# Patient Record
Sex: Female | Born: 1965 | Race: White | Hispanic: No | Marital: Married | Smoking: Current every day smoker
Health system: Southern US, Community
[De-identification: ages and names within clinical notes are randomized; demographics above are authoritative.]

## PROBLEM LIST (undated history)

## (undated) DIAGNOSIS — D123 Benign neoplasm of transverse colon: Secondary | ICD-10-CM

## (undated) DIAGNOSIS — F329 Major depressive disorder, single episode, unspecified: Secondary | ICD-10-CM

## (undated) DIAGNOSIS — Z87442 Personal history of urinary calculi: Secondary | ICD-10-CM

## (undated) DIAGNOSIS — C50511 Malignant neoplasm of lower-outer quadrant of right female breast: Secondary | ICD-10-CM

## (undated) DIAGNOSIS — K219 Gastro-esophageal reflux disease without esophagitis: Secondary | ICD-10-CM

## (undated) DIAGNOSIS — F32A Depression, unspecified: Secondary | ICD-10-CM

## (undated) DIAGNOSIS — Z803 Family history of malignant neoplasm of breast: Secondary | ICD-10-CM

## (undated) DIAGNOSIS — K635 Polyp of colon: Secondary | ICD-10-CM

## (undated) DIAGNOSIS — Z8481 Family history of carrier of genetic disease: Secondary | ICD-10-CM

## (undated) DIAGNOSIS — F17200 Nicotine dependence, unspecified, uncomplicated: Secondary | ICD-10-CM

## (undated) DIAGNOSIS — J302 Other seasonal allergic rhinitis: Secondary | ICD-10-CM

## (undated) DIAGNOSIS — C50919 Malignant neoplasm of unspecified site of unspecified female breast: Secondary | ICD-10-CM

## (undated) DIAGNOSIS — Z800 Family history of malignant neoplasm of digestive organs: Secondary | ICD-10-CM

## (undated) DIAGNOSIS — I10 Essential (primary) hypertension: Secondary | ICD-10-CM

## (undated) DIAGNOSIS — Z801 Family history of malignant neoplasm of trachea, bronchus and lung: Secondary | ICD-10-CM

## (undated) DIAGNOSIS — C4490 Unspecified malignant neoplasm of skin, unspecified: Secondary | ICD-10-CM

## (undated) DIAGNOSIS — D125 Benign neoplasm of sigmoid colon: Secondary | ICD-10-CM

## (undated) DIAGNOSIS — D649 Anemia, unspecified: Secondary | ICD-10-CM

## (undated) DIAGNOSIS — R7303 Prediabetes: Secondary | ICD-10-CM

## (undated) HISTORY — DX: Family history of malignant neoplasm of trachea, bronchus and lung: Z80.1

## (undated) HISTORY — DX: Polyp of colon: K63.5

## (undated) HISTORY — DX: Nicotine dependence, unspecified, uncomplicated: F17.200

## (undated) HISTORY — DX: Benign neoplasm of transverse colon: D12.3

## (undated) HISTORY — DX: Family history of carrier of genetic disease: Z84.81

## (undated) HISTORY — PX: WISDOM TOOTH EXTRACTION: SHX21

## (undated) HISTORY — DX: Family history of malignant neoplasm of breast: Z80.3

## (undated) HISTORY — DX: Family history of malignant neoplasm of digestive organs: Z80.0

## (undated) HISTORY — PX: COSMETIC SURGERY: SHX468

## (undated) HISTORY — DX: Unspecified malignant neoplasm of skin, unspecified: C44.90

## (undated) HISTORY — DX: Malignant neoplasm of unspecified site of unspecified female breast: C50.919

## (undated) HISTORY — DX: Benign neoplasm of sigmoid colon: D12.5

---
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
## 2006-11-20 ENCOUNTER — Ambulatory Visit: Payer: Self-pay | Admitting: Internal Medicine

## 2008-05-29 ENCOUNTER — Ambulatory Visit: Payer: Self-pay | Admitting: Internal Medicine

## 2009-03-04 ENCOUNTER — Ambulatory Visit: Payer: Self-pay | Admitting: Internal Medicine

## 2009-03-09 ENCOUNTER — Ambulatory Visit: Payer: Self-pay | Admitting: Internal Medicine

## 2010-07-28 ENCOUNTER — Ambulatory Visit: Payer: Self-pay | Admitting: Internal Medicine

## 2011-02-06 ENCOUNTER — Ambulatory Visit: Payer: Self-pay

## 2011-08-03 ENCOUNTER — Ambulatory Visit: Payer: Self-pay | Admitting: Internal Medicine

## 2011-08-03 LAB — URINALYSIS, COMPLETE
Bacteria: NEGATIVE
Bilirubin,UR: NEGATIVE
Glucose,UR: NEGATIVE mg/dL (ref 0–75)
Ketone: NEGATIVE
Nitrite: NEGATIVE
Ph: 6 (ref 4.5–8.0)
Protein: NEGATIVE
Specific Gravity: 1.005 (ref 1.003–1.030)

## 2011-08-05 LAB — URINE CULTURE

## 2011-09-07 DIAGNOSIS — Z8742 Personal history of other diseases of the female genital tract: Secondary | ICD-10-CM | POA: Insufficient documentation

## 2011-09-07 DIAGNOSIS — R87610 Atypical squamous cells of undetermined significance on cytologic smear of cervix (ASC-US): Secondary | ICD-10-CM | POA: Insufficient documentation

## 2011-09-07 HISTORY — DX: Personal history of other diseases of the female genital tract: Z87.42

## 2011-10-01 ENCOUNTER — Ambulatory Visit: Payer: Self-pay | Admitting: Internal Medicine

## 2012-07-19 ENCOUNTER — Ambulatory Visit: Payer: Self-pay

## 2012-07-19 LAB — URINALYSIS, COMPLETE
Bacteria: NEGATIVE
Bilirubin,UR: NEGATIVE
Glucose,UR: NEGATIVE mg/dL (ref 0–75)
Ketone: NEGATIVE
Leukocyte Esterase: NEGATIVE
Nitrite: NEGATIVE
Ph: 5 (ref 4.5–8.0)
Protein: NEGATIVE
Specific Gravity: 1.01 (ref 1.003–1.030)

## 2012-07-21 LAB — URINE CULTURE

## 2012-10-12 LAB — BASIC METABOLIC PANEL
BUN: 10 mg/dL (ref 4–21)
Creatinine: 0.8 mg/dL (ref <=1.1)

## 2012-10-12 LAB — LIPID PANEL
Cholesterol: 250 mg/dL — AB (ref 0–200)
HDL: 70 mg/dL (ref 35–70)
LDL Cholesterol: 149 mg/dL
Triglycerides: 155 mg/dL (ref 40–160)

## 2012-10-12 LAB — HM PAP SMEAR: HM Pap smear: NORMAL

## 2012-10-12 LAB — CBC AND DIFFERENTIAL: Hemoglobin: 15.8 g/dL (ref 12.0–16.0)

## 2012-10-12 LAB — TSH: TSH: 1.8 u[IU]/mL (ref <=5.90)

## 2012-10-21 ENCOUNTER — Ambulatory Visit: Payer: Self-pay | Admitting: Internal Medicine

## 2012-10-21 LAB — HM MAMMOGRAPHY: HM Mammogram: NORMAL

## 2013-02-16 ENCOUNTER — Ambulatory Visit: Payer: Self-pay | Admitting: Emergency Medicine

## 2014-09-20 ENCOUNTER — Other Ambulatory Visit: Payer: Self-pay | Admitting: Internal Medicine

## 2014-11-27 ENCOUNTER — Encounter: Payer: Self-pay | Admitting: Internal Medicine

## 2014-11-27 DIAGNOSIS — I10 Essential (primary) hypertension: Secondary | ICD-10-CM | POA: Insufficient documentation

## 2014-11-27 DIAGNOSIS — Z803 Family history of malignant neoplasm of breast: Secondary | ICD-10-CM | POA: Insufficient documentation

## 2014-11-27 DIAGNOSIS — F17200 Nicotine dependence, unspecified, uncomplicated: Secondary | ICD-10-CM | POA: Insufficient documentation

## 2014-12-08 ENCOUNTER — Encounter: Payer: Self-pay | Admitting: Internal Medicine

## 2014-12-20 ENCOUNTER — Ambulatory Visit (INDEPENDENT_AMBULATORY_CARE_PROVIDER_SITE_OTHER): Payer: BC Managed Care – PPO | Admitting: Internal Medicine

## 2014-12-20 ENCOUNTER — Encounter: Payer: Self-pay | Admitting: Internal Medicine

## 2014-12-20 VITALS — BP 126/80 | HR 80 | Ht 65.0 in | Wt 146.6 lb

## 2014-12-20 DIAGNOSIS — Z720 Tobacco use: Secondary | ICD-10-CM

## 2014-12-20 DIAGNOSIS — I10 Essential (primary) hypertension: Secondary | ICD-10-CM

## 2014-12-20 DIAGNOSIS — Z1211 Encounter for screening for malignant neoplasm of colon: Secondary | ICD-10-CM

## 2014-12-20 DIAGNOSIS — Z0000 Encounter for general adult medical examination without abnormal findings: Secondary | ICD-10-CM

## 2014-12-20 DIAGNOSIS — R87610 Atypical squamous cells of undetermined significance on cytologic smear of cervix (ASC-US): Secondary | ICD-10-CM

## 2014-12-20 DIAGNOSIS — Z23 Encounter for immunization: Secondary | ICD-10-CM | POA: Diagnosis not present

## 2014-12-20 DIAGNOSIS — Z1239 Encounter for other screening for malignant neoplasm of breast: Secondary | ICD-10-CM

## 2014-12-20 DIAGNOSIS — N951 Menopausal and female climacteric states: Secondary | ICD-10-CM | POA: Diagnosis not present

## 2014-12-20 LAB — POCT URINALYSIS DIPSTICK
Bilirubin, UA: NEGATIVE
Blood, UA: NEGATIVE
Glucose, UA: NEGATIVE
Ketones, UA: NEGATIVE
Leukocytes, UA: NEGATIVE
Nitrite, UA: NEGATIVE
Protein, UA: NEGATIVE
Spec Grav, UA: 1.01
Urobilinogen, UA: 0.2
pH, UA: 5

## 2014-12-20 MED ORDER — VARENICLINE TARTRATE 0.5 MG X 11 & 1 MG X 42 PO MISC: ORAL | Status: DC

## 2014-12-20 MED ORDER — LOSARTAN POTASSIUM-HCTZ 100-25 MG PO TABS: 1.0000 | ORAL_TABLET | Freq: Every day | ORAL | Status: DC

## 2014-12-20 NOTE — Progress Notes (Signed)
Date:  12/20/2014   Name:  Cheryl Mooney   DOB:  [birth year]   MRN:  [id number]   Chief Complaint: Annual Exam and Hypertension Cheryl Mooney is a 49 y.o. female who presents today for her Complete Annual Exam. She feels fairly well. She reports exercising with dance twice a week. She reports she is sleeping fairly well. She is awakened 3-4 times with sweats and has hot flashes during the day. She is due for a mammogram. She is also due for a colonoscopy.  Hypertension This is a chronic problem. The current episode started more than 1 year ago. The problem is unchanged. The problem is controlled. Pertinent negatives include no chest pain, headaches, palpitations or shortness of breath. There are no associated agents to hypertension. Past treatments include diuretics.   Tobacco use- patient is ready to quit smoking at this time. She's tried patches but they just on appeal to her. Several family members have had success with Chantix and she would like to try this. She smokes about 1 pack of cigarettes a day. She smokes less during the school year.  Menopausal symptoms patient is having fairly significant menopausal symptoms although she is not certain if these are lessening or worsening. Her last menses was last November. She does continue to smoke cigarettes. She's not interested in hormone replacement at this time due to family history.   Review of Systems:  Review of Systems  Constitutional: Negative for fever, diaphoresis and fatigue.  HENT: Negative for hearing loss, tinnitus and trouble swallowing.   Eyes: Negative for visual disturbance (on recent ophthalmology exam retinal changes consistent with hypertension were noted).  Respiratory: Negative for cough, choking and shortness of breath.   Cardiovascular: Negative for chest pain, palpitations and leg swelling.  Gastrointestinal: Negative for constipation, blood in stool and anal bleeding.  Genitourinary: Negative for hematuria,  vaginal bleeding, pelvic pain and dyspareunia.  Musculoskeletal: Negative for myalgias, joint swelling and gait problem.  Skin: Negative for color change and rash.  Neurological: Negative for light-headedness and headaches.  Hematological: Negative for adenopathy.  Psychiatric/Behavioral: Negative for hallucinations and dysphoric mood.    Patient Active Problem List   Diagnosis Date Noted  . Compulsive tobacco user syndrome 11/27/2014  . BP (high blood pressure) 11/27/2014  . Family history of cancer of digestive system 11/27/2014  . Family history of breast cancer 11/27/2014  . 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 Authorizing Provider  triamterene-hydrochlorothiazide (DYAZIDE) 37.5-25 MG per capsule TAKE ONE CAPSULE BY MOUTH ONCE DAILY 09/20/14  Yes Glean Hess, MD    No Known Allergies  No past surgical history on file.  Social History  Substance Use Topics  . Smoking status: Current Every Day Smoker  . Smokeless tobacco: None  . Alcohol Use: 1.2 oz/week    2 Standard drinks or equivalent per week     Medication list has been reviewed and updated.  Physical Examination:  Physical Exam  Constitutional: She is oriented to person, place, and time. She appears well-developed and well-nourished. No distress.  HENT:  Head: Normocephalic and atraumatic.  Right Ear: Tympanic membrane and ear canal normal.  Left Ear: Tympanic membrane and ear canal normal.  Nose: Nose normal.  Mouth/Throat: Uvula is midline and oropharynx is clear and moist.  Eyes: Conjunctivae are normal. Right eye exhibits no discharge. Left eye exhibits no discharge. No scleral icterus.  Neck: Normal range of motion. Neck supple. No JVD present. Carotid bruit is not present. No thyromegaly present.  Cardiovascular: Normal rate, regular rhythm, normal heart sounds, intact distal pulses and  normal pulses.   Pulmonary/Chest: Effort normal and breath sounds normal. No respiratory distress. She has no wheezes. Right breast exhibits no mass, no nipple discharge, no skin change and no tenderness. Left breast exhibits no mass, no nipple discharge, no skin change and no tenderness.  Abdominal: Soft. Normal appearance and bowel sounds are normal. There is no tenderness.  Musculoskeletal: Normal range of motion.       Right knee: Normal.       Left knee: Normal.  Lymphadenopathy:    She has no cervical adenopathy.  Neurological: She is alert and oriented to person, place, and time.  Skin: Skin is warm, dry and intact. No rash noted.  Psychiatric: She has a normal mood and affect. Her behavior is normal. Thought content normal. Cognition and memory are normal.  Nursing note and vitals reviewed.   BP 126/80 mmHg  Pulse 80  Ht 5\' 5"  (1.651 m)  Wt 146 lb 9.6 oz (66.497 kg)  BMI 24.40 kg/m2  Assessment and Plan: 1. Annual physical exam Recent urinary tract infection treated Prescription to receive Zostavax after 02/06/15 is given - POCT urinalysis dipstick  2. Essential hypertension Not as well-controlled with evidence of retinal changes We'll discontinue Dyazide and begin Hyzaar Follow-up in 3 months - losartan-hydrochlorothiazide (HYZAAR) 100-25 MG per tablet; Take 1 tablet by mouth daily.  Dispense: 90 tablet; Refill: 3 - CBC with Differential/Platelet - Comprehensive metabolic panel - Lipid panel  3. Breast cancer screening Refer for annual mammogram - MM DIGITAL SCREENING BILATERAL; Future  4. Menopausal syndrome Discussed therapy options. At this time will hold off on treatment; patient will monitor for worsening - TSH  5. Colon cancer screening - Ambulatory referral to Gastroenterology  6. Tobacco abuse Patient is eager to try Chantix prescription is given. She'll call for continuing month pack if beneficial - varenicline (CHANTIX STARTING MONTH PAK) 0.5 MG X 11  & 1 MG X 42 tablet; Take one 0.5 mg tablet by mouth once daily for 3 days, then increase to one 0.5 mg tablet twice daily for 4 days, then increase to one 1 mg tablet twice daily.  Dispense: 53 tablet; Refill: 0  7. Need for influenza vaccination - Flu Vaccine QUAD 36+ mos IM   Halina Maidens, MD [facility]  12/20/2014

## 2014-12-20 NOTE — Patient Instructions (Signed)

## 2014-12-28 ENCOUNTER — Telehealth: Payer: Self-pay | Admitting: Internal Medicine

## 2015-01-02 ENCOUNTER — Other Ambulatory Visit: Payer: Self-pay | Admitting: Internal Medicine

## 2015-01-02 DIAGNOSIS — E785 Hyperlipidemia, unspecified: Secondary | ICD-10-CM

## 2015-01-02 DIAGNOSIS — I10 Essential (primary) hypertension: Secondary | ICD-10-CM

## 2015-01-02 NOTE — Telephone Encounter (Signed)
Patient notified. She will return for labs.

## 2015-01-03 ENCOUNTER — Telehealth: Payer: Self-pay

## 2015-01-03 ENCOUNTER — Other Ambulatory Visit: Payer: Self-pay

## 2015-01-03 NOTE — Telephone Encounter (Signed)
Gastroenterology Pre-Procedure Review  Request Date: 02/17/15 Requesting Physician: Dr. Halina Maidens  PATIENT REVIEW QUESTIONS: The patient responded to the following health history questions as indicated:    1. Are you having any GI issues? no 2. Do you have a personal history of Polyps? no 3. Do you have a family history of Colon Cancer or Polyps? yes (Mother colon cancer) 4. Diabetes Mellitus? no 5. Joint replacements in the past 12 months?no 6. Major health problems in the past 3 months?no 7. Any artificial heart valves, MVP, or defibrillator?no    MEDICATIONS & ALLERGIES:    Patient reports the following regarding taking any anticoagulation/antiplatelet therapy:   Plavix, Coumadin, Eliquis, Xarelto, Lovenox, Pradaxa, Brilinta, or Effient? no Aspirin? no  Patient confirms/reports the following medications:  Current Outpatient Prescriptions  Medication Sig Dispense Refill  . losartan-hydrochlorothiazide (HYZAAR) 100-25 MG per tablet Take 1 tablet by mouth daily. 90 tablet 3  . varenicline (CHANTIX STARTING MONTH PAK) 0.5 MG X 11 & 1 MG X 42 tablet Take one 0.5 mg tablet by mouth once daily for 3 days, then increase to one 0.5 mg tablet twice daily for 4 days, then increase to one 1 mg tablet twice daily. 53 tablet 0   No current facility-administered medications for this visit.    Patient confirms/reports the following allergies:  No Known Allergies  No orders of the defined types were placed in this encounter.    AUTHORIZATION INFORMATION Primary Insurance: 1D#: Group #:  Secondary Insurance: 1D#: Group #:  SCHEDULE INFORMATION: Date: 02/17/15 Time: Location: [place]

## 2015-01-09 ENCOUNTER — Encounter: Payer: Self-pay | Admitting: Internal Medicine

## 2015-01-09 ENCOUNTER — Ambulatory Visit (INDEPENDENT_AMBULATORY_CARE_PROVIDER_SITE_OTHER): Payer: BC Managed Care – PPO | Admitting: Internal Medicine

## 2015-01-09 VITALS — BP 142/86 | HR 104 | Ht 65.0 in | Wt 145.8 lb

## 2015-01-09 DIAGNOSIS — F430 Acute stress reaction: Secondary | ICD-10-CM

## 2015-01-09 DIAGNOSIS — I10 Essential (primary) hypertension: Secondary | ICD-10-CM

## 2015-01-09 MED ORDER — CLONAZEPAM 0.5 MG PO TABS: 0.2500 mg | ORAL_TABLET | Freq: Three times a day (TID) | ORAL | Status: DC | PRN

## 2015-01-09 NOTE — Progress Notes (Signed)
Date:  01/09/2015   Name:  Cheryl Mooney   DOB:  [birth year]   MRN:  [id number]   Chief Complaint: Cough and Anxiety Cough This is a new problem. The current episode started 1 to 4 weeks ago. The problem has been gradually improving. The problem occurs hourly. The cough is non-productive. Pertinent negatives include no chest pain, chills, fever, shortness of breath or wheezing.  Anxiety Presents for initial visit. Onset was 1 to 4 weeks ago. The problem has been gradually worsening. Symptoms include nervous/anxious behavior and palpitations. Patient reports no chest pain, shortness of breath or suicidal ideas.    Patient reports a lot of anxiety dealing with her father's property and tax issues. She recently sold his house. Her main complaint is rapid heartbeat sensation of being very shaky and jittery inside. She is tearful at times as well. Doesn't feel that she is doing very well with the stress. She sleeps fairly well, her appetite is satisfactory, and she feels she is taking in sufficient fluids. Had a slight cough recently but that seems to be improved. She denies headaches dizziness suicidal thoughts or actual depression.   Review of Systems:  Review of Systems  Constitutional: Positive for fatigue. Negative for fever and chills.  Respiratory: Positive for cough. Negative for shortness of breath and wheezing.   Cardiovascular: Positive for palpitations. Negative for chest pain and leg swelling.  Gastrointestinal: Positive for abdominal pain.  Neurological: Negative for tremors.  Psychiatric/Behavioral: Positive for sleep disturbance and dysphoric mood. Negative for suicidal ideas. The patient is nervous/anxious.     Patient Active Problem List   Diagnosis Date Noted  . Compulsive tobacco user syndrome 11/27/2014  . BP (high blood pressure) 11/27/2014  . Family history of cancer of digestive system 11/27/2014  . Family history of breast cancer 11/27/2014  . Pap smear abnormality  of cervix with ASCUS favoring benign 09/07/2011    Prior to Admission medications   Medication Sig Start Date End Date Taking? Authorizing Provider  losartan-hydrochlorothiazide (HYZAAR) 100-25 MG per tablet Take 1 tablet by mouth daily. 12/20/14  Yes Glean Hess, MD  varenicline (CHANTIX STARTING MONTH PAK) 0.5 MG X 11 & 1 MG X 42 tablet Take one 0.5 mg tablet by mouth once daily for 3 days, then increase to one 0.5 mg tablet twice daily for 4 days, then increase to one 1 mg tablet twice daily. Patient not taking: Reported on 01/09/2015 12/20/14   Glean Hess, MD    No Known Allergies  No past surgical history on file.  Social History  Substance Use Topics  . Smoking status: Current Every Day Smoker  . Smokeless tobacco: None  . Alcohol Use: 1.2 oz/week    2 Standard drinks or equivalent per week     Medication list has been reviewed and updated.  Physical Examination:  Physical Exam  Constitutional: She is oriented to person, place, and time. She appears well-developed and well-nourished. No distress.  HENT:  Head: Normocephalic and atraumatic.  Eyes: Conjunctivae are normal. Right eye exhibits no discharge. Left eye exhibits no discharge. No scleral icterus.  Neck: Normal range of motion. Neck supple.  Cardiovascular: Regular rhythm and normal heart sounds.   No extrasystoles are present. Tachycardia present.   Pulmonary/Chest: Effort normal and breath sounds normal. No respiratory distress.  Musculoskeletal: Normal range of motion.  Neurological: She is alert and oriented to person, place, and time.  Skin: Skin is warm and dry. No rash noted.  Psychiatric:  Her speech is normal and behavior is normal. Thought content normal. Her mood appears anxious (and tearful).  Nursing note and vitals reviewed.   BP 142/86 mmHg  Pulse 104  Ht 5\' 5"  (1.651 m)  Wt 145 lb 12.8 oz (66.134 kg)  BMI 24.26 kg/m2  Assessment and Plan: 1. Acute reaction to stress Expect  improvement over the next several weeks Patient may use clonazepam 1/2-1 tablet 3 times a day as needed - clonazePAM (KLONOPIN) 0.5 MG tablet; Take 0.5 tablets (0.25 mg total) by mouth 3 (three) times daily as needed for anxiety.  Dispense: 30 tablet; Refill: 0  2. Essential hypertension Somewhat improved Continue losartan HCT   Halina Maidens, MD [facility]  01/09/2015

## 2015-01-10 LAB — COMPREHENSIVE METABOLIC PANEL
ALT: 13 IU/L (ref 0–32)
AST: 17 IU/L (ref 0–40)
Albumin/Globulin Ratio: 1.9 (ref 1.1–2.5)
Albumin: 5 g/dL (ref 3.5–5.5)
Alkaline Phosphatase: 103 IU/L (ref 39–117)
BUN/Creatinine Ratio: 13 (ref 9–23)
BUN: 10 mg/dL (ref 6–24)
Bilirubin Total: 0.4 mg/dL (ref 0.0–1.2)
CO2: 23 mmol/L (ref 18–29)
Calcium: 10.3 mg/dL — ABNORMAL HIGH (ref 8.7–10.2)
Chloride: 97 mmol/L (ref 97–108)
Creatinine, Ser: 0.79 mg/dL (ref 0.57–1.00)
GFR calc Af Amer: 100 mL/min/{1.73_m2} (ref >59)
GFR calc non Af Amer: 87 mL/min/{1.73_m2} (ref >59)
Globulin, Total: 2.7 g/dL (ref 1.5–4.5)
Glucose: 145 mg/dL — ABNORMAL HIGH (ref 65–99)
Potassium: 3.9 mmol/L (ref 3.5–5.2)
Sodium: 139 mmol/L (ref 134–144)
Total Protein: 7.7 g/dL (ref 6.0–8.5)

## 2015-01-10 LAB — LIPID PANEL
Chol/HDL Ratio: 4 ratio units (ref 0.0–4.4)
Cholesterol, Total: 246 mg/dL — ABNORMAL HIGH (ref 100–199)
HDL: 61 mg/dL (ref >39)
LDL Calculated: 162 mg/dL — ABNORMAL HIGH (ref 0–99)
Triglycerides: 115 mg/dL (ref 0–149)
VLDL Cholesterol Cal: 23 mg/dL (ref 5–40)

## 2015-01-10 LAB — CBC WITH DIFFERENTIAL/PLATELET
Basophils Absolute: 0 10*3/uL (ref 0.0–0.2)
Basos: 0 %
EOS (ABSOLUTE): 0.2 10*3/uL (ref 0.0–0.4)
Eos: 2 %
Hematocrit: 44.8 % (ref 34.0–46.6)
Hemoglobin: 15.2 g/dL (ref 11.1–15.9)
Immature Grans (Abs): 0 10*3/uL (ref 0.0–0.1)
Immature Granulocytes: 0 %
Lymphocytes Absolute: 2.8 10*3/uL (ref 0.7–3.1)
Lymphs: 32 %
MCH: 30.5 pg (ref 26.6–33.0)
MCHC: 33.9 g/dL (ref 31.5–35.7)
MCV: 90 fL (ref 79–97)
Monocytes Absolute: 0.5 10*3/uL (ref 0.1–0.9)
Monocytes: 6 %
Neutrophils Absolute: 5.4 10*3/uL (ref 1.4–7.0)
Neutrophils: 60 %
Platelets: 377 10*3/uL (ref 150–379)
RBC: 4.98 x10E6/uL (ref 3.77–5.28)
RDW: 13.7 % (ref 12.3–15.4)
WBC: 9 10*3/uL (ref 3.4–10.8)

## 2015-01-10 LAB — TSH: TSH: 1.39 u[IU]/mL (ref 0.450–4.500)

## 2015-01-12 ENCOUNTER — Encounter: Payer: Self-pay | Admitting: *Deleted

## 2015-01-12 ENCOUNTER — Ambulatory Visit
Admission: EM | Admit: 2015-01-12 | Discharge: 2015-01-12 | Disposition: A | Discharge status: Home / Self Care | Payer: BC Managed Care – PPO | Attending: Family Medicine | Admitting: Family Medicine

## 2015-01-12 DIAGNOSIS — F430 Acute stress reaction: Secondary | ICD-10-CM | POA: Diagnosis not present

## 2015-01-12 HISTORY — DX: Essential (primary) hypertension: I10

## 2015-01-12 LAB — URINALYSIS COMPLETE WITH MICROSCOPIC (ARMC ONLY)
Bacteria, UA: NONE SEEN — AB
Bilirubin Urine: NEGATIVE
Glucose, UA: NEGATIVE mg/dL
Ketones, ur: NEGATIVE mg/dL
Leukocytes, UA: NEGATIVE
Nitrite: NEGATIVE
Protein, ur: NEGATIVE mg/dL
RBC / HPF: NONE SEEN RBC/hpf (ref <3)
Specific Gravity, Urine: 1.01 (ref 1.005–1.030)
WBC, UA: NONE SEEN WBC/hpf (ref <3)
pH: 7 (ref 5.0–8.0)

## 2015-01-12 NOTE — Discharge Instructions (Signed)
Stress and Stress Management °Stress is a normal reaction to life events. It is what you feel when life demands more than you are used to or more than you can handle. Some stress can be useful. For example, the stress reaction can help you catch the last bus of the day, study for a test, or meet a deadline at work. But stress that occurs too often or for too long can cause problems. It can affect your emotional health and interfere with relationships and normal daily activities. Too much stress can weaken your immune system and increase your risk for physical illness. If you already have a medical problem, stress can make it worse. °CAUSES  °All sorts of life events may cause stress. An event that causes stress for one person may not be stressful for another person. Major life events commonly cause stress. These may be positive or negative. Examples include losing your job, moving into a new home, getting married, having a baby, or losing a loved one. Less obvious life events may also cause stress, especially if they occur day after day or in combination. Examples include working long hours, driving in traffic, caring for children, being in debt, or being in a difficult relationship. °SIGNS AND SYMPTOMS °Stress may cause emotional symptoms including, the following: °· Anxiety. This is feeling worried, afraid, on edge, overwhelmed, or out of control. °· Anger. This is feeling irritated or impatient. °· Depression. This is feeling sad, down, helpless, or guilty. °· Difficulty focusing, remembering, or making decisions. °Stress may cause physical symptoms, including the following:  °· Aches and pains. These may affect your head, neck, back, stomach, or other areas of your body. °· Tight muscles or clenched jaw. °· Low energy or trouble sleeping.  °Stress may cause unhealthy behaviors, including the following:  °· Eating to feel better (overeating) or skipping meals. °· Sleeping too little, too much, or both. °· Working  too much or putting off tasks (procrastination). °· Smoking, drinking alcohol, or using drugs to feel better. °DIAGNOSIS  °Stress is diagnosed through an assessment by your health care provider. Your health care provider will ask questions about your symptoms and any stressful life events. Your health care provider will also ask about your medical history and may order blood tests or other tests. Certain medical conditions and medicine can cause physical symptoms similar to stress.  Mental illness can cause emotional symptoms and unhealthy behaviors similar to stress. Your health care provider may refer you to a mental health professional for further evaluation.  °TREATMENT  °Stress management is the recommended treatment for stress. The goals of stress management are reducing stressful life events and coping with stress in healthy ways.  °Techniques for reducing stressful life events include the following: °· Stress identification. Self-monitor for stress and identify what causes stress for you. These skills may help you to avoid some stressful events. °· Time management. Set your priorities, keep a calendar of events, and learn to say "no." These tools can help you avoid making too many commitments. °Techniques for coping with stress include the following: °· Rethinking the problem. Try to think realistically about stressful events rather than ignoring them or overreacting. Try to find the positives in a stressful situation rather than focusing on the negatives. °· Exercise. Physical exercise can release both physical and emotional tension. The key is to find a form of exercise you enjoy and do it regularly. °· Relaxation techniques. These relax the body and mind. Examples include yoga, meditation, tai chi, biofeedback, deep   breathing, progressive muscle relaxation, listening to music, being out in nature, journaling, and other hobbies. Again, the key is to find one or more that you enjoy and can do  regularly.  Healthy lifestyle. Eat a balanced diet, get plenty of sleep, and do not smoke. Avoid using alcohol or drugs to relax.  Strong support network. Spend time with family, friends, or other people you enjoy being around.Express your feelings and talk things over with someone you trust. Counseling or talktherapy with a mental health professional may be helpful if you are having difficulty managing stress on your own. Medicine is typically not recommended for the treatment of stress.Talk to your health care provider if you think you need medicine for symptoms of stress. HOME CARE INSTRUCTIONS  Keep all follow-up visits as directed by your health care provider.  Take all medicines as directed by your health care provider. SEEK MEDICAL CARE IF:  Your symptoms get worse or you start having new symptoms.  You feel overwhelmed by your problems and can no longer manage them on your own. SEEK IMMEDIATE MEDICAL CARE IF:  You feel like hurting yourself or someone else.   This information is not intended to replace advice given to you by your health care provider. Make sure you discuss any questions you have with your health care provider.   Document Released: 09/18/2000 Document Revised: 04/15/2014 Document Reviewed: 11/17/2012 Elsevier Interactive Patient Education [employer].

## 2015-01-12 NOTE — ED Provider Notes (Signed)
CSN: [id number]     Arrival date & time 01/12/15  1840 History   None    Chief Complaint  Patient presents with  . Urinary Frequency   (Consider location/radiation/quality/duration/timing/severity/associated sxs/prior Treatment) HPI   49 year old female who presents today because of possible UTI. He states that 6 days ago she started to experience urinary frequency in satiety or pain and achiness on the left back. States that she does not have any dysuria and has no vaginal discharge. She is in A monogamous relationship. She relates is under a great deal of stress lately because of an estate settlement. She was seen by Dr. Army Melia on 10 3 because of a stress disorder and placed on Klonopin. She has been drinking cranberry juice to help with her symptoms. Denies any fever or chills.   Past Medical History  Diagnosis Date  . Hypertension    History reviewed. No pertinent past surgical history. Family History  Problem Relation Age of Onset  . Colon cancer Mother 18  . Breast cancer Mother   . Hypertension Father   . Stroke Father    Social History  Substance Use Topics  . Smoking status: Current Every Day Smoker  . Smokeless tobacco: None  . Alcohol Use: 1.2 oz/week    2 Standard drinks or equivalent per week   OB History    No data available     Review of Systems  Constitutional: Negative for fever, chills, diaphoresis and fatigue.  Genitourinary: Positive for urgency and frequency. Negative for dysuria.  All other systems reviewed and are negative.   Allergies  Review of patient's allergies indicates no known allergies.  Home Medications   Prior to Admission medications   Medication Sig Start Date End Date Taking? Authorizing Provider  losartan-hydrochlorothiazide (HYZAAR) 100-25 MG per tablet Take 1 tablet by mouth daily. 12/20/14  Yes Glean Hess, MD  clonazePAM (KLONOPIN) 0.5 MG tablet Take 0.5 tablets (0.25 mg total) by mouth 3 (three) times daily as needed  for anxiety. 01/09/15   Glean Hess, MD  varenicline (CHANTIX STARTING MONTH PAK) 0.5 MG X 11 & 1 MG X 42 tablet Take one 0.5 mg tablet by mouth once daily for 3 days, then increase to one 0.5 mg tablet twice daily for 4 days, then increase to one 1 mg tablet twice daily. Patient not taking: Reported on 01/09/2015 12/20/14   Glean Hess, MD   Meds Ordered and Administered this Visit  Medications - No data to display  BP 154/89 mmHg  Pulse 99  Temp(Src) 98.1 F (36.7 C) (Oral)  Ht 5' 4.5" (1.638 m)  Wt 145 lb (65.772 kg)  BMI 24.51 kg/m2  SpO2 100% No data found.   Physical Exam  Constitutional: She is oriented to person, place, and time. She appears well-developed and well-nourished. No distress.  HENT:  Head: Normocephalic and atraumatic.  Eyes: Pupils are equal, round, and reactive to light.  Musculoskeletal: Normal range of motion. She exhibits no edema or tenderness.  Neurological: She is alert and oriented to person, place, and time.  Skin: Skin is warm and dry. She is not diaphoretic.  Psychiatric: She has a normal mood and affect. Her behavior is normal. Judgment and thought content normal.  Nursing note and vitals reviewed.   ED Course  Procedures (including critical care time)  Labs Review Labs Reviewed  URINALYSIS COMPLETEWITH MICROSCOPIC ([place] ONLY) - Abnormal; Notable for the following:    Color, Urine COLORLESS (*)    Hgb  urine dipstick TRACE (*)    Bacteria, UA NONE SEEN (*)    Squamous Epithelial / LPF 0-5 (*)    All other components within normal limits    Imaging Review No results found.   Visual Acuity Review  Right Eye Distance:   Left Eye Distance:   Bilateral Distance:    Right Eye Near:   Left Eye Near:    Bilateral Near:         MDM   1. Reaction, stress, acute    Plan: 1. Diagnosis reviewed with patient 2. rx as per orders; risks, benefits, potential side effects reviewed with patient 3. Recommend supportive treatment  with Fluids. 4. F/u Call 48 hours for results of urine culture. Follow up with Dr. Army Melia.  Spoke at length with the patient and she does not have a urinary tract infection by our UA today but I will submit a urine culture to be certain. She Is concerned this may be a brought on by stress which possibly could be. I told her that it could also stem from a vaginal source such as an infection. She would rather follow up with Dr. Army Melia if the UA culture was negative. She has seen Dr. Army Melia 3 days ago and was offered Klonopin but she is only taken a few at this point.  Lorin Picket, PA-C 01/12/15 2004  Lorin Picket, PA-C 01/12/15 2101

## 2015-01-12 NOTE — ED Notes (Signed)
Pt states that she started having urinary frequency that started 6 days ago, has been drinking cranberry juice to help symptoms.

## 2015-02-13 ENCOUNTER — Encounter: Payer: Self-pay | Admitting: *Deleted

## 2015-02-14 ENCOUNTER — Encounter: Payer: Self-pay | Admitting: Anesthesiology

## 2015-02-16 NOTE — Discharge Instructions (Signed)

## 2015-02-17 ENCOUNTER — Ambulatory Visit: Payer: BC Managed Care – PPO | Admitting: Anesthesiology

## 2015-02-17 ENCOUNTER — Ambulatory Visit
Admission: RE | Admit: 2015-02-17 | Discharge: 2015-02-17 | Disposition: A | Discharge status: Home / Self Care | Payer: BC Managed Care – PPO | Source: Ambulatory Visit | Attending: Gastroenterology | Admitting: Gastroenterology

## 2015-02-17 ENCOUNTER — Encounter
Admission: RE | Disposition: A | Discharge status: Home / Self Care | Payer: Self-pay | Source: Ambulatory Visit | Attending: Gastroenterology

## 2015-02-17 ENCOUNTER — Encounter: Payer: Self-pay | Admitting: *Deleted

## 2015-02-17 ENCOUNTER — Other Ambulatory Visit: Payer: Self-pay | Admitting: Gastroenterology

## 2015-02-17 DIAGNOSIS — F17200 Nicotine dependence, unspecified, uncomplicated: Secondary | ICD-10-CM | POA: Diagnosis not present

## 2015-02-17 DIAGNOSIS — D123 Benign neoplasm of transverse colon: Secondary | ICD-10-CM | POA: Diagnosis not present

## 2015-02-17 DIAGNOSIS — D122 Benign neoplasm of ascending colon: Secondary | ICD-10-CM | POA: Diagnosis not present

## 2015-02-17 DIAGNOSIS — D125 Benign neoplasm of sigmoid colon: Secondary | ICD-10-CM | POA: Insufficient documentation

## 2015-02-17 DIAGNOSIS — Z803 Family history of malignant neoplasm of breast: Secondary | ICD-10-CM | POA: Insufficient documentation

## 2015-02-17 DIAGNOSIS — Z1211 Encounter for screening for malignant neoplasm of colon: Secondary | ICD-10-CM | POA: Diagnosis not present

## 2015-02-17 DIAGNOSIS — K635 Polyp of colon: Secondary | ICD-10-CM | POA: Diagnosis not present

## 2015-02-17 DIAGNOSIS — Z800 Family history of malignant neoplasm of digestive organs: Secondary | ICD-10-CM | POA: Diagnosis not present

## 2015-02-17 DIAGNOSIS — Z9889 Other specified postprocedural states: Secondary | ICD-10-CM | POA: Diagnosis not present

## 2015-02-17 DIAGNOSIS — Z79899 Other long term (current) drug therapy: Secondary | ICD-10-CM | POA: Insufficient documentation

## 2015-02-17 DIAGNOSIS — Z823 Family history of stroke: Secondary | ICD-10-CM | POA: Diagnosis not present

## 2015-02-17 DIAGNOSIS — I10 Essential (primary) hypertension: Secondary | ICD-10-CM | POA: Insufficient documentation

## 2015-02-17 DIAGNOSIS — Z8249 Family history of ischemic heart disease and other diseases of the circulatory system: Secondary | ICD-10-CM | POA: Diagnosis not present

## 2015-02-17 HISTORY — PX: COLONOSCOPY WITH PROPOFOL: SHX5780

## 2015-02-17 HISTORY — PX: POLYPECTOMY: SHX5525

## 2015-02-17 SURGERY — COLONOSCOPY WITH PROPOFOL
Anesthesia: Monitor Anesthesia Care | Wound class: Contaminated

## 2015-02-17 MED ORDER — LACTATED RINGERS IV SOLN
INTRAVENOUS | Status: DC
  Administered 2015-02-17: 09:00:00 via INTRAVENOUS

## 2015-02-17 MED ORDER — SODIUM CHLORIDE 0.9 % IV SOLN: INTRAVENOUS | Status: DC

## 2015-02-17 MED ORDER — PROPOFOL 10 MG/ML IV BOLUS
INTRAVENOUS | Status: DC | PRN
  Administered 2015-02-17: 50 mg via INTRAVENOUS
  Administered 2015-02-17: 100 mg via INTRAVENOUS
  Administered 2015-02-17: 30 mg via INTRAVENOUS
  Administered 2015-02-17: 20 mg via INTRAVENOUS
  Administered 2015-02-17: 40 mg via INTRAVENOUS
  Administered 2015-02-17: 20 mg via INTRAVENOUS
  Administered 2015-02-17: 50 mg via INTRAVENOUS

## 2015-02-17 MED ORDER — STERILE WATER FOR IRRIGATION IR SOLN
Status: DC | PRN
  Administered 2015-02-17: 09:00:00

## 2015-02-17 MED ORDER — LIDOCAINE HCL (CARDIAC) 20 MG/ML IV SOLN
INTRAVENOUS | Status: DC | PRN
  Administered 2015-02-17: 30 mg via INTRAVENOUS

## 2015-02-17 SURGICAL SUPPLY — 29 items
CANISTER SUCT 1200ML W/VALVE (MISCELLANEOUS) ×3 IMPLANT
FCP ESCP3.2XJMB 240X2.8X (MISCELLANEOUS) ×2
FORCEPS BIOP RAD 4 LRG CAP 4 (CUTTING FORCEPS) IMPLANT
FORCEPS BIOP RJ4 240 W/NDL (MISCELLANEOUS) ×1
FORCEPS ESCP3.2XJMB 240X2.8X (MISCELLANEOUS) ×2 IMPLANT
GOWN CVR UNV OPN BCK APRN NK (MISCELLANEOUS) ×4 IMPLANT
GOWN ISOL THUMB LOOP REG UNIV (MISCELLANEOUS) ×2
HEMOCLIP INSTINCT (CLIP) IMPLANT
INJECTOR VARIJECT VIN23 (MISCELLANEOUS) IMPLANT
KIT CO2 TUBING (TUBING) IMPLANT
KIT DEFENDO VALVE AND CONN (KITS) IMPLANT
KIT ENDO PROCEDURE OLY (KITS) ×3 IMPLANT
KIT ROOM TURNOVER OR (KITS) ×3 IMPLANT
LIGATOR MULTIBAND 6SHOOTER MBL (MISCELLANEOUS) IMPLANT
MARKER SPOT ENDO TATTOO 5ML (MISCELLANEOUS) IMPLANT
PAD GROUND ADULT SPLIT (MISCELLANEOUS) IMPLANT
SNARE SHORT THROW 13M SML OVAL (MISCELLANEOUS) ×3 IMPLANT
SNARE SHORT THROW 30M LRG OVAL (MISCELLANEOUS) IMPLANT
SPOT EX ENDOSCOPIC TATTOO (MISCELLANEOUS)
SUCTION POLY TRAP 4CHAMBER (MISCELLANEOUS) IMPLANT
TRAP SUCTION POLY (MISCELLANEOUS) ×3 IMPLANT
TUBING CONN 6MMX3.1M (TUBING)
TUBING SUCTION CONN 0.25 STRL (TUBING) IMPLANT
UNDERPAD 30X60 958B10 (PK) (MISCELLANEOUS) IMPLANT
VALVE BIOPSY ENDO (VALVE) IMPLANT
VARIJECT INJECTOR VIN23 (MISCELLANEOUS)
WATER AUXILLARY (MISCELLANEOUS) IMPLANT
WATER STERILE IRR 250ML POUR (IV SOLUTION) ×3 IMPLANT
WATER STERILE IRR 500ML POUR (IV SOLUTION) IMPLANT

## 2015-02-17 NOTE — Anesthesia Procedure Notes (Signed)
Procedure Name: MAC Performed by: Tommie Bohlken Pre-anesthesia Checklist: Patient identified, Emergency Drugs available, Suction available, Patient being monitored and Timeout performed Patient Re-evaluated:Patient Re-evaluated prior to inductionOxygen Delivery Method: Nasal cannula       

## 2015-02-17 NOTE — Anesthesia Postprocedure Evaluation (Signed)
  Anesthesia Post-op Note  Patient: [name]  Procedure(s) Performed: Procedure(s): COLONOSCOPY WITH PROPOFOL (N/A) POLYPECTOMY  Anesthesia type:MAC  Patient location: PACU  Post pain: Pain level controlled  Post assessment: Post-op Vital signs reviewed, Patient's Cardiovascular Status Stable, Respiratory Function Stable, Patent Airway and No signs of Nausea or vomiting  Post vital signs: Reviewed and stable  Last Vitals:  Filed Vitals:   02/17/15 0945  BP: 98/69  Pulse: 72  Temp: 36.5 C  Resp: 15    Level of consciousness: awake, alert  and patient cooperative  Complications: No apparent anesthesia complications

## 2015-02-17 NOTE — Anesthesia Preprocedure Evaluation (Signed)
Anesthesia Evaluation  Patient identified by MRN, date of birth, ID band Patient awake    Reviewed: Allergy & Precautions, H&P , NPO status , Patient's Chart, lab work & pertinent test results, reviewed documented beta blocker date and time   Airway Mallampati: II  TM Distance: >3 FB Neck ROM: full    Dental no notable dental hx.    Pulmonary Current Smoker,    Pulmonary exam normal breath sounds clear to auscultation       Cardiovascular Exercise Tolerance: Good hypertension,  Rhythm:regular Rate:Normal     Neuro/Psych negative neurological ROS  negative psych ROS   GI/Hepatic negative GI ROS, Neg liver ROS,   Endo/Other  negative endocrine ROS  Renal/GU negative Renal ROS  negative genitourinary   Musculoskeletal   Abdominal   Peds  Hematology negative hematology ROS (+)   Anesthesia Other Findings   Reproductive/Obstetrics negative OB ROS                             Anesthesia Physical Anesthesia Plan  ASA: II  Anesthesia Plan: MAC   Post-op Pain Management:    Induction:   Airway Management Planned:   Additional Equipment:   Intra-op Plan:   Post-operative Plan:   Informed Consent: I have reviewed the patients History and Physical, chart, labs and discussed the procedure including the risks, benefits and alternatives for the proposed anesthesia with the patient or authorized representative who has indicated his/her understanding and acceptance.   Dental Advisory Given  Plan Discussed with: CRNA  Anesthesia Plan Comments:         Anesthesia Quick Evaluation

## 2015-02-17 NOTE — H&P (Signed)
  [facility]  [address], [place], [place] [address] Phone: [phone] Fax : [phone]  Primary Care Physician:  Halina Maidens, MD Primary Gastroenterologist:  Dr. Allen Norris  Pre-Procedure History & Physical: HPI:  Cheryl Mooney is a 49 y.o. female is here for a screening colonoscopy.   Past Medical History  Diagnosis Date  . Hypertension     Past Surgical History  Procedure Laterality Date  . Wisdom tooth extraction      Prior to Admission medications   Medication Sig Start Date End Date Taking? Authorizing Provider  clonazePAM (KLONOPIN) 0.5 MG tablet Take 0.5 tablets (0.25 mg total) by mouth 3 (three) times daily as needed for anxiety. 01/09/15   Glean Hess, MD  losartan-hydrochlorothiazide (HYZAAR) 100-25 MG per tablet Take 1 tablet by mouth daily. 12/20/14   Glean Hess, MD  varenicline (CHANTIX STARTING MONTH PAK) 0.5 MG X 11 & 1 MG X 42 tablet Take one 0.5 mg tablet by mouth once daily for 3 days, then increase to one 0.5 mg tablet twice daily for 4 days, then increase to one 1 mg tablet twice daily. Patient not taking: Reported on 01/09/2015 12/20/14   Glean Hess, MD    Allergies as of 01/03/2015  . (No Known Allergies)    Family History  Problem Relation Age of Onset  . Colon cancer Mother 49  . Breast cancer Mother   . Hypertension Father   . Stroke Father     Social History   Social History  . Marital Status: Married    Spouse Name: N/A  . Number of Children: N/A  . Years of Education: N/A   Occupational History  . Not on file.   Social History Main Topics  . Smoking status: Current Every Day Smoker -- 1.00 packs/day for 20 years  . Smokeless tobacco: Not on file  . Alcohol Use: 1.2 oz/week    2 Standard drinks or equivalent per week  . Drug Use: No  . Sexual Activity: Not on file   Other Topics Concern  . Not on file   Social History Narrative    Review of Systems: See HPI, otherwise negative  ROS  Physical Exam: BP 131/96 mmHg  Pulse 83  Temp(Src) 98.2 F (36.8 C) (Temporal)  Resp 14  Ht 5' 4.5" (1.638 m)  Wt 145 lb (65.772 kg)  BMI 24.51 kg/m2  SpO2 98%  LMP  General:   Alert,  pleasant and cooperative in NAD Head:  Normocephalic and atraumatic. Neck:  Supple; no masses or thyromegaly. Lungs:  Clear throughout to auscultation.    Heart:  Regular rate and rhythm. Abdomen:  Soft, nontender and nondistended. Normal bowel sounds, without guarding, and without rebound.   Neurologic:  Alert and  oriented x4;  grossly normal neurologically.  Impression/Plan: Cheryl Mooney is now here to undergo a screening colonoscopy.  Risks, benefits, and alternatives regarding colonoscopy have been reviewed with the patient.  Questions have been answered.  All parties agreeable.

## 2015-02-17 NOTE — Transfer of Care (Signed)
Immediate Anesthesia Transfer of Care Note  Patient: Cheryl Mooney  Procedure(s) Performed: Procedure(s): COLONOSCOPY WITH PROPOFOL (N/A)  Patient Location: PACU  Anesthesia Type: MAC  Level of Consciousness: awake, alert  and patient cooperative  Airway and Oxygen Therapy: Patient Spontanous Breathing and Patient connected to supplemental oxygen  Post-op Assessment: Post-op Vital signs reviewed, Patient's Cardiovascular Status Stable, Respiratory Function Stable, Patent Airway and No signs of Nausea or vomiting  Post-op Vital Signs: Reviewed and stable  Complications: No apparent anesthesia complications

## 2015-02-17 NOTE — Op Note (Signed)
[facility]astro[facility] Patient Name: Cheryl Mooney Procedure Date: 02/17/2015 9:14 AM MRN: [id number] Account #: [id number] Date of Birth: 08/21/1965 Admit Type: Outpatient Age: 49 Room: [facility] OR ROOM 01 Gender: Female Note Status: Finalized Procedure:         Colonoscopy Indications:       Screening for colorectal malignant neoplasm Providers:         Lucilla Lame, MD Referring MD:      Halina Maidens, MD (Referring MD) Medicines:         Propofol per Anesthesia Complications:     No immediate complications. Procedure:         Pre-Anesthesia Assessment:                    - Prior to the procedure, a History and Physical was                     performed, and patient medications and allergies were                     reviewed. The patient's tolerance of previous anesthesia                     was also reviewed. The risks and benefits of the procedure                     and the sedation options and risks were discussed with the                     patient. All questions were answered, and informed consent                     was obtained. Prior Anticoagulants: The patient has taken                     no previous anticoagulant or antiplatelet agents. ASA                     Grade Assessment: II - A patient with mild systemic                     disease. After reviewing the risks and benefits, the                     patient was deemed in satisfactory condition to undergo                     the procedure.                    After obtaining informed consent, the colonoscope was                     passed under direct vision. Throughout the procedure, the                     patient's blood pressure, pulse, and oxygen saturations                     were monitored continuously. The Olympus CF-HQ190L                     Colonoscope (S#. [id number]) was introduced through the anus  and advanced to the the cecum, identified by appendiceal               orifice and ileocecal valve. The colonoscopy was performed                     without difficulty. The patient tolerated the procedure                     well. The quality of the bowel preparation was excellent. Findings:      The perianal and digital rectal examinations were normal.      A 6 mm polyp was found in the ascending colon. The polyp was sessile.       The polyp was removed with a cold snare. Resection and retrieval were       complete.      Three sessile polyps were found in the sigmoid colon. The polyps were 5       to 7 mm in size. These polyps were removed with a cold snare. Resection       and retrieval were complete.      A 3 mm polyp was found in the sigmoid colon. The polyp was sessile. The       polyp was removed with a cold biopsy forceps. Resection and retrieval       were complete. Impression:        - One 6 mm polyp in the ascending colon. Resected and                     retrieved.                    - Three 5 to 7 mm polyps in the sigmoid colon. Resected                     and retrieved.                    - One 3 mm polyp in the sigmoid colon. Resected and                     retrieved. Recommendation:    - Await pathology results.                    - Repeat colonoscopy in 5 years if polyp adenoma and 10                     years if hyperplastic Procedure Code(s): --- Professional ---                    [phone], Colonoscopy, flexible; with removal of tumor(s),                     polyp(s), or other lesion(s) by snare technique                    45380, 74, Colonoscopy, flexible; with biopsy, single or                     multiple Diagnosis Code(s): --- Professional ---                    Z12.11, Encounter for screening for malignant neoplasm of  colon                    D12.3, Benign neoplasm of transverse colon                    D12.5, Benign neoplasm of sigmoid colon CPT copyright 2014 American Medical Association. All  rights reserved. The codes documented in this report are preliminary and upon coder review may  be revised to meet current compliance requirements. Lucilla Lame, MD 02/17/2015 9:39:05 AM This report has been signed electronically. Number of Addenda: 0 Note Initiated On: 02/17/2015 9:14 AM Scope Withdrawal Time: 0 hours 11 minutes 20 seconds  Total Procedure Duration: 0 hours 17 minutes 4 seconds       [facility]

## 2015-02-20 ENCOUNTER — Encounter: Payer: Self-pay | Admitting: Gastroenterology

## 2015-02-21 ENCOUNTER — Encounter: Payer: Self-pay | Admitting: Internal Medicine

## 2015-02-21 DIAGNOSIS — D126 Benign neoplasm of colon, unspecified: Secondary | ICD-10-CM | POA: Insufficient documentation

## 2015-02-26 ENCOUNTER — Ambulatory Visit
Admission: EM | Admit: 2015-02-26 | Discharge: 2015-02-26 | Disposition: A | Discharge status: Home / Self Care | Payer: BC Managed Care – PPO | Attending: Family Medicine | Admitting: Family Medicine

## 2015-02-26 DIAGNOSIS — J0100 Acute maxillary sinusitis, unspecified: Secondary | ICD-10-CM

## 2015-02-26 MED ORDER — AMOXICILLIN-POT CLAVULANATE 875-125 MG PO TABS: 1.0000 | ORAL_TABLET | Freq: Two times a day (BID) | ORAL | Status: AC

## 2015-02-26 NOTE — ED Provider Notes (Signed)
CSN: [id number]     Arrival date & time 02/26/15  1441 History   First MD Initiated Contact with Patient 02/26/15 1615     Chief Complaint  Patient presents with  . Sinusitis    Pt with green nasal drainage and coughing up green phlegm tinged with blood. Subjective fever on Friday but none since. Right neck swollen glands. Pain 5/10 right neck. Drainage down back of throat. Feels fatigued.    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 Believes fever intermittent. Post nasal drip and evening cough present during well days prior -.  Review reveals distant hx of flonase/claritin therapy years ago which she has not continued-  Past Medical History  Diagnosis Date  . Hypertension    Past Surgical History  Procedure Laterality Date  . Wisdom tooth extraction    . Colonoscopy with propofol N/A 02/17/2015    Procedure: COLONOSCOPY WITH PROPOFOL;  Surgeon: Lucilla Lame, MD;  Location: [place];  Service: Endoscopy;  Laterality: N/A;  . Polypectomy  02/17/2015    Procedure: POLYPECTOMY;  Surgeon: Lucilla Lame, MD;  Location: [place];  Service: Endoscopy;;   Family History  Problem Relation Age of Onset  . Colon cancer Mother 12  . Breast cancer Mother   . Hypertension Father   . Stroke Father    Social History  Substance Use Topics  . Smoking status: Current Every Day Smoker -- 1.00 packs/day for 20 years  . Smokeless tobacco: None  . Alcohol Use: 1.2 oz/week    2 Standard drinks or equivalent per week   OB History    No data available     Review of Systems.10 Systems reviewed and are negative for acute change except as noted in the HPI.  Allergies  Review of patient's allergies indicates no known allergies.  Home Medications   Prior to Admission  medications   Medication Sig Start Date End Date Taking? Authorizing Provider  amoxicillin-clavulanate (AUGMENTIN) 875-125 MG tablet Take 1 tablet by mouth 2 (two) times daily. 02/26/15 03/08/15  Jan Fireman, PA-C  clonazePAM (KLONOPIN) 0.5 MG tablet Take 0.5 tablets (0.25 mg total) by mouth 3 (three) times daily as needed for anxiety. 01/09/15   Glean Hess, MD  losartan-hydrochlorothiazide (HYZAAR) 100-25 MG per tablet Take 1 tablet by mouth daily. 12/20/14   Glean Hess, MD  varenicline (CHANTIX STARTING MONTH PAK) 0.5 MG X 11 & 1 MG X 42 tablet Take one 0.5 mg tablet by mouth once daily for 3 days, then increase to one 0.5 mg tablet twice daily for 4 days, then increase to one 1 mg tablet twice daily. Patient not taking: Reported on 01/09/2015 12/20/14   Glean Hess, MD   Meds Ordered and Administered this Visit  Medications - No data to display  BP 150/92 mmHg  Pulse 95  Temp(Src) 98.6 F (37 C) (Oral)  Resp 20  Ht 5' 4.5" (1.638 m)  Wt 145 lb (65.772 kg)  BMI 24.51 kg/m2  SpO2 99% No data found.   Physical Exam  General: NAD, does not appear toxic, dental and facial discomfort right, HEENT:normocephalic,atraumatic, mucous membranes moist, no erythema Eyes: EOMI, conjunctiva clear, conjugate gaze, periorbital/sinus pressure Ears-  B canals neg- right TM retracted , valsalva releases/squeaks Neck: supple,mild right ant lymphadenopathy, tender  Resp : CT A, bilat; normal respiratory effort  Card : RRR Abd:  Not distended Skin: no rash, skin intact MSK: no deformities, ambulatory without assistance Neuro : good attention,recall-good memory, no focal neuro deficits Psych: speech and behavior appropriate    ED Course  Procedures (including critical care time)  Labs Review Labs Reviewed - No data to display  Imaging Review No results found.  Discussed consideration of maintaining Claritin   MDM   1. Acute maxillary sinusitis, recurrence not specified        Plan:   Diagnosis reviewed with patient--  Background of  Seasonal Allergies suspected     ATB RX for acute care as noted  Rx Fluticasone, loratadine ; - twice daily for 3 days then reduce to daily   Add OTC Guaifenesin DM to thin mucous;  Recommend supportive treatment with OTC tylenol/ibuprofen/fluids, warm packs  Risks ,benefits,potential side effects reviewed  Seek additional medical care if symptoms worsen or don't improve  Patient expresses understanding  and agrees to plan. Will return to [facility] with questions, concerns or exacerbation.    Marland Kitchen Discharge Medication List as of 02/26/2015  4:30 PM    START taking these medications   Details  amoxicillin-clavulanate (AUGMENTIN) 875-125 MG tablet Take 1 tablet by mouth 2 (two) times daily., Starting 02/26/2015, Until Wed 03/08/15, Normal        Jan Fireman, PA-C 02/27/15 [phone]

## 2015-02-26 NOTE — Discharge Instructions (Signed)
Claritin ( Loratadine) 10 mg 1` a daym ( increase to twice daily x 2-3 days OK ) Flonase ( fluticasone )  1 spray per nostril per day ( ditto) Ibuprofen 200 mg  ( 2-3 tablets with meals 3 x daily for 3 days) Steamy showers-increased fluids- void about every 2 hours Augmentin 875 mg as directed   Sinusitis, Adult Sinusitis is redness, soreness, and inflammation of the paranasal sinuses. Paranasal sinuses are air pockets within the bones of your face. They are located beneath your eyes, in the middle of your forehead, and above your eyes. In healthy paranasal sinuses, mucus is able to drain out, and air is able to circulate through them by way of your nose. However, when your paranasal sinuses are inflamed, mucus and air can become trapped. This can allow bacteria and other germs to grow and cause infection. Sinusitis can develop quickly and last only a short time (acute) or continue over a long period (chronic). Sinusitis that lasts for more than 12 weeks is considered chronic. CAUSES Causes of sinusitis include:  Allergies.  Structural abnormalities, such as displacement of the cartilage that separates your nostrils (deviated septum), which can decrease the air flow through your nose and sinuses and affect sinus drainage.  Functional abnormalities, such as when the small hairs (cilia) that line your sinuses and help remove mucus do not work properly or are not present. SIGNS AND SYMPTOMS Symptoms of acute and chronic sinusitis are the same. The primary symptoms are pain and pressure around the affected sinuses. Other symptoms include:  Upper toothache.  Earache.  Headache.  Bad breath.  Decreased sense of smell and taste.  A cough, which worsens when you are lying flat.  Fatigue.  Fever.  Thick drainage from your nose, which often is green and may contain pus (purulent).  Swelling and warmth over the affected sinuses. DIAGNOSIS Your health care provider will perform a  physical exam. During your exam, your health care provider may perform any of the following to help determine if you have acute sinusitis or chronic sinusitis:  Look in your nose for signs of abnormal growths in your nostrils (nasal polyps).  Tap over the affected sinus to check for signs of infection.  View the inside of your sinuses using an imaging device that has a light attached (endoscope). If your health care provider suspects that you have chronic sinusitis, one or more of the following tests may be recommended:  Allergy tests.  Nasal culture. A sample of mucus is taken from your nose, sent to a lab, and screened for bacteria.  Nasal cytology. A sample of mucus is taken from your nose and examined by your health care provider to determine if your sinusitis is related to an allergy. TREATMENT Most cases of acute sinusitis are related to a viral infection and will resolve on their own within 10 days. Sometimes, medicines are prescribed to help relieve symptoms of both acute and chronic sinusitis. These may include pain medicines, decongestants, nasal steroid sprays, or saline sprays. However, for sinusitis related to a bacterial infection, your health care provider will prescribe antibiotic medicines. These are medicines that will help kill the bacteria causing the infection. Rarely, sinusitis is caused by a fungal infection. In these cases, your health care provider will prescribe antifungal medicine. For some cases of chronic sinusitis, surgery is needed. Generally, these are cases in which sinusitis recurs more than 3 times per year, despite other treatments. HOME CARE INSTRUCTIONS  Drink plenty of water.  Water helps thin the mucus so your sinuses can drain more easily.  Use a humidifier.  Inhale steam 3-4 times a day (for example, sit in the bathroom with the shower running).  Apply a warm, moist washcloth to your face 3-4 times a day, or as directed by your health care  provider.  Use saline nasal sprays to help moisten and clean your sinuses.  Take medicines only as directed by your health care provider.  If you were prescribed either an antibiotic or antifungal medicine, finish it all even if you start to feel better. SEEK IMMEDIATE MEDICAL CARE IF:  You have increasing pain or severe headaches.  You have nausea, vomiting, or drowsiness.  You have swelling around your face.  You have vision problems.  You have a stiff neck.  You have difficulty breathing.   This information is not intended to replace advice given to you by your health care provider. Make sure you discuss any questions you have with your health care provider.   Document Released: 03/25/2005 Document Revised: 04/15/2014 Document Reviewed: 04/09/2011 Elsevier Interactive Patient Education [employer].

## 2015-02-27 ENCOUNTER — Encounter: Payer: Self-pay | Admitting: Physician Assistant

## 2015-03-28 ENCOUNTER — Ambulatory Visit (INDEPENDENT_AMBULATORY_CARE_PROVIDER_SITE_OTHER): Payer: BC Managed Care – PPO | Admitting: Internal Medicine

## 2015-03-28 ENCOUNTER — Other Ambulatory Visit: Payer: Self-pay | Admitting: Internal Medicine

## 2015-03-28 ENCOUNTER — Encounter: Payer: Self-pay | Admitting: Internal Medicine

## 2015-03-28 VITALS — BP 132/84 | HR 64 | Ht 64.5 in | Wt 152.4 lb

## 2015-03-28 DIAGNOSIS — Z124 Encounter for screening for malignant neoplasm of cervix: Secondary | ICD-10-CM | POA: Diagnosis not present

## 2015-03-28 DIAGNOSIS — I10 Essential (primary) hypertension: Secondary | ICD-10-CM | POA: Diagnosis not present

## 2015-03-28 NOTE — Progress Notes (Signed)
Date:  03/28/2015   Name:  Cheryl Mooney   DOB:  1966/03/29   MRN:  [id number]   Chief Complaint: Hypertension Patient also needs to have her annual Pap smear and pelvic exam.  It was not done at her [facility] in September because my wrist was fractured. Hypertension This is a chronic problem. The current episode started more than 1 year ago. The problem has been gradually improving since onset. The problem is controlled. Pertinent negatives include no chest pain, palpitations or shortness of breath. Past treatments include angiotensin blockers and diuretics. The current treatment provides significant improvement. There are no compliance problems.     Review of Systems  Respiratory: Negative for chest tightness, shortness of breath and wheezing.   Cardiovascular: Negative for chest pain and palpitations.  Genitourinary: Negative for dysuria, vaginal bleeding, vaginal discharge and vaginal pain.    Patient Active Problem List   Diagnosis Date Noted  . Tubular adenoma of colon 02/21/2015  . Benign neoplasm of transverse colon   . Benign neoplasm of sigmoid colon   . Compulsive tobacco user syndrome 11/27/2014  . BP (high blood pressure) 11/27/2014  . Family history of cancer of digestive system 11/27/2014  . Family history of breast cancer 11/27/2014  . 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 Authorizing Provider  clonazePAM (KLONOPIN) 0.5 MG tablet Take 0.5 tablets (0.25 mg total) by mouth 3 (three) times daily as needed for anxiety. 01/09/15  Yes Glean Hess, MD  losartan-hydrochlorothiazide (HYZAAR) 100-25 MG per tablet Take 1 tablet by mouth daily. 12/20/14  Yes Glean Hess, MD    No Known Allergies  Past Surgical History  Procedure Laterality Date  . Wisdom tooth extraction    . Colonoscopy with propofol N/A 02/17/2015    Procedure: COLONOSCOPY WITH  PROPOFOL;  Surgeon: Lucilla Lame, MD;  Location: [place];  Service: Endoscopy;  Laterality: N/A;  . Polypectomy  02/17/2015    Procedure: POLYPECTOMY;  Surgeon: Lucilla Lame, MD;  Location: [place];  Service: Endoscopy;;    Social History  Substance Use Topics  . Smoking status: Current Every Day Smoker -- 1.00 packs/day for 20 years  . Smokeless tobacco: None  . Alcohol Use: 1.2 oz/week    2 Standard drinks or equivalent per week    Medication list has been reviewed and updated.   Physical Exam  Constitutional: She appears well-developed and well-nourished.  Cardiovascular: Normal rate, regular rhythm and normal heart sounds.   Pulmonary/Chest: Effort normal and breath sounds normal.  Abdominal: Soft. Bowel sounds are normal. She exhibits no mass. There is no tenderness. There is no guarding.  Genitourinary: Rectum normal, vagina normal and uterus normal. There is no tenderness, lesion or injury on the right labia. There is no tenderness, lesion or injury on the left labia. Cervix exhibits no motion tenderness and no discharge. Right adnexum displays no mass, no tenderness and no fullness. Left adnexum displays no mass, no tenderness and no fullness.  Nursing note and vitals reviewed.   BP 120/80 mmHg  Pulse 64  Ht 5' 4.5" (1.638 m)  Wt 152 lb 6.4 oz (69.128 kg)  BMI 25.76 kg/m2  Assessment and Plan: 1. Essential hypertension Controlled - continue Hyzaar  2. Encounter for screening for cervical cancer  Normal exam - - Pap IG (Image Guided)  Halina Maidens, MD [facility]  03/28/2015

## 2015-03-31 LAB — PAP IG (IMAGE GUIDED): PAP Smear Comment: 0

## 2015-11-06 ENCOUNTER — Ambulatory Visit (INDEPENDENT_AMBULATORY_CARE_PROVIDER_SITE_OTHER): Payer: BC Managed Care – PPO | Admitting: Family Medicine

## 2015-11-06 ENCOUNTER — Encounter: Payer: Self-pay | Admitting: Family Medicine

## 2015-11-06 VITALS — BP 130/100 | HR 88 | Ht 65.0 in | Wt 146.0 lb

## 2015-11-06 DIAGNOSIS — B0230 Zoster ocular disease, unspecified: Secondary | ICD-10-CM | POA: Diagnosis not present

## 2015-11-06 DIAGNOSIS — M5127 Other intervertebral disc displacement, lumbosacral region: Secondary | ICD-10-CM

## 2015-11-06 MED ORDER — CYCLOBENZAPRINE HCL 10 MG PO TABS
10.0000 mg | ORAL_TABLET | Freq: Three times a day (TID) | ORAL | 1 refills | Status: DC | PRN
Start: 2015-11-06 — End: 2016-01-06

## 2015-11-06 MED ORDER — MELOXICAM 7.5 MG PO TABS: 7.5000 mg | ORAL_TABLET | Freq: Every day | ORAL | 0 refills | Status: DC

## 2015-11-06 NOTE — Patient Instructions (Signed)
Herniated Disk  A herniated disk occurs when a disk in your spine bulges out too far. This condition is also called a ruptured disk or slipped disk. Your spine (backbone) is made up of bones called vertebrae. Between each pair of vertebrae is an oval disk with a soft, spongy center that acts as a shock absorber when you move. The spongy center is surrounded by a tough outer ring.  When you have a herniated disk, the spongy center of the disk bulges out or ruptures through the outer ring. A herniated disk can press on a nerve between your vertebrae and cause pain. A herniated disk can occur anywhere in your back or neck area, but the lower back is the most common spot.  CAUSES   In many cases, a herniated disk occurs just from getting older. As you age, the spongy insides of your disks tend to shrink and dry out. A herniated disk can result from gradual wear and tear. Injury or sudden strain can also cause a herniated disk.   RISK FACTORS  Aging is the main risk factor for a herniated disk. Other risk factors include:   Being a man between the ages of 30 and 50 years.   Having a job that requires heavy lifting, bending, or twisting.   Having a job that requires long hours of driving.   Not getting enough exercise.   Being overweight.   Smoking.  SIGNS AND SYMPTOMS   Signs and symptoms depend on which disk is herniated.   For a herniated disk in the lower back, you may have sharp pain in:    One part of your leg, hip, or buttocks.    The back of your calf.    The top or sole of your foot (sciatica).    For a herniated disk in the neck, you may feel pain:    When you move your neck.    Near or over your shoulder blade.    That moves to your upper arm, forearm, or fingers.    You may also have muscle weakness. It may be hard to:    Lift your leg or arm.    Stand on your toes.    Squeeze tightly with one of your hands.   Other symptoms can include:    Numbness or tingling in the affected areas of your body.     Loss of bladder or bowel control. This is a rare but serious sign of a severe herniated disk in the lower back.  DIAGNOSIS   Your health care provider will do a physical exam. During this exam, you may have to move certain body parts or assume various positions. For example, your health care provider may do the straight-leg test. This is a good way to test for a herniated disk in your lower back. In this test, the health care provider lifts your leg while you lie on your back. This is to see if you feel pain down your leg. Your health care provider will also check for numbness or loss of feeling.   Your health care provider will also check your:   Reflexes.   Muscle strength.   Posture.   Other tests may be done to help in making a diagnosis. These may include:   An X-ray of the spine to rule out other causes of back pain.    Other imaging studies, such as an MRI or CT scan. This is to check whether the herniated disk is   pressing on your spinal canal.   Electromyography (EMG). This test checks the nerves that control muscles. It is sometimes used to identify the specific area of nerve involvement.   TREATMENT   In many cases, herniated disk symptoms go away over a period of days or weeks. You will most likely be free of symptoms in 3-4 months. Treatment may include the following:   The initial treatment for a herniated disk is ashort period of rest.    Bed rest is often limited to 1 or 2 days. Resting for too long delays recovery.    If you have a herniated disk in your lower back, you should avoid sitting as much as possible because sitting increases pressure on the disk.   Medicines. These may include:     Nonsteroidal anti-inflammatory drugs (NSAIDs).    Muscle relaxants for back spasms.    Narcotic pain medicine if your pain is very bad.    Steroid injections. You may need these along the involved nerve root to help control pain. The steroid is injected in the area of the herniated disk. It  helps by reducing swelling around the disk.   Physical therapy. This may include exercises to strengthen the muscles that help support your spine.    You may need surgery if other treatments do not work.   HOME CARE INSTRUCTIONS  Follow all your health care provider's instructions. These may include:   Take all medicines as directed by your health care provider.   Rest for 2 days and then start moving.   Do not sit or stand for long periods of time.   Maintain good posture when sitting and standing.   Avoid movements that cause pain, such as bending or lifting.   When you are able to start lifting things again:   Bend with your knees.   Keep your back straight.   Hold heavy objects close to your body.   If you are overweight, ask your health care provider to help you start a weight-loss program.   When you are able to start exercising, ask your health care provider how much and what type of exercise is best for you.   Work with a physical therapist on stretching and strengthening exercises for your back.   Do not wear high-heeled shoes.   Do not sleep on your belly.   Do not smoke.   Keep all follow-up visits as directed by your health care provider.  SEEK MEDICAL CARE IF:   You have back or neck pain that is not getting better after 4 weeks.   You have very bad pain in your back or neck.   You develop numbness, tingling, or weakness along with pain.  SEEK IMMEDIATE MEDICAL CARE IF:    You have numbness, tingling, or weakness that makes you unable to use your arms or legs.   You lose control of your bladder or bowels.   You have dizziness or fainting.   You have shortness of breath.   MAKE SURE YOU:    Understand these instructions.   Will watch your condition.   Will get help right away if you are not doing well or get worse.     This information is not intended to replace advice given to you by your health care provider. Make sure you discuss any questions you have with your health  care provider.     Document Released: 03/22/2000 Document Revised: 04/15/2014 Document Reviewed: 02/26/2013  Elsevier Interactive Patient Education   2016 [employer]

## 2015-11-06 NOTE — Progress Notes (Signed)
Name: Cheryl Mooney   MRN: [id number]    DOB: 09-13-1965   Date:11/06/2015       Progress Note  Subjective  Chief Complaint  Chief Complaint  Patient presents with  . Back Pain    hurting in lower back after doing some "heavy cleaning"- hurting down L) leg into bend of knee- has noticed a vein where the "pinching sensation is"    Back Pain  This is a new problem. The current episode started 1 to 4 weeks ago. The problem occurs daily. The problem has been waxing and waning since onset. The pain is present in the lumbar spine. The quality of the pain is described as aching. The pain radiates to the left thigh and left knee. The pain is moderate. The symptoms are aggravated by bending, twisting, sitting and standing. Pertinent negatives include no abdominal pain, chest pain, dysuria, fever, headaches, numbness, tingling or weight loss. She has tried NSAIDs for the symptoms. The treatment provided moderate relief.  Knee Pain   The incident occurred 2 days ago. There was no injury mechanism. The pain is present in the left knee. The quality of the pain is described as aching. The pain is moderate. The pain has been intermittent since onset. Pertinent negatives include no numbness or tingling. The symptoms are aggravated by movement. She has tried NSAIDs for the symptoms. The treatment provided mild relief.    No problem-specific Assessment & Plan notes found for this encounter.   Past Medical History:  Diagnosis Date  . Hypertension     Past Surgical History:  Procedure Laterality Date  . COLONOSCOPY WITH PROPOFOL N/A 02/17/2015   Procedure: COLONOSCOPY WITH PROPOFOL;  Surgeon: Lucilla Lame, MD;  Location: [place];  Service: Endoscopy;  Laterality: N/A;  . POLYPECTOMY  02/17/2015   Procedure: POLYPECTOMY;  Surgeon: Lucilla Lame, MD;  Location: [place];  Service: Endoscopy;;  . WISDOM TOOTH EXTRACTION      Family History  Problem Relation Age of Onset  . Colon  cancer Mother 52  . Breast cancer Mother   . Hypertension Father   . Stroke Father     Social History   Social History  . Marital status: Married    Spouse name: N/A  . Number of children: N/A  . Years of education: N/A   Occupational History  . Not on file.   Social History Main Topics  . Smoking status: Current Every Day Smoker    Packs/day: 1.00    Years: 20.00  . Smokeless tobacco: Never Used  . Alcohol use 1.2 oz/week    2 Standard drinks or equivalent per week  . Drug use: No  . Sexual activity: Yes   Other Topics Concern  . Not on file   Social History Narrative  . No narrative on file    No Known Allergies   Review of Systems  Constitutional: Negative for chills, fever, malaise/fatigue and weight loss.  HENT: Negative for ear discharge, ear pain and sore throat.   Eyes: Negative for blurred vision.  Respiratory: Negative for cough, sputum production, shortness of breath and wheezing.   Cardiovascular: Negative for chest pain, palpitations and leg swelling.  Gastrointestinal: Negative for abdominal pain, blood in stool, constipation, diarrhea, heartburn, melena and nausea.  Genitourinary: Negative for dysuria, frequency, hematuria and urgency.  Musculoskeletal: Positive for back pain. Negative for joint pain, myalgias and neck pain.  Skin: Negative for rash.  Neurological: Negative for dizziness, tingling, sensory change, focal weakness,  numbness and headaches.  Endo/Heme/Allergies: Negative for environmental allergies and polydipsia. Does not bruise/bleed easily.  Psychiatric/Behavioral: Negative for depression and suicidal ideas. The patient is not nervous/anxious and does not have insomnia.      Objective  Vitals:   11/06/15 1529  BP: (!) 130/100  Pulse: 88  Weight: 146 lb (66.2 kg)  Height: 5\' 5"  (1.651 m)    Physical Exam  Constitutional: She is well-developed, well-nourished, and in no distress. No distress.  HENT:  Head: Normocephalic  and atraumatic.  Right Ear: External ear normal.  Left Ear: External ear normal.  Nose: Nose normal.  Mouth/Throat: Oropharynx is clear and moist.  Eyes: Conjunctivae and EOM are normal. Pupils are equal, round, and reactive to light. Right eye exhibits no discharge. Left eye exhibits no discharge.  Neck: Normal range of motion. Neck supple. No JVD present. No thyromegaly present.  Cardiovascular: Normal rate, regular rhythm, normal heart sounds and intact distal pulses.  Exam reveals no gallop and no friction rub.   No murmur heard. Pulmonary/Chest: Effort normal and breath sounds normal.  Abdominal: Soft. Bowel sounds are normal. She exhibits no mass. There is no hepatosplenomegaly. There is no tenderness. There is no guarding and no CVA tenderness.  Musculoskeletal: Normal range of motion. She exhibits no edema.       Left knee: She exhibits normal meniscus. No tenderness found. No medial joint line, no lateral joint line, no MCL, no LCL and no patellar tendon tenderness noted.       Lumbar back: She exhibits spasm.  Lymphadenopathy:    She has no cervical adenopathy.  Neurological: She is alert. She has normal sensation, normal strength and normal reflexes. She has an abnormal Straight Leg Raise Test.  Skin: Skin is warm and dry. She is not diaphoretic.  Psychiatric: Mood and affect normal.  Nursing note and vitals reviewed.     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 Deanna Jones [facility]  11/06/15

## 2016-01-06 ENCOUNTER — Ambulatory Visit
Admission: EM | Admit: 2016-01-06 | Discharge: 2016-01-06 | Disposition: A | Discharge status: Home / Self Care | Payer: BC Managed Care – PPO | Attending: Family Medicine | Admitting: Family Medicine

## 2016-01-06 DIAGNOSIS — J0100 Acute maxillary sinusitis, unspecified: Secondary | ICD-10-CM

## 2016-01-06 MED ORDER — FLUTICASONE PROPIONATE 50 MCG/ACT NA SUSP: 2.0000 | Freq: Every day | NASAL | 0 refills | Status: DC

## 2016-01-06 MED ORDER — CEFUROXIME AXETIL 250 MG PO TABS: ORAL_TABLET | ORAL | 0 refills | Status: DC

## 2016-01-06 MED ORDER — HYDROCOD POLST-CPM POLST ER 10-8 MG/5ML PO SUER
5.0000 mL | Freq: Two times a day (BID) | ORAL | 0 refills | Status: DC
Start: 2016-01-06 — End: 2016-02-09

## 2016-01-06 NOTE — ED Triage Notes (Signed)
Patient complains of sinus pain and pressure, cough with productivity. Patient states that she has tried Advil cold and sinus without relief. Patient states that she has also been using her Neti pot. Patient reports that this started 2 weeks ago.

## 2016-01-06 NOTE — ED Provider Notes (Signed)
CSN: [id number]     Arrival date & time 01/06/16  1517 History   First MD Initiated Contact with Patient 01/06/16 1637     Chief Complaint  Patient presents with  . Sinus Problem   (Consider location/radiation/quality/duration/timing/severity/associated sxs/prior Treatment) HPI  This a 50 year old female who presents with a two-week history of left-sided sinus pain and pressure cough that is productive of yellowish sputum chills but no current fever. He states that she has tried numerous over-the-counter remedies which have not been beneficial. She is even gone to using a Netty pot. She states that she is not feeling well. She is currently trying to stop smoking and has been prescribed Chantix by her primary care physician      Past Medical History:  Diagnosis Date  . Hypertension    Past Surgical History:  Procedure Laterality Date  . COLONOSCOPY WITH PROPOFOL N/A 02/17/2015   Procedure: COLONOSCOPY WITH PROPOFOL;  Surgeon: Lucilla Lame, MD;  Location: [place];  Service: Endoscopy;  Laterality: N/A;  . POLYPECTOMY  02/17/2015   Procedure: POLYPECTOMY;  Surgeon: Lucilla Lame, MD;  Location: [place];  Service: Endoscopy;;  . WISDOM TOOTH EXTRACTION     Family History  Problem Relation Age of Onset  . Colon cancer Mother 43  . Breast cancer Mother   . Hypertension Father   . Stroke Father    Social History  Substance Use Topics  . Smoking status: Current Every Day Smoker    Packs/day: 0.25    Years: 20.00  . Smokeless tobacco: Never Used  . Alcohol use 1.2 oz/week    2 Standard drinks or equivalent per week     Comment: occasional   OB History    No data available     Review of Systems  Constitutional: Positive for activity change, chills, fatigue and fever.  HENT: Positive for congestion, postnasal drip, rhinorrhea and sinus pressure.   Respiratory: Positive for cough. Negative for shortness of breath, wheezing and stridor.   All other systems  reviewed and are negative.   Allergies  Review of patient's allergies indicates no known allergies.  Home Medications   Prior to Admission medications   Medication Sig Start Date End Date Taking? Authorizing Provider  CHANTIX STARTING MONTH PAK 0.5 MG X 11 & 1 MG X 42 tablet  12/20/14  Yes Historical Provider, MD  cefUROXime (CEFTIN) 250 MG tablet Take one tab BID with food 01/06/16   Lorin Picket, PA-C  chlorpheniramine-HYDROcodone (TUSSIONEX PENNKINETIC ER) 10-8 MG/5ML SUER Take 5 mLs by mouth 2 (two) times daily. 01/06/16   Lorin Picket, PA-C  fluticasone (FLONASE) 50 MCG/ACT nasal spray Place 2 sprays into both nostrils daily. 01/06/16   Lorin Picket, PA-C  losartan-hydrochlorothiazide (HYZAAR) 100-25 MG per tablet Take 1 tablet by mouth daily. 12/20/14   Glean Hess, MD   Meds Ordered and Administered this Visit  Medications - No data to display  BP (!) 133/91 (BP Location: Left Arm)   Pulse 86   Temp 98.1 F (36.7 C) (Oral)   Resp 16   Ht 5\' 5"  (1.651 m)   Wt 148 lb (67.1 kg)   SpO2 100%   BMI 24.63 kg/m  No data found.   Physical Exam  Constitutional: She is oriented to person, place, and time. She appears well-developed and well-nourished. No distress.  HENT:  Head: Normocephalic and atraumatic.  Eyes: EOM are normal. Pupils are equal, round, and reactive to light.  Neck: Normal  range of motion. Neck supple.  Pulmonary/Chest: Effort normal and breath sounds normal. No respiratory distress. She has no wheezes. She has no rales.  Musculoskeletal: Normal range of motion.  Neurological: She is alert and oriented to person, place, and time.  Skin: Skin is warm and dry. She is not diaphoretic.  Psychiatric: She has a normal mood and affect. Her behavior is normal. Judgment and thought content normal.  Nursing note and vitals reviewed.   Urgent Care Course   Clinical Course    Procedures (including critical care time)  Labs Review Labs Reviewed - No  data to display  Imaging Review No results found.   Visual Acuity Review  Right Eye Distance:   Left Eye Distance:   Bilateral Distance:    Right Eye Near:   Left Eye Near:    Bilateral Near:         MDM   1. Acute maxillary sinusitis, recurrence not specified    Discharge Medication List as of 01/06/2016  4:49 PM    START taking these medications   Details  cefUROXime (CEFTIN) 250 MG tablet Take one tab BID with food, Print    chlorpheniramine-HYDROcodone (TUSSIONEX PENNKINETIC ER) 10-8 MG/5ML SUER Take 5 mLs by mouth 2 (two) times daily., Starting Sat 01/06/2016, Print    fluticasone (FLONASE) 50 MCG/ACT nasal spray Place 2 sprays into both nostrils daily., Starting Sat 01/06/2016, Print      Plan: 1. Test/x-ray results and diagnosis reviewed with patient 2. rx as per orders; risks, benefits, potential side effects reviewed with patient 3. Recommend supportive treatment with Rest and fluids. Start on Flonase for drainage and she should continue using it for about a month. Because she's had the symptoms for over 2 weeks I will begin her on some antibiotics. If she is not improving or is worsening she should follow-up with her primary care physician. So recommended the use of a cold mist vaporizer at nighttime. 4. F/u prn if symptoms worsen or don't improve     Lorin Picket, PA-C 01/06/16 [place], PA-C 01/06/16 1725

## 2016-01-08 ENCOUNTER — Telehealth: Payer: Self-pay | Admitting: Emergency Medicine

## 2016-01-08 NOTE — Telephone Encounter (Signed)
Tried calling patient- no answer.  Left message to call us back if her symptoms are not improving or worsening.

## 2016-02-09 ENCOUNTER — Ambulatory Visit (INDEPENDENT_AMBULATORY_CARE_PROVIDER_SITE_OTHER): Payer: BC Managed Care – PPO | Admitting: Internal Medicine

## 2016-02-09 ENCOUNTER — Encounter: Payer: Self-pay | Admitting: Internal Medicine

## 2016-02-09 VITALS — BP 140/92 | HR 82 | Temp 98.2°F | Resp 16 | Ht 65.0 in | Wt 145.0 lb

## 2016-02-09 DIAGNOSIS — J40 Bronchitis, not specified as acute or chronic: Secondary | ICD-10-CM | POA: Diagnosis not present

## 2016-02-09 DIAGNOSIS — I10 Essential (primary) hypertension: Secondary | ICD-10-CM

## 2016-02-09 MED ORDER — LOSARTAN POTASSIUM-HCTZ 100-25 MG PO TABS: 1.0000 | ORAL_TABLET | Freq: Every day | ORAL | 3 refills | Status: DC

## 2016-02-09 MED ORDER — AMOXICILLIN-POT CLAVULANATE 875-125 MG PO TABS: 1.0000 | ORAL_TABLET | Freq: Two times a day (BID) | ORAL | 0 refills | Status: DC

## 2016-02-09 NOTE — Progress Notes (Signed)
Date:  02/09/2016   Name:  Cheryl Mooney   DOB:  Jul 12, 1965   MRN:  [id number]   Chief Complaint: URI (Went to [facility] 9/30 treated for sinus and cough with Abx and still having coughing, wheezing at night, Sore throat from productive thick phlem coming up. ) URI   This is a chronic problem. The current episode started more than 1 month ago. The problem has been gradually worsening. There has been no fever. Associated symptoms include congestion, coughing and wheezing. Pertinent negatives include no chest pain, nausea or vomiting. Treatments tried: ceftin and tussionex. Improvement on treatment: relieved sinus infection but cough continues.     Review of Systems  Constitutional: Positive for fatigue. Negative for chills, fever and unexpected weight change.  HENT: Positive for congestion. Negative for sinus pressure.   Eyes: Negative for visual disturbance.  Respiratory: Positive for cough and wheezing. Negative for chest tightness and shortness of breath.   Cardiovascular: Negative for chest pain.  Gastrointestinal: Negative for constipation, nausea and vomiting.    Patient Active Problem List   Diagnosis Date Noted  . Tubular adenoma of colon 02/21/2015  . Benign neoplasm of transverse colon   . Benign neoplasm of sigmoid colon   . Compulsive tobacco user syndrome 11/27/2014  . Essential hypertension 11/27/2014  . Family history of cancer of digestive system 11/27/2014  . Family history of breast cancer 11/27/2014  . 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 Authorizing Provider  losartan-hydrochlorothiazide (HYZAAR) 100-25 MG per tablet Take 1 tablet by mouth daily. 12/20/14  Yes Glean Hess, MD    No Known Allergies  Past Surgical History:  Procedure Laterality Date  . COLONOSCOPY WITH PROPOFOL N/A 02/17/2015   Procedure: COLONOSCOPY WITH PROPOFOL;  Surgeon:  Lucilla Lame, MD;  Location: [place];  Service: Endoscopy;  Laterality: N/A;  . POLYPECTOMY  02/17/2015   Procedure: POLYPECTOMY;  Surgeon: Lucilla Lame, MD;  Location: [place];  Service: Endoscopy;;  . WISDOM TOOTH EXTRACTION      Social History  Substance Use Topics  . Smoking status: Light Tobacco Smoker    Packs/day: 0.25    Years: 20.00  . Smokeless tobacco: Never Used  . Alcohol use 1.2 oz/week    2 Standard drinks or equivalent per week     Comment: occasional     Medication list has been reviewed and updated.   Physical Exam  Constitutional: She is oriented to person, place, and time. She appears well-developed. She appears ill. No distress.  HENT:  Head: Normocephalic and atraumatic.  Cardiovascular: Normal rate, regular rhythm and normal heart sounds.   Pulmonary/Chest: Effort normal. No respiratory distress. She has wheezes.  Musculoskeletal: Normal range of motion.  Neurological: She is alert and oriented to person, place, and time.  Skin: Skin is warm and dry. No rash noted.  Psychiatric: She has a normal mood and affect. Her behavior is normal. Thought content normal.  Nursing note and vitals reviewed.   BP (!) 140/92   Pulse 82   Temp 98.2 F (36.8 C) (Oral)   Resp 16   Ht 5\' 5"  (1.651 m)   Wt 145 lb (65.8 kg)   SpO2 98%   BMI 24.13 kg/m   Assessment and Plan: 1. Bronchitis Begin mucinex bid with sufficient fluids Delsym for cough - amoxicillin-clavulanate (AUGMENTIN) 875-125 MG tablet; Take 1 tablet  by mouth 2 (two) times daily.  Dispense: 20 tablet; Refill: 0  2. Essential hypertension - losartan-hydrochlorothiazide (HYZAAR) 100-25 MG tablet; Take 1 tablet by mouth daily.  Dispense: 90 tablet; Refill: [place], MD [facility]  02/09/2016

## 2016-02-09 NOTE — Patient Instructions (Signed)
Mucinex (plain, generic) take as directed with full glass of water   Delsym cough syrup as needed

## 2016-06-18 ENCOUNTER — Other Ambulatory Visit: Payer: Self-pay | Admitting: Internal Medicine

## 2016-06-18 ENCOUNTER — Telehealth: Payer: Self-pay

## 2016-06-18 DIAGNOSIS — Z1239 Encounter for other screening for malignant neoplasm of breast: Secondary | ICD-10-CM

## 2016-06-18 NOTE — Telephone Encounter (Signed)
Pt called stating she couldn't get physical until August and would like Mammo done before then. Wants referral for Mammo.

## 2016-06-18 NOTE — Telephone Encounter (Signed)
Orders placed.  She can call [facility] to schedule.

## 2016-11-14 ENCOUNTER — Telehealth: Payer: Self-pay

## 2016-11-14 NOTE — Telephone Encounter (Signed)
Recall on BP meds. Wants new BP med sent in.

## 2016-11-15 ENCOUNTER — Other Ambulatory Visit: Payer: Self-pay | Admitting: Internal Medicine

## 2016-11-15 NOTE — Telephone Encounter (Signed)
She is on losartan - it has not been recalled.

## 2016-11-18 ENCOUNTER — Ambulatory Visit (INDEPENDENT_AMBULATORY_CARE_PROVIDER_SITE_OTHER): Payer: BC Managed Care – PPO | Admitting: Internal Medicine

## 2016-11-18 ENCOUNTER — Encounter: Payer: Self-pay | Admitting: Internal Medicine

## 2016-11-18 VITALS — BP 126/84 | HR 81 | Ht 65.0 in | Wt 154.8 lb

## 2016-11-18 DIAGNOSIS — Z1159 Encounter for screening for other viral diseases: Secondary | ICD-10-CM | POA: Diagnosis not present

## 2016-11-18 DIAGNOSIS — F17200 Nicotine dependence, unspecified, uncomplicated: Secondary | ICD-10-CM | POA: Diagnosis not present

## 2016-11-18 DIAGNOSIS — Z0000 Encounter for general adult medical examination without abnormal findings: Secondary | ICD-10-CM | POA: Diagnosis not present

## 2016-11-18 DIAGNOSIS — I10 Essential (primary) hypertension: Secondary | ICD-10-CM | POA: Diagnosis not present

## 2016-11-18 DIAGNOSIS — Z1239 Encounter for other screening for malignant neoplasm of breast: Secondary | ICD-10-CM

## 2016-11-18 DIAGNOSIS — Z111 Encounter for screening for respiratory tuberculosis: Secondary | ICD-10-CM | POA: Diagnosis not present

## 2016-11-18 DIAGNOSIS — D485 Neoplasm of uncertain behavior of skin: Secondary | ICD-10-CM

## 2016-11-18 LAB — POCT URINALYSIS DIPSTICK
Bilirubin, UA: NEGATIVE
Blood, UA: NEGATIVE
Glucose, UA: NEGATIVE
Ketones, UA: NEGATIVE
Leukocytes, UA: NEGATIVE
Nitrite, UA: NEGATIVE
Protein, UA: NEGATIVE
Spec Grav, UA: 1.01 (ref 1.010–1.025)
Urobilinogen, UA: 0.2 E.U./dL
pH, UA: 6 (ref 5.0–8.0)

## 2016-11-18 MED ORDER — VARENICLINE TARTRATE 1 MG PO TABS: 1.0000 mg | ORAL_TABLET | Freq: Two times a day (BID) | ORAL | 3 refills | Status: DC

## 2016-11-18 NOTE — Progress Notes (Signed)
Date:  11/18/2016   Name:  Cheryl Mooney   DOB:  21-Apr-1965   MRN:  [id number]   Chief Complaint: Annual Exam (Breast exam. Needs Mammo.) and Hypertension Cheryl Mooney is a 51 y.o. female who presents today for her Complete Annual Exam. She feels fairly well. She reports exercising regularly. She reports she is sleeping well.   She is due for a mammogram. She has several lesions on her face that are new and she would like to see Dermatology. She dropped a suitcase on her 4th right toe 5 days ago - it is bruised and she can move it but is concerned about possible fracture.  Hypertension  This is a chronic problem. The problem is controlled. Pertinent negatives include no chest pain, headaches, palpitations or shortness of breath. Past treatments include ACE inhibitors and diuretics. The current treatment provides significant improvement.   Tobacco use - she used Chantix in the past without side effects but then stopped it and started back.  She wants to try again.  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  She has never reacted to PPD in the past.  No TB exposures known.   Review of Systems  Constitutional: Negative for chills, fatigue and fever.  HENT: Negative for congestion, hearing loss, tinnitus, trouble swallowing and voice change.   Eyes: Negative for visual disturbance.  Respiratory: Negative for cough, chest tightness, shortness of breath and wheezing.   Cardiovascular: Negative for chest pain, palpitations and leg swelling.  Gastrointestinal: Negative for abdominal pain, constipation, diarrhea and vomiting.  Endocrine: Negative for polydipsia and polyuria.  Genitourinary: Negative for dysuria, frequency, genital sores, vaginal bleeding and vaginal discharge.  Musculoskeletal: Positive for arthralgias (toe pain). Negative for gait problem and joint swelling.  Skin: Positive for color change (several facial lesion).  Negative for rash.  Neurological: Negative for dizziness, tremors, light-headedness and headaches.  Hematological: Negative for adenopathy. Does not bruise/bleed easily.  Psychiatric/Behavioral: Negative for dysphoric mood and sleep disturbance. The patient is not nervous/anxious.     Patient Active Problem List   Diagnosis Date Noted  . Tubular adenoma of colon 02/21/2015  . Benign neoplasm of transverse colon   . Benign neoplasm of sigmoid colon   . Compulsive tobacco user syndrome 11/27/2014  . Essential hypertension 11/27/2014  . Family history of cancer of digestive system 11/27/2014  . Family history of breast cancer 11/27/2014  . 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 Authorizing Provider  losartan-hydrochlorothiazide (HYZAAR) 100-25 MG tablet Take 1 tablet by mouth daily. 02/09/16   Glean Hess, MD    No Known Allergies  Past Surgical History:  Procedure Laterality Date  . COLONOSCOPY WITH PROPOFOL N/A 02/17/2015   Procedure: COLONOSCOPY WITH PROPOFOL;  Surgeon: Lucilla Lame, MD;  Location: [place];  Service: Endoscopy;  Laterality: N/A;  . POLYPECTOMY  02/17/2015   Procedure: POLYPECTOMY;  Surgeon: Lucilla Lame, MD;  Location: [place];  Service: Endoscopy;;  . WISDOM TOOTH EXTRACTION      Social History  Substance Use Topics  . Smoking status: Light Tobacco Smoker    Packs/day: 0.25    Years: 20.00  . Smokeless tobacco: Never Used  . Alcohol use 1.2 oz/week    2 Standard drinks or equivalent per week     Comment: occasional   Depression  screen PHQ 2/9 11/18/2016  Decreased Interest 0  Down, Depressed, Hopeless 0  PHQ - 2 Score 0    Medication list has been reviewed and updated.   Physical Exam  Constitutional: She is oriented to person, place, and time. She appears well-developed and well-nourished. No distress.  HENT:    Head: Normocephalic and atraumatic.  Right Ear: Tympanic membrane and ear canal normal.  Left Ear: Tympanic membrane and ear canal normal.  Nose: Right sinus exhibits no maxillary sinus tenderness. Left sinus exhibits no maxillary sinus tenderness.  Mouth/Throat: Uvula is midline and oropharynx is clear and moist.  Eyes: Conjunctivae and EOM are normal. Right eye exhibits no discharge. Left eye exhibits no discharge. No scleral icterus.  Neck: Normal range of motion. Carotid bruit is not present. No erythema present. No thyromegaly present.  Cardiovascular: Normal rate, regular rhythm, normal heart sounds and normal pulses.   Pulmonary/Chest: Effort normal. No respiratory distress. She has no wheezes. Right breast exhibits no mass, no nipple discharge, no skin change and no tenderness. Left breast exhibits no mass, no nipple discharge, no skin change and no tenderness.  Abdominal: Soft. Bowel sounds are normal. There is no hepatosplenomegaly. There is no tenderness. There is no CVA tenderness.  Musculoskeletal: Normal range of motion. She exhibits tenderness (right 4th toe). She exhibits no edema.  Lymphadenopathy:    She has no cervical adenopathy.    She has no axillary adenopathy.  Neurological: She is alert and oriented to person, place, and time. She has normal reflexes. No cranial nerve deficit or sensory deficit.  Skin: Skin is warm, dry and intact. No rash noted.  Small papules on cheek and chin  Psychiatric: She has a normal mood and affect. Her speech is normal and behavior is normal. Thought content normal.  Nursing note and vitals reviewed.   BP 126/84   Pulse 81   Ht 5\' 5"  (1.651 m)   Wt 154 lb 12.8 oz (70.2 kg)   SpO2 98%   BMI 25.76 kg/m   Assessment and Plan: 1. Annual physical exam Normal exam - continue exercise and healthy diet - Lipid panel - POCT urinalysis dipstick  2. Breast cancer screening Schedule mammogram  3. Essential hypertension controlled -  CBC with Differential/Platelet - Comprehensive metabolic panel - TSH  4. Compulsive tobacco user syndrome Resume chantix and use for at least 3 months - varenicline (CHANTIX CONTINUING MONTH PAK) 1 MG tablet; Take 1 tablet (1 mg total) by mouth 2 (two) times daily.  Dispense: 60 tablet; Refill: 3  5. Need for hepatitis C screening test - Hepatitis C antibody  6. Neoplasm of uncertain behavior of skin Refer to Dermatology - Ambulatory referral to Dermatology  7. Screening-pulmonary TB Placed today - return in 48-72 hours Form will be completed - PPD   Meds ordered this encounter  Medications  . varenicline (CHANTIX CONTINUING MONTH PAK) 1 MG tablet    Sig: Take 1 tablet (1 mg total) by mouth 2 (two) times daily.    Dispense:  60 tablet    Refill:  [place], MD [facility]  11/18/2016

## 2016-11-18 NOTE — Patient Instructions (Signed)
Consider Shingles vaccine - Shingrix Breast Self-Awareness Breast self-awareness means being familiar with how your breasts look and feel. It involves checking your breasts regularly and reporting any changes to your health care provider. Practicing breast self-awareness is important. A change in your breasts can be a sign of a serious medical problem. Being familiar with how your breasts look and feel allows you to find any problems early, when treatment is more likely to be successful. All women should practice breast self-awareness, including women who have had breast implants. How to do a breast self-exam One way to learn what is normal for your breasts and whether your breasts are changing is to do a breast self-exam. To do a breast self-exam: Look for Changes  1. Remove all the clothing above your waist. 2. Stand in front of a mirror in a room with good lighting. 3. Put your hands on your hips. 4. Push your hands firmly downward. 5. Compare your breasts in the mirror. Look for differences between them (asymmetry), such as: ? Differences in shape. ? Differences in size. ? Puckers, dips, and bumps in one breast and not the other. 6. Look at each breast for changes in your skin, such as: ? Redness. ? Scaly areas. 7. Look for changes in your nipples, such as: ? Discharge. ? Bleeding. ? Dimpling. ? Redness. ? A change in position. Feel for Changes  Carefully feel your breasts for lumps and changes. It is best to do this while lying on your back on the floor and again while sitting or standing in the shower or tub with soapy water on your skin. Feel each breast in the following way:  Place the arm on the side of the breast you are examining above your head.  Feel your breast with the other hand.  Start in the nipple area and make  inch (2 cm) overlapping circles to feel your breast. Use the pads of your three middle fingers to do this. Apply light pressure, then medium pressure,  then firm pressure. The light pressure will allow you to feel the tissue closest to the skin. The medium pressure will allow you to feel the tissue that is a little deeper. The firm pressure will allow you to feel the tissue close to the ribs.  Continue the overlapping circles, moving downward over the breast until you feel your ribs below your breast.  Move one finger-width toward the center of the body. Continue to use the  inch (2 cm) overlapping circles to feel your breast as you move slowly up toward your collarbone.  Continue the up and down exam using all three pressures until you reach your armpit.  Write Down What You Find  Write down what is normal for each breast and any changes that you find. Keep a written record with breast changes or normal findings for each breast. By writing this information down, you do not need to depend only on memory for size, tenderness, or location. Write down where you are in your menstrual cycle, if you are still menstruating. If you are having trouble noticing differences in your breasts, do not get discouraged. With time you will become more familiar with the variations in your breasts and more comfortable with the exam. How often should I examine my breasts? Examine your breasts every month. If you are breastfeeding, the best time to examine your breasts is after a feeding or after using a breast pump. If you menstruate, the best time to examine your breasts  is 5-7 days after your period is over. During your period, your breasts are lumpier, and it may be more difficult to notice changes. When should I see my health care provider? See your health care provider if you notice:  A change in shape or size of your breasts or nipples.  A change in the skin of your breast or nipples, such as a reddened or scaly area.  Unusual discharge from your nipples.  A lump or thick area that was not there before.  Pain in your breasts.  Anything that concerns  you.  This information is not intended to replace advice given to you by your health care provider. Make sure you discuss any questions you have with your health care provider. Document Released: 03/25/2005 Document Revised: 08/31/2015 Document Reviewed: 02/12/2015 Elsevier Interactive Patient Education  [employer].

## 2016-11-19 ENCOUNTER — Encounter: Payer: Self-pay | Admitting: Internal Medicine

## 2016-11-19 DIAGNOSIS — E782 Mixed hyperlipidemia: Secondary | ICD-10-CM | POA: Insufficient documentation

## 2016-11-19 LAB — LIPID PANEL
Chol/HDL Ratio: 4.2 ratio (ref 0.0–4.4)
Cholesterol, Total: 241 mg/dL — ABNORMAL HIGH (ref 100–199)
HDL: 58 mg/dL (ref >39)
LDL Calculated: 158 mg/dL — ABNORMAL HIGH (ref 0–99)
Triglycerides: 127 mg/dL (ref 0–149)
VLDL Cholesterol Cal: 25 mg/dL (ref 5–40)

## 2016-11-19 LAB — COMPREHENSIVE METABOLIC PANEL
ALT: 12 IU/L (ref 0–32)
AST: 18 IU/L (ref 0–40)
Albumin/Globulin Ratio: 2 (ref 1.2–2.2)
Albumin: 5.1 g/dL (ref 3.5–5.5)
Alkaline Phosphatase: 112 IU/L (ref 39–117)
BUN/Creatinine Ratio: 13 (ref 9–23)
BUN: 9 mg/dL (ref 6–24)
Bilirubin Total: 0.3 mg/dL (ref 0.0–1.2)
CO2: 23 mmol/L (ref 20–29)
Calcium: 9.8 mg/dL (ref 8.7–10.2)
Chloride: 97 mmol/L (ref 96–106)
Creatinine, Ser: 0.72 mg/dL (ref 0.57–1.00)
GFR calc Af Amer: 111 mL/min/{1.73_m2} (ref >59)
GFR calc non Af Amer: 96 mL/min/{1.73_m2} (ref >59)
Globulin, Total: 2.6 g/dL (ref 1.5–4.5)
Glucose: 90 mg/dL (ref 65–99)
Potassium: 3.9 mmol/L (ref 3.5–5.2)
Sodium: 138 mmol/L (ref 134–144)
Total Protein: 7.7 g/dL (ref 6.0–8.5)

## 2016-11-19 LAB — HEPATITIS C ANTIBODY: Hep C Virus Ab: 0.1 s/co ratio (ref 0.0–0.9)

## 2016-11-19 LAB — CBC WITH DIFFERENTIAL/PLATELET
Basophils Absolute: 0 10*3/uL (ref 0.0–0.2)
Basos: 0 %
EOS (ABSOLUTE): 0.2 10*3/uL (ref 0.0–0.4)
Eos: 2 %
Hematocrit: 43.9 % (ref 34.0–46.6)
Hemoglobin: 14.9 g/dL (ref 11.1–15.9)
Immature Grans (Abs): 0 10*3/uL (ref 0.0–0.1)
Immature Granulocytes: 0 %
Lymphocytes Absolute: 3.9 10*3/uL — ABNORMAL HIGH (ref 0.7–3.1)
Lymphs: 41 %
MCH: 30.6 pg (ref 26.6–33.0)
MCHC: 33.9 g/dL (ref 31.5–35.7)
MCV: 90 fL (ref 79–97)
Monocytes Absolute: 0.4 10*3/uL (ref 0.1–0.9)
Monocytes: 5 %
Neutrophils Absolute: 4.9 10*3/uL (ref 1.4–7.0)
Neutrophils: 52 %
Platelets: 323 10*3/uL (ref 150–379)
RBC: 4.87 x10E6/uL (ref 3.77–5.28)
RDW: 13.4 % (ref 12.3–15.4)
WBC: 9.5 10*3/uL (ref 3.4–10.8)

## 2016-11-19 LAB — TSH: TSH: 1.2 u[IU]/mL (ref 0.450–4.500)

## 2016-11-20 ENCOUNTER — Ambulatory Visit
Admission: RE | Admit: 2016-11-20 | Discharge: 2016-11-20 | Disposition: A | Discharge status: Home / Self Care | Payer: BC Managed Care – PPO | Source: Ambulatory Visit | Attending: Internal Medicine | Admitting: Internal Medicine

## 2016-11-20 DIAGNOSIS — Z1231 Encounter for screening mammogram for malignant neoplasm of breast: Secondary | ICD-10-CM | POA: Insufficient documentation

## 2016-11-20 DIAGNOSIS — Z1239 Encounter for other screening for malignant neoplasm of breast: Secondary | ICD-10-CM

## 2016-11-20 LAB — TB SKIN TEST
Induration: 0 mm
TB Skin Test: NEGATIVE

## 2016-11-26 ENCOUNTER — Encounter: Payer: BC Managed Care – PPO | Admitting: Internal Medicine

## 2017-01-13 ENCOUNTER — Ambulatory Visit (INDEPENDENT_AMBULATORY_CARE_PROVIDER_SITE_OTHER): Payer: BC Managed Care – PPO | Admitting: Internal Medicine

## 2017-01-13 ENCOUNTER — Ambulatory Visit
Admission: RE | Admit: 2017-01-13 | Discharge: 2017-01-13 | Disposition: A | Discharge status: Home / Self Care | Payer: BC Managed Care – PPO | Source: Ambulatory Visit | Attending: Internal Medicine | Admitting: Internal Medicine

## 2017-01-13 ENCOUNTER — Encounter: Payer: Self-pay | Admitting: Internal Medicine

## 2017-01-13 VITALS — BP 138/80 | HR 74 | Ht 65.0 in | Wt 156.6 lb

## 2017-01-13 DIAGNOSIS — M25552 Pain in left hip: Secondary | ICD-10-CM | POA: Insufficient documentation

## 2017-01-13 DIAGNOSIS — I10 Essential (primary) hypertension: Secondary | ICD-10-CM | POA: Diagnosis not present

## 2017-01-13 MED ORDER — PREDNISONE 10 MG PO TABS: ORAL_TABLET | ORAL | 0 refills | Status: DC

## 2017-01-13 MED ORDER — LOSARTAN POTASSIUM-HCTZ 100-25 MG PO TABS: 1.0000 | ORAL_TABLET | Freq: Every day | ORAL | 3 refills | Status: DC

## 2017-01-13 NOTE — Progress Notes (Signed)
Date:  01/13/2017   Name:  Cheryl Mooney   DOB:  09/13/1965   MRN:  [id number]   Chief Complaint: Hip Pain (Left leg- Radiates from hip to knee. [place] in late march. Tripped over suitcase and landed on left side. Wedged between matress and footboard. Has nto been right since then. Can be sleeping and roll over on left side and pain wakes her up. Also, having muscle spasms in left thigh. Feels like gait is off balance by the end of the day. Patient was limping during walking. )  Hip Pain   Incident onset: at home in March 2018.  Her hip was bothering her off and on since March but nothing significant. However recently started swimming and taking place an organized exercise class. This is called significant worsening of her left hip discomfort. She describes pain in the lateral hip, she cannot lie on that side. Certain twisting movements also cause more discomfort. No numbness or weakness. At the time of her original fall she did not seek care or have x-rays performed.   Review of Systems  Constitutional: Negative for chills, fatigue and fever.  Respiratory: Negative for chest tightness and shortness of breath.   Musculoskeletal: Positive for arthralgias, gait problem and myalgias.  All other systems reviewed and are negative.   Patient Active Problem List   Diagnosis Date Noted  . Hyperlipidemia, mixed 11/19/2016  . Tubular adenoma of colon 02/21/2015  . Benign neoplasm of transverse colon   . Benign neoplasm of sigmoid colon   . Compulsive tobacco user syndrome 11/27/2014  . Essential hypertension 11/27/2014  . Family history of cancer of digestive system 11/27/2014  . Family history of breast cancer 11/27/2014  . 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 Authorizing Provider  losartan-hydrochlorothiazide (HYZAAR) 100-25 MG tablet Take 1 tablet by mouth daily. 02/09/16  Yes  Glean Hess, MD  varenicline (CHANTIX CONTINUING MONTH PAK) 1 MG tablet Take 1 tablet (1 mg total) by mouth 2 (two) times daily. 11/18/16  Yes Glean Hess, MD    No Known Allergies  Past Surgical History:  Procedure Laterality Date  . COLONOSCOPY WITH PROPOFOL N/A 02/17/2015   Procedure: COLONOSCOPY WITH PROPOFOL;  Surgeon: Lucilla Lame, MD;  Location: [place];  Service: Endoscopy;  Laterality: N/A;  . POLYPECTOMY  02/17/2015   Procedure: POLYPECTOMY;  Surgeon: Lucilla Lame, MD;  Location: [place];  Service: Endoscopy;;  . WISDOM TOOTH EXTRACTION      Social History  Substance Use Topics  . Smoking status: Light Tobacco Smoker    Packs/day: 0.25    Years: 20.00  . Smokeless tobacco: Never Used     Comment: CHANTIX  . Alcohol use 1.2 oz/week    2 Standard drinks or equivalent per week     Comment: occasional     Medication list has been reviewed and updated.  PHQ 2/9 Scores 11/18/2016  PHQ - 2 Score 0    Physical Exam  Constitutional: She is oriented to person, place, and time. She appears well-developed. No distress.  HENT:  Head: Normocephalic and atraumatic.  Cardiovascular: Normal rate, regular rhythm and normal heart sounds.   Pulmonary/Chest: Effort normal and breath sounds normal. No respiratory distress. She has no wheezes.  Musculoskeletal:       Left hip: She exhibits decreased range of motion (pain with internal rotation) and  tenderness (along lateral thigh).       Right knee: Normal.       Left knee: Normal.       Lumbar back: She exhibits normal range of motion and no tenderness.  Neurological: She is alert and oriented to person, place, and time. She has normal strength. No sensory deficit.  Reflex Scores:      Patellar reflexes are 2+ on the right side and 2+ on the left side.      Achilles reflexes are 1+ on the right side and 1+ on the left side. Skin: Skin is warm and dry. No rash noted.  Psychiatric: She has a normal  mood and affect. Her behavior is normal. Thought content normal.  Nursing note and vitals reviewed.   BP (!) 148/88 (BP Location: Right Arm, Patient Position: Sitting, Cuff Size: Normal)   Pulse 74   Ht 5\' 5"  (1.651 m)   Wt 156 lb 9.6 oz (71 kg)   SpO2 99%   BMI 26.06 kg/m   Assessment and Plan: 1. Pain of left hip joint Suspect OA with recent bursitis - predniSONE (DELTASONE) 10 MG tablet; Take 6 on day 1, 5 on day 2, 4 on day 3, 3 on day 4, 2 on day 5 and 1 on day 1 then stop.  Dispense: 21 tablet; Refill: 0 - DG HIP UNILAT WITH PELVIS 2-3 VIEWS LEFT; Future  2. Essential hypertension Fair control - continue current therapy - losartan-hydrochlorothiazide (HYZAAR) 100-25 MG tablet; Take 1 tablet by mouth daily.  Dispense: 90 tablet; Refill: 3   Meds ordered this encounter  Medications  . predniSONE (DELTASONE) 10 MG tablet    Sig: Take 6 on day 1, 5 on day 2, 4 on day 3, 3 on day 4, 2 on day 5 and 1 on day 1 then stop.    Dispense:  21 tablet    Refill:  0  . losartan-hydrochlorothiazide (HYZAAR) 100-25 MG tablet    Sig: Take 1 tablet by mouth daily.    Dispense:  90 tablet    Refill:  3    Partially dictated using Editor, commissioning. Any errors are unintentional.  Halina Maidens, MD [facility]  01/13/2017

## 2017-05-02 ENCOUNTER — Telehealth: Payer: Self-pay

## 2017-05-02 ENCOUNTER — Other Ambulatory Visit: Payer: Self-pay | Admitting: Internal Medicine

## 2017-05-02 DIAGNOSIS — I10 Essential (primary) hypertension: Secondary | ICD-10-CM

## 2017-05-02 MED ORDER — LISINOPRIL-HYDROCHLOROTHIAZIDE 20-25 MG PO TABS: 1.0000 | ORAL_TABLET | Freq: Every day | ORAL | 1 refills | Status: DC

## 2017-05-02 NOTE — Telephone Encounter (Signed)
Patient called stating she has heard of all the recalls going on with "sartan" meds. I informed her we have only been notified of losartan by itself. She would need to contact her pharmacy to find out if we need to change her medication but I informed her we have only been notified of losartan by itself and no combinations to this. She still insisted she is not comfortable with taking this medication and she wants a change that's not a "sartan"'' medicine. Wants sent to [facility] in [place].  Please Advise.

## 2017-05-02 NOTE — Telephone Encounter (Signed)
Needs to see me within 6 weeks to make sure the new medication is strong enough.

## 2017-05-02 NOTE — Telephone Encounter (Signed)
Patient informed needs 6 weeks appt. Sent to front desk for follow up.

## 2017-06-13 ENCOUNTER — Ambulatory Visit: Payer: BC Managed Care – PPO | Admitting: Internal Medicine

## 2017-06-13 ENCOUNTER — Encounter: Payer: Self-pay | Admitting: Internal Medicine

## 2017-06-13 VITALS — BP 128/80 | HR 69 | Ht 65.0 in | Wt 156.0 lb

## 2017-06-13 DIAGNOSIS — I10 Essential (primary) hypertension: Secondary | ICD-10-CM | POA: Diagnosis not present

## 2017-06-13 DIAGNOSIS — F17200 Nicotine dependence, unspecified, uncomplicated: Secondary | ICD-10-CM

## 2017-06-13 MED ORDER — LISINOPRIL-HYDROCHLOROTHIAZIDE 20-25 MG PO TABS: 1.0000 | ORAL_TABLET | Freq: Every day | ORAL | 3 refills | Status: DC

## 2017-06-13 NOTE — Progress Notes (Signed)
Date:  06/13/2017   Name:  Cheryl Mooney   DOB:  Aug 09, 1965   MRN:  [id number]   Chief Complaint: Hypertension (Was put on new meds with recall to losartan. BP still good.)  Hypertension  This is a chronic problem. The problem is unchanged. The problem is controlled. Pertinent negatives include no chest pain, headaches, palpitations or shortness of breath. Past treatments include ACE inhibitors and diuretics (changed from losartan to lisinopril).   Tobacco use - doing well with chantix.  Cutting way back and very pleased.   Review of Systems  Constitutional: Negative for chills, fatigue and fever.  Respiratory: Negative for chest tightness, shortness of breath and stridor.   Cardiovascular: Negative for chest pain, palpitations and leg swelling.  Gastrointestinal: Negative for abdominal pain.  Neurological: Negative for dizziness, light-headedness and headaches.  Psychiatric/Behavioral: Negative for sleep disturbance.    Patient Active Problem List   Diagnosis Date Noted  . Hyperlipidemia, mixed 11/19/2016  . Tubular adenoma of colon 02/21/2015  . Benign neoplasm of transverse colon   . Benign neoplasm of sigmoid colon   . Compulsive tobacco user syndrome 11/27/2014  . Essential hypertension 11/27/2014  . Family history of cancer of digestive system 11/27/2014  . Family history of breast cancer 11/27/2014  . 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 Authorizing Provider  lisinopril-hydrochlorothiazide (PRINZIDE,ZESTORETIC) 20-25 MG tablet Take 1 tablet by mouth daily. 05/02/17  Yes Glean Hess, MD  varenicline (CHANTIX CONTINUING MONTH PAK) 1 MG tablet Take 1 tablet (1 mg total) by mouth 2 (two) times daily. 11/18/16  Yes Glean Hess, MD    No Known Allergies  Past Surgical History:  Procedure Laterality Date  . COLONOSCOPY WITH PROPOFOL N/A 02/17/2015    Procedure: COLONOSCOPY WITH PROPOFOL;  Surgeon: Lucilla Lame, MD;  Location: [place];  Service: Endoscopy;  Laterality: N/A;  . POLYPECTOMY  02/17/2015   Procedure: POLYPECTOMY;  Surgeon: Lucilla Lame, MD;  Location: [place];  Service: Endoscopy;;  . WISDOM TOOTH EXTRACTION      Social History   Tobacco Use  . Smoking status: Light Tobacco Smoker    Packs/day: 0.25    Years: 20.00    Pack years: 5.00  . Smokeless tobacco: Never Used  . Tobacco comment: CHANTIX  Substance Use Topics  . Alcohol use: Yes    Alcohol/week: 1.2 oz    Types: 2 Standard drinks or equivalent per week    Comment: occasional  . Drug use: No     Medication list has been reviewed and updated.  PHQ 2/9 Scores 11/18/2016  PHQ - 2 Score 0    Physical Exam  Constitutional: She is oriented to person, place, and time. She appears well-developed. No distress.  HENT:  Head: Normocephalic and atraumatic.  Neck: Normal range of motion. Neck supple.  Cardiovascular: Normal rate, regular rhythm and normal heart sounds.  Pulmonary/Chest: Effort normal and breath sounds normal. No respiratory distress. She has no wheezes.  Musculoskeletal: Normal range of motion. She exhibits no edema.  Neurological: She is alert and oriented to person, place, and time.  Skin: Skin is warm and dry. No rash noted.  Psychiatric: She has a normal mood and affect. Her behavior is normal. Thought content normal.  Nursing note and vitals reviewed.   BP 128/80   Pulse 69   Ht 5\' 5"  (1.651 m)  Wt 156 lb (70.8 kg)   SpO2 99%   BMI 25.96 kg/m   Assessment and Plan: 1. Essential hypertension controlled - lisinopril-hydrochlorothiazide (PRINZIDE,ZESTORETIC) 20-25 MG tablet; Take 1 tablet by mouth daily.  Dispense: 90 tablet; Refill: 3  2. Tobacco use disorder Reducing smoking with chantix   Meds ordered this encounter  Medications  . lisinopril-hydrochlorothiazide (PRINZIDE,ZESTORETIC) 20-25 MG tablet     Sig: Take 1 tablet by mouth daily.    Dispense:  90 tablet    Refill:  3    Partially dictated using Editor, commissioning. Any errors are unintentional.  Halina Maidens, MD [facility]  06/13/2017

## 2017-10-20 ENCOUNTER — Ambulatory Visit: Payer: BC Managed Care – PPO | Admitting: Internal Medicine

## 2017-10-20 ENCOUNTER — Encounter: Payer: Self-pay | Admitting: Internal Medicine

## 2017-10-20 VITALS — BP 132/84 | HR 96 | Ht 65.0 in | Wt 159.0 lb

## 2017-10-20 DIAGNOSIS — S66911A Strain of unspecified muscle, fascia and tendon at wrist and hand level, right hand, initial encounter: Secondary | ICD-10-CM

## 2017-10-20 NOTE — Progress Notes (Signed)
Date:  10/20/2017   Name:  Cheryl Mooney   DOB:  03-08-1966   MRN:  [id number]   Chief Complaint: Hand Pain (Started a week ago. Patio door handle broke off so she is having to do more work opening and closing the door. Started both hands hurting but now its just ring finger on left hand and right hand. Right hand fingers and wrist is still very sore. Wears brace at night. Broke right wrist in the past. ) She is taking Advil 200 mg 4 times a day.  Her left hand is mostly normal but her right is still painful, mostly in the wrist, the fingers are tight but she has normal strength, sensation.   Review of Systems  Constitutional: Negative for chills and fatigue.  Respiratory: Negative for shortness of breath.   Cardiovascular: Negative for chest pain.  Musculoskeletal: Positive for arthralgias and joint swelling.    Patient Active Problem List   Diagnosis Date Noted  . Hyperlipidemia, mixed 11/19/2016  . Tubular adenoma of colon 02/21/2015  . Tobacco use disorder 11/27/2014  . Essential hypertension 11/27/2014  . Family history of breast cancer 11/27/2014  . 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 Authorizing Provider  lisinopril-hydrochlorothiazide (PRINZIDE,ZESTORETIC) 20-25 MG tablet Take 1 tablet by mouth daily. 06/13/17  Yes Glean Hess, MD    No Known Allergies  Past Surgical History:  Procedure Laterality Date  . COLONOSCOPY WITH PROPOFOL N/A 02/17/2015   Procedure: COLONOSCOPY WITH PROPOFOL;  Surgeon: Lucilla Lame, MD;  Location: [place];  Service: Endoscopy;  Laterality: N/A;  . POLYPECTOMY  02/17/2015   Procedure: POLYPECTOMY;  Surgeon: Lucilla Lame, MD;  Location: [place];  Service: Endoscopy;;  . WISDOM TOOTH EXTRACTION      Social History   Tobacco Use  . Smoking status: Heavy Tobacco Smoker    Packs/day: 0.50    Years:  20.00    Pack years: 10.00  . Smokeless tobacco: Never Used  Substance Use Topics  . Alcohol use: Yes    Alcohol/week: 1.2 oz    Types: 2 Standard drinks or equivalent per week    Comment: occasional  . Drug use: No     Medication list has been reviewed and updated.  Current Meds  Medication Sig  . lisinopril-hydrochlorothiazide (PRINZIDE,ZESTORETIC) 20-25 MG tablet Take 1 tablet by mouth daily.    PHQ 2/9 Scores 11/18/2016  PHQ - 2 Score 0    Physical Exam  Constitutional: She is oriented to person, place, and time. She appears well-developed. No distress.  HENT:  Head: Normocephalic and atraumatic.  Cardiovascular: Normal rate, regular rhythm and normal heart sounds.  Pulmonary/Chest: Effort normal and breath sounds normal. No respiratory distress.  Musculoskeletal: Normal range of motion.       Right wrist: She exhibits tenderness and swelling (slight dorsal swelling, no synovitis).  Neurological: She is alert and oriented to person, place, and time. She has normal strength. No cranial nerve deficit or sensory deficit.  Skin: Skin is warm and dry. No rash noted.  Psychiatric: She has a normal mood and affect. Her behavior is normal. Thought content normal.  Nursing note and vitals reviewed.   BP 132/84   Pulse 96   Ht 5\' 5"  (1.651 m)   Wt 159 lb (72.1 kg)   SpO2 97%   BMI 26.46 kg/m   Assessment  and Plan: 1. Wrist strain, right, initial encounter Continue conservative care, increase Advil to 400 mg tid if needed   No orders of the defined types were placed in this encounter.   Partially dictated using Editor, commissioning. Any errors are unintentional.  Halina Maidens, MD [facility]  10/20/2017   There are no diagnoses linked to this encounter.

## 2017-10-20 NOTE — Patient Instructions (Addendum)
Ibuprofen - 400 mg three times a day for the next week

## 2017-11-20 ENCOUNTER — Ambulatory Visit (INDEPENDENT_AMBULATORY_CARE_PROVIDER_SITE_OTHER): Payer: BC Managed Care – PPO | Admitting: Internal Medicine

## 2017-11-20 ENCOUNTER — Encounter: Payer: Self-pay | Admitting: Internal Medicine

## 2017-11-20 ENCOUNTER — Other Ambulatory Visit (HOSPITAL_COMMUNITY)
Admission: RE | Admit: 2017-11-20 | Discharge: 2017-11-20 | Disposition: A | Discharge status: Home / Self Care | Payer: BC Managed Care – PPO | Source: Ambulatory Visit | Attending: Internal Medicine | Admitting: Internal Medicine

## 2017-11-20 VITALS — BP 110/76 | HR 69 | Ht 65.0 in | Wt 159.0 lb

## 2017-11-20 DIAGNOSIS — E782 Mixed hyperlipidemia: Secondary | ICD-10-CM | POA: Diagnosis not present

## 2017-11-20 DIAGNOSIS — Z124 Encounter for screening for malignant neoplasm of cervix: Secondary | ICD-10-CM

## 2017-11-20 DIAGNOSIS — I10 Essential (primary) hypertension: Secondary | ICD-10-CM | POA: Diagnosis not present

## 2017-11-20 DIAGNOSIS — Z0000 Encounter for general adult medical examination without abnormal findings: Secondary | ICD-10-CM

## 2017-11-20 DIAGNOSIS — F17200 Nicotine dependence, unspecified, uncomplicated: Secondary | ICD-10-CM | POA: Diagnosis not present

## 2017-11-20 DIAGNOSIS — Z1231 Encounter for screening mammogram for malignant neoplasm of breast: Secondary | ICD-10-CM

## 2017-11-20 DIAGNOSIS — Z1239 Encounter for other screening for malignant neoplasm of breast: Secondary | ICD-10-CM

## 2017-11-20 LAB — POCT URINALYSIS DIPSTICK
Bilirubin, UA: NEGATIVE
Blood, UA: NEGATIVE
Glucose, UA: NEGATIVE
Ketones, UA: NEGATIVE
Leukocytes, UA: NEGATIVE
Nitrite, UA: NEGATIVE
Protein, UA: NEGATIVE
Spec Grav, UA: 1.01 (ref 1.010–1.025)
Urobilinogen, UA: 0.2 E.U./dL
pH, UA: 7 (ref 5.0–8.0)

## 2017-11-20 MED ORDER — BUPROPION HCL ER (SR) 150 MG PO TB12: 150.0000 mg | ORAL_TABLET | Freq: Two times a day (BID) | ORAL | 4 refills | Status: DC

## 2017-11-20 NOTE — Progress Notes (Signed)
Date:  11/20/2017   Name:  Cheryl Mooney   DOB:  [birth year]   MRN:  [id number]   Chief Complaint: Annual Exam (Breast and papsmear.) Cheryl Mooney is a 52 y.o. female who presents today for her Complete Annual Exam. She feels fairly well. She reports exercising some but plans to improve. She reports she is sleeping fairly well.   Hypertension  Pertinent negatives include no chest pain, headaches, palpitations or shortness of breath. Past treatments include ACE inhibitors and diuretics.  Nicotine Dependence  Presents for initial visit. Symptoms are negative for fatigue. Preferred tobacco types include cigarettes. Her urge triggers include company of smokers. She smokes < 1/2 a pack of cigarettes per day. Past treatments include varenicline. The treatment provided minimal relief. Cheryl Mooney is ready to quit.     Review of Systems  Constitutional: Negative for chills, fatigue and fever.  HENT: Negative for congestion, hearing loss, tinnitus, trouble swallowing and voice change.   Eyes: Negative for visual disturbance.  Respiratory: Negative for cough, chest tightness, shortness of breath and wheezing.   Cardiovascular: Negative for chest pain, palpitations and leg swelling.  Gastrointestinal: Negative for abdominal pain, constipation, diarrhea and vomiting.  Endocrine: Negative for polydipsia and polyuria.  Genitourinary: Negative for dysuria, frequency, genital sores, vaginal bleeding and vaginal discharge.  Musculoskeletal: Negative for arthralgias, gait problem and joint swelling.  Skin: Negative for color change and rash.  Neurological: Negative for dizziness, tremors, light-headedness and headaches.  Hematological: Negative for adenopathy. Does not bruise/bleed easily.  Psychiatric/Behavioral: Negative for dysphoric mood and sleep disturbance. The patient is not nervous/anxious.     Patient Active Problem List   Diagnosis Date Noted  . Hyperlipidemia, mixed 11/19/2016  .  Tubular adenoma of colon 02/21/2015  . Tobacco use disorder 11/27/2014  . Essential hypertension 11/27/2014  . Family history of breast cancer 11/27/2014  . Pap smear abnormality of cervix with ASCUS favoring benign 09/07/2011    No Known Allergies  Past Surgical History:  Procedure Laterality Date  . COLONOSCOPY WITH PROPOFOL N/A 02/17/2015   Procedure: COLONOSCOPY WITH PROPOFOL;  Surgeon: Lucilla Lame, MD;  Location: [place];  Service: Endoscopy;  Laterality: N/A;  . POLYPECTOMY  02/17/2015   Procedure: POLYPECTOMY;  Surgeon: Lucilla Lame, MD;  Location: [place];  Service: Endoscopy;;  . WISDOM TOOTH EXTRACTION      Social History   Tobacco Use  . Smoking status: Heavy Tobacco Smoker    Packs/day: 0.50    Years: 20.00    Pack years: 10.00  . Smokeless tobacco: Never Used  Substance Use Topics  . Alcohol use: Yes    Alcohol/week: 2.0 standard drinks    Types: 2 Standard drinks or equivalent per week    Comment: occasional  . Drug use: No     Medication list has been reviewed and updated.  Current Meds  Medication Sig  . lisinopril-hydrochlorothiazide (PRINZIDE,ZESTORETIC) 20-25 MG tablet Take 1 tablet by mouth daily.    PHQ 2/9 Scores 11/20/2017 11/18/2016  PHQ - 2 Score 0 0    Physical Exam  Constitutional: She is oriented to person, place, and time. She appears well-developed and well-nourished. No distress.  HENT:  Head: Normocephalic and atraumatic.  Right Ear: Tympanic membrane and ear canal normal.  Left Ear: Tympanic membrane and ear canal normal.  Nose: Right sinus exhibits no maxillary sinus tenderness. Left sinus exhibits no maxillary sinus tenderness.  Mouth/Throat: Uvula is midline and oropharynx is clear and moist.  Eyes: Conjunctivae and EOM are normal. Right eye exhibits no discharge. Left eye exhibits no discharge. No scleral icterus.  Neck: Normal range of motion. Carotid bruit is not present. No erythema present. No  thyromegaly present.  Cardiovascular: Normal rate, regular rhythm, normal heart sounds and normal pulses.  Pulmonary/Chest: Effort normal. No respiratory distress. She has no wheezes. Right breast exhibits no mass, no nipple discharge, no skin change and no tenderness. Left breast exhibits no mass, no nipple discharge, no skin change and no tenderness.  Abdominal: Soft. Bowel sounds are normal. There is no hepatosplenomegaly. There is no tenderness. There is no CVA tenderness.  Genitourinary: Vagina normal and uterus normal. There is no rash, tenderness or lesion on the right labia. There is no rash, tenderness or lesion on the left labia. Cervix exhibits no motion tenderness, no discharge and no friability. Right adnexum displays no mass, no tenderness and no fullness. Left adnexum displays no mass, no tenderness and no fullness.  Musculoskeletal: Normal range of motion.  Lymphadenopathy:    She has no cervical adenopathy.    She has no axillary adenopathy.  Neurological: She is alert and oriented to person, place, and time. She has normal reflexes. No cranial nerve deficit or sensory deficit.  Skin: Skin is warm, dry and intact. No rash noted.  Psychiatric: She has a normal mood and affect. Her speech is normal and behavior is normal. Thought content normal.  Nursing note and vitals reviewed.   BP 110/76   Pulse 69   Ht 5\' 5"  (1.651 m)   Wt 159 lb (72.1 kg)   SpO2 98%   BMI 26.46 kg/m   Assessment and Plan: 1. Annual physical exam Encourage regular exercise - POCT urinalysis dipstick  2. Breast cancer screening - MM 3D SCREEN BREAST BILATERAL; Future  3. Papanicolaou smear for cervical cancer screening - Cytology - PAP  4. Essential hypertension controlled - CBC with Differential/Platelet - Comprehensive metabolic panel - TSH  5. Hyperlipidemia, mixed Check labs - Lipid panel  6. Tobacco use disorder - buPROPion (WELLBUTRIN SR) 150 MG 12 hr tablet; Take 1 tablet (150 mg  total) by mouth 2 (two) times daily.  Dispense: 60 tablet; Refill: 4   Meds ordered this encounter  Medications  . buPROPion (WELLBUTRIN SR) 150 MG 12 hr tablet    Sig: Take 1 tablet (150 mg total) by mouth 2 (two) times daily.    Dispense:  60 tablet    Refill:  4    Partially dictated using Editor, commissioning. Any errors are unintentional.  Halina Maidens, MD [facility]  11/20/2017

## 2017-11-21 LAB — CBC WITH DIFFERENTIAL/PLATELET
Basophils Absolute: 0 10*3/uL (ref 0.0–0.2)
Basos: 0 %
EOS (ABSOLUTE): 0.2 10*3/uL (ref 0.0–0.4)
Eos: 2 %
Hematocrit: 43.2 % (ref 34.0–46.6)
Hemoglobin: 14.7 g/dL (ref 11.1–15.9)
Immature Grans (Abs): 0 10*3/uL (ref 0.0–0.1)
Immature Granulocytes: 0 %
Lymphocytes Absolute: 4.3 10*3/uL — ABNORMAL HIGH (ref 0.7–3.1)
Lymphs: 43 %
MCH: 30.9 pg (ref 26.6–33.0)
MCHC: 34 g/dL (ref 31.5–35.7)
MCV: 91 fL (ref 79–97)
Monocytes Absolute: 0.6 10*3/uL (ref 0.1–0.9)
Monocytes: 6 %
Neutrophils Absolute: 4.9 10*3/uL (ref 1.4–7.0)
Neutrophils: 49 %
Platelets: 333 10*3/uL (ref 150–450)
RBC: 4.76 x10E6/uL (ref 3.77–5.28)
RDW: 13.7 % (ref 12.3–15.4)
WBC: 10 10*3/uL (ref 3.4–10.8)

## 2017-11-21 LAB — COMPREHENSIVE METABOLIC PANEL
ALT: 14 IU/L (ref 0–32)
AST: 17 IU/L (ref 0–40)
Albumin/Globulin Ratio: 1.6 (ref 1.2–2.2)
Albumin: 4.5 g/dL (ref 3.5–5.5)
Alkaline Phosphatase: 100 IU/L (ref 39–117)
BUN/Creatinine Ratio: 13 (ref 9–23)
BUN: 9 mg/dL (ref 6–24)
Bilirubin Total: 0.3 mg/dL (ref 0.0–1.2)
CO2: 23 mmol/L (ref 20–29)
Calcium: 10.1 mg/dL (ref 8.7–10.2)
Chloride: 100 mmol/L (ref 96–106)
Creatinine, Ser: 0.67 mg/dL (ref 0.57–1.00)
GFR calc Af Amer: 115 mL/min/{1.73_m2} (ref >59)
GFR calc non Af Amer: 100 mL/min/{1.73_m2} (ref >59)
Globulin, Total: 2.9 g/dL (ref 1.5–4.5)
Glucose: 93 mg/dL (ref 65–99)
Potassium: 4.9 mmol/L (ref 3.5–5.2)
Sodium: 139 mmol/L (ref 134–144)
Total Protein: 7.4 g/dL (ref 6.0–8.5)

## 2017-11-21 LAB — LIPID PANEL
Chol/HDL Ratio: 4.9 ratio — ABNORMAL HIGH (ref 0.0–4.4)
Cholesterol, Total: 254 mg/dL — ABNORMAL HIGH (ref 100–199)
HDL: 52 mg/dL (ref >39)
LDL Calculated: 178 mg/dL — ABNORMAL HIGH (ref 0–99)
Triglycerides: 122 mg/dL (ref 0–149)
VLDL Cholesterol Cal: 24 mg/dL (ref 5–40)

## 2017-11-21 LAB — CYTOLOGY - PAP
Adequacy: ABSENT
Diagnosis: NEGATIVE

## 2017-11-21 LAB — TSH: TSH: 1.31 u[IU]/mL (ref 0.450–4.500)

## 2017-11-25 ENCOUNTER — Ambulatory Visit
Admission: RE | Admit: 2017-11-25 | Discharge: 2017-11-25 | Disposition: A | Discharge status: Home / Self Care | Payer: BC Managed Care – PPO | Source: Ambulatory Visit | Attending: Internal Medicine | Admitting: Internal Medicine

## 2017-11-25 DIAGNOSIS — Z1231 Encounter for screening mammogram for malignant neoplasm of breast: Secondary | ICD-10-CM | POA: Diagnosis not present

## 2017-11-25 DIAGNOSIS — Z1239 Encounter for other screening for malignant neoplasm of breast: Secondary | ICD-10-CM

## 2017-12-31 ENCOUNTER — Ambulatory Visit
Admission: EM | Admit: 2017-12-31 | Discharge: 2017-12-31 | Disposition: A | Discharge status: Home / Self Care | Payer: BC Managed Care – PPO | Attending: Family Medicine | Admitting: Family Medicine

## 2017-12-31 ENCOUNTER — Other Ambulatory Visit: Payer: Self-pay

## 2017-12-31 ENCOUNTER — Encounter: Payer: Self-pay | Admitting: Emergency Medicine

## 2017-12-31 DIAGNOSIS — F17210 Nicotine dependence, cigarettes, uncomplicated: Secondary | ICD-10-CM

## 2017-12-31 DIAGNOSIS — J0100 Acute maxillary sinusitis, unspecified: Secondary | ICD-10-CM

## 2017-12-31 MED ORDER — AMOXICILLIN-POT CLAVULANATE 875-125 MG PO TABS: 1.0000 | ORAL_TABLET | Freq: Two times a day (BID) | ORAL | 0 refills | Status: DC

## 2017-12-31 NOTE — Discharge Instructions (Addendum)
Take medication as prescribed. Rest. Drink plenty of fluids.  ° °Follow up with your primary care physician this week as needed. Return to Urgent care for new or worsening concerns.  ° °

## 2017-12-31 NOTE — ED Triage Notes (Signed)
Patient in today stating that she had a cold the first of the month, but now having left sided sinus pain/pressure and left ear pain/pressure. Patient denies fever. Patient has tried OTC nasal wash, Advil cold/sinus and robitussin.

## 2017-12-31 NOTE — ED Provider Notes (Signed)
[facility] ____________________________________________  Time seen: Approximately 4:35 PM  I have reviewed the triage vital signs and the nursing notes.   HISTORY  Chief Complaint Otalgia and Nasal Congestion   HPI Cheryl Mooney is a 52 y.o. female presenting for evaluation of sinus pressure and nasal congestion.  Patient reports 3 to 4 weeks ago she began with a runny nose, nasal congestion and cough consistent with a regular cold.  Patient reports cold symptoms lasted for approximately 2 weeks and then improved, but reports she then continued with left-sided sinus pressure.  States she has a history of this happening after a bad cold.  States that she has been using the [employer] pot, over-the-counter cough and congestion medications like Advil cold without resolutions.  States a lot of thick drainage comes out.  States pressure is only in the left sinus area.  Denies dental pain, fevers, paresthesias, facial weakness, rash or swelling.  States same as in the past with unilateral sinus infection.  Reports otherwise feels well, no vision changes.  Denies other aggravating alleviating factors.  States some still occasional cough with associated postnasal drainage.  Denies other aggravating alleviating factors.Denies chest pain, shortness of breath, abdominal pain. Denies recent sickness. Denies recent antibiotic use.   Glean Hess, MD: PCP   Past Medical History:  Diagnosis Date  . Benign neoplasm of sigmoid colon   . Benign neoplasm of transverse colon   . Hypertension   . Smoker     Patient Active Problem List   Diagnosis Date Noted  . Hyperlipidemia, mixed 11/19/2016  . Tubular adenoma of colon 02/21/2015  . Tobacco use disorder 11/27/2014  . Essential hypertension 11/27/2014  . Family history of breast cancer 11/27/2014  . Pap smear abnormality of cervix with ASCUS favoring benign 09/07/2011    Past Surgical History:  Procedure Laterality Date  .  COLONOSCOPY WITH PROPOFOL N/A 02/17/2015   Procedure: COLONOSCOPY WITH PROPOFOL;  Surgeon: Lucilla Lame, MD;  Location: [place];  Service: Endoscopy;  Laterality: N/A;  . POLYPECTOMY  02/17/2015   Procedure: POLYPECTOMY;  Surgeon: Lucilla Lame, MD;  Location: [place];  Service: Endoscopy;;  . WISDOM TOOTH EXTRACTION       No current facility-administered medications for this encounter.   Current Outpatient Medications:  .  buPROPion (WELLBUTRIN SR) 150 MG 12 hr tablet, Take 1 tablet (150 mg total) by mouth 2 (two) times daily., Disp: 60 tablet, Rfl: 4 .  lisinopril-hydrochlorothiazide (PRINZIDE,ZESTORETIC) 20-25 MG tablet, Take 1 tablet by mouth daily., Disp: 90 tablet, Rfl: 3 .  amoxicillin-clavulanate (AUGMENTIN) 875-125 MG tablet, Take 1 tablet by mouth every 12 (twelve) hours., Disp: 20 tablet, Rfl: 0  Allergies Patient has no known allergies.  Family History  Problem Relation Age of Onset  . Colon cancer Mother 33  . Breast cancer Mother 7  . Hypertension Father   . Stroke Father   . Breast cancer Maternal Aunt        mat great aunt  . Breast cancer Maternal Grandmother     Social History Social History   Tobacco Use  . Smoking status: Current Every Day Smoker    Packs/day: 0.50    Years: 20.00    Pack years: 10.00    Types: Cigarettes  . Smokeless tobacco: Never Used  Substance Use Topics  . Alcohol use: Yes    Alcohol/week: 2.0 standard drinks    Types: 2 Standard drinks or equivalent per week    Comment: occasional  .  Drug use: No    Review of Systems Constitutional: No fever ENT: No sore throat. As above.  Cardiovascular: Denies chest pain. Respiratory: Denies shortness of breath. Gastrointestinal: No abdominal pain. Musculoskeletal: Negative for back pain.    ____________________________________________   PHYSICAL EXAM:  VITAL SIGNS: ED Triage Vitals  Enc Vitals Group     BP 12/31/17 1603 (!) 146/91     Pulse Rate  12/31/17 1603 78     Resp 12/31/17 1603 16     Temp 12/31/17 1603 98.4 F (36.9 C)     Temp Source 12/31/17 1603 Oral     SpO2 12/31/17 1603 100 %     Weight 12/31/17 1604 150 lb (68 kg)     Height 12/31/17 1604 5\' 5"  (1.651 m)     Head Circumference --      Peak Flow --      Pain Score 12/31/17 1603 0     Pain Loc --      Pain Edu? --      Excl. in [place]? --     Constitutional: Alert and oriented. Well appearing and in no acute distress. Eyes: Conjunctivae are normal. PERRL. EOMI. Head: Atraumatic.Mild to moderate tenderness to palpation left maxillary sinuses, no right maxillary sinus tenderness palpation.  No frontal sinus tenderness.  No swelling. No erythema.   Ears: no erythema, normal TMs bilaterally.   Nose: nasal congestion with bilateral nasal turbinate erythema and edema.   Mouth/Throat: Mucous membranes are moist.  Oropharynx non-erythematous.No tonsillar swelling or exudate.  Neck: No stridor.  No cervical spine tenderness to palpation. Hematological/Lymphatic/Immunilogical: No cervical lymphadenopathy. Cardiovascular: Normal rate, regular rhythm. Grossly normal heart sounds.  Good peripheral circulation. Respiratory: Normal respiratory effort.  No retractions.No wheezes, rales or rhonchi. Good air movement.  Musculoskeletal: Steady gait. Neurologic:  Normal speech and language.No gait instability.  No focal neurological deficits.  No facial asymmetry.  No paresthesias. Skin:  Skin is warm, dry. No rash noted. Psychiatric: Mood and affect are normal. Speech and behavior are normal.   ___________________________________________   LABS (all labs ordered are listed, but only abnormal results are displayed)  Labs Reviewed - No data to display ____________________________________________   PROCEDURES Procedures    INITIAL IMPRESSION / ASSESSMENT AND PLAN / ED COURSE  Pertinent labs & imaging results that were available during my care of the patient were reviewed by  me and considered in my medical decision making (see chart for details).  Well-appearing patient.  No acute distress.  Recent upper respiratory infection, suspect secondary sinusitis.  Patient reports history of same in past, stating it always affects unilateral left sinus.  Counseled regarding close monitoring for any other coming symptoms.  No focal neurological deficits.  Will treat with oral Augmentin for sinusitis.  Encourage patient for any recurrence follow-up with ENT for further evaluation.Discussed indication, risks and benefits of medications with patient.  Discussed follow up with Primary care physician this week as needed. Discussed follow up and return parameters including no resolution or any worsening concerns. Patient verbalized understanding and agreed to plan.   ____________________________________________   FINAL CLINICAL IMPRESSION(S) / ED DIAGNOSES  Final diagnoses:  Acute maxillary sinusitis, recurrence not specified     ED Discharge Orders         Ordered    amoxicillin-clavulanate (AUGMENTIN) 875-125 MG tablet  Every 12 hours     12/31/17 1631           Note: This dictation was prepared with Dragon dictation  along with smaller phrase technology. Any transcriptional errors that result from this process are unintentional.         Marylene Land, NP 12/31/17 1639

## 2018-02-20 ENCOUNTER — Ambulatory Visit
Admission: EM | Admit: 2018-02-20 | Discharge: 2018-02-20 | Disposition: A | Discharge status: Home / Self Care | Payer: BC Managed Care – PPO | Attending: Family Medicine | Admitting: Family Medicine

## 2018-02-20 ENCOUNTER — Other Ambulatory Visit: Payer: Self-pay

## 2018-02-20 ENCOUNTER — Encounter: Payer: Self-pay | Admitting: Emergency Medicine

## 2018-02-20 DIAGNOSIS — J40 Bronchitis, not specified as acute or chronic: Secondary | ICD-10-CM

## 2018-02-20 DIAGNOSIS — F17210 Nicotine dependence, cigarettes, uncomplicated: Secondary | ICD-10-CM

## 2018-02-20 MED ORDER — DOXYCYCLINE HYCLATE 100 MG PO CAPS: 100.0000 mg | ORAL_CAPSULE | Freq: Two times a day (BID) | ORAL | 0 refills | Status: DC

## 2018-02-20 NOTE — Discharge Instructions (Signed)
Medication as prescribed.  Take care  Dr. Millard Bautch  

## 2018-02-20 NOTE — ED Triage Notes (Signed)
Patient c/o cough and chest congestion since October 20.  Patient denies fevers.

## 2018-02-20 NOTE — ED Provider Notes (Signed)
[facility]   CSN: [id number] Arrival date & time: 02/20/18  1207  History   Chief Complaint Chief Complaint  Patient presents with  . Cough   HPI  52 year old female presents with cough and chest congestion.  Patient states that she has been sick since October 20.  States that she got a cold, improved, and then got reinfected by her husband.  Patient states that her upper respiratory symptoms have essentially resolved.  She does have some mild rhinorrhea.  However, she continues to have cough and congestion which is quite severe.  Has failed to improve.  Has been using over-the-counter cough medicines without improvement.  Mildly productive.  No fever.  No chills.  No other associated symptoms.  PMH, Surgical Hx, Family Hx, Social History reviewed and updated as below.  Past Medical History:  Diagnosis Date  . Benign neoplasm of sigmoid colon   . Benign neoplasm of transverse colon   . Hypertension   . Smoker     Patient Active Problem List   Diagnosis Date Noted  . Hyperlipidemia, mixed 11/19/2016  . Tubular adenoma of colon 02/21/2015  . Tobacco use disorder 11/27/2014  . Essential hypertension 11/27/2014  . Family history of breast cancer 11/27/2014  . Pap smear abnormality of cervix with ASCUS favoring benign 09/07/2011    Past Surgical History:  Procedure Laterality Date  . COLONOSCOPY WITH PROPOFOL N/A 02/17/2015   Procedure: COLONOSCOPY WITH PROPOFOL;  Surgeon: Lucilla Lame, MD;  Location: [place];  Service: Endoscopy;  Laterality: N/A;  . POLYPECTOMY  02/17/2015   Procedure: POLYPECTOMY;  Surgeon: Lucilla Lame, MD;  Location: [place];  Service: Endoscopy;;  . WISDOM TOOTH EXTRACTION      OB History   None     Home Medications    Prior to Admission medications   Medication Sig Start Date End Date Taking? Authorizing Provider  lisinopril-hydrochlorothiazide (PRINZIDE,ZESTORETIC) 20-25 MG tablet Take 1 tablet by mouth  daily. 06/13/17  Yes Glean Hess, MD  buPROPion [facility] SR) 150 MG 12 hr tablet Take 1 tablet (150 mg total) by mouth 2 (two) times daily. 11/20/17   Glean Hess, MD  doxycycline (VIBRAMYCIN) 100 MG capsule Take 1 capsule (100 mg total) by mouth 2 (two) times daily. 02/20/18   Coral Spikes, DO    Family History Family History  Problem Relation Age of Onset  . Colon cancer Mother 35  . Breast cancer Mother 30  . Hypertension Father   . Stroke Father   . Breast cancer Maternal Aunt        mat great aunt  . Breast cancer Maternal Grandmother     Social History Social History   Tobacco Use  . Smoking status: Current Every Day Smoker    Packs/day: 0.50    Years: 20.00    Pack years: 10.00    Types: Cigarettes  . Smokeless tobacco: Never Used  Substance Use Topics  . Alcohol use: Yes    Alcohol/week: 2.0 standard drinks    Types: 2 Standard drinks or equivalent per week    Comment: occasional  . Drug use: No     Allergies   Patient has no known allergies.   Review of Systems Review of Systems  Constitutional: Negative.   HENT: Positive for rhinorrhea.   Respiratory: Positive for cough.    Physical Exam Triage Vital Signs ED Triage Vitals  Enc Vitals Group     BP 02/20/18 1218 (!) 150/91  Pulse Rate 02/20/18 1218 81     Resp 02/20/18 1218 16     Temp 02/20/18 1218 98 F (36.7 C)     Temp Source 02/20/18 1218 Oral     SpO2 02/20/18 1218 99 %     Weight 02/20/18 1216 155 lb (70.3 kg)     Height 02/20/18 1216 5\' 5"  (1.651 m)     Head Circumference --      Peak Flow --      Pain Score 02/20/18 1215 3     Pain Loc --      Pain Edu? --      Excl. in [place]? --    Updated Vital Signs BP (!) 150/91 (BP Location: Left Arm) Comment: Patient states that she has not taken her BP medicine today  Pulse 81   Temp 98 F (36.7 C) (Oral)   Resp 16   Ht 5\' 5"  (1.651 m)   Wt 70.3 kg   SpO2 99%   BMI 25.79 kg/m   Visual Acuity Right Eye Distance:     Left Eye Distance:   Bilateral Distance:    Right Eye Near:   Left Eye Near:    Bilateral Near:     Physical Exam  Constitutional: She is oriented to person, place, and time. She appears well-developed. No distress.  HENT:  Head: Normocephalic and atraumatic.  Cardiovascular: Normal rate and regular rhythm.  Pulmonary/Chest: Effort normal and breath sounds normal. She has no wheezes. She has no rales.  Neurological: She is alert and oriented to person, place, and time.  Psychiatric: She has a normal mood and affect. Her behavior is normal.  Nursing note and vitals reviewed.  UC Treatments / Results  Labs (all labs ordered are listed, but only abnormal results are displayed) Labs Reviewed - No data to display  EKG None  Radiology No results found.  Procedures Procedures (including critical care time)  Medications Ordered in UC Medications - No data to display  Initial Impression / Assessment and Plan / UC Course  I have reviewed the triage vital signs and the nursing notes.  Pertinent labs & imaging results that were available during my care of the patient were reviewed by me and considered in my medical decision making (see chart for details).    52 year old female presents with bronchitis. Treating with Doxycycline.  Final Clinical Impressions(s) / UC Diagnoses   Final diagnoses:  Bronchitis     Discharge Instructions     Medication as prescribed.  Take care  Dr. Lacinda Axon    ED Prescriptions    Medication Sig Dispense Auth. Provider   doxycycline (VIBRAMYCIN) 100 MG capsule Take 1 capsule (100 mg total) by mouth 2 (two) times daily. 14 capsule Coral Spikes, DO     Controlled Substance Prescriptions [place] Controlled Substance Registry consulted? Not Applicable   Coral Spikes, DO 02/20/18 1335

## 2018-02-24 ENCOUNTER — Ambulatory Visit: Payer: BC Managed Care – PPO | Admitting: Internal Medicine

## 2018-07-08 ENCOUNTER — Other Ambulatory Visit: Payer: Self-pay | Admitting: Internal Medicine

## 2018-07-08 DIAGNOSIS — I10 Essential (primary) hypertension: Secondary | ICD-10-CM

## 2018-10-07 ENCOUNTER — Other Ambulatory Visit: Payer: Self-pay | Admitting: Internal Medicine

## 2018-10-07 DIAGNOSIS — I10 Essential (primary) hypertension: Secondary | ICD-10-CM

## 2018-11-23 ENCOUNTER — Encounter: Payer: Self-pay | Admitting: Internal Medicine

## 2018-11-23 ENCOUNTER — Other Ambulatory Visit: Payer: Self-pay

## 2018-11-23 ENCOUNTER — Ambulatory Visit (INDEPENDENT_AMBULATORY_CARE_PROVIDER_SITE_OTHER): Payer: BC Managed Care – PPO | Admitting: Internal Medicine

## 2018-11-23 VITALS — BP 123/76 | HR 73 | Ht 65.0 in | Wt 158.0 lb

## 2018-11-23 DIAGNOSIS — K219 Gastro-esophageal reflux disease without esophagitis: Secondary | ICD-10-CM

## 2018-11-23 DIAGNOSIS — E782 Mixed hyperlipidemia: Secondary | ICD-10-CM

## 2018-11-23 DIAGNOSIS — I10 Essential (primary) hypertension: Secondary | ICD-10-CM

## 2018-11-23 DIAGNOSIS — F17200 Nicotine dependence, unspecified, uncomplicated: Secondary | ICD-10-CM | POA: Diagnosis not present

## 2018-11-23 DIAGNOSIS — N6310 Unspecified lump in the right breast, unspecified quadrant: Secondary | ICD-10-CM | POA: Diagnosis not present

## 2018-11-23 DIAGNOSIS — Z0000 Encounter for general adult medical examination without abnormal findings: Secondary | ICD-10-CM | POA: Diagnosis not present

## 2018-11-23 MED ORDER — OMEPRAZOLE 40 MG PO CPDR: 40.0000 mg | DELAYED_RELEASE_CAPSULE | Freq: Every day | ORAL | 1 refills | Status: DC

## 2018-11-23 MED ORDER — LISINOPRIL-HYDROCHLOROTHIAZIDE 20-25 MG PO TABS: 1.0000 | ORAL_TABLET | Freq: Every day | ORAL | 3 refills | Status: DC

## 2018-11-23 NOTE — Progress Notes (Signed)
Date:  11/23/2018   Name:  Cheryl Mooney   DOB:  1965-11-27   MRN:  [id number]   Chief Complaint: Annual Exam (Breast Exam. Pap in 2 more years.) and Gastroesophageal Reflux (A few days ago had some bad acid reflux. Said she tried tums but did not help. ) Cheryl Mooney is a 53 y.o. female who presents today for her Complete Annual Exam. She feels fairly well. She reports exercising walking the dog. She reports she is sleeping fairly well.  She has noticed a lump in the right breast recently - it is not changing and is not tender.  She continues to smoke - did not benefit from Wellbutrin.  She is not ready to quit at this time.  Mammogram  11/2017 Colonoscopy  02/2015 Pap 11/2017 no cotesting  Hypertension This is a chronic problem. The problem is controlled. Pertinent negatives include no chest pain, headaches, palpitations or shortness of breath. Past treatments include ACE inhibitors and diuretics. The current treatment provides significant improvement.  Gastroesophageal Reflux She complains of heartburn and water brash. She reports no abdominal pain, no chest pain, no coughing, no nausea or no wheezing. This is a new problem. The current episode started 1 to 4 weeks ago. The problem occurs frequently. The problem has been unchanged. The heartburn is located in the substernum. The heartburn is of moderate intensity. The heartburn wakes her from sleep. Nothing aggravates the symptoms. Pertinent negatives include no fatigue. There are no known risk factors. She has tried an antacid for the symptoms. The treatment provided no relief.   Lab Results  Component Value Date   CREATININE 0.67 11/20/2017   BUN 9 11/20/2017   NA 139 11/20/2017   K 4.9 11/20/2017   CL 100 11/20/2017   CO2 23 11/20/2017   Lab Results  Component Value Date   WBC 10.0 11/20/2017   HGB 14.7 11/20/2017   HCT 43.2 11/20/2017   MCV 91 11/20/2017   PLT 333 11/20/2017   Lab Results  Component Value Date   CHOL 254 (H) 11/20/2017   HDL 52 11/20/2017   LDLCALC 178 (H) 11/20/2017   TRIG 122 11/20/2017   CHOLHDL 4.9 (H) 11/20/2017     Review of Systems  Constitutional: Negative for appetite change, chills, fatigue, fever and unexpected weight change.  HENT: Negative for congestion, hearing loss, tinnitus, trouble swallowing and voice change.   Eyes: Negative for visual disturbance.  Respiratory: Negative for cough, chest tightness, shortness of breath and wheezing.   Cardiovascular: Negative for chest pain, palpitations and leg swelling.  Gastrointestinal: Positive for heartburn. Negative for abdominal pain, blood in stool, constipation, diarrhea, nausea and vomiting.       Reflux  Endocrine: Negative for polydipsia and polyuria.  Genitourinary: Negative for dysuria, frequency, genital sores, vaginal bleeding and vaginal discharge.  Musculoskeletal: Negative for arthralgias, gait problem and joint swelling.  Skin: Negative for color change and rash.  Allergic/Immunologic: Negative for environmental allergies.  Neurological: Negative for dizziness, tremors, light-headedness and headaches.  Hematological: Negative for adenopathy. Does not bruise/bleed easily.  Psychiatric/Behavioral: Negative for dysphoric mood and sleep disturbance. The patient is not nervous/anxious.     Patient Active Problem List   Diagnosis Date Noted  . Hyperlipidemia, mixed 11/19/2016  . Tubular adenoma of colon 02/21/2015  . Tobacco use disorder 11/27/2014  . Essential hypertension 11/27/2014  . Family history of breast cancer 11/27/2014  . 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 COLONOSCOPY WITH PROPOFOL N/A 02/17/2015   Procedure: COLONOSCOPY WITH PROPOFOL;  Surgeon: Lucilla Lame, MD;  Location: [place];  Service: Endoscopy;  Laterality: N/A;  . POLYPECTOMY  02/17/2015   Procedure:  POLYPECTOMY;  Surgeon: Lucilla Lame, MD;  Location: [place];  Service: Endoscopy;;  . WISDOM TOOTH EXTRACTION      Social History   Tobacco Use  . Smoking status: Current Every Day Smoker    Packs/day: 0.50    Years: 20.00    Pack years: 10.00    Types: Cigarettes  . Smokeless tobacco: Never Used  Substance Use Topics  . Alcohol use: Yes    Alcohol/week: 2.0 standard drinks    Types: 2 Standard drinks or equivalent per week    Comment: occasional  . Drug use: No     Medication list has been reviewed and updated.  Current Meds  Medication Sig  . lisinopril-hydrochlorothiazide (ZESTORETIC) 20-25 MG tablet Take 1 tablet by mouth once daily  . loratadine (CLARITIN) 10 MG tablet Take 10 mg by mouth daily.    PHQ 2/9 Scores 11/23/2018 11/20/2017 11/18/2016  PHQ - 2 Score 0 0 0  PHQ- 9 Score 0 - -    BP Readings from Last 3 Encounters:  11/23/18 123/76  02/20/18 (!) 150/91  12/31/17 (!) 146/91    Physical Exam Vitals signs and nursing note reviewed.  Constitutional:      General: She is not in acute distress.    Appearance: She is well-developed.  HENT:     Head: Normocephalic and atraumatic.     Right Ear: Tympanic membrane and ear canal normal.     Left Ear: Tympanic membrane and ear canal normal.     Nose:     Right Sinus: No maxillary sinus tenderness.     Left Sinus: No maxillary sinus tenderness.  Eyes:     General: No scleral icterus.       Right eye: No discharge.        Left eye: No discharge.     Conjunctiva/sclera: Conjunctivae normal.  Neck:     Musculoskeletal: Normal range of motion. No erythema.     Thyroid: No thyromegaly.     Vascular: No carotid bruit.  Cardiovascular:     Rate and Rhythm: Normal rate and regular rhythm.     Pulses: Normal pulses.     Heart sounds: Normal heart sounds.  Pulmonary:     Effort: Pulmonary effort is normal. No respiratory distress.     Breath sounds: No wheezing.  Chest:     Breasts:        Right:  Mass present. No nipple discharge, skin change or tenderness.        Left: No mass, nipple discharge, skin change or tenderness.    Abdominal:     General: Abdomen is flat. Bowel sounds are normal.     Palpations: Abdomen is soft.     Tenderness: There is no abdominal tenderness. There is no guarding or rebound.  Musculoskeletal: Normal range of motion.     Right lower leg: No edema.     Left lower leg: No edema.  Lymphadenopathy:     Cervical: No cervical adenopathy.  Skin:    General: Skin is warm and dry.     Capillary Refill: Capillary refill takes less than 2 seconds.     Findings: No rash.  Neurological:  Mental Status: She is alert and oriented to person, place, and time.     Cranial Nerves: No cranial nerve deficit.     Sensory: No sensory deficit.     Deep Tendon Reflexes: Reflexes are normal and symmetric.  Psychiatric:        Attention and Perception: Attention normal.        Mood and Affect: Mood normal.        Speech: Speech normal.        Behavior: Behavior normal.        Thought Content: Thought content normal.     Wt Readings from Last 3 Encounters:  11/23/18 158 lb (71.7 kg)  02/20/18 155 lb (70.3 kg)  12/31/17 150 lb (68 kg)    BP 123/76   Pulse 73   Ht 5\' 5"  (1.651 m)   Wt 158 lb (71.7 kg)   SpO2 97%   BMI 26.29 kg/m   Assessment and Plan: 1. Annual physical exam Normal exam Continue healthy diet, regular exercise  2. Essential hypertension Much improved control which pt attributes to being out of work and reduced stress She has no clinical sx - no chest pain, SOB, orthopnea or edema Will continue same dose of ACE/hctz and maintain low sodium diet - CBC with Differential/Platelet - Comprehensive metabolic panel - TSH - lisinopril-hydrochlorothiazide (ZESTORETIC) 20-25 MG tablet; Take 1 tablet by mouth daily.  Dispense: 90 tablet; Refill: 3  3. Breast mass, right New finding is worrisome - will need DDX mammo and [place] as soon as possible  - MM DIAG BREAST TOMO BILATERAL; Future - US BREAST LTD UNI LEFT INC AXILLA; Future - US BREAST LTD UNI RIGHT INC AXILLA; Future  4. Gastroesophageal reflux disease, esophagitis presence not specified New problem without red flag signs of weight loss, vomiting, melena No dietary changes, new medications contributing Begin PPI daily, titrate down to effective dose If persistent, will consider referral for EGD - omeprazole (PRILOSEC) 40 MG capsule; Take 1 capsule (40 mg total) by mouth daily.  Dispense: 90 capsule; Refill: 1  5. Hyperlipidemia, mixed Check labs and advise on medication Previous 10 yr risk was moderate at 6^ Continue exercise, work on low fat diet and smoking cessation - Lipid panel  6. Tobacco use disorder Pt was unable to quit smoking using bupropion She is not interested in additional assistance at this time   Partially dictated using Editor, commissioning. Any errors are unintentional.  Halina Maidens, MD [facility]  11/23/2018

## 2018-11-24 LAB — CBC WITH DIFFERENTIAL/PLATELET
Basophils Absolute: 0.1 10*3/uL (ref 0.0–0.2)
Basos: 1 %
EOS (ABSOLUTE): 0.3 10*3/uL (ref 0.0–0.4)
Eos: 3 %
Hematocrit: 44.6 % (ref 34.0–46.6)
Hemoglobin: 15.3 g/dL (ref 11.1–15.9)
Immature Grans (Abs): 0 10*3/uL (ref 0.0–0.1)
Immature Granulocytes: 0 %
Lymphocytes Absolute: 4.2 10*3/uL — ABNORMAL HIGH (ref 0.7–3.1)
Lymphs: 45 %
MCH: 30.7 pg (ref 26.6–33.0)
MCHC: 34.3 g/dL (ref 31.5–35.7)
MCV: 89 fL (ref 79–97)
Monocytes Absolute: 0.6 10*3/uL (ref 0.1–0.9)
Monocytes: 6 %
Neutrophils Absolute: 4.3 10*3/uL (ref 1.4–7.0)
Neutrophils: 45 %
Platelets: 329 10*3/uL (ref 150–450)
RBC: 4.99 x10E6/uL (ref 3.77–5.28)
RDW: 12.4 % (ref 11.7–15.4)
WBC: 9.4 10*3/uL (ref 3.4–10.8)

## 2018-11-24 LAB — COMPREHENSIVE METABOLIC PANEL
ALT: 15 IU/L (ref 0–32)
AST: 20 IU/L (ref 0–40)
Albumin/Globulin Ratio: 1.8 (ref 1.2–2.2)
Albumin: 4.7 g/dL (ref 3.8–4.9)
Alkaline Phosphatase: 94 IU/L (ref 39–117)
BUN/Creatinine Ratio: 11 (ref 9–23)
BUN: 9 mg/dL (ref 6–24)
Bilirubin Total: 0.2 mg/dL (ref 0.0–1.2)
CO2: 25 mmol/L (ref 20–29)
Calcium: 9.9 mg/dL (ref 8.7–10.2)
Chloride: 97 mmol/L (ref 96–106)
Creatinine, Ser: 0.79 mg/dL (ref 0.57–1.00)
GFR calc Af Amer: 97 mL/min/{1.73_m2} (ref >59)
GFR calc non Af Amer: 85 mL/min/{1.73_m2} (ref >59)
Globulin, Total: 2.6 g/dL (ref 1.5–4.5)
Glucose: 112 mg/dL — ABNORMAL HIGH (ref 65–99)
Potassium: 5.2 mmol/L (ref 3.5–5.2)
Sodium: 139 mmol/L (ref 134–144)
Total Protein: 7.3 g/dL (ref 6.0–8.5)

## 2018-11-24 LAB — LIPID PANEL
Chol/HDL Ratio: 4.8 ratio — ABNORMAL HIGH (ref 0.0–4.4)
Cholesterol, Total: 281 mg/dL — ABNORMAL HIGH (ref 100–199)
HDL: 59 mg/dL (ref >39)
LDL Calculated: 196 mg/dL — ABNORMAL HIGH (ref 0–99)
Triglycerides: 131 mg/dL (ref 0–149)
VLDL Cholesterol Cal: 26 mg/dL (ref 5–40)

## 2018-11-24 LAB — TSH: TSH: 1.83 u[IU]/mL (ref 0.450–4.500)

## 2018-12-03 ENCOUNTER — Ambulatory Visit
Admission: RE | Admit: 2018-12-03 | Discharge: 2018-12-03 | Disposition: A | Discharge status: Home / Self Care | Payer: BC Managed Care – PPO | Source: Ambulatory Visit | Attending: Internal Medicine | Admitting: Internal Medicine

## 2018-12-03 DIAGNOSIS — N6310 Unspecified lump in the right breast, unspecified quadrant: Secondary | ICD-10-CM

## 2018-12-04 ENCOUNTER — Other Ambulatory Visit: Payer: Self-pay | Admitting: Internal Medicine

## 2018-12-04 ENCOUNTER — Telehealth: Payer: Self-pay

## 2018-12-04 DIAGNOSIS — R928 Other abnormal and inconclusive findings on diagnostic imaging of breast: Secondary | ICD-10-CM

## 2018-12-04 DIAGNOSIS — N6310 Unspecified lump in the right breast, unspecified quadrant: Secondary | ICD-10-CM

## 2018-12-04 DIAGNOSIS — R921 Mammographic calcification found on diagnostic imaging of breast: Secondary | ICD-10-CM

## 2018-12-04 NOTE — Telephone Encounter (Signed)
Patient called asking [place] to expedite orders for Biopsy after learning U/S results. Advised we have signed orders and they should call from [facility] to set up this since she is positive for mass. Will call us if no word by Monday. Also sent and went over the results again.

## 2018-12-08 ENCOUNTER — Ambulatory Visit
Admission: RE | Admit: 2018-12-08 | Discharge: 2018-12-08 | Disposition: A | Discharge status: Home / Self Care | Payer: BC Managed Care – PPO | Source: Ambulatory Visit | Attending: Internal Medicine | Admitting: Internal Medicine

## 2018-12-08 ENCOUNTER — Other Ambulatory Visit: Payer: Self-pay | Admitting: Internal Medicine

## 2018-12-08 DIAGNOSIS — R921 Mammographic calcification found on diagnostic imaging of breast: Secondary | ICD-10-CM | POA: Diagnosis present

## 2018-12-08 DIAGNOSIS — N6310 Unspecified lump in the right breast, unspecified quadrant: Secondary | ICD-10-CM

## 2018-12-08 DIAGNOSIS — R928 Other abnormal and inconclusive findings on diagnostic imaging of breast: Secondary | ICD-10-CM

## 2018-12-08 DIAGNOSIS — N6320 Unspecified lump in the left breast, unspecified quadrant: Secondary | ICD-10-CM | POA: Diagnosis present

## 2018-12-08 HISTORY — PX: BREAST BIOPSY: SHX20

## 2018-12-09 ENCOUNTER — Other Ambulatory Visit: Payer: Self-pay | Admitting: Internal Medicine

## 2018-12-09 DIAGNOSIS — N6310 Unspecified lump in the right breast, unspecified quadrant: Secondary | ICD-10-CM

## 2018-12-10 ENCOUNTER — Encounter: Payer: Self-pay | Admitting: *Deleted

## 2018-12-10 ENCOUNTER — Other Ambulatory Visit: Payer: Self-pay | Admitting: *Deleted

## 2018-12-10 NOTE — Progress Notes (Signed)
I called patient yesterday to introduce her to our navigation services for new diagnosis of breast cancer.  Patient request to be referred to [facility] for surgical and medical oncology.  I sent Dr. Army Melia an inbasket message to make that referral for her patient.  Called patient today to follow up to see if she had gotten an appointment.  She did not answer.  Left message for her to return my call.

## 2018-12-10 NOTE — Progress Notes (Signed)
Patient returned my call.  State she has not heard from [facility] with an appointment yet.  States she has talked with a friend and is thinking about [facility] since they are affiliated with [place].  We talked about Dr. Peyton Najjar and Dr. Lysle Pearl and informed her they do surgery here, and they do work with a Psychiatric nurse out of [place].  She wants an appointment with one of them and will decide to stay here or go to [facility].  Appointment scheduled to see Dr. Peyton Najjar on 12/15/18 @ 9:00.  Patient to keep me informed of her decision.

## 2018-12-15 ENCOUNTER — Encounter: Payer: Self-pay | Admitting: *Deleted

## 2018-12-15 DIAGNOSIS — C50919 Malignant neoplasm of unspecified site of unspecified female breast: Secondary | ICD-10-CM

## 2018-12-16 ENCOUNTER — Telehealth: Payer: Self-pay | Admitting: Licensed Clinical Social Worker

## 2018-12-16 ENCOUNTER — Inpatient Hospital Stay: Payer: BC Managed Care – PPO | Attending: Oncology | Admitting: Oncology

## 2018-12-16 ENCOUNTER — Other Ambulatory Visit: Payer: Self-pay

## 2018-12-16 ENCOUNTER — Encounter: Payer: Self-pay | Admitting: Oncology

## 2018-12-16 VITALS — BP 112/59 | HR 70 | Temp 97.6°F | Resp 16 | Ht 65.0 in | Wt 158.5 lb

## 2018-12-16 DIAGNOSIS — I10 Essential (primary) hypertension: Secondary | ICD-10-CM

## 2018-12-16 DIAGNOSIS — K219 Gastro-esophageal reflux disease without esophagitis: Secondary | ICD-10-CM

## 2018-12-16 DIAGNOSIS — Z170 Estrogen receptor positive status [ER+]: Secondary | ICD-10-CM | POA: Insufficient documentation

## 2018-12-16 DIAGNOSIS — Z79899 Other long term (current) drug therapy: Secondary | ICD-10-CM | POA: Insufficient documentation

## 2018-12-16 DIAGNOSIS — Z800 Family history of malignant neoplasm of digestive organs: Secondary | ICD-10-CM | POA: Diagnosis not present

## 2018-12-16 DIAGNOSIS — Z8481 Family history of carrier of genetic disease: Secondary | ICD-10-CM | POA: Diagnosis not present

## 2018-12-16 DIAGNOSIS — F17210 Nicotine dependence, cigarettes, uncomplicated: Secondary | ICD-10-CM | POA: Insufficient documentation

## 2018-12-16 DIAGNOSIS — Z803 Family history of malignant neoplasm of breast: Secondary | ICD-10-CM | POA: Insufficient documentation

## 2018-12-16 DIAGNOSIS — C50511 Malignant neoplasm of lower-outer quadrant of right female breast: Secondary | ICD-10-CM | POA: Diagnosis not present

## 2018-12-16 DIAGNOSIS — Z8249 Family history of ischemic heart disease and other diseases of the circulatory system: Secondary | ICD-10-CM | POA: Diagnosis not present

## 2018-12-16 NOTE — Progress Notes (Signed)
Opened in error

## 2018-12-16 NOTE — Progress Notes (Signed)
Called and informed patient of her medical oncology appointment with Dr. Tasia Catchings today at 10:45.  She has decided to stay local for her care.

## 2018-12-16 NOTE — Progress Notes (Signed)
Pt new breast cancer, she is concerned about her insurance and if it approves all the things that she may need. I told her that we have staff that checks for scans and chemo and if it is not approved we generally r/s it until we get it approved or change plan. She states knowing that her level of distress is 5 and she just wants to get a plan of action to treat the cancer. She did not sleep last night very well. The surgeon took off steri strips and she was sore.

## 2018-12-16 NOTE — Telephone Encounter (Signed)
Scheduled appointment for Ms. Fauver for 9/14 at 11 am. This will be virtual visit with [facility].

## 2018-12-17 ENCOUNTER — Encounter: Payer: Self-pay | Admitting: Oncology

## 2018-12-17 ENCOUNTER — Encounter: Payer: Self-pay | Admitting: *Deleted

## 2018-12-17 DIAGNOSIS — Z170 Estrogen receptor positive status [ER+]: Secondary | ICD-10-CM

## 2018-12-17 DIAGNOSIS — C50511 Malignant neoplasm of lower-outer quadrant of right female breast: Secondary | ICD-10-CM | POA: Insufficient documentation

## 2018-12-17 HISTORY — DX: Estrogen receptor positive status (ER+): Z17.0

## 2018-12-17 HISTORY — DX: Malignant neoplasm of lower-outer quadrant of right female breast: C50.511

## 2018-12-17 NOTE — Progress Notes (Addendum)
Hematology/Oncology Consult note [facility] Telephone:([phone] [phone]   Patient Care Team: Glean Hess, MD as PCP - General (Internal Medicine) Jannet Mantis, MD (Dermatology)  REFERRING PROVIDER:  CHIEF COMPLAINTS/REASON FOR VISIT:  Evaluation of breast cancer  HISTORY OF PRESENTING ILLNESS:  Cheryl Mooney is a  53 y.o.  female with PMH listed below who was referred to me for evaluation of breast cancer Patient reports feeling mass of her right breast week before her annual mammogram. 12/03/2018 patient had bilateral diagnostic mammogram and ultrasound  which showed suspicious 2.1 x 1.3 x 1.6 irregular mass at 7:00 in the right breast, 2 cm from the nipple. 2 borderline lymph nodes are seen in the right axilla.  1 of the nodes demonstrate a cortex of 4.4 mm.  Both lymph nodes demonstrated retention of fatty hila. Patient underwent ultrasound-guided biopsy of right breast mass as well as ultrasound-guided biopsy of 1 of the 2 mildly abnormal right axillary lymph nodes. 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 Right axilla lymph node negative for malignancy. ER> 90% positive, PR11-50% positive, HER-2 equivocal, 2+ by IHC.  FISH pending.  Nipple discharge: Denies Family history: Mother diagnosed with breast cancer at age of 38, and colon cancer at age of 12.  Mother has BRCA2 mutation.   Maternal grandmother breast cancer, maternal great aunt breast cancer.  Patient recalls that she was tested long time ago and was not aware of any results.  OCP use: In her 20s-30s Estrogen and progesterone therapy: denies History of radiation to chest: denies.  Previous breast surgery: Denies    Review of Systems  Constitutional: Negative for appetite change, chills, fatigue and fever.  HENT:   Negative for hearing loss and voice change.   Eyes: Negative for eye  problems.  Respiratory: Negative for chest tightness and cough.   Cardiovascular: Negative for chest pain.  Gastrointestinal: Negative for abdominal distention, abdominal pain and blood in stool.  Endocrine: Negative for hot flashes.  Genitourinary: Negative for difficulty urinating and frequency.   Musculoskeletal: Negative for arthralgias.  Skin: Negative for itching and rash.  Neurological: Negative for extremity weakness.  Hematological: Negative for adenopathy.  Psychiatric/Behavioral: Negative for confusion. The patient is nervous/anxious.     MEDICAL HISTORY:  Past Medical History:  Diagnosis Date   Benign neoplasm of sigmoid colon    Benign neoplasm of transverse colon    Breast cancer ([place])    Hypertension    Smoker     SURGICAL HISTORY: Past Surgical History:  Procedure Laterality Date   BREAST BIOPSY Right 12/08/2018   [place] bx of mass with calcs path pending   BREAST BIOPSY Right 12/08/2018   [place] bx of LN, path pending   COLONOSCOPY WITH PROPOFOL N/A 02/17/2015   Procedure: COLONOSCOPY WITH PROPOFOL;  Surgeon: Lucilla Lame, MD;  Location: [place];  Service: Endoscopy;  Laterality: N/A;   POLYPECTOMY  02/17/2015   Procedure: POLYPECTOMY;  Surgeon: Lucilla Lame, MD;  Location: [place];  Service: Endoscopy;;   WISDOM TOOTH EXTRACTION      SOCIAL HISTORY: Social History   Socioeconomic History   Marital status: Married    Spouse name: Not on file   Number of children: Not on file   Years of education: Not on file   Highest education level: Not on file  Occupational History   Not on file  Social Needs   Financial resource strain: Not  on file   Food insecurity    Worry: Not on file    Inability: Not on file   Transportation needs    Medical: Not on file    Non-medical: Not on file  Tobacco Use   Smoking status: Current Every Day Smoker    Packs/day: 0.25    Years: 20.00    Pack years: 5.00    Types: Cigarettes    Smokeless tobacco: Never Used   Tobacco comment: 3 cig. per day  Substance and Sexual Activity   Alcohol use: Yes    Alcohol/week: 2.0 standard drinks    Types: 2 Standard drinks or equivalent per week    Comment: occasional   Drug use: No   Sexual activity: Yes  Lifestyle   Physical activity    Days per week: Not on file    Minutes per session: Not on file   Stress: Not on file  Relationships   Social connections    Talks on phone: Not on file    Gets together: Not on file    Attends religious service: Not on file    Active member of club or organization: Not on file    Attends meetings of clubs or organizations: Not on file    Relationship status: Not on file   Intimate partner violence    Fear of current or ex partner: Not on file    Emotionally abused: Not on file    Physically abused: Not on file    Forced sexual activity: Not on file  Other Topics Concern   Not on file  Social History Narrative   Not on file  She is a retired Pharmacist, hospital  FAMILY HISTORY: Family History  Problem Relation Age of Onset   Colon cancer Mother 66   Breast cancer Mother 49   Hypertension Father    Stroke Father    Alzheimer's disease Father    Breast cancer Maternal Aunt        mat great aunt   Breast cancer Maternal Grandmother    Pancreatic cancer Maternal Grandfather     ALLERGIES:  has No Known Allergies.  MEDICATIONS:  Current Outpatient Medications  Medication Sig Dispense Refill   lisinopril-hydrochlorothiazide (ZESTORETIC) 20-25 MG tablet Take 1 tablet by mouth daily. 90 tablet 3   loratadine (CLARITIN) 10 MG tablet Take 10 mg by mouth daily.     omeprazole (PRILOSEC) 40 MG capsule Take 1 capsule (40 mg total) by mouth daily. 90 capsule 1   No current facility-administered medications for this visit.      PHYSICAL EXAMINATION: ECOG PERFORMANCE STATUS: 0 - Asymptomatic Vitals:   12/16/18 1038  BP: (!) 112/59  Pulse: 70  Resp: 16  Temp: 97.6 F  (36.4 C)   Filed Weights   12/16/18 1038  Weight: 158 lb 8 oz (71.9 kg)    Physical Exam Constitutional:      General: She is not in acute distress. HENT:     Head: Normocephalic and atraumatic.  Eyes:     General: No scleral icterus.    Pupils: Pupils are equal, round, and reactive to light.  Neck:     Musculoskeletal: Normal range of motion and neck supple.  Cardiovascular:     Rate and Rhythm: Normal rate and regular rhythm.     Heart sounds: Normal heart sounds.  Pulmonary:     Effort: Pulmonary effort is normal. No respiratory distress.     Breath sounds: No wheezing.  Abdominal:  General: Bowel sounds are normal. There is no distension.     Palpations: Abdomen is soft. There is no mass.     Tenderness: There is no abdominal tenderness.  Musculoskeletal: Normal range of motion.        General: No deformity.  Skin:    General: Skin is warm and dry.     Findings: No erythema or rash.  Neurological:     Mental Status: She is alert and oriented to person, place, and time.     Cranial Nerves: No cranial nerve deficit.     Coordination: Coordination normal.  Psychiatric:        Behavior: Behavior normal.        Thought Content: Thought content normal.    Breast exam was performed in seated and lying down position. Palpable right lower outer quadrant mass.  No palpable right axillary lymph nodes. No palpable left breast mass or left axillary lymph node.  LABORATORY DATA:  I have reviewed the data as listed Lab Results  Component Value Date   WBC 9.4 11/23/2018   HGB 15.3 11/23/2018   HCT 44.6 11/23/2018   MCV 89 11/23/2018   PLT 329 11/23/2018   Recent Labs    11/23/18 1020  NA 139  K 5.2  CL 97  CO2 25  GLUCOSE 112*  BUN 9  CREATININE 0.79  CALCIUM 9.9  GFRNONAA 85  GFRAA 97  PROT 7.3  ALBUMIN 4.7  AST 20  ALT 15  ALKPHOS 94  BILITOT 0.2   Iron/TIBC/Ferritin/ %Sat No results found for: IRON, TIBC, FERRITIN, IRONPCTSAT   RADIOGRAPHIC  STUDIES: I have personally reviewed the radiological images as listed and agreed with the findings in the report. US Breast Ltd Uni Left Inc Axilla  Result Date: 12/08/2018 CLINICAL DATA:  53 year old patient presents today for a biopsy of the right breast and of the right axilla. Since she was last seen here for her diagnostic mammogram and ultrasound on December 03, 2018, she has noticed a palpable lump in the superior left breast, 12-1 o'clock region. This area in the left breast was evaluated today after her biopsies were performed today. EXAM: DIGITAL DIAGNOSTIC LEFT MAMMOGRAM WITH CAD AND TOMO ULTRASOUND LEFT BREAST COMPARISON:  December 03, 2018 ACR Breast Density Category c: The breast tissue is heterogeneously dense, which may obscure small masses. FINDINGS: Metallic skin marker was placed in the region of patient concern in the superior left breast and a spot tangential view of the region of palpable concern was performed. In this region of the left breast, normal fatty breast parenchyma is imaged. There is a normal intramammary lymph node deep to the marker. No suspicious mass, microcalcification, or distortion is seen. Mammographic images were processed with CAD. On physical exam, I palpate soft fat lobules in the far superior left breast at the 1 o'clock region far superiorly, in the region of patient concern. This is approximately 8-9 cm from the nipple. I do not palpate a suspicious mass. Targeted ultrasound is performed, showing normal fatty breast parenchyma in the 1 o'clock region of the left breast 8-9 cm from the nipple in the region of patient concern. No suspicious findings on ultrasound. IMPRESSION: No evidence of malignancy in the region of patient concern in the superior left breast. RECOMMENDATION: Recommendation for the left breast is a mammogram in 1 year. Please note that the patient had right-sided breast biopsies performed today, dictated separately, for which recommendations will  follow after the pathology is available. I  have discussed the findings and recommendations with the patient. Results were also provided in writing at the conclusion of the visit. If applicable, a reminder letter will be sent to the patient regarding the next appointment. BI-RADS CATEGORY  1: Negative. Electronically Signed   By: Curlene Dolphin M.D.   On: 12/08/2018 14:24   US Breast Ltd Uni Right Inc Axilla  Result Date: 12/03/2018 CLINICAL DATA:  Newly palpable lump in the right breast. EXAM: DIGITAL DIAGNOSTIC BILATERAL MAMMOGRAM WITH CAD AND TOMO ULTRASOUND RIGHT BREAST COMPARISON:  Previous exam(s). ACR Breast Density Category c: The breast tissue is heterogeneously dense, which may obscure small masses. FINDINGS: There is a new irregular mass in the right breast measuring approximately 2 cm mammographically. There are calcifications in and anterior to the mass. The calcifications anterior to the mass span 11 mm. The mass and the calcifications taken together span 2.8 cm. Mammographic images were processed with CAD. On physical exam, there is a firm lump in the lateral right breast. Targeted ultrasound is performed, showing an irregular mass containing calcifications in the right breast at 7 o'clock, 2 cm from the nipple measuring 2.1 x 1.3 by 1.6 cm. There are calcifications within the mass. Two borderline lymph nodes are seen in the right axilla. One of the nodes demonstrates a cortex of 4.4 mm. Both lymph nodes demonstrate retention of fatty hila. IMPRESSION: Suspicious mass with calcifications in the right breast as above. Two borderline to mildly abnormal lymph nodes in the right axilla. RECOMMENDATION: Recommend ultrasound-guided biopsy of the right breast mass. Recommend ultrasound-guided biopsy of 1 of the 2 mildly abnormal right axillary lymph nodes. I have discussed the findings and recommendations with the patient. Results were also provided in writing at the conclusion of the visit. If applicable,  a reminder letter will be sent to the patient regarding the next appointment. BI-RADS CATEGORY  5: Highly suggestive of malignancy. Electronically Signed   By: Dorise Bullion III M.D   On: 12/03/2018 15:05   Mm Diag Breast Tomo Uni Left  Result Date: 12/08/2018 CLINICAL DATA:  53 year old patient presents today for a biopsy of the right breast and of the right axilla. Since she was last seen here for her diagnostic mammogram and ultrasound on December 03, 2018, she has noticed a palpable lump in the superior left breast, 12-1 o'clock region. This area in the left breast was evaluated today after her biopsies were performed today. EXAM: DIGITAL DIAGNOSTIC LEFT MAMMOGRAM WITH CAD AND TOMO ULTRASOUND LEFT BREAST COMPARISON:  December 03, 2018 ACR Breast Density Category c: The breast tissue is heterogeneously dense, which may obscure small masses. FINDINGS: Metallic skin marker was placed in the region of patient concern in the superior left breast and a spot tangential view of the region of palpable concern was performed. In this region of the left breast, normal fatty breast parenchyma is imaged. There is a normal intramammary lymph node deep to the marker. No suspicious mass, microcalcification, or distortion is seen. Mammographic images were processed with CAD. On physical exam, I palpate soft fat lobules in the far superior left breast at the 1 o'clock region far superiorly, in the region of patient concern. This is approximately 8-9 cm from the nipple. I do not palpate a suspicious mass. Targeted ultrasound is performed, showing normal fatty breast parenchyma in the 1 o'clock region of the left breast 8-9 cm from the nipple in the region of patient concern. No suspicious findings on ultrasound. IMPRESSION: No evidence of malignancy  in the region of patient concern in the superior left breast. RECOMMENDATION: Recommendation for the left breast is a mammogram in 1 year. Please note that the patient had right-sided  breast biopsies performed today, dictated separately, for which recommendations will follow after the pathology is available. I have discussed the findings and recommendations with the patient. Results were also provided in writing at the conclusion of the visit. If applicable, a reminder letter will be sent to the patient regarding the next appointment. BI-RADS CATEGORY  1: Negative. Electronically Signed   By: Curlene Dolphin M.D.   On: 12/08/2018 14:24   Mm Diag Breast Tomo Bilateral  Result Date: 12/03/2018 CLINICAL DATA:  Newly palpable lump in the right breast. EXAM: DIGITAL DIAGNOSTIC BILATERAL MAMMOGRAM WITH CAD AND TOMO ULTRASOUND RIGHT BREAST COMPARISON:  Previous exam(s). ACR Breast Density Category c: The breast tissue is heterogeneously dense, which may obscure small masses. FINDINGS: There is a new irregular mass in the right breast measuring approximately 2 cm mammographically. There are calcifications in and anterior to the mass. The calcifications anterior to the mass span 11 mm. The mass and the calcifications taken together span 2.8 cm. Mammographic images were processed with CAD. On physical exam, there is a firm lump in the lateral right breast. Targeted ultrasound is performed, showing an irregular mass containing calcifications in the right breast at 7 o'clock, 2 cm from the nipple measuring 2.1 x 1.3 by 1.6 cm. There are calcifications within the mass. Two borderline lymph nodes are seen in the right axilla. One of the nodes demonstrates a cortex of 4.4 mm. Both lymph nodes demonstrate retention of fatty hila. IMPRESSION: Suspicious mass with calcifications in the right breast as above. Two borderline to mildly abnormal lymph nodes in the right axilla. RECOMMENDATION: Recommend ultrasound-guided biopsy of the right breast mass. Recommend ultrasound-guided biopsy of 1 of the 2 mildly abnormal right axillary lymph nodes. I have discussed the findings and recommendations with the patient.  Results were also provided in writing at the conclusion of the visit. If applicable, a reminder letter will be sent to the patient regarding the next appointment. BI-RADS CATEGORY  5: Highly suggestive of malignancy. Electronically Signed   By: Dorise Bullion III M.D   On: 12/03/2018 15:05   Mm Clip Placement Right  Result Date: 12/08/2018 CLINICAL DATA:  Ultrasound-guided core needle biopsy was performed of a suspicious mass in the 7 o'clock position of the right breast and of a right axillary lymph node with mild cortical thickening. EXAM: DIAGNOSTIC RIGHT MAMMOGRAM POST ULTRASOUND BIOPSIES COMPARISON:  Previous exam(s). FINDINGS: Mammographic images were obtained following ultrasound guided biopsy of a suspicious right breast mass at 7 o'clock position. A coil shaped biopsy clip is satisfactorily positioned within the suspicious mass. Again noted are calcifications within the mass. Similar-appearing calcifications extend anterior to the mass. HydroMARK biopsy clip is seen within the right axilla in the expected location of the biopsied lymph node. IMPRESSION: Satisfactory position of coil shaped biopsy clip within the suspicious right breast mass. HydroMARK biopsy clip confirmed in the right axilla, in the expected region of the axillary node biopsy. Final Assessment: Post Procedure Mammograms for Marker Placement Electronically Signed   By: Curlene Dolphin M.D.   On: 12/08/2018 14:27   [place] Rt Breast Bx W Loc Dev 1st Lesion Img Bx Spec US Guide  Addendum Date: 12/11/2018   ADDENDUM REPORT: 12/09/2018 16:08 ADDENDUM: PATHOLOGY revealed: A. BREAST, RIGHT 7:00 2 CM FN; - INVASIVE MAMMARY CARCINOMA, NO SPECIAL  TYPE. 9 mm in this sample. Grade 3. 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 Lymphovascular invasion: Present. B. LYMPH NODE, RIGHT AXILLA; ULTRASOUND-GUIDED BIOPSY: - LYMPH NODE, NEGATIVE FOR MALIGNANCY. Pathology results are CONCORDANT with imaging findings, per Dr. Curlene Dolphin. Pathology  results were discussed with patient via telephone. The patient reported doing well after the biopsy with tenderness at the site. Post biopsy care instructions were reviewed and questions were answered. The patient was encouraged to call [facility] for any additional concerns. Recommendation: Surgical referral. Request for surgical referral was relayed to nurse navigators at [facility] by Electa Sniff RN on 12/09/2018. Addendum by Electa Sniff RN on 12/09/2018. Electronically Signed   By: Curlene Dolphin M.D.   On: 12/09/2018 16:08   Result Date: 12/11/2018 CLINICAL DATA:  Ultrasound-guided core needle biopsy was recommended of a suspicious mass in the 7 o'clock position of the right breast. EXAM: ULTRASOUND GUIDED RIGHT BREAST CORE NEEDLE BIOPSY COMPARISON:  Previous exam(s). FINDINGS: I met with the patient and we discussed the procedure of ultrasound-guided biopsy, including benefits and alternatives. We discussed the high likelihood of a successful procedure. We discussed the risks of the procedure, including infection, bleeding, tissue injury, clip migration, and inadequate sampling. Informed written consent was given. The usual time-out protocol was performed immediately prior to the procedure. Lesion quadrant: Lower outer quadrant Using sterile technique and 1% Lidocaine as local anesthetic, under direct ultrasound visualization, a 12 gauge spring-loaded device was used to perform biopsy of an approximately 2.1 cm mass at 7 o'clock position right breast using a lateral approach. At the conclusion of the procedure a coil tissue marker clip was deployed into the biopsy cavity. Follow up 2 view mammogram was performed and dictated separately. IMPRESSION: Ultrasound guided biopsy of the right breast. No apparent complications. Electronically Signed: By: Curlene Dolphin M.D. On: 12/08/2018 17:03   [place] Rt Breast Bx W Loc Dev Ea Add Lesion Img Bx Spec US Guide  Addendum Date: 12/11/2018     ADDENDUM REPORT: 12/09/2018 16:06 ADDENDUM: PATHOLOGY revealed: A. BREAST, RIGHT 7:00 2 CM FN; - INVASIVE MAMMARY CARCINOMA, NO SPECIAL TYPE. 9 mm in this sample. Grade 3. 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 Lymphovascular invasion: Present. B. LYMPH NODE, RIGHT AXILLA; ULTRASOUND-GUIDED BIOPSY: - LYMPH NODE, NEGATIVE FOR MALIGNANCY. Pathology results are CONCORDANT with imaging findings, per Dr. Curlene Dolphin. Pathology results were discussed with patient via telephone. The patient reported doing well after the biopsy with tenderness at the site. Post biopsy care instructions were reviewed and questions were answered. The patient was encouraged to call [facility] for any additional concerns. Recommendation: Surgical referral. Request for surgical referral was relayed to nurse navigators at [facility] by Electa Sniff RN on 12/09/2018. Addendum by Electa Sniff RN on 12/09/2018. Electronically Signed   By: Curlene Dolphin M.D.   On: 12/09/2018 16:06   Result Date: 12/11/2018 CLINICAL DATA:  Ultrasound-guided core needle biopsy was recommended of a right axillary lymph node. EXAM: [place] AXILLARY NODE CORE BIOPSY RIGHT COMPARISON:  Previous exam(s). FINDINGS: I met with the patient and we discussed the procedure of ultrasound-guided biopsy, including benefits and alternatives. We discussed the high likelihood of a successful procedure. We discussed the risks of the procedure, including infection, bleeding, tissue injury, clip migration, and inadequate sampling. Informed written consent was given. The usual time-out protocol was performed immediately prior to the procedure. Using sterile technique and 1% Lidocaine as local anesthetic, under  direct ultrasound visualization, a 14 gauge spring-loaded device was used to perform biopsy of A RIGHT AXILLARY LYMPH NODE WITH A CORTICAL THICKNESS OF APPROXIMATELY 4 MM using a lateral approach. At the conclusion of the procedure a HydroMARK  tissue marker clip was deployed into the biopsy cavity. Follow up 2 view mammogram was performed and dictated separately. IMPRESSION: Ultrasound guided biopsy of the right axilla. No apparent complications. Electronically Signed: By: Curlene Dolphin M.D. On: 12/08/2018 17:02      ASSESSMENT & PLAN:  1. Malignant neoplasm of lower-outer quadrant of right breast of female, estrogen receptor positive ([place])   2. Family history of breast cancer   3. Family history of BRCA gene positive   Pathology was reviewed.  Mammogram images were independently viewed by me and discussed with patient. Clinical diagnosis of stage I cT2 N0 M0 ER + PR+ right breast cancer was discussed with patient. HER-2 equivocal by IHC and FISH is pending. Discussed with patient that her plan will be dependent on the results.  Family history of breast cancer and family history of BRCA2 gene positive. Genetic testing is recommended.  Will refer her to genetic counseling.  Check baseline CBC, CMP, CA-27-29, CEA 15.3.  After patient's genetic counseling, she will have genetic testing blood work to be drawn along with baseline labs. Patient's case will be discussed on tumor board next week. Follow-up plan to be determined.  #Addendum, discussed with pathology.  HER-2 FISH negative.  Patient has cT2 cN0 M0 ER/PR +, HER2-right breast cancer.  Awaiting genetic counseling. Recommend upfront surgery, lumpectomy with sentinel lymph node biopsy versus mastectomy if patient desires after knowing her genetic testing. Adjuvant chemotherapy will be depending on her final pathology as well as molecular studies.  Orders Placed This Encounter  Procedures   CBC with Differential/Platelet    Standing Status:   Future    Standing Expiration Date:   12/16/2019   Comprehensive metabolic panel    Standing Status:   Future    Standing Expiration Date:   12/16/2019   Cancer antigen 27.29    Standing Status:   Future    Standing Expiration Date:    12/16/2019   Cancer antigen 15-3    Standing Status:   Future    Standing Expiration Date:   12/16/2019   Ambulatory referral to Genetics    Referral Priority:   Urgent    Referral Type:   Consultation    Referral Reason:   Specialty Services Required    Referred to Provider:   Faith Rogue T    Number of Visits Requested:   1    All questions were answered. The patient knows to call the clinic with any problems questions or concerns.  Return of visit: 1 to 2 weeks after surgery. Thank you for this kind referral and the opportunity to participate in the care of this patient. A copy of today's note is routed to referring provider  Total face to face encounter time for this patient visit was 60 min. >50% of the time was  spent in counseling and coordination of care.    Earlie Server, MD, PhD Hematology Oncology [facility] Pager- [phone] 12/17/2018

## 2018-12-17 NOTE — Progress Notes (Signed)
Called patient to follow up afer her initial medical oncology consult yesterday with Dr. Tasia Catchings.  States she is happy with her care here.  She has a genetic consult on Monday.  She is to call with any questions or needs.

## 2018-12-18 ENCOUNTER — Telehealth: Payer: Self-pay | Admitting: Genetic Counselor

## 2018-12-18 NOTE — Telephone Encounter (Signed)
Contacted patient verify webex visit for pre reg

## 2018-12-21 ENCOUNTER — Other Ambulatory Visit: Payer: Self-pay

## 2018-12-21 ENCOUNTER — Inpatient Hospital Stay: Payer: BC Managed Care – PPO

## 2018-12-21 ENCOUNTER — Inpatient Hospital Stay: Payer: BC Managed Care – PPO | Attending: Genetic Counselor | Admitting: Licensed Clinical Social Worker

## 2018-12-21 ENCOUNTER — Encounter: Payer: Self-pay | Admitting: Licensed Clinical Social Worker

## 2018-12-21 DIAGNOSIS — C50511 Malignant neoplasm of lower-outer quadrant of right female breast: Secondary | ICD-10-CM

## 2018-12-21 DIAGNOSIS — Z8481 Family history of carrier of genetic disease: Secondary | ICD-10-CM

## 2018-12-21 DIAGNOSIS — Z170 Estrogen receptor positive status [ER+]: Secondary | ICD-10-CM

## 2018-12-21 DIAGNOSIS — Z803 Family history of malignant neoplasm of breast: Secondary | ICD-10-CM

## 2018-12-21 DIAGNOSIS — Z800 Family history of malignant neoplasm of digestive organs: Secondary | ICD-10-CM

## 2018-12-21 DIAGNOSIS — Z801 Family history of malignant neoplasm of trachea, bronchus and lung: Secondary | ICD-10-CM | POA: Insufficient documentation

## 2018-12-21 NOTE — Progress Notes (Signed)
REFERRING PROVIDER: Earlie Server, MD [place],  [place] [address]  PRIMARY PROVIDER:  Glean Hess, MD  PRIMARY REASON FOR VISIT:  1. Malignant neoplasm of lower-outer quadrant of right breast of female, estrogen receptor positive ([place])   2. Family history of pancreatic cancer   3. Family history of lung cancer   4. Family history of breast cancer   5. Family history of BRCA2 gene positive   6. Family history of colon cancer     I connected with Cheryl Mooney on 12/21/2018 at 11:00 AM EDT by Webex and verified that I am speaking with the correct person using two identifiers.    Patient location: home Provider location: clinic  HISTORY OF PRESENT ILLNESS:   Cheryl Mooney, a 53 y.o. female, was seen for a [place] cancer genetics consultation at the request of Dr. Tasia Catchings due to her recent diagnosis of breast cancer, family history of cancer and family history of BRCA2 mutation.  Cheryl Mooney presents to clinic today to discuss the possibility of a hereditary predisposition to cancer, genetic testing, and to further clarify her future cancer risks, as well as potential cancer risks for family members.   In 2020, at the age of 20, Cheryl Mooney was diagnosed with [address] of the right breast, ER/PR+, Her2 pending. The treatment plan is still being determined based on Her2 and genetic test results.   CANCER HISTORY:  Oncology History   No history exists.     RISK FACTORS:  Menarche was at age 40.  First live birth at age no children.  OCP use for approximately 10 years.  Ovaries intact: yes.  Hysterectomy: no.  HRT use: 0 years. Colonoscopy: yes; she reports approximately 3 polyps. Mammogram within the last year: yes. Up to date with pelvic exams: yes. Any excessive radiation exposure in the past: no  Past Medical History:  Diagnosis Date  . Benign neoplasm of sigmoid colon   . Benign neoplasm of transverse colon   . Breast cancer ([place])   . Family history of BRCA2 gene  positive   . Family history of breast cancer   . Family history of colon cancer   . Family history of lung cancer   . Family history of pancreatic cancer   . Hypertension   . Malignant neoplasm of lower-outer quadrant of right breast of female, estrogen receptor positive ([place]) 12/17/2018  . Smoker     Past Surgical History:  Procedure Laterality Date  . BREAST BIOPSY Right 12/08/2018   [place] bx of mass with calcs path pending  . BREAST BIOPSY Right 12/08/2018   [place] bx of LN, path pending  . COLONOSCOPY WITH PROPOFOL N/A 02/17/2015   Procedure: COLONOSCOPY WITH PROPOFOL;  Surgeon: Lucilla Lame, MD;  Location: [place];  Service: Endoscopy;  Laterality: N/A;  . POLYPECTOMY  02/17/2015   Procedure: POLYPECTOMY;  Surgeon: Lucilla Lame, MD;  Location: [place];  Service: Endoscopy;;  . WISDOM TOOTH EXTRACTION      Social History   Socioeconomic History  . Marital status: Married    Spouse name: Not on file  . Number of children: Not on file  . Years of education: Not on file  . Highest education level: Not on file  Occupational History  . Not on file  Social Needs  . Financial resource strain: Not on file  . Food insecurity    Worry: Not on file    Inability: Not on file  . Transportation needs  Medical: Not on file    Non-medical: Not on file  Tobacco Use  . Smoking status: Current Every Day Smoker    Packs/day: 0.25    Years: 20.00    Pack years: 5.00    Types: Cigarettes  . Smokeless tobacco: Never Used  . Tobacco comment: 3 cig. per day  Substance and Sexual Activity  . Alcohol use: Yes    Alcohol/week: 2.0 standard drinks    Types: 2 Standard drinks or equivalent per week    Comment: occasional  . Drug use: No  . Sexual activity: Yes  Lifestyle  . Physical activity    Days per week: Not on file    Minutes per session: Not on file  . Stress: Not on file  Relationships  . Social Herbalist on phone: Not on file    Gets together:  Not on file    Attends religious service: Not on file    Active member of club or organization: Not on file    Attends meetings of clubs or organizations: Not on file    Relationship status: Not on file  Other Topics Concern  . Not on file  Social History Narrative  . Not on file     FAMILY HISTORY:  We obtained a detailed, 4-generation family history.  Significant diagnoses are listed below: Family History  Problem Relation Age of Onset  . Colon cancer Mother 51  . Breast cancer Mother 1       "BRCA2+"  . Hypertension Father   . Stroke Father   . Alzheimer's disease Father   . Prostate cancer Father   . Breast cancer Maternal Aunt        mat great aunt  . Breast cancer Maternal Grandmother 35  . Pancreatic cancer Maternal Grandfather 98    Cheryl Mooney does not have children. She has 1 brother, Cheryl Mooney, living 42 with no cancer history.   Cheryl Mooney mother had breast cancer at 54 and reportedly had positive BRCA2 genetic test results through a research study in 1996. She is unsure if she has a copy of this information to share. Her mother was also diagnosed with colon cancer at 75. Patient had 1 maternal uncle, no cancers. This uncle has a daughter who was recently diagnosed with NET of the lung. Patient's maternal grandmother was diagnosed with breast cancer at 30. Patient's maternal grandfather was diagnosed with pancreatic at 70.  Ms. Hoffmeister father was diagnosed with prostate cancer at 40 and died at 48. Patient has 5 maternal aunts, 3 maternal uncles, most are living with ages ranging from 41-85, no cancers. No known cancers in paternal cousins. No cancers in paternal grandparents.  Ms. Tassinari is aware of previous family history of genetic testing for hereditary cancer risks. Patient's maternal ancestors are of [place] descent, and paternal ancestors are of [place] descent. She reports her brother had Ancestry testing that showed 4% Ashkenazi Jewish ancestry. There is no  known consanguinity.  GENETIC COUNSELING ASSESSMENT: Cheryl Mooney is a 53 y.o. female with a family history of a BRCA2 mutation. We, therefore, discussed and recommended the following at today's visit.   DISCUSSION: We discussed that 5 - 10% of breast cancer is hereditary, with most cases associated with BRCA1/BRCA2 mutations. We already know there is hereditary cancer in her family because of her mother's BRCA2 mutation. There is a 50% chance she inherited this mutation. There are other genes that can be associated with hereditary cancer syndromes as  well. We discussed that testing is beneficial for several reasons including surgical decision-making for breast cancer, knowing how to follow individuals after completing their treatment, and understand if other family members could be at risk for cancer and allow them to undergo genetic testing.   We reviewed the characteristics, features and inheritance patterns of hereditary cancer syndromes. We also discussed genetic testing, including the appropriate family members to test, the process of testing, insurance coverage and turn-around-time for results. We discussed the implications of a negative, positive and/or variant of uncertain significant result. In order to get genetic test results in a timely manner so that Cheryl Mooney can use these genetic test results for surgical decisions, we recommended Cheryl Mooney pursue genetic testing for the [facility] Breast Cancer STAT panel. Once complete, we recommend Cheryl Mooney pursue reflex genetic testing to the Common hereditary Cancers gene panel.   The STAT Breast cancer panel offered by [facility] includes sequencing and rearrangement analysis for the following 9 genes:  ATM, BRCA1, BRCA2, CDH1, CHEK2, PALB2, PTEN, STK11 and TP53.    The Common Hereditary Cancers Panel offered by [facility] includes sequencing and/or deletion duplication testing of the following 48 genes: APC, ATM, AXIN2, BARD1, BMPR1A, BRCA1, BRCA2,  BRIP1, CDH1, CDKN2A (p14ARF), CDKN2A (p16INK4a), CKD4, CHEK2, CTNNA1, DICER1, EPCAM (Deletion/duplication testing only), GREM1 (promoter region deletion/duplication testing only), KIT, MEN1, MLH1, MSH2, MSH3, MSH6, MUTYH, NBN, NF1, NHTL1, PALB2, PDGFRA, PMS2, POLD1, POLE, PTEN, RAD50, RAD51C, RAD51D, RNF43, SDHB, SDHC, SDHD, SMAD4, SMARCA4. STK11, TP53, TSC1, TSC2, and VHL.  The following genes were evaluated for sequence changes only: SDHA and HOXB13 c.251G>A variant only.  Based on Cheryl Mooney's personal and family history of cancer, she meets medical criteria for genetic testing. Despite that she meets criteria, she may still have an out of pocket cost.    PLAN: After considering the risks, benefits, and limitations, Cheryl Mooney provided informed consent to pursue genetic testing and the blood sample was sent to [facility] for analysis of the STAT Breast Cancer Panel. Results should be available within approximately 5-12 days' time, at which point they will be disclosed by telephone to Cheryl Mooney, as will any additional recommendations warranted by these results. Cheryl Mooney will receive a summary of her genetic counseling visit and a copy of her results once available. This information will also be available in Epic.   Based on Ms. Delatorre's family history, we recommended her maternal relatives have genetic counseling and testing. Ms. Zamorano will let us know if we can be of any assistance in coordinating genetic counseling and/or testing for these family members.  Lastly, we encouraged Ms. Munro to remain in contact with cancer genetics annually so that we can continuously update the family history and inform her of any changes in cancer genetics and testing that may be of benefit for this family.   Ms. Ricciardelli questions were answered to her satisfaction today. Our contact information was provided should additional questions or concerns arise. Thank you for the referral and allowing [place]  to share in the care of your patient.   Faith Rogue, MS, [facility] Genetic Counselor [email or web address] Phone: [phone]  The patient was seen for a total of 45 minutes in virtual genetic counseling.  Drs. Magrinat, Lindi Adie and/or Burr Medico were available for discussion regarding this case.   _______________________________________________________________________ For Office Staff:  Number of people involved in session: 1 Was an Intern/ student involved with case: no

## 2018-12-23 ENCOUNTER — Encounter: Payer: Self-pay | Admitting: Oncology

## 2018-12-23 LAB — SURGICAL PATHOLOGY

## 2018-12-24 ENCOUNTER — Telehealth: Payer: Self-pay | Admitting: *Deleted

## 2018-12-24 NOTE — Telephone Encounter (Signed)
Cheryl Mooney, please let patient know that HER 2 FISH is negative and I recommend upfront surgery.  She will follow up with me 2 weeks after surgery to discuss final pathology and adjuvant plan thanks.

## 2018-12-24 NOTE — Telephone Encounter (Signed)
Patient called asking return call to discuss [employer] results [phone]

## 2018-12-25 ENCOUNTER — Encounter: Payer: Self-pay | Admitting: *Deleted

## 2018-12-25 NOTE — Telephone Encounter (Signed)
Called and talked to patient today.  I reviewed that her Her2 was negative, and that Dr. Tasia Catchings recommends to move forward with surgery, and she will follow up with her 2 weeks after, to review final treatment plan.  Patient is waiting on her genetic test results.  States she has been tracking those and the specimen is in the lab. She is planning surgery after she gets her results.  She is to call us with her results so we can assist with getting her surgery scheduled.  I will cc Dr. Peyton Najjar this message also.

## 2018-12-25 NOTE — Progress Notes (Signed)
Patient called me back today and states she is thinking she wants a mastectomy with reconstruction even if BRCA results are negative.  She would like a referral to plastic surgery.  I have left Judeen Hammans, at Dr. Deniece Ree office a message to make the referral and notify patient.  Patient is to call with any questions or needs.

## 2018-12-30 ENCOUNTER — Telehealth: Payer: Self-pay | Admitting: Licensed Clinical Social Worker

## 2018-12-30 ENCOUNTER — Encounter: Payer: Self-pay | Admitting: Licensed Clinical Social Worker

## 2018-12-30 DIAGNOSIS — Z5111 Encounter for antineoplastic chemotherapy: Secondary | ICD-10-CM | POA: Insufficient documentation

## 2018-12-30 DIAGNOSIS — Z1501 Genetic susceptibility to malignant neoplasm of breast: Secondary | ICD-10-CM | POA: Insufficient documentation

## 2018-12-30 DIAGNOSIS — Z1379 Encounter for other screening for genetic and chromosomal anomalies: Secondary | ICD-10-CM | POA: Insufficient documentation

## 2018-12-30 HISTORY — DX: Encounter for antineoplastic chemotherapy: Z51.11

## 2018-12-30 NOTE — Telephone Encounter (Signed)
Called patient to let her know that the STAT Breast Cancer Panel results came back and she did test positive for the BRCA2 mutation. We discussed BRCA2 in detail, including cancers associated, management options and family members to test. The remaining test results should report out soon and I will call her again once I have those. The report has been released to her through [facility]'s portal and a copy will be scanned into Epic. A portion is provided below.

## 2018-12-31 ENCOUNTER — Telehealth: Payer: Self-pay

## 2018-12-31 NOTE — Telephone Encounter (Signed)

## 2019-01-01 ENCOUNTER — Encounter: Payer: Self-pay | Admitting: Oncology

## 2019-01-01 ENCOUNTER — Ambulatory Visit: Payer: Self-pay | Admitting: Licensed Clinical Social Worker

## 2019-01-01 ENCOUNTER — Telehealth: Payer: Self-pay

## 2019-01-01 ENCOUNTER — Encounter: Payer: Self-pay | Admitting: Plastic Surgery

## 2019-01-01 ENCOUNTER — Ambulatory Visit: Payer: BC Managed Care – PPO | Admitting: Plastic Surgery

## 2019-01-01 ENCOUNTER — Other Ambulatory Visit: Payer: Self-pay

## 2019-01-01 ENCOUNTER — Telehealth: Payer: Self-pay | Admitting: Licensed Clinical Social Worker

## 2019-01-01 VITALS — BP 133/86 | HR 68 | Temp 97.7°F | Ht 65.0 in | Wt 158.2 lb

## 2019-01-01 DIAGNOSIS — I10 Essential (primary) hypertension: Secondary | ICD-10-CM | POA: Diagnosis not present

## 2019-01-01 DIAGNOSIS — Z170 Estrogen receptor positive status [ER+]: Secondary | ICD-10-CM

## 2019-01-01 DIAGNOSIS — F17200 Nicotine dependence, unspecified, uncomplicated: Secondary | ICD-10-CM | POA: Diagnosis not present

## 2019-01-01 DIAGNOSIS — Z1501 Genetic susceptibility to malignant neoplasm of breast: Secondary | ICD-10-CM | POA: Diagnosis not present

## 2019-01-01 DIAGNOSIS — Z1379 Encounter for other screening for genetic and chromosomal anomalies: Secondary | ICD-10-CM

## 2019-01-01 DIAGNOSIS — Z1509 Genetic susceptibility to other malignant neoplasm: Secondary | ICD-10-CM

## 2019-01-01 DIAGNOSIS — Z1502 Genetic susceptibility to malignant neoplasm of ovary: Secondary | ICD-10-CM

## 2019-01-01 DIAGNOSIS — C50511 Malignant neoplasm of lower-outer quadrant of right female breast: Secondary | ICD-10-CM

## 2019-01-01 DIAGNOSIS — Z800 Family history of malignant neoplasm of digestive organs: Secondary | ICD-10-CM

## 2019-01-01 DIAGNOSIS — Z801 Family history of malignant neoplasm of trachea, bronchus and lung: Secondary | ICD-10-CM

## 2019-01-01 DIAGNOSIS — Z8481 Family history of carrier of genetic disease: Secondary | ICD-10-CM

## 2019-01-01 NOTE — Telephone Encounter (Signed)
Let Cheryl Mooney know the remainder of her genetic test results came back, and nothing else was identified. She received the results through the [facility] portal and was concerned about Fanconi anemia. We talked through this and I explained that she does not have this, and talked through dominant and recessive inheritance. She knows to call with any questions or concerns.

## 2019-01-01 NOTE — Progress Notes (Addendum)
Patient ID: Cheryl Mooney, female    DOB: 1965/07/13, 53 y.o.   MRN: [id number]   Chief Complaint  Patient presents with  . Breast Cancer    Patient is a 53 year old white female here for a consultation for breast reconstruction.  She was scheduled for her yearly mammogram when prior to the exam she felt a mass on the right breast.  She has a family history of breast cancer in her mother and her grandmother.  She also is aware that her mother was BRCA 2 positive.  She is also BRCA 2 positive. 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  The lymph node was negative for malignancy.  She is 5 feet 5 inches tall and weights is 158 pounds.  Her preoperative bra is a 38 B.  She would like to be around the same size.  She is currently smoking but is trying to stop.     Review of Systems  Constitutional: Negative for activity change and appetite change.  HENT: Negative.   Respiratory: Negative.  Negative for chest tightness and shortness of breath.   Cardiovascular: Negative for leg swelling.  Gastrointestinal: Negative for abdominal distention.  Endocrine: Negative.   Genitourinary: Negative.   Musculoskeletal: Negative.   Skin: Negative for color change and wound.  Hematological: Negative.   Psychiatric/Behavioral: Negative.     Past Medical History:  Diagnosis Date  . Benign neoplasm of sigmoid colon   . Benign neoplasm of transverse colon   . Breast cancer ([place])   . Family history of BRCA2 gene positive   . Family history of breast cancer   . Family history of colon cancer   . Family history of lung cancer   . Family history of pancreatic cancer   . Hypertension   . 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 Smoker     Past Surgical History:  Procedure Laterality Date  . BREAST BIOPSY Right  12/08/2018   [place] bx of mass with calcs path pending  . BREAST BIOPSY Right 12/08/2018   [place] bx of LN, path pending  . COLONOSCOPY WITH PROPOFOL N/A 02/17/2015   Procedure: COLONOSCOPY WITH PROPOFOL;  Surgeon: Lucilla Lame, MD;  Location: [place];  Service: Endoscopy;  Laterality: N/A;  . POLYPECTOMY  02/17/2015   Procedure: POLYPECTOMY;  Surgeon: Lucilla Lame, MD;  Location: [place];  Service: Endoscopy;;  . WISDOM TOOTH EXTRACTION        Current Outpatient Medications:  .  lisinopril-hydrochlorothiazide (ZESTORETIC) 20-25 MG tablet, Take 1 tablet by mouth daily., Disp: 90 tablet, Rfl: 3 .  loratadine (CLARITIN) 10 MG tablet, Take 10 mg by mouth daily., Disp: , Rfl:  .  omeprazole (PRILOSEC) 40 MG capsule, Take 1 capsule (40 mg total) by mouth daily., Disp: 90 capsule, Rfl: 1 .  varenicline (CHANTIX PAK) 0.5 MG X 11 & 1 MG X 42 tablet, Take by mouth 2 (two) times daily. Take one 0.5 mg tablet by mouth once daily for 3 days, then increase to one 0.5 mg tablet twice daily for 4 days, then increase to one 1 mg tablet twice daily., Disp: , Rfl:    Objective:   Vitals:   01/01/19 0804  BP: 133/86  Pulse: 68  Temp: 97.7 F (36.5 C)  SpO2: 100%    Physical Exam Vitals signs and  nursing note reviewed.  Constitutional:      Appearance: Normal appearance.  HENT:     Head: Normocephalic and atraumatic.  Cardiovascular:     Rate and Rhythm: Normal rate.  Pulmonary:     Effort: Pulmonary effort is normal.  Abdominal:     General: Abdomen is flat. There is no distension.     Tenderness: There is no abdominal tenderness.  Musculoskeletal: Normal range of motion.  Skin:    General: Skin is warm.  Neurological:     General: No focal deficit present.     Mental Status: She is alert and oriented to person, place, and time.  Psychiatric:        Mood and Affect: Mood normal.        Behavior: Behavior normal.        Thought Content: Thought content normal.      Assessment & Plan:  Essential hypertension  BRCA2 gene mutation positive in female  Malignant neoplasm of lower-outer quadrant of right breast of female, estrogen receptor positive ([facility])  Tobacco use disorder Pictures were obtained of the patient and placed in the chart with the patient's or guardian's permission. Assessment and Plan:  A long, detailed conversation was had regarding the patient's options for breast reconstruction. Five main points, which are explained to all breast reconstruction patients, were discussed.  1. Breast reconstruction is an optional process.  2. Breast reconstruction is a multi-stage process which involves multiple surgeries spaced several months apart. The entire process can take over one year.  3. The major goal of breast reconstruction is to have the patient look normal in clothing. When naked, there will always be scars.  4. Asymmetries are often present during the reconstruction process. Several operations may be needed, including surgery to the non-cancerous breast, to achieve satisfactory results.  5. No matter the reconstructive method, there are ways that the reconstruction can fail and a secondary reconstructive plan would need to be created.   A general discussion regarding all available methods of breast reconstruction were discussed. The types of reconstructions described included.  1. Tissue expander and implant based reconstruction, both single and multi-stage approaches.  2. Autologous only reconstructions, including free abdominal-tissue based reconstructions.  3. Combination procedures, particularly latissismus dorsi flaps combined with either expanders or implants.  For each of the reconstruction methods mentioned above, the risks, benefits, alternatives, scarring, and recovery time were discussed in great detail. Specific risks detailed included bleeding, infection, hematoma, seroma, scarring, pain, wound healing complications, flap loss, fat  necrosis, capsular contracture, need for implant removal, donor site complications, bulge, hernia, umbilical necrosis, need for urgent reoperation, and need for dressing changes were discussed.   Assessment  Once all reconstruction options were presented, a focused discussion was had regarding the patient's suitability for each of these procedures.  A total of 50 minutes of face-to-face time was spent in this encounter, of which >50% was spent in counseling.  After lengthy discussion the patient decided on bilateral immediate reconstruction with expander and Flex HD placement.  She understands that this is a staged process.  The expanders would be placed at the time of her mastectomy.  She will have a drain on each side. The expanders would be filled with saline about every other week in the clinic until she is her preoperative size.  Approximately 3 months later she would undergo exchange of the expanders for an implant.  She understands that in order to keep the nipple areola complex she needs to   be tobacco -free.  Dr. Windell Moment and I discussed all the above information and will arrange for coordinated surgery.  Likely not keeping the nipples. Could from a plastics standpoint. But again it is risky with smoking.  [place], DO

## 2019-01-01 NOTE — H&P (View-Only) (Signed)
Patient ID: Cheryl Mooney, female    DOB: 1965/07/13, 53 y.o.   MRN: [id number]   Chief Complaint  Patient presents with  . Breast Cancer    Patient is a 53 year old white female here for a consultation for breast reconstruction.  She was scheduled for her yearly mammogram when prior to the exam she felt a mass on the right breast.  She has a family history of breast cancer in her mother and her grandmother.  She also is aware that her mother was BRCA 2 positive.  She is also BRCA 2 positive. 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  The lymph node was negative for malignancy.  She is 5 feet 5 inches tall and weights is 158 pounds.  Her preoperative bra is a 38 B.  She would like to be around the same size.  She is currently smoking but is trying to stop.     Review of Systems  Constitutional: Negative for activity change and appetite change.  HENT: Negative.   Respiratory: Negative.  Negative for chest tightness and shortness of breath.   Cardiovascular: Negative for leg swelling.  Gastrointestinal: Negative for abdominal distention.  Endocrine: Negative.   Genitourinary: Negative.   Musculoskeletal: Negative.   Skin: Negative for color change and wound.  Hematological: Negative.   Psychiatric/Behavioral: Negative.     Past Medical History:  Diagnosis Date  . Benign neoplasm of sigmoid colon   . Benign neoplasm of transverse colon   . Breast cancer ([place])   . Family history of BRCA2 gene positive   . Family history of breast cancer   . Family history of colon cancer   . Family history of lung cancer   . Family history of pancreatic cancer   . Hypertension   . 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 Smoker     Past Surgical History:  Procedure Laterality Date  . BREAST BIOPSY Right  12/08/2018   [place] bx of mass with calcs path pending  . BREAST BIOPSY Right 12/08/2018   [place] bx of LN, path pending  . COLONOSCOPY WITH PROPOFOL N/A 02/17/2015   Procedure: COLONOSCOPY WITH PROPOFOL;  Surgeon: Lucilla Lame, MD;  Location: [facility];  Service: Endoscopy;  Laterality: N/A;  . POLYPECTOMY  02/17/2015   Procedure: POLYPECTOMY;  Surgeon: Lucilla Lame, MD;  Location: [facility];  Service: Endoscopy;;  . WISDOM TOOTH EXTRACTION        Current Outpatient Medications:  .  lisinopril-hydrochlorothiazide (ZESTORETIC) 20-25 MG tablet, Take 1 tablet by mouth daily., Disp: 90 tablet, Rfl: 3 .  loratadine (CLARITIN) 10 MG tablet, Take 10 mg by mouth daily., Disp: , Rfl:  .  omeprazole (PRILOSEC) 40 MG capsule, Take 1 capsule (40 mg total) by mouth daily., Disp: 90 capsule, Rfl: 1 .  varenicline (CHANTIX PAK) 0.5 MG X 11 & 1 MG X 42 tablet, Take by mouth 2 (two) times daily. Take one 0.5 mg tablet by mouth once daily for 3 days, then increase to one 0.5 mg tablet twice daily for 4 days, then increase to one 1 mg tablet twice daily., Disp: , Rfl:    Objective:   Vitals:   01/01/19 0804  BP: 133/86  Pulse: 68  Temp: 97.7 F (36.5 C)  SpO2: 100%    Physical Exam Vitals signs and  nursing note reviewed.  Constitutional:      Appearance: Normal appearance.  HENT:     Head: Normocephalic and atraumatic.  Cardiovascular:     Rate and Rhythm: Normal rate.  Pulmonary:     Effort: Pulmonary effort is normal.  Abdominal:     General: Abdomen is flat. There is no distension.     Tenderness: There is no abdominal tenderness.  Musculoskeletal: Normal range of motion.  Skin:    General: Skin is warm.  Neurological:     General: No focal deficit present.     Mental Status: She is alert and oriented to person, place, and time.  Psychiatric:        Mood and Affect: Mood normal.        Behavior: Behavior normal.        Thought Content: Thought content normal.      Assessment & Plan:  Essential hypertension  BRCA2 gene mutation positive in female  Malignant neoplasm of lower-outer quadrant of right breast of female, estrogen receptor positive ([place])  Tobacco use disorder Pictures were obtained of the patient and placed in the chart with the patient's or guardian's permission. Assessment and Plan:  A long, detailed conversation was had regarding the patient's options for breast reconstruction. Five main points, which are explained to all breast reconstruction patients, were discussed.  1. Breast reconstruction is an optional process.  2. Breast reconstruction is a multi-stage process which involves multiple surgeries spaced several months apart. The entire process can take over one year.  3. The major goal of breast reconstruction is to have the patient look normal in clothing. When naked, there will always be scars.  4. Asymmetries are often present during the reconstruction process. Several operations may be needed, including surgery to the non-cancerous breast, to achieve satisfactory results.  5. No matter the reconstructive method, there are ways that the reconstruction can fail and a secondary reconstructive plan would need to be created.   A general discussion regarding all available methods of breast reconstruction were discussed. The types of reconstructions described included.  1. Tissue expander and implant based reconstruction, both single and multi-stage approaches.  2. Autologous only reconstructions, including free abdominal-tissue based reconstructions.  3. Combination procedures, particularly latissismus dorsi flaps combined with either expanders or implants.  For each of the reconstruction methods mentioned above, the risks, benefits, alternatives, scarring, and recovery time were discussed in great detail. Specific risks detailed included bleeding, infection, hematoma, seroma, scarring, pain, wound healing complications, flap loss, fat  necrosis, capsular contracture, need for implant removal, donor site complications, bulge, hernia, umbilical necrosis, need for urgent reoperation, and need for dressing changes were discussed.   Assessment  Once all reconstruction options were presented, a focused discussion was had regarding the patient's suitability for each of these procedures.  A total of 50 minutes of face-to-face time was spent in this encounter, of which >50% was spent in counseling.  After lengthy discussion the patient decided on bilateral immediate reconstruction with expander and Flex HD placement.  She understands that this is a staged process.  The expanders would be placed at the time of her mastectomy.  She will have a drain on each side. The expanders would be filled with saline about every other week in the clinic until she is her preoperative size.  Approximately 3 months later she would undergo exchange of the expanders for an implant.  She understands that in order to keep the nipple areola complex she needs to  be tobacco -free.  Dr. Windell Moment and I discussed all the above information and will arrange for coordinated surgery.  Likely not keeping the nipples. Could from a plastics standpoint. But again it is risky with smoking.  [place], DO

## 2019-01-01 NOTE — Telephone Encounter (Signed)
Cheryl Mooney called after her visit with Dr. Marla Roe this morning. She is requesting a call back to discuss her options for treatment.

## 2019-01-01 NOTE — Progress Notes (Signed)
HPI:  Cheryl Mooney was previously seen in the [facility] due to a personal and family history of cancer, BRCA2 mutation in her mother, and concerns regarding a hereditary predisposition to cancer. Please refer to our prior cancer genetics clinic note for more information regarding our discussion, assessment and recommendations, at the time. Cheryl Mooney recent genetic test results were disclosed to her, as were recommendations warranted by these results. These results and recommendations are discussed in more detail below.  CANCER HISTORY:  Oncology History   No history exists.    FAMILY HISTORY:  We obtained a detailed, 4-generation family history.  Significant diagnoses are listed below: Family History  Problem Relation Age of Onset  . Colon cancer Mother 35  . Breast cancer Mother 9       "BRCA2+"  . Hypertension Father   . Stroke Father   . Alzheimer's disease Father   . Prostate cancer Father   . Breast cancer Maternal Aunt        mat great aunt  . Breast cancer Maternal Grandmother 51  . Pancreatic cancer Maternal Grandfather 51   Cheryl Mooney does not have children. She has 1 brother, [name], living 59 with no cancer history.   Cheryl Mooney mother had breast cancer at 23 and reportedly had positive BRCA2 genetic test results through a research study in 1996. She is unsure if she has a copy of this information to share. Her mother was also diagnosed with colon cancer at 18. Patient had 1 maternal uncle, no cancers. This uncle has a daughter who was recently diagnosed with NET of the lung. Patient's maternal grandmother was diagnosed with breast cancer at 12. Patient's maternal grandfather was diagnosed with pancreatic at 64.  Cheryl Mooney father was diagnosed with prostate cancer at 63 and died at 55. Patient has 5 maternal aunts, 3 maternal uncles, most are living with ages ranging from 66-85, no cancers. No known cancers in paternal cousins. No cancers in  paternal grandparents.  Cheryl Mooney is aware of previous family history of genetic testing for hereditary cancer risks. Patient's maternal ancestors are of [place] descent, and paternal ancestors are of [place] descent. She reports her brother had Ancestry testing that showed 4% Ashkenazi Jewish ancestry. There is no known consanguinity.  GENETIC TEST RESULTS: Genetic testing reported out on  through the [facility] Breast Cancer STAT Panel + Common Hereditary cancer panel found a single pathogenic variant in BRCA2 called [id number].  The STAT Breast cancer panel offered by [facility] includes sequencing and rearrangement analysis for the following 9 genes:  ATM, BRCA1, BRCA2, CDH1, CHEK2, PALB2, PTEN, STK11 and TP53.    The Common Hereditary Cancers Panel offered by [facility] includes sequencing and/or deletion duplication testing of the following 47 genes: APC, ATM, AXIN2, BARD1, BMPR1A, BRCA1, BRCA2, BRIP1, CDH1, CDKN2A (p14ARF), CDKN2A (p16INK4a), CKD4, CHEK2, CTNNA1, DICER1, EPCAM (Deletion/duplication testing only), GREM1 (promoter region deletion/duplication testing only), KIT, MEN1, MLH1, MSH2, MSH3, MSH6, MUTYH, NBN, NF1, NHTL1, PALB2, PDGFRA, PMS2, POLD1, POLE, PTEN, RAD50, RAD51C, RAD51D,  SDHB, SDHC, SDHD, SMAD4, SMARCA4. STK11, TP53, TSC1, TSC2, and VHL.  The following genes were evaluated for sequence changes only: SDHA and HOXB13 c.251G>A variant only.  The test report has been scanned into EPIC and is located under the Molecular Pathology section of the Results Review tab.  A portion of the result report is included below for reference.       DISCUSSION: BRCA2   We discussed the cancers, inheritance, management asscociated  with BRCA2, and the importance of telling family members about this result.   HBOC syndrome is characterized by an increased lifetime risk for generally adult-onset cancers including breast, contralateral breast, female breast, ovarian, prostate and pancreatic.    Cancers associated with BRCA2:  -Breast cancer, up to a 84% risk (PMID: 9476546 ) -Ovarian cancer, up to a 27% risk (PMID: 5035465) -Pancreatic cancer, 2-7% (PMID: 68127517, 00174944, 96759163, 84665993) -Prostate cancer, elevated (57017793, 90300923) -Melanoma, elevated (PMID: 30076226, 33354562)  Inheritance Hereditary predisposition to cancer due to pathogenic variants in the BRCA2 gene has autosomal dominant inheritance. This means that an individual with a pathogenic variant has a 50% chance of passing the condition on to his/her offspring. Most cases are inherited from a parent, but some cases may occur spontaneously (i.e., an individual with a pathogenic variant has parents who do not have it). Identification of a pathogenic variant  Individuals with a single pathogenic BRCA2 variant are also carriers of autosomal recessive Fanconi anemia. Fanconi anemia is characterized by bone marrow failure with variable additional anomalies, which often include short stature, abnormal skin pigmentation, abnormal thumbs, malformations of the skeletal and central nervous systems, and developmental delay (PMID: 5638937, 34287681). Risk of leukemia and early-onset solid tumors is significantly elevated with this disorder (PMID: 15726203, 55974163, 84536468). For there to be a risk of Fanconi anemia in offspring, both the patient and their partner would each have to carry a pathogenic variant in BRCA2; in this case, the risk to have an affected child is 25%.   Management:    Breast cancer -clinical breast exams every 6-12 months beginning at 85 -age 44-29: annual breast MRI screening with contrast -age 66-75: annual mammogram with consideration of tomosynthesis and breast MRI screening with contrast -For women treated for breast cancer, screening of remaining breast tissue with annual mammography and breast MRI should continue (if they have not had bilateral mastectomy) -Consider option of risk-reducing  mastectomy   Cheryl Mooney is planning double mastectomies.   Ovarian cancer -Recommend risk-reducing salpingo-oopherectomy (RRSO) typically between the ages of 60-40 and upon completion of childbearing (BRCA2: Can delay until 65-45) -For patients who do not elect RRSO, transvaginal ultrasound combined with serum CA-125 for ovarian cancer screening has not been shown to be sufficiently sensitive or specific as to support a positive recommendation, but, although of uncertain benefit, may be considered at clinician's discretion starting at age 85-35 years   Pancreatic cancer -For individuals with a pathogenic/likely pathogenic variant in one of the pancreatic cancer susceptibility genes: -Consider pancreatic cancer screening beginning at age 29 (or 69 years younger than earliest exocrine pancreatic cancer diagnosis in the family, whichever is earlier), for individuals with exocrine pancreatic cancer in 1 or more first or second degree relatives from the same side of (or presumed to be from same side of) family as the identified pathogenic/likely pathogenic variant -The panel does not currently recommend pancreatic cancer screening for carrier of mutations in genes other than STK11 and CDKN2A in the absence of a close family history of exocrine pancreatic cancer   Cheryl Mooney does report that her maternal grandfather had pancreatic cancer, therefore she could consider this screening.   Melanoma -There are no specific NCCN guidelines regarding melanoma screening for individuals with BRCA mutations.  -However, sun protection is recommended, and routine skin exams by a dermatologist can be considered.     These guidelines are based on current NCCN guidelines (NCCN v.1.2021).  These guidelines are subject to change and continually updated and  should be directly referenced for future medical management.      Risk Reduction: There are several things that can be offered to individuals who are carriers for  BRCA mutations that will reduce the risk for getting cancer.     The use of oral contraceptives can lower the risk for ovarian cancer, and, per case control studies, does not significantly increase the risk for breast cancer in BRCA patients.  Case control studies have shown that oral contraceptives can lower the risk for ovarian cancer in women with BRCA mutations. Additionally, a more recent meta-analysis, including one corhort (n=3,181) and one case control study (1,096 cases and 2,878 controls) also showed an inverse correlation between ovarian cancer and ever having used oral contraceptives (OR, 0.58; 95% CI = 0.46-0.73).  Studies on oral contraceptives and breast cancer have been conflicting, with some studies suggesting that there is not an increased risk for breast cancer in BRCA mutation carriers, while others suggest that there could be a risk.  That said, two meta-analysis studies have shown that there is not an increased risk for breast cancer with oral contraceptive use in BRCA1 and BRCA2 carriers.     In individuals who have a prophylactic bilateral salpino-oophorectomy (BSO), the risk for breast cancer is reduced by up to 50%.    FAMILY MEMBERS: It is important that all of Cheryl Mooney's relatives (both men and women) know of the presence of this gene mutation. Site-specific genetic testing can sort out who in the family is at risk and who is not.   Cheryl Mooney brother has a  50% chance to have inherited this mutation. We recommend they have genetic testing for this same mutation, as identifying the presence of this mutation would allow them to also take advantage of risk-reducing measures.   PLAN:   1. These results will be made available to Dr. Tasia Catchings. She would like Dr. Tasia Catchings to follow her long-term for this indication and coordinate screening/prophylactic surgeries.    2. Cheryl Mooney plans to discuss these results with her family and will reach out to [place] if we can be of any assistance in  coordinating genetic testing for any of her relatives. Her brother lives in [place] and she has sent him a copy of her results. Her cousin is also interested in testing.    SUPPORT AND RESOURCES: If Cheryl Mooney is interested in BRCA-specific information and support, there are two groups, Facing Our Risk (www.facingourrisk.com) and Bright Pink (www.brightpink.org) which some people have found useful. They provide opportunities to speak with other individuals from high-risk families. To locate genetic counselors in other cities, visit the website of the [employer] (Artist[employer].se) and Secretary/administrator for a Social worker by zip code.  We encouraged Cheryl Mooney to remain in contact with [place] on an annual basis so we can update her personal and family histories, and let her know of advances in cancer genetics that may benefit the family. Our contact number was provided. Cheryl Mooney questions were answered to her satisfaction today, and she knows she is welcome to call anytime with additional questions.   Cheryl Rogue, Cheryl Mooney Genetic Counselor [email or web address] Phone: [phone]

## 2019-01-05 ENCOUNTER — Other Ambulatory Visit: Payer: Self-pay

## 2019-01-05 ENCOUNTER — Encounter: Payer: Self-pay | Admitting: Oncology

## 2019-01-05 ENCOUNTER — Inpatient Hospital Stay: Payer: BC Managed Care – PPO | Admitting: Oncology

## 2019-01-05 ENCOUNTER — Inpatient Hospital Stay: Payer: BC Managed Care – PPO

## 2019-01-05 VITALS — BP 133/85 | HR 73 | Temp 98.2°F | Resp 16 | Wt 159.4 lb

## 2019-01-05 DIAGNOSIS — C50511 Malignant neoplasm of lower-outer quadrant of right female breast: Secondary | ICD-10-CM | POA: Diagnosis not present

## 2019-01-05 DIAGNOSIS — Z801 Family history of malignant neoplasm of trachea, bronchus and lung: Secondary | ICD-10-CM | POA: Diagnosis not present

## 2019-01-05 DIAGNOSIS — Z803 Family history of malignant neoplasm of breast: Secondary | ICD-10-CM | POA: Diagnosis not present

## 2019-01-05 DIAGNOSIS — Z1501 Genetic susceptibility to malignant neoplasm of breast: Secondary | ICD-10-CM | POA: Diagnosis not present

## 2019-01-05 DIAGNOSIS — Z1509 Genetic susceptibility to other malignant neoplasm: Secondary | ICD-10-CM

## 2019-01-05 DIAGNOSIS — C50919 Malignant neoplasm of unspecified site of unspecified female breast: Secondary | ICD-10-CM

## 2019-01-05 DIAGNOSIS — Z1502 Genetic susceptibility to malignant neoplasm of ovary: Secondary | ICD-10-CM

## 2019-01-05 DIAGNOSIS — Z800 Family history of malignant neoplasm of digestive organs: Secondary | ICD-10-CM

## 2019-01-05 LAB — CBC WITH DIFFERENTIAL/PLATELET
Abs Immature Granulocytes: 0.02 10*3/uL (ref 0.00–0.07)
Basophils Absolute: 0 10*3/uL (ref 0.0–0.1)
Basophils Relative: 0 %
Eosinophils Absolute: 0.2 10*3/uL (ref 0.0–0.5)
Eosinophils Relative: 2 %
HCT: 40.4 % (ref 36.0–46.0)
Hemoglobin: 13.7 g/dL (ref 12.0–15.0)
Immature Granulocytes: 0 %
Lymphocytes Relative: 43 %
Lymphs Abs: 3.9 10*3/uL (ref 0.7–4.0)
MCH: 30.6 pg (ref 26.0–34.0)
MCHC: 33.9 g/dL (ref 30.0–36.0)
MCV: 90.4 fL (ref 80.0–100.0)
Monocytes Absolute: 0.5 10*3/uL (ref 0.1–1.0)
Monocytes Relative: 5 %
Neutro Abs: 4.5 10*3/uL (ref 1.7–7.7)
Neutrophils Relative %: 50 %
Platelets: 315 10*3/uL (ref 150–400)
RBC: 4.47 MIL/uL (ref 3.87–5.11)
RDW: 12.5 % (ref 11.5–15.5)
WBC: 9.1 10*3/uL (ref 4.0–10.5)
nRBC: 0 % (ref 0.0–0.2)

## 2019-01-05 LAB — COMPREHENSIVE METABOLIC PANEL
ALT: 15 U/L (ref 0–44)
AST: 18 U/L (ref 15–41)
Albumin: 4.5 g/dL (ref 3.5–5.0)
Alkaline Phosphatase: 95 U/L (ref 38–126)
Anion gap: 10 (ref 5–15)
BUN: 9 mg/dL (ref 6–20)
CO2: 25 mmol/L (ref 22–32)
Calcium: 9.8 mg/dL (ref 8.9–10.3)
Chloride: 103 mmol/L (ref 98–111)
Creatinine, Ser: 0.63 mg/dL (ref 0.44–1.00)
GFR calc Af Amer: >60 mL/min (ref >60)
GFR calc non Af Amer: >60 mL/min (ref >60)
Glucose, Bld: 101 mg/dL — ABNORMAL HIGH (ref 70–99)
Potassium: 3.6 mmol/L (ref 3.5–5.1)
Sodium: 138 mmol/L (ref 135–145)
Total Bilirubin: 0.4 mg/dL (ref 0.3–1.2)
Total Protein: 7.7 g/dL (ref 6.5–8.1)

## 2019-01-05 NOTE — Progress Notes (Signed)
Patient here today for follow up.  Patient denies any nausea, vomiting, diarrhea, constipation or pain.  

## 2019-01-06 LAB — CANCER ANTIGEN 27.29: CA 27.29: 18.1 U/mL (ref 0.0–38.6)

## 2019-01-06 LAB — CA 125: Cancer Antigen (CA) 125: 13.4 U/mL (ref 0.0–38.1)

## 2019-01-06 LAB — CANCER ANTIGEN 15-3: CA 15-3: 13.5 U/mL (ref 0.0–25.0)

## 2019-01-06 NOTE — Progress Notes (Signed)
Hematology/Oncology Consult note [facility] [phone] [phone]   Patient Care Team: Glean Hess, MD as PCP - General (Internal Medicine) Jannet Mantis, MD (Dermatology)  REFERRING PROVIDER:  CHIEF COMPLAINTS/REASON FOR VISIT:  Evaluation of breast cancer  HISTORY OF PRESENTING ILLNESS:  Cheryl Mooney is a  53 y.o.  female with PMH listed below who was referred to me for evaluation of breast cancer Patient reports feeling mass of her right breast week before her annual mammogram. 12/03/2018 patient had bilateral diagnostic mammogram and ultrasound  which showed suspicious 2.1 x 1.3 x 1.6 irregular mass at 7:00 in the right breast, 2 cm from the nipple. 2 borderline lymph nodes are seen in the right axilla.  1 of the nodes demonstrate a cortex of 4.4 mm.  Both lymph nodes demonstrated retention of fatty hila. Patient underwent ultrasound-guided biopsy of right breast mass as well as ultrasound-guided biopsy of 1 of the 2 mildly abnormal right axillary lymph nodes. 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 Right axilla lymph node negative for malignancy. ER> 90% positive, PR11-50% positive, HER-2 equivocal, 2+ by IHC.  FISH negative.   Nipple discharge: Denies Family history: Mother diagnosed with breast cancer at age of 69, and colon cancer at age of 55.  Mother has BRCA2 mutation.   Maternal grandmother breast cancer, maternal great aunt breast cancer.  Patient recalls that she was tested long time ago and was not aware of any results.  OCP use: In her 20s-30s Estrogen and progesterone therapy: denies History of radiation to chest: denies.  Previous breast surgery: Denies   INTERVAL HISTORY Cheryl Mooney is a 53 y.o. female who has above history reviewed by me today presents for follow up visit for management of stage I breast cancer, ER PR  positive, HER-2 negative. Problems and complaints are listed below: During the interval, patient was referred to discuss with genetic counselor and she proceed with genetic testing. Patient was tested positive for BRCA2.  Patient has questions request a follow-up visit to discuss management of BRCA positivity No new complaints.  She has discussed with surgery that she prefers prophylactic mastectomy for both breasts. She denies any shortness of breath, fever, chills, chest pain, abdominal pain.  Review of Systems  Constitutional: Negative for appetite change, chills, fatigue and fever.  HENT:   Negative for hearing loss and voice change.   Eyes: Negative for eye problems.  Respiratory: Negative for chest tightness and cough.   Cardiovascular: Negative for chest pain.  Gastrointestinal: Negative for abdominal distention, abdominal pain and blood in stool.  Endocrine: Negative for hot flashes.  Genitourinary: Negative for difficulty urinating and frequency.   Musculoskeletal: Negative for arthralgias.  Skin: Negative for itching and rash.  Neurological: Negative for extremity weakness.  Hematological: Negative for adenopathy.  Psychiatric/Behavioral: Negative for confusion. The patient is nervous/anxious.     MEDICAL HISTORY:  Past Medical History:  Diagnosis Date   Benign neoplasm of sigmoid colon    Benign neoplasm of transverse colon    Breast cancer ([place])    Family history of BRCA2 gene positive    Family history of breast cancer    Family history of colon cancer    Family history of lung cancer    Family history of pancreatic cancer    Hypertension    Malignant neoplasm of lower-outer quadrant of right breast of female, estrogen receptor positive ([place]) 12/17/2018   Smoker  SURGICAL HISTORY: Past Surgical History:  Procedure Laterality Date   BREAST BIOPSY Right 12/08/2018   [place] bx of mass with calcs path pending   BREAST BIOPSY Right 12/08/2018   [place] bx of  LN, path pending   COLONOSCOPY WITH PROPOFOL N/A 02/17/2015   Procedure: COLONOSCOPY WITH PROPOFOL;  Surgeon: Lucilla Lame, MD;  Location: [place];  Service: Endoscopy;  Laterality: N/A;   POLYPECTOMY  02/17/2015   Procedure: POLYPECTOMY;  Surgeon: Lucilla Lame, MD;  Location: [facility];  Service: Endoscopy;;   WISDOM TOOTH EXTRACTION      SOCIAL HISTORY: Social History   Socioeconomic History   Marital status: Married    Spouse name: Not on file   Number of children: Not on file   Years of education: Not on file   Highest education level: Not on file  Occupational History   Not on file  Social Needs   Financial resource strain: Not on file   Food insecurity    Worry: Not on file    Inability: Not on file   Transportation needs    Medical: Not on file    Non-medical: Not on file  Tobacco Use   Smoking status: Current Every Day Smoker    Packs/day: 0.25    Years: 20.00    Pack years: 5.00    Types: Cigarettes   Smokeless tobacco: Never Used   Tobacco comment: 3 cig. per day  Substance and Sexual Activity   Alcohol use: Yes    Alcohol/week: 2.0 standard drinks    Types: 2 Standard drinks or equivalent per week    Comment: occasional   Drug use: No   Sexual activity: Yes  Lifestyle   Physical activity    Days per week: Not on file    Minutes per session: Not on file   Stress: Not on file  Relationships   Social connections    Talks on phone: Not on file    Gets together: Not on file    Attends religious service: Not on file    Active member of club or organization: Not on file    Attends meetings of clubs or organizations: Not on file    Relationship status: Not on file   Intimate partner violence    Fear of current or ex partner: Not on file    Emotionally abused: Not on file    Physically abused: Not on file    Forced sexual activity: Not on file  Other Topics Concern   Not on file  Social History Narrative   Not  on file  She is a retired Pharmacist, hospital  FAMILY HISTORY: Family History  Problem Relation Age of Onset   Colon cancer Mother 44   Breast cancer Mother 56       "BRCA2+"   Hypertension Father    Stroke Father    Alzheimer's disease Father    Prostate cancer Father    Breast cancer Maternal Aunt        mat great aunt   Breast cancer Maternal Grandmother 58   Pancreatic cancer Maternal Grandfather 54    ALLERGIES:  has No Known Allergies.  MEDICATIONS:  Current Outpatient Medications  Medication Sig Dispense Refill   lisinopril-hydrochlorothiazide (ZESTORETIC) 20-25 MG tablet Take 1 tablet by mouth daily. 90 tablet 3   omeprazole (PRILOSEC) 40 MG capsule Take 1 capsule (40 mg total) by mouth daily. 90 capsule 1   cetirizine (ZYRTEC) 10 MG tablet Take 10 mg by mouth  daily.     No current facility-administered medications for this visit.      PHYSICAL EXAMINATION: ECOG PERFORMANCE STATUS: 0 - Asymptomatic Vitals:   01/05/19 1421  BP: 133/85  Pulse: 73  Resp: 16  Temp: 98.2 F (36.8 C)   Filed Weights   01/05/19 1421  Weight: 159 lb 6 oz (72.3 kg)    Physical Exam Constitutional:      General: She is not in acute distress. HENT:     Head: Normocephalic and atraumatic.  Eyes:     General: No scleral icterus.    Pupils: Pupils are equal, round, and reactive to light.  Neck:     Musculoskeletal: Normal range of motion and neck supple.  Cardiovascular:     Rate and Rhythm: Normal rate and regular rhythm.     Heart sounds: Normal heart sounds.  Pulmonary:     Effort: Pulmonary effort is normal. No respiratory distress.     Breath sounds: No wheezing.  Abdominal:     General: Bowel sounds are normal. There is no distension.     Palpations: Abdomen is soft. There is no mass.     Tenderness: There is no abdominal tenderness.  Musculoskeletal: Normal range of motion.        General: No deformity.  Skin:    General: Skin is warm and dry.     Findings: No  erythema or rash.  Neurological:     Mental Status: She is alert and oriented to person, place, and time.     Cranial Nerves: No cranial nerve deficit.     Coordination: Coordination normal.  Psychiatric:        Behavior: Behavior normal.        Thought Content: Thought content normal.    LABORATORY DATA:  I have reviewed the data as listed Lab Results  Component Value Date   WBC 9.1 01/05/2019   HGB 13.7 01/05/2019   HCT 40.4 01/05/2019   MCV 90.4 01/05/2019   PLT 315 01/05/2019   Recent Labs    11/23/18 1020 01/05/19 1514  NA 139 138  K 5.2 3.6  CL 97 103  CO2 25 25  GLUCOSE 112* 101*  BUN 9 9  CREATININE 0.79 0.63  CALCIUM 9.9 9.8  GFRNONAA 85 >60  GFRAA 97 >60  PROT 7.3 7.7  ALBUMIN 4.7 4.5  AST 20 18  ALT 15 15  ALKPHOS 94 95  BILITOT 0.2 0.4   Iron/TIBC/Ferritin/ %Sat No results found for: IRON, TIBC, FERRITIN, IRONPCTSAT   RADIOGRAPHIC STUDIES: I have personally reviewed the radiological images as listed and agreed with the findings in the report. US Breast Ltd Uni Left Inc Axilla  Result Date: 12/08/2018 CLINICAL DATA:  53 year old patient presents today for a biopsy of the right breast and of the right axilla. Since she was last seen here for her diagnostic mammogram and ultrasound on December 03, 2018, she has noticed a palpable lump in the superior left breast, 12-1 o'clock region. This area in the left breast was evaluated today after her biopsies were performed today. EXAM: DIGITAL DIAGNOSTIC LEFT MAMMOGRAM WITH CAD AND TOMO ULTRASOUND LEFT BREAST COMPARISON:  December 03, 2018 ACR Breast Density Category c: The breast tissue is heterogeneously dense, which may obscure small masses. FINDINGS: Metallic skin marker was placed in the region of patient concern in the superior left breast and a spot tangential view of the region of palpable concern was performed. In this region of the left breast, normal  fatty breast parenchyma is imaged. There is a normal  intramammary lymph node deep to the marker. No suspicious mass, microcalcification, or distortion is seen. Mammographic images were processed with CAD. On physical exam, I palpate soft fat lobules in the far superior left breast at the 1 o'clock region far superiorly, in the region of patient concern. This is approximately 8-9 cm from the nipple. I do not palpate a suspicious mass. Targeted ultrasound is performed, showing normal fatty breast parenchyma in the 1 o'clock region of the left breast 8-9 cm from the nipple in the region of patient concern. No suspicious findings on ultrasound. IMPRESSION: No evidence of malignancy in the region of patient concern in the superior left breast. RECOMMENDATION: Recommendation for the left breast is a mammogram in 1 year. Please note that the patient had right-sided breast biopsies performed today, dictated separately, for which recommendations will follow after the pathology is available. I have discussed the findings and recommendations with the patient. Results were also provided in writing at the conclusion of the visit. If applicable, a reminder letter will be sent to the patient regarding the next appointment. BI-RADS CATEGORY  1: Negative. Electronically Signed   By: Curlene Dolphin M.D.   On: 12/08/2018 14:24   US Breast Ltd Uni Right Inc Axilla  Result Date: 12/03/2018 CLINICAL DATA:  Newly palpable lump in the right breast. EXAM: DIGITAL DIAGNOSTIC BILATERAL MAMMOGRAM WITH CAD AND TOMO ULTRASOUND RIGHT BREAST COMPARISON:  Previous exam(s). ACR Breast Density Category c: The breast tissue is heterogeneously dense, which may obscure small masses. FINDINGS: There is a new irregular mass in the right breast measuring approximately 2 cm mammographically. There are calcifications in and anterior to the mass. The calcifications anterior to the mass span 11 mm. The mass and the calcifications taken together span 2.8 cm. Mammographic images were processed with CAD. On  physical exam, there is a firm lump in the lateral right breast. Targeted ultrasound is performed, showing an irregular mass containing calcifications in the right breast at 7 o'clock, 2 cm from the nipple measuring 2.1 x 1.3 by 1.6 cm. There are calcifications within the mass. Two borderline lymph nodes are seen in the right axilla. One of the nodes demonstrates a cortex of 4.4 mm. Both lymph nodes demonstrate retention of fatty hila. IMPRESSION: Suspicious mass with calcifications in the right breast as above. Two borderline to mildly abnormal lymph nodes in the right axilla. RECOMMENDATION: Recommend ultrasound-guided biopsy of the right breast mass. Recommend ultrasound-guided biopsy of 1 of the 2 mildly abnormal right axillary lymph nodes. I have discussed the findings and recommendations with the patient. Results were also provided in writing at the conclusion of the visit. If applicable, a reminder letter will be sent to the patient regarding the next appointment. BI-RADS CATEGORY  5: Highly suggestive of malignancy. Electronically Signed   By: Dorise Bullion III M.D   On: 12/03/2018 15:05   Mm Diag Breast Tomo Uni Left  Result Date: 12/08/2018 CLINICAL DATA:  53 year old patient presents today for a biopsy of the right breast and of the right axilla. Since she was last seen here for her diagnostic mammogram and ultrasound on December 03, 2018, she has noticed a palpable lump in the superior left breast, 12-1 o'clock region. This area in the left breast was evaluated today after her biopsies were performed today. EXAM: DIGITAL DIAGNOSTIC LEFT MAMMOGRAM WITH CAD AND TOMO ULTRASOUND LEFT BREAST COMPARISON:  December 03, 2018 ACR Breast Density Category c: The  breast tissue is heterogeneously dense, which may obscure small masses. FINDINGS: Metallic skin marker was placed in the region of patient concern in the superior left breast and a spot tangential view of the region of palpable concern was performed. In  this region of the left breast, normal fatty breast parenchyma is imaged. There is a normal intramammary lymph node deep to the marker. No suspicious mass, microcalcification, or distortion is seen. Mammographic images were processed with CAD. On physical exam, I palpate soft fat lobules in the far superior left breast at the 1 o'clock region far superiorly, in the region of patient concern. This is approximately 8-9 cm from the nipple. I do not palpate a suspicious mass. Targeted ultrasound is performed, showing normal fatty breast parenchyma in the 1 o'clock region of the left breast 8-9 cm from the nipple in the region of patient concern. No suspicious findings on ultrasound. IMPRESSION: No evidence of malignancy in the region of patient concern in the superior left breast. RECOMMENDATION: Recommendation for the left breast is a mammogram in 1 year. Please note that the patient had right-sided breast biopsies performed today, dictated separately, for which recommendations will follow after the pathology is available. I have discussed the findings and recommendations with the patient. Results were also provided in writing at the conclusion of the visit. If applicable, a reminder letter will be sent to the patient regarding the next appointment. BI-RADS CATEGORY  1: Negative. Electronically Signed   By: Curlene Dolphin M.D.   On: 12/08/2018 14:24   Mm Diag Breast Tomo Bilateral  Result Date: 12/03/2018 CLINICAL DATA:  Newly palpable lump in the right breast. EXAM: DIGITAL DIAGNOSTIC BILATERAL MAMMOGRAM WITH CAD AND TOMO ULTRASOUND RIGHT BREAST COMPARISON:  Previous exam(s). ACR Breast Density Category c: The breast tissue is heterogeneously dense, which may obscure small masses. FINDINGS: There is a new irregular mass in the right breast measuring approximately 2 cm mammographically. There are calcifications in and anterior to the mass. The calcifications anterior to the mass span 11 mm. The mass and the  calcifications taken together span 2.8 cm. Mammographic images were processed with CAD. On physical exam, there is a firm lump in the lateral right breast. Targeted ultrasound is performed, showing an irregular mass containing calcifications in the right breast at 7 o'clock, 2 cm from the nipple measuring 2.1 x 1.3 by 1.6 cm. There are calcifications within the mass. Two borderline lymph nodes are seen in the right axilla. One of the nodes demonstrates a cortex of 4.4 mm. Both lymph nodes demonstrate retention of fatty hila. IMPRESSION: Suspicious mass with calcifications in the right breast as above. Two borderline to mildly abnormal lymph nodes in the right axilla. RECOMMENDATION: Recommend ultrasound-guided biopsy of the right breast mass. Recommend ultrasound-guided biopsy of 1 of the 2 mildly abnormal right axillary lymph nodes. I have discussed the findings and recommendations with the patient. Results were also provided in writing at the conclusion of the visit. If applicable, a reminder letter will be sent to the patient regarding the next appointment. BI-RADS CATEGORY  5: Highly suggestive of malignancy. Electronically Signed   By: Dorise Bullion III M.D   On: 12/03/2018 15:05   Mm Clip Placement Right  Result Date: 12/08/2018 CLINICAL DATA:  Ultrasound-guided core needle biopsy was performed of a suspicious mass in the 7 o'clock position of the right breast and of a right axillary lymph node with mild cortical thickening. EXAM: DIAGNOSTIC RIGHT MAMMOGRAM POST ULTRASOUND BIOPSIES COMPARISON:  Previous  exam(s). FINDINGS: Mammographic images were obtained following ultrasound guided biopsy of a suspicious right breast mass at 7 o'clock position. A coil shaped biopsy clip is satisfactorily positioned within the suspicious mass. Again noted are calcifications within the mass. Similar-appearing calcifications extend anterior to the mass. HydroMARK biopsy clip is seen within the right axilla in the expected  location of the biopsied lymph node. IMPRESSION: Satisfactory position of coil shaped biopsy clip within the suspicious right breast mass. HydroMARK biopsy clip confirmed in the right axilla, in the expected region of the axillary node biopsy. Final Assessment: Post Procedure Mammograms for Marker Placement Electronically Signed   By: Curlene Dolphin M.D.   On: 12/08/2018 14:27   [place] Rt Breast Bx W Loc Dev 1st Lesion Img Bx Spec US Guide  Addendum Date: 12/11/2018   ADDENDUM REPORT: 12/09/2018 16:08 ADDENDUM: PATHOLOGY revealed: A. BREAST, RIGHT 7:00 2 CM FN; - INVASIVE MAMMARY CARCINOMA, NO SPECIAL TYPE. 9 mm in this sample. Grade 3. 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 Lymphovascular invasion: Present. B. LYMPH NODE, RIGHT AXILLA; ULTRASOUND-GUIDED BIOPSY: - LYMPH NODE, NEGATIVE FOR MALIGNANCY. Pathology results are CONCORDANT with imaging findings, per Dr. Curlene Dolphin. Pathology results were discussed with patient via telephone. The patient reported doing well after the biopsy with tenderness at the site. Post biopsy care instructions were reviewed and questions were answered. The patient was encouraged to call [facility] for any additional concerns. Recommendation: Surgical referral. Request for surgical referral was relayed to nurse navigators at [facility] by Electa Sniff RN on 12/09/2018. Addendum by Electa Sniff RN on 12/09/2018. Electronically Signed   By: Curlene Dolphin M.D.   On: 12/09/2018 16:08   Result Date: 12/11/2018 CLINICAL DATA:  Ultrasound-guided core needle biopsy was recommended of a suspicious mass in the 7 o'clock position of the right breast. EXAM: ULTRASOUND GUIDED RIGHT BREAST CORE NEEDLE BIOPSY COMPARISON:  Previous exam(s). FINDINGS: I met with the patient and we discussed the procedure of ultrasound-guided biopsy, including benefits and alternatives. We discussed the high likelihood of a successful procedure. We discussed the risks of the  procedure, including infection, bleeding, tissue injury, clip migration, and inadequate sampling. Informed written consent was given. The usual time-out protocol was performed immediately prior to the procedure. Lesion quadrant: Lower outer quadrant Using sterile technique and 1% Lidocaine as local anesthetic, under direct ultrasound visualization, a 12 gauge spring-loaded device was used to perform biopsy of an approximately 2.1 cm mass at 7 o'clock position right breast using a lateral approach. At the conclusion of the procedure a coil tissue marker clip was deployed into the biopsy cavity. Follow up 2 view mammogram was performed and dictated separately. IMPRESSION: Ultrasound guided biopsy of the right breast. No apparent complications. Electronically Signed: By: Curlene Dolphin M.D. On: 12/08/2018 17:03   [place] Rt Breast Bx W Loc Dev Ea Add Lesion Img Bx Spec US Guide  Addendum Date: 12/11/2018   ADDENDUM REPORT: 12/09/2018 16:06 ADDENDUM: PATHOLOGY revealed: A. BREAST, RIGHT 7:00 2 CM FN; - INVASIVE MAMMARY CARCINOMA, NO SPECIAL TYPE. 9 mm in this sample. Grade 3. 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 Lymphovascular invasion: Present. B. LYMPH NODE, RIGHT AXILLA; ULTRASOUND-GUIDED BIOPSY: - LYMPH NODE, NEGATIVE FOR MALIGNANCY. Pathology results are CONCORDANT with imaging findings, per Dr. Curlene Dolphin. Pathology results were discussed with patient via telephone. The patient reported doing well after the biopsy with tenderness at the site. Post biopsy care instructions were reviewed and questions were answered.  The patient was encouraged to call [facility] for any additional concerns. Recommendation: Surgical referral. Request for surgical referral was relayed to nurse navigators at [facility] by Electa Sniff RN on 12/09/2018. Addendum by Electa Sniff RN on 12/09/2018. Electronically Signed   By: Curlene Dolphin M.D.   On: 12/09/2018 16:06   Result Date:  12/11/2018 CLINICAL DATA:  Ultrasound-guided core needle biopsy was recommended of a right axillary lymph node. EXAM: [place] AXILLARY NODE CORE BIOPSY RIGHT COMPARISON:  Previous exam(s). FINDINGS: I met with the patient and we discussed the procedure of ultrasound-guided biopsy, including benefits and alternatives. We discussed the high likelihood of a successful procedure. We discussed the risks of the procedure, including infection, bleeding, tissue injury, clip migration, and inadequate sampling. Informed written consent was given. The usual time-out protocol was performed immediately prior to the procedure. Using sterile technique and 1% Lidocaine as local anesthetic, under direct ultrasound visualization, a 14 gauge spring-loaded device was used to perform biopsy of A RIGHT AXILLARY LYMPH NODE WITH A CORTICAL THICKNESS OF APPROXIMATELY 4 MM using a lateral approach. At the conclusion of the procedure a HydroMARK tissue marker clip was deployed into the biopsy cavity. Follow up 2 view mammogram was performed and dictated separately. IMPRESSION: Ultrasound guided biopsy of the right axilla. No apparent complications. Electronically Signed: By: Curlene Dolphin M.D. On: 12/08/2018 17:02      ASSESSMENT & PLAN:  1. BRCA2 gene mutation positive in female   2. Family history of colon cancer   3. Family history of lung cancer   4. Family history of pancreatic cancer   5. Family history of breast cancer   6. Malignant neoplasm of female breast, unspecified estrogen receptor status, unspecified laterality, unspecified site of breast ([place])    Clinical diagnosis of stage I cT2 N0 M0 ER + PR+ HER2 negative right breast cancer   #BRCA gene mutation positive. Patient has decided to proceed with prophylactic mastectomy for both left and right breast. Recommend patient to proceed with bilateral mastectomy with right sentinel lymph node biopsy. Left sentinel lymph node biopsy can be considered in case patient has  mammogram occult invasive breast cancer in left breast. Patient has meet plastic surgeon as well for immediate reconstruction. Need for adjuvant chemotherapy depends on final pathology.  Plan sent Oncotype DX. Patient did not get blood work done last time. We will check baseline CBC, CMP, CA-27-29, CA 15.3.  #Discussed with patient that she has increased risk of developing breast cancer, gynecology malignancies, pancreatic cancer, melanoma etc. patient also had a discussion with genetic counselor. Recommend risk reducing salpingo-oophorectomy to further her risk. Check CA 125 for ovarian cancer screening. Recommend to GYN Dr. Gilman Schmidt for further evaluation and discussion.  Patient would also consider screening for pancreatic cancer after her breast cancer surgeries.   Orders Placed This Encounter  Procedures   CBC with Differential/Platelet   Comprehensive metabolic panel   Cancer antigen 27.29   CA 125   Cancer Antigen 15-3   Ambulatory referral to Gynecology    Referral Priority:   Routine    Referral Type:   Consultation    Referral Reason:   Specialty Services Required    Requested Specialty:   Gynecology    Number of Visits Requested:   1    All questions were answered. The patient knows to call the clinic with any problems questions or concerns.  Return of visit: 2-3 weeks after surgery. We spent sufficient time to  discuss many aspect of care, questions were answered to patient's satisfaction. Total face to face encounter time for this patient visit was 25 min. >50% of the time was  spent in counseling and coordination of care.     Earlie Server, MD, PhD Hematology Oncology [facility] Pager- [phone] 01/06/2019

## 2019-01-06 NOTE — Progress Notes (Signed)
Heather please arrange follow up in 2-3 weeks. MD only

## 2019-01-07 ENCOUNTER — Other Ambulatory Visit: Payer: Self-pay | Admitting: General Surgery

## 2019-01-07 DIAGNOSIS — C50911 Malignant neoplasm of unspecified site of right female breast: Secondary | ICD-10-CM

## 2019-01-08 ENCOUNTER — Other Ambulatory Visit: Payer: Self-pay

## 2019-01-08 ENCOUNTER — Ambulatory Visit: Payer: Self-pay | Admitting: General Surgery

## 2019-01-08 ENCOUNTER — Other Ambulatory Visit: Payer: Self-pay | Admitting: General Surgery

## 2019-01-08 ENCOUNTER — Encounter
Admission: RE | Admit: 2019-01-08 | Discharge: 2019-01-08 | Disposition: A | Discharge status: Home / Self Care | Payer: BC Managed Care – PPO | Source: Ambulatory Visit | Attending: General Surgery | Admitting: General Surgery

## 2019-01-08 DIAGNOSIS — I10 Essential (primary) hypertension: Secondary | ICD-10-CM | POA: Diagnosis not present

## 2019-01-08 DIAGNOSIS — Z01818 Encounter for other preprocedural examination: Secondary | ICD-10-CM | POA: Diagnosis not present

## 2019-01-08 HISTORY — DX: Personal history of urinary calculi: Z87.442

## 2019-01-08 HISTORY — DX: Gastro-esophageal reflux disease without esophagitis: K21.9

## 2019-01-08 NOTE — Patient Instructions (Addendum)
Your procedure is scheduled on: Thursday, January 14, 2019 Report to Day Surgery on the 2nd floor of the [facility]. To find out your arrival time, please call [phone] between 1PM - 3PM on: Wednesday, January 13, 2019 or Nuclear Medicine Department will call you to tell you a time to arrive.  REMEMBER: Instructions that are not followed completely may result in serious medical risk, up to and including death; or upon the discretion of your surgeon and anesthesiologist your surgery may need to be rescheduled.  Do not eat food after midnight the night before surgery.  No gum chewing, lozengers or hard candies.  You may however, drink CLEAR liquids up to 2 hours before you are scheduled to arrive for your surgery. Do not drink anything within 2 hours of the start of your surgery.  Clear liquids include: - water  - apple juice without pulp - gatorade - black coffee or tea (Do NOT add milk or creamers to the coffee or tea) Do NOT drink anything that is not on this list.  No Alcohol for 24 hours before or after surgery.  No Smoking including e-cigarettes for 24 hours prior to surgery.  No chewable tobacco products for at least 6 hours prior to surgery.  No nicotine patches on the day of surgery.  On the morning of surgery brush your teeth with toothpaste and water, you may rinse your mouth with mouthwash if you wish. Do not swallow any toothpaste or mouthwash.  Notify your doctor if there is any change in your medical condition (cold, fever, infection).  Do not wear jewelry, make-up, hairpins, clips or nail polish.  Do not wear lotions, powders, or perfumes.   Do not shave 48 hours prior to surgery.   Contacts and dentures may not be worn into surgery.  Do not bring valuables to the hospital, including drivers license, insurance or credit cards.   is not responsible for any belongings or valuables.   TAKE THESE MEDICATIONS THE MORNING OF SURGERY:  1.  Omeprazole  - (take one the night before and one on the morning of surgery - helps to prevent nausea after surgery.)  Use CHG Soap as directed on instruction sheet.  NOW!  Stop Anti-inflammatories (NSAIDS) such as Advil, Aleve, Ibuprofen, Motrin, Naproxen, Naprosyn and Aspirin based products such as Excedrin, Goodys Powder, BC Powder. (May take Tylenol or Acetaminophen if needed.)  NOW!  Stop ANY OVER THE COUNTER supplements until after surgery.  Wear comfortable clothing (specific to your surgery type) to the hospital.  Plan for stool softeners for home use.  If you are being admitted to the hospital overnight, leave your suitcase in the car. After surgery it may be brought to your room.  Please call [phone] if you have any questions about these instructions.

## 2019-01-11 ENCOUNTER — Encounter
Admission: RE | Admit: 2019-01-11 | Discharge: 2019-01-11 | Disposition: A | Discharge status: Home / Self Care | Payer: BC Managed Care – PPO | Source: Ambulatory Visit | Attending: General Surgery | Admitting: General Surgery

## 2019-01-11 ENCOUNTER — Inpatient Hospital Stay: Admission: RE | Admit: 2019-01-11 | Payer: BC Managed Care – PPO | Source: Ambulatory Visit

## 2019-01-11 ENCOUNTER — Other Ambulatory Visit: Payer: Self-pay

## 2019-01-11 DIAGNOSIS — Z20828 Contact with and (suspected) exposure to other viral communicable diseases: Secondary | ICD-10-CM | POA: Diagnosis not present

## 2019-01-11 DIAGNOSIS — I10 Essential (primary) hypertension: Secondary | ICD-10-CM | POA: Insufficient documentation

## 2019-01-11 DIAGNOSIS — Z01818 Encounter for other preprocedural examination: Secondary | ICD-10-CM | POA: Diagnosis not present

## 2019-01-11 DIAGNOSIS — Z01810 Encounter for preprocedural cardiovascular examination: Secondary | ICD-10-CM | POA: Diagnosis not present

## 2019-01-11 LAB — SARS CORONAVIRUS 2 (TAT 6-24 HRS): SARS Coronavirus 2: NEGATIVE

## 2019-01-12 ENCOUNTER — Ambulatory Visit: Payer: Self-pay | Admitting: General Surgery

## 2019-01-12 NOTE — H&P (Signed)
PATIENT PROFILE: Cheryl Mooney is a 53 y.o. 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  PCP: Cheryl Mooney  HISTORY OF PRESENT ILLNESS: Cheryl Mooney was seen before after having her yearly mammogram and she felt a mass on the right breast. She denies any breast pain. There is no pain radiation. There is no alleviating or aggravating factor. She denies any skin changes. She denies nipple discharge. Denies any changes or symptoms since last visit. She had genetic testing and was found to be BRCA positive. She now comes for evaluation for bilateral mastectomy.   Patient usually has her yearly mammograms without any problems.  Family history of breast cancer: Cheryl Mooney and Cheryl Mooney. Cheryl Mooney was a BRCA2 positive. Family history of other cancers: Cheryl Mooney with colon cancer with brain metastases Menarche: 52 to 53 years old Menopause: In her 19s Used OCP: In her 45s Used estrogen and progesterone therapy: Denies History of Radiation to the chest: None Number pregnancies: 0 No previous breast biopsy.   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, right (CMS-HCC) 01/07/2019  Malignant neoplasm of lower-outer quadrant of right female breast (CMS-HCC) 01/07/2019    GENERAL REVIEW OF SYSTEMS:   General ROS: negative for - chills, fatigue, fever, weight gain or weight loss Allergy and Immunology ROS: negative for - hives  Hematological and Lymphatic ROS: negative for - bleeding problems or bruising, negative for palpable nodes Endocrine ROS: negative for - heat or cold intolerance, hair changes Respiratory ROS: negative for - cough, shortness of breath or wheezing Cardiovascular ROS: no chest pain or palpitations GI ROS: negative for nausea, vomiting, abdominal pain, diarrhea, constipation Musculoskeletal ROS: negative for - joint swelling or muscle pain Neurological  ROS: negative for - confusion, syncope Dermatological ROS: negative for pruritus and rash Psychiatric: negative for anxiety, depression, difficulty sleeping and memory loss  MEDICATIONS: Current Outpatient Medications  Medication Sig Dispense Refill  . lisinopriL-hydrochlorothiazide (ZESTORETIC) 20-25 mg tablet Take by mouth Take 1 tablet by mouth daily  . loratadine (CLARITIN) 10 mg tablet Take by mouth Take 10 mg by mouth daily.  Marland Kitchen omeprazole (PRILOSEC) 40 MG DR capsule Take by mouth Take 1 capsule (40 mg total) by mouth daily   No current facility-administered medications for this visit.   ALLERGIES: Patient has no known allergies.  PAST MEDICAL HISTORY: Past Medical History:  Diagnosis Date  . GERD (gastroesophageal reflux disease)  . History of cancer Aug 2020  Breast cancer, also had a skin cancer removed a year ago  . Hyperlipidemia Aug. 2020  Noted at my annual physical but no medication needed  . Hypertension   PAST SURGICAL HISTORY: Past Surgical History:  Procedure Laterality Date  . COLON SURGERY 2016  Colonoscopy-polyps removed  . COLONOSCOPY 2016    FAMILY HISTORY: Family History  Problem Relation Age of Onset  . Breast cancer Cheryl Mooney  . Colon cancer Cheryl Mooney  . High blood pressure (Hypertension) Cheryl Mooney  Cheryl Mooney did not have high blood pressure  . High blood pressure (Hypertension) Cheryl Mooney  . Prostate cancer Cheryl Mooney  . Alzheimer's disease Cheryl Mooney  . Breast cancer Cheryl Mooney  . Pancreatic cancer Cheryl Mooney    SOCIAL HISTORY: Social History   Socioeconomic History  . Marital status: Married  Spouse name: Not on file  . Number of children: Not on file  . Years of education: Not on file  . Highest education level: Not  on file  Occupational History  . Not on file  Social Needs  . Financial resource strain: Not on file  . Food insecurity  Worry: Not on file  Inability: Not on file  . Transportation needs  Medical: Not on file   Non-medical: Not on file  Tobacco Use  . Smoking status: Light Tobacco Smoker  Packs/day: 0.00  Years: 25.00  Pack years: 0.00  Types: Cigarettes  Start date: 05/07/1993  Last attempt to quit: 12/08/2018  Years since quitting: 0.0  . Smokeless tobacco: Never Used  . Tobacco comment: Intermittent smoker, inevening at home and always < 1/2 pack per day . Since Aug. only approx . 3 cig  Substance and Sexual Activity  . Alcohol use: Yes  Comment: Average 4-5 glasses of wine per week  . Drug use: Never  . Sexual activity: Yes  Partners: Male  Birth control/protection: Post-menopausal  Other Topics Concern  . Not on file  Social History Narrative  . Not on file   PHYSICAL EXAM: Vitals:  01/07/19 0859  BP: 135/82  Pulse: 77   Body mass index is 26.29 kg/m. Weight: 71.7 kg (158 lb)   GENERAL: Alert, active, oriented x3  HEENT: Pupils equal reactive to light. Extraocular movements are intact. Sclera clear. Palpebral conjunctiva normal red color.Pharynx clear.  NECK: Supple with no palpable mass and no adenopathy.  LUNGS: Sound clear with no rales rhonchi or wheezes.  HEART: Regular rhythm S1 and S2 without murmur.  BREAST: left breast normal without mass, skin or nipple changes or axillary nodes. There is a abnormal mass palpable right breast, lower outer quadrant. There is a clearing bruise in the right lower outer area of the breast. There is no axillary adenopathy palpated.  ABDOMEN: Soft and depressible, nontender with no palpable mass, no hepatomegaly.  EXTREMITIES: Well-developed well-nourished symmetrical with no dependent edema.  NEUROLOGICAL: Awake alert oriented, facial expression symmetrical, moving all extremities.  REVIEW OF DATA: I have reviewed the following data today: No visits with results within 3 Month(s) from this visit.  Latest known visit with results is:  No results found for any previous visit.  CBC and CMP done at [facility] on 01/05/2019 with  normal hemoglobin, platelets, electrolytes and liver and renal function.   ASSESSMENT: [name] is a 53 y.o. female presenting for consultation for bilateral mastectomy due to right breast cancer with BRCA 2 gene positive  Patient was oriented again about the pathology and genetic testing results. Surgical alternatives were discussed with patient including partial vs total mastectomy. Since she was found with BRCA 2 gene positive, I agree with recommendations of proceeding with bilateral mastectomy with sentinel lymph node biopsy. Surgical technique and post operative care was discussed with patient. Risk of surgery was discussed with patient including but not limited to: wound infection, seroma, hematoma, brachial plexopathy, mondor's disease (thrombosis of small veins of breast), chronic wound pain, breast lymphedema, altered sensation to the nipple and cosmesis among others. Patient already evaluated by Plastic and Reconstructive Surgery service for immediate reconstruction.   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 Bilateral mastectomy with bilateral sentinal lymph node biopsy with immediate reconstruction (56433 x2, 38525 x2) 2. CBC, CMP done on 01/05/2019 3. Avoid taking aspirin 5 days before surgery 4. Contact us if has any question or concern before surgery.   Patient verbalized understanding, all questions were answered, and were agreeable with the plan outlined above.   This was  a 40 minute encounter most of the time counseling the patient and coordinating plan of care.  Herbert Pun, MD  Electronically signed by Herbert Pun, MD

## 2019-01-12 NOTE — H&P (View-Only) (Signed)
PATIENT PROFILE: Cheryl Mooney is a 53 y.o. 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  PCP: Aida Puffer  HISTORY OF PRESENT ILLNESS: Ms. Dyckman was seen before after having her yearly mammogram and she felt a mass on the right breast. She denies any breast pain. There is no pain radiation. There is no alleviating or aggravating factor. She denies any skin changes. She denies nipple discharge. Denies any changes or symptoms since last visit. She had genetic testing and was found to be BRCA positive. She now comes for evaluation for bilateral mastectomy.   Patient usually has her yearly mammograms without any problems.  Family history of breast cancer: Mother and grandmother. Mother was a BRCA2 positive. Family history of other cancers: Mother with colon cancer with brain metastases Menarche: 75 to 53 years old Menopause: In her 1s Used OCP: In her 85s Used estrogen and progesterone therapy: Denies History of Radiation to the chest: None Number pregnancies: 0 No previous breast biopsy.   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, right (CMS-HCC) 01/07/2019  Malignant neoplasm of lower-outer quadrant of right female breast (CMS-HCC) 01/07/2019    GENERAL REVIEW OF SYSTEMS:   General ROS: negative for - chills, fatigue, fever, weight gain or weight loss Allergy and Immunology ROS: negative for - hives  Hematological and Lymphatic ROS: negative for - bleeding problems or bruising, negative for palpable nodes Endocrine ROS: negative for - heat or cold intolerance, hair changes Respiratory ROS: negative for - cough, shortness of breath or wheezing Cardiovascular ROS: no chest pain or palpitations GI ROS: negative for nausea, vomiting, abdominal pain, diarrhea, constipation Musculoskeletal ROS: negative for - joint swelling or muscle pain Neurological  ROS: negative for - confusion, syncope Dermatological ROS: negative for pruritus and rash Psychiatric: negative for anxiety, depression, difficulty sleeping and memory loss  MEDICATIONS: Current Outpatient Medications  Medication Sig Dispense Refill  . lisinopriL-hydrochlorothiazide (ZESTORETIC) 20-25 mg tablet Take by mouth Take 1 tablet by mouth daily  . loratadine (CLARITIN) 10 mg tablet Take by mouth Take 10 mg by mouth daily.  Marland Kitchen omeprazole (PRILOSEC) 40 MG DR capsule Take by mouth Take 1 capsule (40 mg total) by mouth daily   No current facility-administered medications for this visit.   ALLERGIES: Patient has no known allergies.  PAST MEDICAL HISTORY: Past Medical History:  Diagnosis Date  . GERD (gastroesophageal reflux disease)  . History of cancer Aug 2020  Breast cancer, also had a skin cancer removed a year ago  . Hyperlipidemia Aug. 2020  Noted at my annual physical but no medication needed  . Hypertension   PAST SURGICAL HISTORY: Past Surgical History:  Procedure Laterality Date  . COLON SURGERY 2016  Colonoscopy-polyps removed  . COLONOSCOPY 2016    FAMILY HISTORY: Family History  Problem Relation Age of Onset  . Breast cancer Mother  . Colon cancer Mother  . High blood pressure (Hypertension) Mother  Mother did not have high blood pressure  . High blood pressure (Hypertension) Father  . Prostate cancer Father  . Alzheimer's disease Father  . Breast cancer Maternal Grandmother  . Pancreatic cancer Maternal Grandfather    SOCIAL HISTORY: Social History   Socioeconomic History  . Marital status: Married  Spouse name: Not on file  . Number of children: Not on file  . Years of education: Not on file  . Highest education level: Not  on file  Occupational History  . Not on file  Social Needs  . Financial resource strain: Not on file  . Food insecurity  Worry: Not on file  Inability: Not on file  . Transportation needs  Medical: Not on file   Non-medical: Not on file  Tobacco Use  . Smoking status: Light Tobacco Smoker  Packs/day: 0.00  Years: 25.00  Pack years: 0.00  Types: Cigarettes  Start date: 05/07/1993  Last attempt to quit: 12/08/2018  Years since quitting: 0.0  . Smokeless tobacco: Never Used  . Tobacco comment: Intermittent smoker, inevening at home and always < 1/2 pack per day . Since Aug. only approx . 3 cig  Substance and Sexual Activity  . Alcohol use: Yes  Comment: Average 4-5 glasses of wine per week  . Drug use: Never  . Sexual activity: Yes  Partners: Male  Birth control/protection: Post-menopausal  Other Topics Concern  . Not on file  Social History Narrative  . Not on file   PHYSICAL EXAM: Vitals:  01/07/19 0859  BP: 135/82  Pulse: 77   Body mass index is 26.29 kg/m. Weight: 71.7 kg (158 lb)   GENERAL: Alert, active, oriented x3  HEENT: Pupils equal reactive to light. Extraocular movements are intact. Sclera clear. Palpebral conjunctiva normal red color.Pharynx clear.  NECK: Supple with no palpable mass and no adenopathy.  LUNGS: Sound clear with no rales rhonchi or wheezes.  HEART: Regular rhythm S1 and S2 without murmur.  BREAST: left breast normal without mass, skin or nipple changes or axillary nodes. There is a abnormal mass palpable right breast, lower outer quadrant. There is a clearing bruise in the right lower outer area of the breast. There is no axillary adenopathy palpated.  ABDOMEN: Soft and depressible, nontender with no palpable mass, no hepatomegaly.  EXTREMITIES: Well-developed well-nourished symmetrical with no dependent edema.  NEUROLOGICAL: Awake alert oriented, facial expression symmetrical, moving all extremities.  REVIEW OF DATA: I have reviewed the following data today: No visits with results within 3 Month(s) from this visit.  Latest known visit with results is:  No results found for any previous visit.  CBC and CMP done at [facility] on 01/05/2019 with  normal hemoglobin, platelets, electrolytes and liver and renal function.   ASSESSMENT: Ms. Ohlrich is a 53 y.o. female presenting for consultation for bilateral mastectomy due to right breast cancer with BRCA 2 gene positive  Patient was oriented again about the pathology and genetic testing results. Surgical alternatives were discussed with patient including partial vs total mastectomy. Since she was found with BRCA 2 gene positive, I agree with recommendations of proceeding with bilateral mastectomy with sentinel lymph node biopsy. Surgical technique and post operative care was discussed with patient. Risk of surgery was discussed with patient including but not limited to: wound infection, seroma, hematoma, brachial plexopathy, mondor's disease (thrombosis of small veins of breast), chronic wound pain, breast lymphedema, altered sensation to the nipple and cosmesis among others. Patient already evaluated by Plastic and Reconstructive Surgery service for immediate reconstruction.   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 Bilateral mastectomy with bilateral sentinal lymph node biopsy with immediate reconstruction (40973 x2, 38525 x2) 2. CBC, CMP done on 01/05/2019 3. Avoid taking aspirin 5 days before surgery 4. Contact us if has any question or concern before surgery.   Patient verbalized understanding, all questions were answered, and were agreeable with the plan outlined above.   This was  a 40 minute encounter most of the time counseling the patient and coordinating plan of care.  Herbert Pun, MD  Electronically signed by Herbert Pun, MD

## 2019-01-13 ENCOUNTER — Other Ambulatory Visit: Payer: Self-pay | Admitting: General Surgery

## 2019-01-13 DIAGNOSIS — C50911 Malignant neoplasm of unspecified site of right female breast: Secondary | ICD-10-CM

## 2019-01-13 MED ORDER — CEFAZOLIN SODIUM-DEXTROSE 2-4 GM/100ML-% IV SOLN
2.0000 g | INTRAVENOUS | Status: AC
  Administered 2019-01-14: 2 g via INTRAVENOUS

## 2019-01-14 ENCOUNTER — Ambulatory Visit: Payer: BC Managed Care – PPO | Admitting: Anesthesiology

## 2019-01-14 ENCOUNTER — Observation Stay
Admission: RE | Admit: 2019-01-14 | Discharge: 2019-01-15 | Disposition: A | Discharge status: Home / Self Care | Payer: BC Managed Care – PPO | Attending: General Surgery | Admitting: General Surgery

## 2019-01-14 ENCOUNTER — Ambulatory Visit
Admission: RE | Admit: 2019-01-14 | Discharge: 2019-01-14 | Disposition: A | Discharge status: Home / Self Care | Payer: BC Managed Care – PPO | Source: Ambulatory Visit | Attending: General Surgery | Admitting: General Surgery

## 2019-01-14 ENCOUNTER — Other Ambulatory Visit: Payer: Self-pay

## 2019-01-14 ENCOUNTER — Encounter: Payer: Self-pay | Admitting: *Deleted

## 2019-01-14 ENCOUNTER — Ambulatory Visit: Payer: BC Managed Care – PPO

## 2019-01-14 ENCOUNTER — Encounter
Admission: RE | Disposition: A | Discharge status: Home / Self Care | Payer: Self-pay | Source: Home / Self Care | Attending: General Surgery

## 2019-01-14 DIAGNOSIS — Z86010 Personal history of colonic polyps: Secondary | ICD-10-CM | POA: Insufficient documentation

## 2019-01-14 DIAGNOSIS — N6042 Mammary duct ectasia of left breast: Secondary | ICD-10-CM | POA: Insufficient documentation

## 2019-01-14 DIAGNOSIS — Z803 Family history of malignant neoplasm of breast: Secondary | ICD-10-CM | POA: Insufficient documentation

## 2019-01-14 DIAGNOSIS — Z1501 Genetic susceptibility to malignant neoplasm of breast: Secondary | ICD-10-CM | POA: Diagnosis not present

## 2019-01-14 DIAGNOSIS — C50911 Malignant neoplasm of unspecified site of right female breast: Secondary | ICD-10-CM | POA: Diagnosis not present

## 2019-01-14 DIAGNOSIS — D0511 Intraductal carcinoma in situ of right breast: Principal | ICD-10-CM | POA: Insufficient documentation

## 2019-01-14 DIAGNOSIS — Z87442 Personal history of urinary calculi: Secondary | ICD-10-CM | POA: Diagnosis not present

## 2019-01-14 DIAGNOSIS — Z79899 Other long term (current) drug therapy: Secondary | ICD-10-CM | POA: Diagnosis not present

## 2019-01-14 DIAGNOSIS — E785 Hyperlipidemia, unspecified: Secondary | ICD-10-CM | POA: Diagnosis not present

## 2019-01-14 DIAGNOSIS — R928 Other abnormal and inconclusive findings on diagnostic imaging of breast: Secondary | ICD-10-CM

## 2019-01-14 DIAGNOSIS — Z170 Estrogen receptor positive status [ER+]: Secondary | ICD-10-CM | POA: Insufficient documentation

## 2019-01-14 DIAGNOSIS — K219 Gastro-esophageal reflux disease without esophagitis: Secondary | ICD-10-CM | POA: Diagnosis not present

## 2019-01-14 DIAGNOSIS — Z800 Family history of malignant neoplasm of digestive organs: Secondary | ICD-10-CM | POA: Diagnosis not present

## 2019-01-14 DIAGNOSIS — Z801 Family history of malignant neoplasm of trachea, bronchus and lung: Secondary | ICD-10-CM | POA: Insufficient documentation

## 2019-01-14 DIAGNOSIS — I10 Essential (primary) hypertension: Secondary | ICD-10-CM | POA: Insufficient documentation

## 2019-01-14 DIAGNOSIS — Z85828 Personal history of other malignant neoplasm of skin: Secondary | ICD-10-CM | POA: Diagnosis not present

## 2019-01-14 DIAGNOSIS — F17210 Nicotine dependence, cigarettes, uncomplicated: Secondary | ICD-10-CM | POA: Insufficient documentation

## 2019-01-14 DIAGNOSIS — C50511 Malignant neoplasm of lower-outer quadrant of right female breast: Secondary | ICD-10-CM | POA: Diagnosis present

## 2019-01-14 DIAGNOSIS — C50919 Malignant neoplasm of unspecified site of unspecified female breast: Secondary | ICD-10-CM | POA: Diagnosis present

## 2019-01-14 HISTORY — PX: TOTAL MASTECTOMY: SHX6129

## 2019-01-14 HISTORY — PX: AXILLARY SENTINEL NODE BIOPSY: SHX5738

## 2019-01-14 HISTORY — PX: BREAST RECONSTRUCTION WITH PLACEMENT OF TISSUE EXPANDER AND ALLODERM: SHX6805

## 2019-01-14 HISTORY — PX: SIMPLE MASTECTOMY WITH AXILLARY SENTINEL NODE BIOPSY: SHX6098

## 2019-01-14 SURGERY — SIMPLE MASTECTOMY
Anesthesia: General | Site: Breast | Laterality: Right

## 2019-01-14 MED ORDER — BUPIVACAINE-EPINEPHRINE (PF) 0.5% -1:200000 IJ SOLN
INTRAMUSCULAR | Status: DC | PRN
  Administered 2019-01-14: 30 mL via PERINEURAL

## 2019-01-14 MED ORDER — FENTANYL CITRATE (PF) 100 MCG/2ML IJ SOLN
INTRAMUSCULAR | Status: DC | PRN
  Administered 2019-01-14 (×3): 50 ug via INTRAVENOUS
  Administered 2019-01-14: 25 ug via INTRAVENOUS
  Administered 2019-01-14: 50 ug via INTRAVENOUS

## 2019-01-14 MED ORDER — LISINOPRIL-HYDROCHLOROTHIAZIDE 20-25 MG PO TABS: 1.0000 | ORAL_TABLET | Freq: Every day | ORAL | Status: DC

## 2019-01-14 MED ORDER — ONDANSETRON HCL 4 MG/2ML IJ SOLN
INTRAMUSCULAR | Status: DC | PRN
  Administered 2019-01-14: 4 mg via INTRAVENOUS

## 2019-01-14 MED ORDER — FENTANYL CITRATE (PF) 100 MCG/2ML IJ SOLN
INTRAMUSCULAR | Status: AC
  Filled 2019-01-14: qty 2

## 2019-01-14 MED ORDER — CEFAZOLIN SODIUM-DEXTROSE 2-4 GM/100ML-% IV SOLN
INTRAVENOUS | Status: AC
  Filled 2019-01-14: qty 100

## 2019-01-14 MED ORDER — ACETAMINOPHEN 10 MG/ML IV SOLN
INTRAVENOUS | Status: AC
  Filled 2019-01-14: qty 100

## 2019-01-14 MED ORDER — SODIUM CHLORIDE 0.9 % IV SOLN
INTRAVENOUS | Status: DC
  Administered 2019-01-14: 20:00:00 via INTRAVENOUS

## 2019-01-14 MED ORDER — CELECOXIB 200 MG PO CAPS
200.0000 mg | ORAL_CAPSULE | Freq: Two times a day (BID) | ORAL | Status: DC
  Administered 2019-01-14 – 2019-01-15 (×2): 200 mg via ORAL
  Filled 2019-01-14 (×3): qty 1

## 2019-01-14 MED ORDER — DIAZEPAM 2 MG PO TABS
2.0000 mg | ORAL_TABLET | Freq: Two times a day (BID) | ORAL | Status: DC
  Administered 2019-01-14 – 2019-01-15 (×2): 2 mg via ORAL
  Filled 2019-01-14 (×2): qty 1

## 2019-01-14 MED ORDER — HYDROCODONE-ACETAMINOPHEN 5-325 MG PO TABS
1.0000 | ORAL_TABLET | ORAL | Status: DC | PRN
  Administered 2019-01-14 – 2019-01-15 (×2): 2 via ORAL
  Filled 2019-01-14 (×2): qty 2

## 2019-01-14 MED ORDER — GENTAMICIN SULFATE 40 MG/ML IJ SOLN
INTRAMUSCULAR | Status: AC
  Filled 2019-01-14: qty 2

## 2019-01-14 MED ORDER — ONDANSETRON HCL 4 MG/2ML IJ SOLN
4.0000 mg | Freq: Once | INTRAMUSCULAR | Status: AC | PRN
  Administered 2019-01-14: 16:00:00 4 mg via INTRAVENOUS

## 2019-01-14 MED ORDER — ROCURONIUM BROMIDE 100 MG/10ML IV SOLN
INTRAVENOUS | Status: DC | PRN
  Administered 2019-01-14: 20 mg via INTRAVENOUS
  Administered 2019-01-14: 10 mg via INTRAVENOUS
  Administered 2019-01-14: 50 mg via INTRAVENOUS
  Administered 2019-01-14: 20 mg via INTRAVENOUS

## 2019-01-14 MED ORDER — 0.9 % SODIUM CHLORIDE (POUR BTL) OPTIME
TOPICAL | Status: DC | PRN
  Administered 2019-01-14: 1000 mL

## 2019-01-14 MED ORDER — LIDOCAINE HCL (CARDIAC) PF 100 MG/5ML IV SOSY
PREFILLED_SYRINGE | INTRAVENOUS | Status: DC | PRN
  Administered 2019-01-14: 100 mg via INTRAVENOUS

## 2019-01-14 MED ORDER — ROCURONIUM BROMIDE 50 MG/5ML IV SOLN
INTRAVENOUS | Status: AC
  Filled 2019-01-14: qty 3

## 2019-01-14 MED ORDER — LACTATED RINGERS IV SOLN
INTRAVENOUS | Status: DC
  Administered 2019-01-14 (×2): via INTRAVENOUS

## 2019-01-14 MED ORDER — FENTANYL CITRATE (PF) 100 MCG/2ML IJ SOLN
25.0000 ug | INTRAMUSCULAR | Status: DC | PRN
  Administered 2019-01-14 (×2): 25 ug via INTRAVENOUS
  Administered 2019-01-14 (×2): 50 ug via INTRAVENOUS

## 2019-01-14 MED ORDER — PROMETHAZINE HCL 25 MG/ML IJ SOLN
INTRAMUSCULAR | Status: AC
  Administered 2019-01-14: 17:00:00 6.25 mg via INTRAVENOUS
  Filled 2019-01-14: qty 1

## 2019-01-14 MED ORDER — DEXAMETHASONE SODIUM PHOSPHATE 10 MG/ML IJ SOLN
INTRAMUSCULAR | Status: DC | PRN
  Administered 2019-01-14: 4 mg via INTRAVENOUS

## 2019-01-14 MED ORDER — LISINOPRIL 20 MG PO TABS
20.0000 mg | ORAL_TABLET | Freq: Every day | ORAL | Status: DC
  Administered 2019-01-14: 20:00:00 20 mg via ORAL
  Filled 2019-01-14 (×2): qty 1

## 2019-01-14 MED ORDER — FENTANYL CITRATE (PF) 100 MCG/2ML IJ SOLN
INTRAMUSCULAR | Status: AC
  Administered 2019-01-14: 16:00:00 50 ug via INTRAVENOUS
  Filled 2019-01-14: qty 2

## 2019-01-14 MED ORDER — PROMETHAZINE HCL 25 MG/ML IJ SOLN
6.2500 mg | Freq: Once | INTRAMUSCULAR | Status: AC
  Administered 2019-01-14: 17:00:00 6.25 mg via INTRAVENOUS

## 2019-01-14 MED ORDER — SODIUM CHLORIDE 0.9 % IV SOLN
INTRAVENOUS | Status: DC | PRN
  Administered 2019-01-14: 500 mL

## 2019-01-14 MED ORDER — GABAPENTIN 300 MG PO CAPS
300.0000 mg | ORAL_CAPSULE | Freq: Two times a day (BID) | ORAL | Status: DC
  Administered 2019-01-14 – 2019-01-15 (×2): 300 mg via ORAL
  Filled 2019-01-14 (×2): qty 1

## 2019-01-14 MED ORDER — MIDAZOLAM HCL 2 MG/2ML IJ SOLN
INTRAMUSCULAR | Status: DC | PRN
  Administered 2019-01-14: 2 mg via INTRAVENOUS

## 2019-01-14 MED ORDER — LORATADINE 10 MG PO TABS
10.0000 mg | ORAL_TABLET | Freq: Every day | ORAL | Status: DC
  Administered 2019-01-14 – 2019-01-15 (×2): 10 mg via ORAL
  Filled 2019-01-14 (×2): qty 1

## 2019-01-14 MED ORDER — SODIUM CHLORIDE FLUSH 0.9 % IV SOLN
INTRAVENOUS | Status: AC
  Administered 2019-01-14: 10 mL
  Filled 2019-01-14: qty 10

## 2019-01-14 MED ORDER — ONDANSETRON 4 MG PO TBDP: 4.0000 mg | ORAL_TABLET | Freq: Four times a day (QID) | ORAL | Status: DC | PRN

## 2019-01-14 MED ORDER — ACETAMINOPHEN 10 MG/ML IV SOLN
INTRAVENOUS | Status: DC | PRN
  Administered 2019-01-14: 1000 mg via INTRAVENOUS

## 2019-01-14 MED ORDER — LIDOCAINE HCL (PF) 2 % IJ SOLN
INTRAMUSCULAR | Status: AC
  Filled 2019-01-14: qty 10

## 2019-01-14 MED ORDER — TECHNETIUM TC 99M SULFUR COLLOID FILTERED
0.9720 | Freq: Once | INTRAVENOUS | Status: AC | PRN
  Administered 2019-01-14: 0.972 via INTRADERMAL

## 2019-01-14 MED ORDER — ONDANSETRON HCL 4 MG/2ML IJ SOLN
INTRAMUSCULAR | Status: AC
  Filled 2019-01-14: qty 2

## 2019-01-14 MED ORDER — SODIUM CHLORIDE 0.9 % IV SOLN
INTRAVENOUS | Status: DC | PRN
  Administered 2019-01-14: 13:00:00 1 mL

## 2019-01-14 MED ORDER — HYDROCHLOROTHIAZIDE 25 MG PO TABS
25.0000 mg | ORAL_TABLET | Freq: Every day | ORAL | Status: DC
  Administered 2019-01-14: 25 mg via ORAL
  Filled 2019-01-14 (×2): qty 1

## 2019-01-14 MED ORDER — ENOXAPARIN SODIUM 40 MG/0.4ML ~~LOC~~ SOLN
40.0000 mg | SUBCUTANEOUS | Status: DC
  Administered 2019-01-15: 40 mg via SUBCUTANEOUS
  Filled 2019-01-14: qty 0.4

## 2019-01-14 MED ORDER — ONDANSETRON HCL 4 MG/2ML IJ SOLN
4.0000 mg | Freq: Four times a day (QID) | INTRAMUSCULAR | Status: DC | PRN
  Administered 2019-01-15: 4 mg via INTRAVENOUS
  Filled 2019-01-14 (×3): qty 2

## 2019-01-14 MED ORDER — FENTANYL CITRATE (PF) 100 MCG/2ML IJ SOLN
INTRAMUSCULAR | Status: AC
  Administered 2019-01-14: 16:00:00 25 ug via INTRAVENOUS
  Filled 2019-01-14: qty 2

## 2019-01-14 MED ORDER — DEXAMETHASONE SODIUM PHOSPHATE 10 MG/ML IJ SOLN
INTRAMUSCULAR | Status: AC
  Filled 2019-01-14: qty 1

## 2019-01-14 MED ORDER — ONDANSETRON HCL 4 MG/2ML IJ SOLN
INTRAMUSCULAR | Status: AC
  Administered 2019-01-14: 16:00:00 4 mg via INTRAVENOUS
  Filled 2019-01-14: qty 2

## 2019-01-14 MED ORDER — MORPHINE SULFATE (PF) 4 MG/ML IV SOLN
4.0000 mg | INTRAVENOUS | Status: DC | PRN
  Administered 2019-01-14 – 2019-01-15 (×3): 4 mg via INTRAVENOUS
  Filled 2019-01-14 (×3): qty 1

## 2019-01-14 MED ORDER — TECHNETIUM TC 99M SULFUR COLLOID FILTERED
1.1600 | Freq: Once | INTRAVENOUS | Status: AC | PRN
  Administered 2019-01-14: 1.16 via INTRADERMAL

## 2019-01-14 MED ORDER — PANTOPRAZOLE SODIUM 40 MG PO TBEC
40.0000 mg | DELAYED_RELEASE_TABLET | Freq: Every day | ORAL | Status: DC
  Administered 2019-01-15: 40 mg via ORAL
  Filled 2019-01-14: qty 1

## 2019-01-14 MED ORDER — EPHEDRINE SULFATE 50 MG/ML IJ SOLN
INTRAMUSCULAR | Status: DC | PRN
  Administered 2019-01-14: 5 mg via INTRAVENOUS

## 2019-01-14 MED ORDER — PROPOFOL 10 MG/ML IV BOLUS
INTRAVENOUS | Status: DC | PRN
  Administered 2019-01-14: 200 mg via INTRAVENOUS

## 2019-01-14 SURGICAL SUPPLY — 80 items
APPLIER CLIP 11 MED OPEN (CLIP)
APPLIER CLIP 9.375 SM OPEN (CLIP)
BAG DECANTER FOR FLEXI CONT (MISCELLANEOUS) ×5 IMPLANT
BINDER ABDOMINAL 12 ML 46-62 (SOFTGOODS) ×1 IMPLANT
BINDER BREAST XLRG (GAUZE/BANDAGES/DRESSINGS) ×2 IMPLANT
BIOPATCH WHT 1IN DISK W/4.0 H (GAUZE/BANDAGES/DRESSINGS) ×10 IMPLANT
BLADE SURG 15 STRL LF DISP TIS (BLADE) ×8 IMPLANT
BLADE SURG 15 STRL SS (BLADE) ×2
BNDG GAUZE 4.5X4.1 6PLY STRL (MISCELLANEOUS) ×8 IMPLANT
BULB RESERV EVAC DRAIN JP 100C (MISCELLANEOUS) ×18 IMPLANT
CHLORAPREP W/TINT 26 (MISCELLANEOUS) ×14 IMPLANT
CLIP APPLIE 11 MED OPEN (CLIP) IMPLANT
CLIP APPLIE 9.375 SM OPEN (CLIP) IMPLANT
CNTNR SPEC 2.5X3XGRAD LEK (MISCELLANEOUS) ×8
CONT SPEC 4OZ STER OR WHT (MISCELLANEOUS) ×2
CONTAINER SPEC 2.5X3XGRAD LEK (MISCELLANEOUS) ×4 IMPLANT
COVER WAND RF STERILE (DRAPES) ×5 IMPLANT
DECANTER SPIKE VIAL GLASS SM (MISCELLANEOUS) ×2 IMPLANT
DERMABOND ADVANCED (GAUZE/BANDAGES/DRESSINGS) ×1
DERMABOND ADVANCED .7 DNX12 (GAUZE/BANDAGES/DRESSINGS) ×12 IMPLANT
DRAIN CHANNEL 19F RND (DRAIN) ×10 IMPLANT
DRAPE LAPAROTOMY 100X77 ABD (DRAPES) ×5 IMPLANT
DRAPE LAPAROTOMY 77X122 PED (DRAPES) ×4 IMPLANT
DRAPE LAPAROTOMY TRNSV 106X77 (MISCELLANEOUS) ×4 IMPLANT
ELECT CAUTERY BLADE 6.4 (BLADE) ×6 IMPLANT
ELECT REM PT RETURN 9FT ADLT (ELECTROSURGICAL) ×10
ELECTRODE REM PT RTRN 9FT ADLT (ELECTROSURGICAL) ×4 IMPLANT
GAUZE SPONGE 4X4 12PLY STRL (GAUZE/BANDAGES/DRESSINGS) ×4 IMPLANT
GLOVE BIO SURGEON STRL SZ 6.5 (GLOVE) ×26 IMPLANT
GLOVE BIOGEL PI IND STRL 6.5 (GLOVE) ×4 IMPLANT
GLOVE BIOGEL PI INDICATOR 6.5 (GLOVE)
GOWN STRL REUS W/ TWL LRG LVL3 (GOWN DISPOSABLE) ×24 IMPLANT
GOWN STRL REUS W/TWL LRG LVL3 (GOWN DISPOSABLE) ×5
GRAFT FLEX HD 6X16 PLIABLE (Tissue) ×2 IMPLANT
IMPL EXPANDER BREAST 535CC (Breast) IMPLANT
IMPLANT BREAST 535CC (Breast) ×2 IMPLANT
IMPLANT EXPANDER BREAST 535CC (Breast) ×8 IMPLANT
IV NS 1000ML (IV SOLUTION)
IV NS 1000ML BAXH (IV SOLUTION) IMPLANT
IV NS 500ML (IV SOLUTION) ×1
IV NS 500ML BAXH (IV SOLUTION) IMPLANT
KIT TURNOVER KIT A (KITS) ×5 IMPLANT
MARGIN MAP 10MM (MISCELLANEOUS) ×6 IMPLANT
NDL FILTER BLUNT 18X1 1/2 (NEEDLE) ×8 IMPLANT
NEEDLE FILTER BLUNT 18X 1/2SAF (NEEDLE) ×1
NEEDLE FILTER BLUNT 18X1 1/2 (NEEDLE) ×4 IMPLANT
NEEDLE HYPO 22GX1.5 SAFETY (NEEDLE) ×5 IMPLANT
PACK BASIN MAJOR ARMC (MISCELLANEOUS) ×10 IMPLANT
PACK UNIVERSAL (MISCELLANEOUS) IMPLANT
PDS 2.0 ×9 IMPLANT
PIN SAFETY STRL (MISCELLANEOUS) ×5 IMPLANT
RETRACTOR WOUND ALXS 18CM SML (MISCELLANEOUS) IMPLANT
RTRCTR WOUND ALEXIS O 18CM SML (MISCELLANEOUS)
SET ASEPTIC TRANSFER (MISCELLANEOUS) ×13 IMPLANT
SLEVE PROBE SENORX GAMMA FIND (MISCELLANEOUS) ×5 IMPLANT
SPONGE LAP 18X18 RF (DISPOSABLE) ×12 IMPLANT
SUT ETHILON 3-0 FS-10 30 BLK (SUTURE) ×5
SUT ETHILON 4-0 (SUTURE)
SUT ETHILON 4-0 FS2 18XMFL BLK (SUTURE)
SUT MNCRL 4-0 (SUTURE) ×4
SUT MNCRL 4-0 27XMFL (SUTURE) ×16
SUT MNCRL AB 3-0 PS2 27 (SUTURE) ×2 IMPLANT
SUT MNCRL+ 5-0 UNDYED PC-3 (SUTURE) ×12 IMPLANT
SUT MONOCRYL 5-0 (SUTURE) ×4
SUT PDS PLUS 2 (SUTURE) ×9
SUT PDS PLUS AB 2-0 CT-1 (SUTURE) ×24 IMPLANT
SUT SILK 2 0 SH (SUTURE) ×5 IMPLANT
SUT SILK 3-0 (SUTURE) ×5 IMPLANT
SUT SILK 4 0 SH (SUTURE) ×10 IMPLANT
SUT VIC AB 3-0 SH 27 (SUTURE) ×2
SUT VIC AB 3-0 SH 27X BRD (SUTURE) ×8 IMPLANT
SUT VIC AB 3-0 SH 8-18 (SUTURE) ×5 IMPLANT
SUT VICRYL+ 3-0 144IN (SUTURE) ×5 IMPLANT
SUTURE EHLN 3-0 FS-10 30 BLK (SUTURE) ×4 IMPLANT
SUTURE ETHLN 4-0 FS2 18XMF BLK (SUTURE) IMPLANT
SUTURE MNCRL 4-0 27XMF (SUTURE) ×16 IMPLANT
SYR 10ML LL (SYRINGE) ×8 IMPLANT
SYR BULB IRRIG 60ML STRL (SYRINGE) ×4 IMPLANT
TOWEL OR 17X26 4PK STRL BLUE (TOWEL DISPOSABLE) ×6 IMPLANT
WATER STERILE IRR 1000ML POUR (IV SOLUTION) ×5 IMPLANT

## 2019-01-14 NOTE — Transfer of Care (Signed)
Immediate Anesthesia Transfer of Care Note  Patient: Cheryl Mooney  Procedure(s) Performed: LEFT SIMPLE MASTECTOMY (Left Breast) RIGHT TOTAL MASTECTOMY (Right Breast) AXILLARY SENTINEL NODE BIOPSY (Bilateral Axilla) BILATERAL BREAST RECONSTRUCTION WITH PLACEMENT OF TISSUE EXPANDER AND FLEX HD (Bilateral Breast)  Patient Location: PACU  Anesthesia Type:General  Level of Consciousness: sedated  Airway & Oxygen Therapy: Patient Spontanous Breathing and Patient connected to face mask oxygen  Post-op Assessment: Report given to RN and Post -op Vital signs reviewed and stable  Post vital signs: Reviewed and stable  Last Vitals:  Vitals Value Taken Time  BP 159/99 01/14/19 1457  Temp 36.9 C 01/14/19 1457  Pulse 87 01/14/19 1500  Resp 15 01/14/19 1500  SpO2 99 % 01/14/19 1500  Vitals shown include unvalidated device data.  Last Pain:  Vitals:   01/14/19 1457  TempSrc:   PainSc: Asleep         Complications: No apparent anesthesia complications

## 2019-01-14 NOTE — Discharge Instructions (Signed)
INSTRUCTIONS FOR AFTER BREAST SURGERY   You are getting ready to undergo breast surgery.  You will likely have some questions about what to expect following your operation.  The following information will help you and your family understand what to expect when you are discharged from the hospital.  Following these guidelines will help ensure a smooth recovery and reduce risks of complications.   Postoperative instructions include information on: diet, wound care, medications and physical activity.  AFTER SURGERY Expect to go home after the procedure.  In some cases, you may need to spend one night in the hospital for observation.  DIET Breast surgery does not require a specific diet.  However, I have to mention that the healthier you eat the better your body can start healing. It is important to increasing your protein intake.  This means limiting the foods with sugar and carbohydrates.  Focus on vegetables and some meat.  If you have any liposuction during your procedure be sure to drink water.  If your urine is bright yellow, then it is concentrated, and you need to drink more water.  As a general rule after surgery, you should have 8 ounces of water every hour while awake.  If you find you are persistently nauseated or unable to take in liquids let us know.  NO TOBACCO USE or EXPOSURE.  This will slow your healing process and increase the risk of a wound.  WOUND CARE If you have a drain: You can shower five days after surgery.  Clean with baby wipes until the drain is removed.    If you have steri-strips / tape directly attached to your skin leave them in place. It is OK to get these wet.  No baths, pools or hot tubs for two weeks. We close your incision to leave the smallest and best-looking scar. No ointment or creams on your incisions until given the go ahead.  Especially not Neosporin (Too many skin reactions with this one).  A few weeks after surgery you can use Mederma and start massaging  the scar. We ask you to wear your binder or sports bra for the first 6 weeks around the clock, including while sleeping. This provides added comfort and helps reduce the fluid accumulation at the surgery site.  ACTIVITY No heavy lifting until cleared by the doctor.  This usually means no more than a half-gallon of milk.  It is OK to walk and climb stairs. In fact, moving your legs is very important to decrease your risk of a blood clot.  It will also help keep you from getting deconditioned.  Every 1 to 2 hours get up and walk for 5 minutes. This will help with a quicker recovery back to normal.  Let pain be your guide so you don't do too much.  NO, you cannot do the spring cleaning and don't plan on taking care of anyone else.  This is your time for TLC.  You will be more comfortable if you sleep and rest with your head elevated either with a few pillows under you or in a recliner.  No stomach sleeping for a few months.  WORK Everyone returns to work at different times. As a rough guide, most people take at least 1 - 2 weeks off prior to returning to work. If you need documentation for your job, bring the forms to your postoperative follow up visit.  DRIVING Arrange for someone to bring you home from the hospital.  You may be able  to drive a few days after surgery but not while taking any narcotics or valium.  BOWEL MOVEMENTS Constipation can occur after anesthesia and while taking pain medication.  It is important to stay ahead for your comfort.  We recommend taking Milk of Magnesia (2 tablespoons; twice a day) while taking the pain pills.  SEROMA This is fluid your body tried to put in the surgical site.  This is normal but if it creates tight skinny skin let us know.  It usually decreases in a few weeks.  WHEN TO CALL Call your surgeon's office if any of the following occur:  Fever 101 degrees F or greater  Excessive bleeding or fluid from the incision site.  Pain that increases over  time without aid from the medications  Redness, warmth, or pus draining from incision sites  Persistent nausea or inability to take in liquids  Severe misshapen area that underwent the operation.  Here are some resources:  1. Plastic surgery website: https://www.plasticsurgery.org/for-medical-professionals/education-and-resources/publications/breast-reconstruction-magazine 2. Breast Reconstruction Awareness Campaign:  HotelLives.co.nz 3. Plastic surgery Implant information:  https://www.plasticsurgery.org/patient-safety/breast-implant-safety   [facility]                      How to care for your drainage and suction unit at home Your drainage catheter will be connected to a Jackson-Pratt (JP) bulb that is a round and soft collection device.   The vacuum created when the device is compressed allows drainage to collect in the container.    Attach the JP bulb to your clothes with a safety pin to prevent tension on your skin and help prevent the tube from dislodging prematurely.    To compress the Jackson-Pratt Bulb:   Release the plug at the top of the bulb (emptying port)  Squeeze the bulb tightly in your fist, squeezing air out of the bulb  Replace the plug while the bulb is compressed  Be careful not to spill the contents when squeezing the bulb  *The bulb must be compressed to have suction and initiate drainage *  To empty the collection device:     Release the plug on the top of the collection unit (emptying port)  Pour the fluid into a measuring container such as a plastic medicine cup  Record the date and the amount of drainage emptied.  This should be done at routine times every day      Please bring this record with you to each office visit Date Amount of Drainage                                                            Contact your physician if any of the following occur:  Drain or incisional area  becomes red, swollen or hot to the touch.  You develop a fever greater than 100 degrees F.  Oozing at the skin insertion site - you may apply a gauze dressing.  Drain falls out - apply gauze dressing over the drain insertion site.  Drainage is increasing daily unrelated to activity pattern.  Note: You will usually have more drainage as your activity increases.

## 2019-01-14 NOTE — Op Note (Addendum)
Preoperative diagnosis: Breast cancer.  Postoperative diagnosis: Breast cancer.   Procedure: Bilateral total mastectomy.                      Bilateral sentinel lymph node biopsy  Anesthesia: GETA  Surgeon: Dr. Windell Moment  Assistant surgeon: Dr. Lysle Pearl  Wound Classification: Clean  Indications: Patient is a 53 y.o. female who had an abnormal mammogram that on workup with core needle biopsy was found to be invasive mammary carcinoma. 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 After discussion of alternatives, the patient elected total mastectomy with bilateral sentinel lymph node biopsy  Findings: 1. Adequate skin flaps with viable tissue.  2. Adequate hemostasis  Description of procedure: The patient was brought to the operating room and general anesthesia was induced. A time-out was completed verifying correct patient, procedure, site, positioning, and implant(s) and/or special equipment prior to beginning this procedure. Bilateral breasts, chest wall, axillas, and upper arm and neck were prepped and draped in the usual sterile fashion.  Initially the right breast done since it was positive for carcinoma. A skin incision was made that encompassed the nipple-areola complex and the previous biopsy scar and passed in an oblique direction across the breast. Flaps were raised in the avascular plane between subcutaneous tissue and breast tissue from the clavicle superiorly, the sternum medially, the anterior rectus sheath inferiorly, and past the lateral border of the pectoralis major muscle laterally. Hemostasis was achieved in the flaps. Next, the breast tissue and underlying pectoralis fascia were excised from the pectoralis major muscle, progressing from medially to laterally. At the lateral border of the pectoralis major muscle, the breast tissue was swung laterally and a lateral pedicle identified where breast tissue gave way to fat of axilla. The lateral pedicle was incised and the specimen  removed.  A hand-held gamma probe was used to identify the location of the hottest spot in the axilla. From the mastectomy incision the probe was placed and again, the point of maximal count was found. Dissection continue until nodule was identified. The probe was placed in contact with the node. The node was excised in its entirety. Ex vivo, the node measured 2145 counts when placed on the probe. The bed of the node measured 61 counts.  The left breast mastectomy was done using the similar technique as the right breast. The sentinel lymph done biopsy also done using the same technique. Ex vivo, the node measured 4005 counts when placed on the probe. The bed of the node measured 36 counts. The wound was irrigated and hemostasis was achieved.  The wounds were left open for immediate breast reconstruction by Plastic and Reconstructive surgery.   Specimen: 1. Right Breast                     2. Right Sentinel lymph node biopsy                     3. Left breast                     4. Left breast sentinel lymph node biopsy  Complications: None  Estimated Blood Loss: 25 mL

## 2019-01-14 NOTE — Plan of Care (Signed)

## 2019-01-14 NOTE — Anesthesia Procedure Notes (Signed)
Procedure Name: Intubation Date/Time: 01/14/2019 10:38 AM Performed by: Lowry Bowl, CRNA Pre-anesthesia Checklist: Patient identified, Emergency Drugs available, Suction available, Patient being monitored and Timeout performed Patient Re-evaluated:Patient Re-evaluated prior to induction Oxygen Delivery Method: Circle system utilized Preoxygenation: Pre-oxygenation with 100% oxygen Induction Type: IV induction, Cricoid Pressure applied and Rapid sequence Ventilation: Mask ventilation without difficulty Laryngoscope Size: Mac and 3 Grade View: Grade III Tube type: Oral Tube size: 7.0 mm Number of attempts: 1 Airway Equipment and Method: Stylet Placement Confirmation: ETT inserted through vocal cords under direct vision,  positive ETCO2 and breath sounds checked- equal and bilateral Secured at: 21 cm Tube secured with: Tape Dental Injury: Teeth and Oropharynx as per pre-operative assessment  Comments: Small mouth opening and anterior airway; passed ETT without complication.

## 2019-01-14 NOTE — Interval H&P Note (Signed)
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  The various methods of treatment have been discussed with the patient and family. After consideration of risks, benefits and other options for treatment, the patient has consented to  Procedure(s): SIMPLE MASTECTOMY (Left) TOTAL MASTECTOMY (Right) AXILLARY SENTINEL NODE BIOPSY (Bilateral) BREAST RECONSTRUCTION WITH PLACEMENT OF TISSUE EXPANDER AND Flex HD (Bilateral) as a surgical intervention.  The patient's history has been reviewed, patient examined, no change in status, stable for surgery.  I have reviewed the patient's chart and labs.  Questions were answered to the patient's satisfaction.     Herbert Pun

## 2019-01-14 NOTE — Progress Notes (Signed)
Pt arrived from PACU, sleepy. C/O pain 7/10. Pt received fentanyl in PACU per bedside report.  VSS.

## 2019-01-14 NOTE — Anesthesia Preprocedure Evaluation (Signed)
Anesthesia Evaluation  Patient identified by MRN, date of birth, ID band Patient awake    Reviewed: Allergy & Precautions, H&P , NPO status , Patient's Chart, lab work & pertinent test results, reviewed documented beta blocker date and time   Airway Mallampati: II  TM Distance: >3 FB Neck ROM: full    Dental  (+) Teeth Intact   Pulmonary neg pulmonary ROS, Current Smoker and Patient abstained from smoking.,    Pulmonary exam normal        Cardiovascular Exercise Tolerance: Good hypertension, On Medications negative cardio ROS Normal cardiovascular exam Rhythm:regular Rate:Normal     Neuro/Psych negative neurological ROS  negative psych ROS   GI/Hepatic Neg liver ROS, GERD  Medicated,  Endo/Other  negative endocrine ROS  Renal/GU negative Renal ROS  negative genitourinary   Musculoskeletal   Abdominal   Peds  Hematology negative hematology ROS (+)   Anesthesia Other Findings Past Medical History: No date: Benign neoplasm of sigmoid colon No date: Benign neoplasm of transverse colon No date: Breast cancer (HCC) No date: Family history of BRCA2 gene positive No date: Family history of breast cancer No date: Family history of colon cancer No date: Family history of lung cancer No date: Family history of pancreatic cancer No date: GERD (gastroesophageal reflux disease) No date: History of kidney stones No date: Hypertension 12/17/2018: Malignant neoplasm of lower-outer quadrant of right breast  of female, estrogen receptor positive ([place]) No date: Smoker Past Surgical History: 12/08/2018: BREAST BIOPSY; Right     Comment:  [place] bx of mass with calcs path pending 12/08/2018: BREAST BIOPSY; Right     Comment:  [place] bx of LN, path pending 02/17/2015: COLONOSCOPY WITH PROPOFOL; N/A     Comment:  Procedure: COLONOSCOPY WITH PROPOFOL;  Surgeon: Lucilla Lame, MD;  Location: [place];  Service:                Endoscopy;  Laterality: N/A; 02/17/2015: POLYPECTOMY     Comment:  Procedure: POLYPECTOMY;  Surgeon: Lucilla Lame, MD;                Location: [place];  Service: Endoscopy;; No date: WISDOM TOOTH EXTRACTION BMI    Body Mass Index: 26.46 kg/m     Reproductive/Obstetrics negative OB ROS                             Anesthesia Physical Anesthesia Plan  ASA: II  Anesthesia Plan: General ETT   Post-op Pain Management:    Induction:   PONV Risk Score and Plan: 3  Airway Management Planned:   Additional Equipment:   Intra-op Plan:   Post-operative Plan:   Informed Consent: I have reviewed the patients History and Physical, chart, labs and discussed the procedure including the risks, benefits and alternatives for the proposed anesthesia with the patient or authorized representative who has indicated his/her understanding and acceptance.     Dental Advisory Given  Plan Discussed with: CRNA  Anesthesia Plan Comments:         Anesthesia Quick Evaluation

## 2019-01-14 NOTE — Op Note (Signed)
Op report    DATE OF OPERATION:  01/14/2019  LOCATION: [facility]  SURGICAL DIVISION: Plastic Surgery  PREOPERATIVE DIAGNOSES:  1. Right Breast cancer.   2. BRCA positive.  POSTOPERATIVE DIAGNOSES:  1. Right Breast cancer.  2. BRCA positive.  PROCEDURE:  1. Bilateral immediate breast reconstruction with placement of Acellular Dermal Matrix and tissue expanders.  SURGEON: Rakeya Glab Sanger Zhana Jeangilles, DO  ANSSISTANT: Meghan  ANESTHESIA:  General.   COMPLICATIONS: None.   IMPLANTS: Left - Mentor 535 cc. Ref #[id number].  Serial Number [id number], 100 cc of injectable saline placed in the expander. Right - Mentor 535 cc. Ref #[id number].  Serial Number [id number], 100 cc of injectable saline placed in the expander. Acellular Dermal Matrix - Flex HD 6 x 16 cm  INDICATIONS FOR PROCEDURE:  The patient, Cheryl Mooney, is a 53 y.o. female born on 07-16-1965, is here for  immediate first stage breast reconstruction with placement of bilateral tissue expander and Acellular dermal matrix. MRN: [id number]  CONSENT:  Informed consent was obtained directly from the patient. Risks, benefits and alternatives were fully discussed. Specific risks including but not limited to bleeding, infection, hematoma, seroma, scarring, pain, implant infection, implant extrusion, capsular contracture, asymmetry, wound healing problems, and need for further surgery were all discussed. The patient did have an ample opportunity to have her questions answered to her satisfaction.   DESCRIPTION OF PROCEDURE:  The patient was taken to the operating room by the general surgery team. SCDs were placed and IV antibiotics were given. The patient's chest was prepped and draped in a sterile fashion. A time out was performed and the implants to be used were identified.  bilateral mastectomies were performed.  Once the general surgery team had completed their portion of the case the patient was rendered to the  plastic and reconstructive surgery team.  Left: The pectoralis major muscle was lifted from the chest wall with release of the lateral edge and lateral inframammary fold.  The pocket was irrigated with antibiotic solution and hemostasis was achieved with electrocautery.  The ADM was then prepared according to the manufacture guidelines and slits placed to help with postoperative fluid management.  The ADM was then sutured to the inferior and lateral edge of the inframammary fold with 2-0 PDS starting with an interrupted stitch and then a running stitch.  The lateral portion was sutured to with interrupted sutures after the expander was placed.  The expander was prepared according to the manufacture guidelines, the air evacuated and then it was placed under the ADM and pectoralis major muscle.  The inferior and lateral tabs were used to secure the expander to the chest wall with 2-0 PDS.  The drain was placed at the inframammary fold over the ADM and secured to the skin with 3-0 Silk.  The deep layers were closed with 3-0 Monocryl followed by 4-0 Monocryl.  The skin was closed with 5-0 Monocryl and then dermabond was applied.   Right:  The pectoralis major muscle was lifted from the chest wall with release of the lateral edge and lateral inframammary fold.  The pocket was irrigated with antibiotic solution and hemostasis was achieved with electrocautery.  The ADM was then prepared according to the manufacture guidelines and slits placed to help with postoperative fluid management.  The ADM was then sutured to the inferior and lateral edge of the inframammary fold with 2-0 PDS starting with an interrupted stitch and then a running stitch.  The lateral portion was sutured to with  interrupted sutures after the expander was placed.  The expander was prepared according to the manufacture guidelines, the air evacuated and then it was placed under the ADM and pectoralis major muscle.  The inferior and lateral tabs were  used to secure the expander to the chest wall with 2-0 PDS.  The drain was placed at the inframammary fold over the ADM and secured to the skin with 3-0 Silk.  The deep layers were closed with 3-0 Monocryl followed by 4-0 Monocryl.  The skin was closed with 5-0 Monocryl and then dermabond was applied.   The ABDs and breast binder were placed.  The patient tolerated the procedure well and there were no complications.  The patient was allowed to wake from anesthesia and taken to the recovery room in satisfactory condition.   The Registered Nurse First Assistant (RNFA) assisted throughout the case.  The RNFA was essential in retraction and counter traction when needed to make the case progress smoothly.  This retraction and assistance made it possible to see the tissue plans for the procedure.  The assistance was needed for blood control, tissue re-approximation and assisted with closure of the incision site.

## 2019-01-14 NOTE — Interval H&P Note (Signed)
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  The various methods of treatment have been discussed with the patient and family. After consideration of risks, benefits and other options for treatment, the patient has consented to  Procedure(s): SIMPLE MASTECTOMY (Left) TOTAL MASTECTOMY (Right) AXILLARY SENTINEL NODE BIOPSY (Bilateral) BREAST RECONSTRUCTION WITH PLACEMENT OF TISSUE EXPANDER AND Flex HD (Bilateral) as a surgical intervention.  The patient's history has been reviewed, patient examined, no change in status, stable for surgery.  I have reviewed the patient's chart and labs.  Questions were answered to the patient's satisfaction.     Cheryl Mooney

## 2019-01-14 NOTE — Anesthesia Post-op Follow-up Note (Signed)
Anesthesia QCDR form completed.        

## 2019-01-15 ENCOUNTER — Ambulatory Visit: Payer: BC Managed Care – PPO

## 2019-01-15 ENCOUNTER — Encounter: Payer: Self-pay | Admitting: General Surgery

## 2019-01-15 DIAGNOSIS — D0511 Intraductal carcinoma in situ of right breast: Secondary | ICD-10-CM | POA: Diagnosis not present

## 2019-01-15 MED ORDER — DIAZEPAM 2 MG PO TABS: 2.0000 mg | ORAL_TABLET | Freq: Three times a day (TID) | ORAL | 0 refills | Status: DC | PRN

## 2019-01-15 MED ORDER — ONDANSETRON HCL 4 MG PO TABS: 4.0000 mg | ORAL_TABLET | Freq: Every day | ORAL | 0 refills | Status: AC | PRN

## 2019-01-15 MED ORDER — HYDROCODONE-ACETAMINOPHEN 5-325 MG PO TABS: 1.0000 | ORAL_TABLET | ORAL | 0 refills | Status: AC | PRN

## 2019-01-15 MED ORDER — CEPHALEXIN 250 MG PO CAPS: 250.0000 mg | ORAL_CAPSULE | Freq: Four times a day (QID) | ORAL | 0 refills | Status: AC

## 2019-01-15 NOTE — Anesthesia Postprocedure Evaluation (Signed)
Anesthesia Post Note  Patient: Cheryl Mooney  Procedure(s) Performed: LEFT SIMPLE MASTECTOMY (Left Breast) RIGHT TOTAL MASTECTOMY (Right Breast) AXILLARY SENTINEL NODE BIOPSY (Bilateral Axilla) BILATERAL BREAST RECONSTRUCTION WITH PLACEMENT OF TISSUE EXPANDER AND FLEX HD (Bilateral Breast)  Patient location during evaluation: PACU Anesthesia Type: General Level of consciousness: awake and alert Pain management: pain level controlled Vital Signs Assessment: post-procedure vital signs reviewed and stable Respiratory status: spontaneous breathing, nonlabored ventilation, respiratory function stable and patient connected to nasal cannula oxygen Cardiovascular status: blood pressure returned to baseline and stable Postop Assessment: no apparent nausea or vomiting Anesthetic complications: no     Last Vitals:  Vitals:   01/15/19 0320 01/15/19 0846  BP: 127/65 99/78  Pulse: 63 (!) 57  Resp: 18 15  Temp: 36.4 C 36.4 C  SpO2: 98% 97%    Last Pain:  Vitals:   01/15/19 0846  TempSrc: Oral  PainSc:                  Martha Clan

## 2019-01-15 NOTE — Discharge Summary (Signed)
  Patient ID: Cheryl Mooney MRN: [id number] DOB/AGE: [birth year] 53 y.o.  Admit date: 01/14/2019 Discharge date: 01/15/2019   Discharge Diagnoses:  Active Problems:   Breast cancer ([place])   Procedures: Bilateral total mastectomy with bilateral sentinel node biopsy.                        Bilateral breast immediate reconstruction.  Hospital Course: Patient underwent bilateral mastectomy with bilateral sentinel node biopsy and immediate reconstruction.  She tolerated procedure well.  She was kept overnight for pain control.  Patient today doing well.  Pain controlled with oral pain medication.  Drainage adequate for postop day 1.  Patient is ambulating.  Patient tolerating diet.  Physical Exam  Constitutional: She is oriented to person, place, and time and well-developed, well-nourished, and in no distress.  Cardiovascular: Normal rate.  Pulmonary/Chest: Effort normal.  Abdominal: Soft.  Neurological: She is alert and oriented to person, place, and time.  Skin: Skin is warm.  Chest: The chest skin flaps look healthy with adequate capillary refill, no ecchymosis, no sign of skin ischemia.   Consults: None  Disposition: Discharge disposition: 01-Home or Self Care       Discharge Instructions    Diet - low sodium heart healthy   Complete by: As directed    Increase activity slowly   Complete by: As directed      Allergies as of 01/15/2019   No Known Allergies     Medication List    TAKE these medications   cetirizine 10 MG tablet Commonly known as: ZYRTEC Take 10 mg by mouth daily.   lisinopril-hydrochlorothiazide 20-25 MG tablet Commonly known as: ZESTORETIC Take 1 tablet by mouth daily.   omeprazole 40 MG capsule Commonly known as: PRILOSEC Take 1 capsule (40 mg total) by mouth daily.      Follow-up Information    Dillingham, Loel Lofty, DO In 1 week.   Specialty: Plastic Surgery Contact information: [address] [place]  [address] [phone]        Herbert Pun, MD Follow up in 2 week(s).   Specialty: General Surgery Contact information: [address] [place] [address] [phone]

## 2019-01-15 NOTE — Progress Notes (Signed)
Cheryl Mooney to be D/C'd home per MD order.  Discussed prescriptions and follow up appointments with the patient. Prescriptions given to patient, medication list explained in detail. Pt verbalized understanding.  Allergies as of 01/15/2019   No Known Allergies     Medication List    TAKE these medications   cetirizine 10 MG tablet Commonly known as: ZYRTEC Take 10 mg by mouth daily.   lisinopril-hydrochlorothiazide 20-25 MG tablet Commonly known as: ZESTORETIC Take 1 tablet by mouth daily.   omeprazole 40 MG capsule Commonly known as: PRILOSEC Take 1 capsule (40 mg total) by mouth daily.       Vitals:   01/15/19 0320 01/15/19 0846  BP: 127/65 99/78  Pulse: 63 (!) 57  Resp: 18 15  Temp: 97.6 F (36.4 C) 97.6 F (36.4 C)  SpO2: 98% 97%    Skin clean, dry and intact without evidence of skin break down, no evidence of skin tears noted. IV catheter discontinued intact. Site without signs and symptoms of complications. Dressing and pressure applied. Pt denies pain at this time. No complaints noted.  An After Visit Summary was printed and given to the patient. Patient escorted via [place], and D/C home via private auto.  Chuck Hint RN [facility] [address] [employer]

## 2019-01-18 LAB — SURGICAL PATHOLOGY

## 2019-01-20 ENCOUNTER — Other Ambulatory Visit: Payer: Self-pay

## 2019-01-20 NOTE — Progress Notes (Signed)
During pre screening patient stated that she had added extra strength tylenol to her medications after surgery. She stated that she was taking 3 at a time. Writer called patient back to advise her to check with her surgeon or pharmacist regarding correct dose for tylenol.

## 2019-01-21 ENCOUNTER — Telehealth: Payer: Self-pay

## 2019-01-21 ENCOUNTER — Inpatient Hospital Stay: Payer: BC Managed Care – PPO | Attending: Oncology | Admitting: Oncology

## 2019-01-21 ENCOUNTER — Encounter: Payer: Self-pay | Admitting: Oncology

## 2019-01-21 ENCOUNTER — Telehealth: Payer: Self-pay | Admitting: Surgical

## 2019-01-21 ENCOUNTER — Other Ambulatory Visit: Payer: Self-pay

## 2019-01-21 VITALS — BP 121/80 | HR 65 | Temp 96.0°F | Resp 16 | Wt 161.2 lb

## 2019-01-21 DIAGNOSIS — Z170 Estrogen receptor positive status [ER+]: Secondary | ICD-10-CM

## 2019-01-21 DIAGNOSIS — F17210 Nicotine dependence, cigarettes, uncomplicated: Secondary | ICD-10-CM | POA: Diagnosis not present

## 2019-01-21 DIAGNOSIS — Z79899 Other long term (current) drug therapy: Secondary | ICD-10-CM | POA: Insufficient documentation

## 2019-01-21 DIAGNOSIS — Z1502 Genetic susceptibility to malignant neoplasm of ovary: Secondary | ICD-10-CM | POA: Diagnosis not present

## 2019-01-21 DIAGNOSIS — K219 Gastro-esophageal reflux disease without esophagitis: Secondary | ICD-10-CM | POA: Insufficient documentation

## 2019-01-21 DIAGNOSIS — Z8249 Family history of ischemic heart disease and other diseases of the circulatory system: Secondary | ICD-10-CM | POA: Diagnosis not present

## 2019-01-21 DIAGNOSIS — Z1501 Genetic susceptibility to malignant neoplasm of breast: Secondary | ICD-10-CM

## 2019-01-21 DIAGNOSIS — C50511 Malignant neoplasm of lower-outer quadrant of right female breast: Secondary | ICD-10-CM | POA: Insufficient documentation

## 2019-01-21 DIAGNOSIS — Z1509 Genetic susceptibility to other malignant neoplasm: Secondary | ICD-10-CM

## 2019-01-21 DIAGNOSIS — Z803 Family history of malignant neoplasm of breast: Secondary | ICD-10-CM | POA: Diagnosis not present

## 2019-01-21 DIAGNOSIS — Z801 Family history of malignant neoplasm of trachea, bronchus and lung: Secondary | ICD-10-CM | POA: Diagnosis not present

## 2019-01-21 NOTE — Telephone Encounter (Signed)

## 2019-01-21 NOTE — Telephone Encounter (Signed)
Submitted oncotype testing via [facility] website.  Order AN:[id number]

## 2019-01-22 ENCOUNTER — Ambulatory Visit (INDEPENDENT_AMBULATORY_CARE_PROVIDER_SITE_OTHER): Payer: BC Managed Care – PPO | Admitting: Surgical

## 2019-01-22 ENCOUNTER — Encounter: Payer: Self-pay | Admitting: Surgical

## 2019-01-22 ENCOUNTER — Other Ambulatory Visit: Payer: Self-pay | Admitting: Oncology

## 2019-01-22 VITALS — BP 117/79 | HR 78 | Temp 97.7°F | Ht 65.0 in | Wt 161.6 lb

## 2019-01-22 DIAGNOSIS — Z1502 Genetic susceptibility to malignant neoplasm of ovary: Secondary | ICD-10-CM

## 2019-01-22 DIAGNOSIS — Z1509 Genetic susceptibility to other malignant neoplasm: Secondary | ICD-10-CM

## 2019-01-22 DIAGNOSIS — F17200 Nicotine dependence, unspecified, uncomplicated: Secondary | ICD-10-CM

## 2019-01-22 DIAGNOSIS — Z1501 Genetic susceptibility to malignant neoplasm of breast: Secondary | ICD-10-CM

## 2019-01-22 DIAGNOSIS — Z170 Estrogen receptor positive status [ER+]: Secondary | ICD-10-CM

## 2019-01-22 DIAGNOSIS — C50511 Malignant neoplasm of lower-outer quadrant of right female breast: Secondary | ICD-10-CM

## 2019-01-22 MED ORDER — DIAZEPAM 2 MG PO TABS: 2.0000 mg | ORAL_TABLET | Freq: Two times a day (BID) | ORAL | 0 refills | Status: DC | PRN

## 2019-01-22 NOTE — Progress Notes (Signed)
Hematology/Oncology Consult note [facility] Telephone:([phone] [phone]   Patient Care Team: Glean Hess, MD as PCP - General (Internal Medicine) Jannet Mantis, MD (Dermatology)  CHIEF COMPLAINTS/REASON FOR VISIT:  Follow-up for breast cancer.  HISTORY OF PRESENTING ILLNESS:  Cheryl Mooney is a  53 y.o.  female with PMH listed below who was referred to me for evaluation of breast cancer Patient reports feeling mass of her right breast week before her annual mammogram. 12/03/2018 patient had bilateral diagnostic mammogram and ultrasound  which showed suspicious 2.1 x 1.3 x 1.6 irregular mass at 7:00 in the right breast, 2 cm from the nipple. 2 borderline lymph nodes are seen in the right axilla.  1 of the nodes demonstrate a cortex of 4.4 mm.  Both lymph nodes demonstrated retention of fatty hila. Patient underwent ultrasound-guided biopsy of right breast mass as well as ultrasound-guided biopsy of 1 of the 2 mildly abnormal right axillary lymph nodes. 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 Right axilla lymph node negative for malignancy. ER> 90% positive, PR11-50% positive, HER-2 equivocal, 2+ by IHC.  FISH negative.   Nipple discharge: Denies Family history: Mother diagnosed with breast cancer at age of 43, and colon cancer at age of 79.  Mother has BRCA2 mutation.   Maternal grandmother breast cancer, maternal great aunt breast cancer.  Patient recalls that she was tested long time ago and was not aware of any results.  OCP use: In her 20s-30s Estrogen and progesterone therapy: denies History of radiation to chest: denies.  Previous breast surgery: Denies  BRCA 2 positive.   INTERVAL HISTORY Cheryl Mooney is a 53 y.o. female who has above history reviewed by me today presents for follow up visit for management of stage I breast cancer, ER PR  positive, HER-2 negative, hereditary BRCA 2 mutation.  Problems and complaints are listed below: #01/14/2019 patient underwent bilateral mastectomy with sentinel lymph node biopsy of left and the right axillary. Right breast showed invasive mammary carcinoma, DCIS positive, apocrine metaplastic, stromal fibrosis, duct ectasia, simple cyst formation, usual epithelial hyperplasia.  Sentinel lymph node on the right side was negative. pT2 pN0 Left prophylactic mastectomy and a sentinel lymph node negative for malignancy.  Patient reports to having pain around surgical site.  He has draining tubes.  Is has a follow-up appointment with surgeon. She had a lot of pain yesterday and ran out of pain medication.  Pain medication was refilled.  Today reports pain is manageable. Patient present to discuss pathology report. She has already had an GYN appointment to discuss about prophylactic oophorectomy in the future.  CA1 25 was checked previously and was within normal limits.  Review of Systems  Constitutional: Negative for appetite change, chills, fatigue and fever.  HENT:   Negative for hearing loss and voice change.   Eyes: Negative for eye problems.  Respiratory: Negative for chest tightness and cough.   Cardiovascular: Negative for chest pain.  Gastrointestinal: Negative for abdominal distention, abdominal pain and blood in stool.  Endocrine: Negative for hot flashes.  Genitourinary: Negative for difficulty urinating and frequency.   Musculoskeletal: Negative for arthralgias.  Skin: Negative for itching and rash.  Neurological: Negative for extremity weakness.  Hematological: Negative for adenopathy.  Psychiatric/Behavioral: Negative for confusion. The patient is nervous/anxious.   Pain at the mastectomy surgical site.  Taking pain medications.  MEDICAL HISTORY:  Past Medical History:  Diagnosis Date  Benign neoplasm of sigmoid colon    Benign neoplasm of transverse colon    Breast  cancer ([place])    Family history of BRCA2 gene positive    Family history of breast cancer    Family history of colon cancer    Family history of lung cancer    Family history of pancreatic cancer    GERD (gastroesophageal reflux disease)    History of kidney stones    Hypertension    Malignant neoplasm of lower-outer quadrant of right breast of female, estrogen receptor positive ([place]) 12/17/2018   Smoker     SURGICAL HISTORY: Past Surgical History:  Procedure Laterality Date   AXILLARY SENTINEL NODE BIOPSY Bilateral 01/14/2019   Procedure: AXILLARY SENTINEL NODE BIOPSY;  Surgeon: Herbert Pun, MD;  Location: [facility];  Service: General;  Laterality: Bilateral;   BREAST BIOPSY Right 12/08/2018   [place] bx of mass with calcs path pending   BREAST BIOPSY Right 12/08/2018   [place] bx of LN, path pending   BREAST RECONSTRUCTION WITH PLACEMENT OF TISSUE EXPANDER AND ALLODERM Bilateral 01/14/2019   Procedure: BILATERAL BREAST RECONSTRUCTION WITH PLACEMENT OF TISSUE EXPANDER AND FLEX HD;  Surgeon: Wallace Going, DO;  Location: [facility];  Service: Plastics;  Laterality: Bilateral;   COLONOSCOPY WITH PROPOFOL N/A 02/17/2015   Procedure: COLONOSCOPY WITH PROPOFOL;  Surgeon: Lucilla Lame, MD;  Location: [place];  Service: Endoscopy;  Laterality: N/A;   POLYPECTOMY  02/17/2015   Procedure: POLYPECTOMY;  Surgeon: Lucilla Lame, MD;  Location: [place];  Service: Endoscopy;;   SIMPLE MASTECTOMY WITH AXILLARY SENTINEL NODE BIOPSY Left 01/14/2019   Procedure: LEFT SIMPLE MASTECTOMY;  Surgeon: Herbert Pun, MD;  Location: [facility];  Service: General;  Laterality: Left;   TOTAL MASTECTOMY Right 01/14/2019   Procedure: RIGHT TOTAL MASTECTOMY;  Surgeon: Herbert Pun, MD;  Location: [facility];  Service: General;  Laterality: Right;   WISDOM TOOTH EXTRACTION      SOCIAL HISTORY: Social History   Socioeconomic History   Marital status: Married     Spouse name: Not on file   Number of children: Not on file   Years of education: Not on file   Highest education level: Not on file  Occupational History   Not on file  Social Needs   Financial resource strain: Not on file   Food insecurity    Worry: Not on file    Inability: Not on file   Transportation needs    Medical: Not on file    Non-medical: Not on file  Tobacco Use   Smoking status: Current Every Day Smoker    Packs/day: 0.25    Years: 20.00    Pack years: 5.00    Types: Cigarettes   Smokeless tobacco: Never Used   Tobacco comment: 3 cig. per day  Substance and Sexual Activity   Alcohol use: Yes    Alcohol/week: 2.0 standard drinks    Types: 2 Standard drinks or equivalent per week    Comment: occasional   Drug use: No   Sexual activity: Yes  Lifestyle   Physical activity    Days per week: Not on file    Minutes per session: Not on file   Stress: Not on file  Relationships   Social connections    Talks on phone: Not on file    Gets together: Not on file    Attends religious service: Not on file    Active member of club or organization: Not on file  Attends meetings of clubs or organizations: Not on file    Relationship status: Not on file   Intimate partner violence    Fear of current or ex partner: Not on file    Emotionally abused: Not on file    Physically abused: Not on file    Forced sexual activity: Not on file  Other Topics Concern   Not on file  Social History Narrative   Not on file  She is a retired Pharmacist, hospital  FAMILY HISTORY: Family History  Problem Relation Age of Onset   Colon cancer Mother 108   Breast cancer Mother 13       "BRCA2+"   Hypertension Father    Stroke Father    Alzheimer's disease Father    Prostate cancer Father    Breast cancer Maternal Aunt        mat great aunt   Breast cancer Maternal Grandmother 58   Pancreatic cancer Maternal Grandfather 26    ALLERGIES:  has No Known  Allergies.  MEDICATIONS:  Current Outpatient Medications  Medication Sig Dispense Refill   acetaminophen (TYLENOL) 500 MG tablet Take 500 mg by mouth every 6 (six) hours as needed.     cephALEXin (KEFLEX) 250 MG capsule Take 1 capsule (250 mg total) by mouth 4 (four) times daily for 10 days. 40 capsule 0   cetirizine (ZYRTEC) 10 MG tablet Take 10 mg by mouth daily.     HYDROcodone-acetaminophen (NORCO/VICODIN) 5-325 MG tablet Take 1-2 tablets by mouth every 4 (four) hours as needed for moderate pain.     lisinopril-hydrochlorothiazide (ZESTORETIC) 20-25 MG tablet Take 1 tablet by mouth daily. 90 tablet 3   omeprazole (PRILOSEC) 40 MG capsule Take 1 capsule (40 mg total) by mouth daily. 90 capsule 1   diazepam (VALIUM) 2 MG tablet Take 1 tablet (2 mg total) by mouth every 12 (twelve) hours as needed for muscle spasms. 20 tablet 0   ondansetron (ZOFRAN) 4 MG tablet Take 1 tablet (4 mg total) by mouth daily as needed for up to 10 days for nausea or vomiting. 10 tablet 0   No current facility-administered medications for this visit.      PHYSICAL EXAMINATION: ECOG PERFORMANCE STATUS: 0 - Asymptomatic Vitals:   01/21/19 1001  BP: 121/80  Pulse: 65  Resp: 16  Temp: (!) 96 F (35.6 C)   Filed Weights   01/21/19 1001  Weight: 161 lb 3.2 oz (73.1 kg)    Physical Exam Constitutional:      General: She is not in acute distress. HENT:     Head: Normocephalic and atraumatic.  Eyes:     General: No scleral icterus.    Pupils: Pupils are equal, round, and reactive to light.  Neck:     Musculoskeletal: Normal range of motion and neck supple.  Cardiovascular:     Rate and Rhythm: Normal rate and regular rhythm.     Heart sounds: Normal heart sounds.  Pulmonary:     Effort: Pulmonary effort is normal. No respiratory distress.     Breath sounds: No wheezing.  Abdominal:     General: Bowel sounds are normal. There is no distension.     Palpations: Abdomen is soft. There is no  mass.     Tenderness: There is no abdominal tenderness.  Musculoskeletal: Normal range of motion.        General: No deformity.  Skin:    General: Skin is warm and dry.     Findings: No erythema  or rash.  Neurological:     Mental Status: She is alert and oriented to person, place, and time.     Cranial Nerves: No cranial nerve deficit.     Coordination: Coordination normal.  Psychiatric:        Behavior: Behavior normal.        Thought Content: Thought content normal.    LABORATORY DATA:  I have reviewed the data as listed Lab Results  Component Value Date   WBC 9.1 01/05/2019   HGB 13.7 01/05/2019   HCT 40.4 01/05/2019   MCV 90.4 01/05/2019   PLT 315 01/05/2019   Recent Labs    11/23/18 1020 01/05/19 1514  NA 139 138  K 5.2 3.6  CL 97 103  CO2 25 25  GLUCOSE 112* 101*  BUN 9 9  CREATININE 0.79 0.63  CALCIUM 9.9 9.8  GFRNONAA 85 >60  GFRAA 97 >60  PROT 7.3 7.7  ALBUMIN 4.7 4.5  AST 20 18  ALT 15 15  ALKPHOS 94 95  BILITOT 0.2 0.4   Iron/TIBC/Ferritin/ %Sat No results found for: IRON, TIBC, FERRITIN, IRONPCTSAT   RADIOGRAPHIC STUDIES: I have personally reviewed the radiological images as listed and agreed with the findings in the report. Nm Sentinel Node Injection  Result Date: 01/14/2019 CLINICAL DATA:  Right breast cancer. EXAM: NUCLEAR MEDICINE BREAST LYMPHOSCINTIGRAPHY TECHNIQUE: Intradermal injection of radiopharmaceutical was performed at the 12 o'clock, 3 o'clock, 6 o'clock, and 9 o'clock positions around the right nipple. The patient was then sent to the operating room where the sentinel node(s) were identified and removed by the surgeon. RADIOPHARMACEUTICALS:  Total of 1 mCi Millipore-filtered Technetium-103msulfur colloid, injected in four aliquots of 0.25 mCi each. IMPRESSION: Uncomplicated intradermal injection of a total of 1 mCi Technetium-949mulfur colloid for purposes of sentinel node identification. Electronically Signed   By: MaInez Catalina.D.    On: 01/14/2019 09:11   Nm Sentinel Node Injection  Result Date: 01/14/2019 CLINICAL DATA:  Left breast cancer. EXAM: NUCLEAR MEDICINE BREAST LYMPHOSCINTIGRAPHY TECHNIQUE: Intradermal injection of radiopharmaceutical was performed at the 12 o'clock, 3 o'clock, 6 o'clock, and 9 o'clock positions around the left nipple. The patient was then sent to the operating room where the sentinel node(s) were identified and removed by the surgeon. RADIOPHARMACEUTICALS:  Total of 1 mCi Millipore-filtered Technetium-9942mlfur colloid, injected in four aliquots of 0.25 mCi each. IMPRESSION: Uncomplicated intradermal injection of a total of 1 mCi Technetium-[id number] colloid for purposes of sentinel node identification. Electronically Signed   By: MarkInez Catalina.   On: 01/14/2019 09:11   Us BK[place]ast Ltd Uni Left Inc Axilla  Result Date: 12/08/2018 CLINICAL DATA:  53 y5r old patient presents today for a biopsy of the right breast and of the right axilla. Since she was last seen here for her diagnostic mammogram and ultrasound on December 03, 2018, she has noticed a palpable lump in the superior left breast, 12-1 o'clock region. This area in the left breast was evaluated today after her biopsies were performed today. EXAM: DIGITAL DIAGNOSTIC LEFT MAMMOGRAM WITH CAD AND TOMO ULTRASOUND LEFT BREAST COMPARISON:  December 03, 2018 ACR Breast Density Category c: The breast tissue is heterogeneously dense, which may obscure small masses. FINDINGS: Metallic skin marker was placed in the region of patient concern in the superior left breast and a spot tangential view of the region of palpable concern was performed. In this region of the left breast, normal fatty breast parenchyma is imaged. There is a normal intramammary  lymph node deep to the marker. No suspicious mass, microcalcification, or distortion is seen. Mammographic images were processed with CAD. On physical exam, I palpate soft fat lobules in the far superior left breast at  the 1 o'clock region far superiorly, in the region of patient concern. This is approximately 8-9 cm from the nipple. I do not palpate a suspicious mass. Targeted ultrasound is performed, showing normal fatty breast parenchyma in the 1 o'clock region of the left breast 8-9 cm from the nipple in the region of patient concern. No suspicious findings on ultrasound. IMPRESSION: No evidence of malignancy in the region of patient concern in the superior left breast. RECOMMENDATION: Recommendation for the left breast is a mammogram in 1 year. Please note that the patient had right-sided breast biopsies performed today, dictated separately, for which recommendations will follow after the pathology is available. I have discussed the findings and recommendations with the patient. Results were also provided in writing at the conclusion of the visit. If applicable, a reminder letter will be sent to the patient regarding the next appointment. BI-RADS CATEGORY  1: Negative. Electronically Signed   By: Curlene Dolphin M.D.   On: 12/08/2018 14:24   US Breast Ltd Uni Right Inc Axilla  Result Date: 12/03/2018 CLINICAL DATA:  Newly palpable lump in the right breast. EXAM: DIGITAL DIAGNOSTIC BILATERAL MAMMOGRAM WITH CAD AND TOMO ULTRASOUND RIGHT BREAST COMPARISON:  Previous exam(s). ACR Breast Density Category c: The breast tissue is heterogeneously dense, which may obscure small masses. FINDINGS: There is a new irregular mass in the right breast measuring approximately 2 cm mammographically. There are calcifications in and anterior to the mass. The calcifications anterior to the mass span 11 mm. The mass and the calcifications taken together span 2.8 cm. Mammographic images were processed with CAD. On physical exam, there is a firm lump in the lateral right breast. Targeted ultrasound is performed, showing an irregular mass containing calcifications in the right breast at 7 o'clock, 2 cm from the nipple measuring 2.1 x 1.3 by 1.6 cm.  There are calcifications within the mass. Two borderline lymph nodes are seen in the right axilla. One of the nodes demonstrates a cortex of 4.4 mm. Both lymph nodes demonstrate retention of fatty hila. IMPRESSION: Suspicious mass with calcifications in the right breast as above. Two borderline to mildly abnormal lymph nodes in the right axilla. RECOMMENDATION: Recommend ultrasound-guided biopsy of the right breast mass. Recommend ultrasound-guided biopsy of 1 of the 2 mildly abnormal right axillary lymph nodes. I have discussed the findings and recommendations with the patient. Results were also provided in writing at the conclusion of the visit. If applicable, a reminder letter will be sent to the patient regarding the next appointment. BI-RADS CATEGORY  5: Highly suggestive of malignancy. Electronically Signed   By: Dorise Bullion III M.D   On: 12/03/2018 15:05   Mm Diag Breast Tomo Uni Left  Result Date: 12/08/2018 CLINICAL DATA:  53 year old patient presents today for a biopsy of the right breast and of the right axilla. Since she was last seen here for her diagnostic mammogram and ultrasound on December 03, 2018, she has noticed a palpable lump in the superior left breast, 12-1 o'clock region. This area in the left breast was evaluated today after her biopsies were performed today. EXAM: DIGITAL DIAGNOSTIC LEFT MAMMOGRAM WITH CAD AND TOMO ULTRASOUND LEFT BREAST COMPARISON:  December 03, 2018 ACR Breast Density Category c: The breast tissue is heterogeneously dense, which may obscure small masses.  FINDINGS: Metallic skin marker was placed in the region of patient concern in the superior left breast and a spot tangential view of the region of palpable concern was performed. In this region of the left breast, normal fatty breast parenchyma is imaged. There is a normal intramammary lymph node deep to the marker. No suspicious mass, microcalcification, or distortion is seen. Mammographic images were processed with  CAD. On physical exam, I palpate soft fat lobules in the far superior left breast at the 1 o'clock region far superiorly, in the region of patient concern. This is approximately 8-9 cm from the nipple. I do not palpate a suspicious mass. Targeted ultrasound is performed, showing normal fatty breast parenchyma in the 1 o'clock region of the left breast 8-9 cm from the nipple in the region of patient concern. No suspicious findings on ultrasound. IMPRESSION: No evidence of malignancy in the region of patient concern in the superior left breast. RECOMMENDATION: Recommendation for the left breast is a mammogram in 1 year. Please note that the patient had right-sided breast biopsies performed today, dictated separately, for which recommendations will follow after the pathology is available. I have discussed the findings and recommendations with the patient. Results were also provided in writing at the conclusion of the visit. If applicable, a reminder letter will be sent to the patient regarding the next appointment. BI-RADS CATEGORY  1: Negative. Electronically Signed   By: Curlene Dolphin M.D.   On: 12/08/2018 14:24   Mm Diag Breast Tomo Bilateral  Result Date: 12/03/2018 CLINICAL DATA:  Newly palpable lump in the right breast. EXAM: DIGITAL DIAGNOSTIC BILATERAL MAMMOGRAM WITH CAD AND TOMO ULTRASOUND RIGHT BREAST COMPARISON:  Previous exam(s). ACR Breast Density Category c: The breast tissue is heterogeneously dense, which may obscure small masses. FINDINGS: There is a new irregular mass in the right breast measuring approximately 2 cm mammographically. There are calcifications in and anterior to the mass. The calcifications anterior to the mass span 11 mm. The mass and the calcifications taken together span 2.8 cm. Mammographic images were processed with CAD. On physical exam, there is a firm lump in the lateral right breast. Targeted ultrasound is performed, showing an irregular mass containing calcifications in  the right breast at 7 o'clock, 2 cm from the nipple measuring 2.1 x 1.3 by 1.6 cm. There are calcifications within the mass. Two borderline lymph nodes are seen in the right axilla. One of the nodes demonstrates a cortex of 4.4 mm. Both lymph nodes demonstrate retention of fatty hila. IMPRESSION: Suspicious mass with calcifications in the right breast as above. Two borderline to mildly abnormal lymph nodes in the right axilla. RECOMMENDATION: Recommend ultrasound-guided biopsy of the right breast mass. Recommend ultrasound-guided biopsy of 1 of the 2 mildly abnormal right axillary lymph nodes. I have discussed the findings and recommendations with the patient. Results were also provided in writing at the conclusion of the visit. If applicable, a reminder letter will be sent to the patient regarding the next appointment. BI-RADS CATEGORY  5: Highly suggestive of malignancy. Electronically Signed   By: Dorise Bullion III M.D   On: 12/03/2018 15:05   Mm Clip Placement Right  Result Date: 12/08/2018 CLINICAL DATA:  Ultrasound-guided core needle biopsy was performed of a suspicious mass in the 7 o'clock position of the right breast and of a right axillary lymph node with mild cortical thickening. EXAM: DIAGNOSTIC RIGHT MAMMOGRAM POST ULTRASOUND BIOPSIES COMPARISON:  Previous exam(s). FINDINGS: Mammographic images were obtained following ultrasound guided biopsy  of a suspicious right breast mass at 7 o'clock position. A coil shaped biopsy clip is satisfactorily positioned within the suspicious mass. Again noted are calcifications within the mass. Similar-appearing calcifications extend anterior to the mass. HydroMARK biopsy clip is seen within the right axilla in the expected location of the biopsied lymph node. IMPRESSION: Satisfactory position of coil shaped biopsy clip within the suspicious right breast mass. HydroMARK biopsy clip confirmed in the right axilla, in the expected region of the axillary node biopsy.  Final Assessment: Post Procedure Mammograms for Marker Placement Electronically Signed   By: Curlene Dolphin M.D.   On: 12/08/2018 14:27   [place] Rt Breast Bx W Loc Dev 1st Lesion Img Bx Spec US Guide  Addendum Date: 12/11/2018   ADDENDUM REPORT: 12/09/2018 16:08 ADDENDUM: PATHOLOGY revealed: A. BREAST, RIGHT 7:00 2 CM FN; - INVASIVE MAMMARY CARCINOMA, NO SPECIAL TYPE. 9 mm in this sample. Grade 3. 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 Lymphovascular invasion: Present. B. LYMPH NODE, RIGHT AXILLA; ULTRASOUND-GUIDED BIOPSY: - LYMPH NODE, NEGATIVE FOR MALIGNANCY. Pathology results are CONCORDANT with imaging findings, per Dr. Curlene Dolphin. Pathology results were discussed with patient via telephone. The patient reported doing well after the biopsy with tenderness at the site. Post biopsy care instructions were reviewed and questions were answered. The patient was encouraged to call [facility] for any additional concerns. Recommendation: Surgical referral. Request for surgical referral was relayed to nurse navigators at [facility] by Electa Sniff RN on 12/09/2018. Addendum by Electa Sniff RN on 12/09/2018. Electronically Signed   By: Curlene Dolphin M.D.   On: 12/09/2018 16:08   Result Date: 12/11/2018 CLINICAL DATA:  Ultrasound-guided core needle biopsy was recommended of a suspicious mass in the 7 o'clock position of the right breast. EXAM: ULTRASOUND GUIDED RIGHT BREAST CORE NEEDLE BIOPSY COMPARISON:  Previous exam(s). FINDINGS: I met with the patient and we discussed the procedure of ultrasound-guided biopsy, including benefits and alternatives. We discussed the high likelihood of a successful procedure. We discussed the risks of the procedure, including infection, bleeding, tissue injury, clip migration, and inadequate sampling. Informed written consent was given. The usual time-out protocol was performed immediately prior to the procedure. Lesion quadrant: Lower outer  quadrant Using sterile technique and 1% Lidocaine as local anesthetic, under direct ultrasound visualization, a 12 gauge spring-loaded device was used to perform biopsy of an approximately 2.1 cm mass at 7 o'clock position right breast using a lateral approach. At the conclusion of the procedure a coil tissue marker clip was deployed into the biopsy cavity. Follow up 2 view mammogram was performed and dictated separately. IMPRESSION: Ultrasound guided biopsy of the right breast. No apparent complications. Electronically Signed: By: Curlene Dolphin M.D. On: 12/08/2018 17:03   [place] Rt Breast Bx W Loc Dev Ea Add Lesion Img Bx Spec US Guide  Addendum Date: 12/11/2018   ADDENDUM REPORT: 12/09/2018 16:06 ADDENDUM: PATHOLOGY revealed: A. BREAST, RIGHT 7:00 2 CM FN; - INVASIVE MAMMARY CARCINOMA, NO SPECIAL TYPE. 9 mm in this sample. Grade 3. 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 Lymphovascular invasion: Present. B. LYMPH NODE, RIGHT AXILLA; ULTRASOUND-GUIDED BIOPSY: - LYMPH NODE, NEGATIVE FOR MALIGNANCY. Pathology results are CONCORDANT with imaging findings, per Dr. Curlene Dolphin. Pathology results were discussed with patient via telephone. The patient reported doing well after the biopsy with tenderness at the site. Post biopsy care instructions were reviewed and questions were answered. The patient was encouraged to call [facility] for  any additional concerns. Recommendation: Surgical referral. Request for surgical referral was relayed to nurse navigators at [facility] by Electa Sniff RN on 12/09/2018. Addendum by Electa Sniff RN on 12/09/2018. Electronically Signed   By: Curlene Dolphin M.D.   On: 12/09/2018 16:06   Result Date: 12/11/2018 CLINICAL DATA:  Ultrasound-guided core needle biopsy was recommended of a right axillary lymph node. EXAM: [place] AXILLARY NODE CORE BIOPSY RIGHT COMPARISON:  Previous exam(s). FINDINGS: I met with the patient and we discussed the procedure of  ultrasound-guided biopsy, including benefits and alternatives. We discussed the high likelihood of a successful procedure. We discussed the risks of the procedure, including infection, bleeding, tissue injury, clip migration, and inadequate sampling. Informed written consent was given. The usual time-out protocol was performed immediately prior to the procedure. Using sterile technique and 1% Lidocaine as local anesthetic, under direct ultrasound visualization, a 14 gauge spring-loaded device was used to perform biopsy of A RIGHT AXILLARY LYMPH NODE WITH A CORTICAL THICKNESS OF APPROXIMATELY 4 MM using a lateral approach. At the conclusion of the procedure a HydroMARK tissue marker clip was deployed into the biopsy cavity. Follow up 2 view mammogram was performed and dictated separately. IMPRESSION: Ultrasound guided biopsy of the right axilla. No apparent complications. Electronically Signed: By: Curlene Dolphin M.D. On: 12/08/2018 17:02      ASSESSMENT & PLAN:  1. Malignant neoplasm of lower-outer quadrant of right breast of female, estrogen receptor positive ([place])   2. BRCA2 gene mutation positive in female    Pathology was reviewed and discussed with patient.  Stage I pT2 pN0 M0 ER + PR+ HER2 negative right breast cancer  Status post bilateral mastectomy and sentinel lymph node biopsy. Follow-up with surgery. Discussed with patient that I would send Oncotype DX to determine recurrence score and absolute chemotherapy benefit.  #BRCA gene mutation positive. Recommend patient to establish care with GYN for evaluation and prophylactic oophorectomy If patient does need adjuvant chemotherapy, she would need to finish chemo first before proceeding with prophylactic surgery.  We also discussed about future antiestrogen treatment with aromatase inhibitor. Also recommend annual dermatology examination. Screening for pancreatic cancer is controversial.  Will discuss in the future.   No orders of the  defined types were placed in this encounter.   All questions were answered. The patient knows to call the clinic with any problems questions or concerns.  Return of visit: 2 weeks We spent sufficient time to discuss many aspect of care, questions were answered to patient's satisfaction. Total face to face encounter time for this patient visit was 25 min. >50% of the time was  spent in counseling and coordination of care.      Earlie Server, MD, PhD Hematology Oncology [facility] at [facility] Pager- [phone] 01/22/2019

## 2019-01-22 NOTE — Progress Notes (Signed)
Subjective:     Patient ID: Cheryl Mooney, female    DOB: 12-15-1965, 53 y.o.   MRN: [id number]  Chief Complaint  Patient presents with  . Post-op Follow-up    (B) immediate breast reconstruction w/expander and flex HD     HPI: The patient is a 53 y.o. female here for follow-up after bilateral mastectomy followed by immediate breast reconstruction by Dr. Marla Mooney on 01/14/19. Here for 1 week post-op visit.  She reports that she is doing well overall. Pain adequately controlled on norco and valium. She denies any fever, chills, n/v. She has stopped smoking.  Her incisions are healing well. JP drain in place. L drain with < 20 cc output over multiple 24 hour periods. R drain with serosanguinous fluid, but higher output. 40 cc over last 24 hours noted and ~ 30 cc since this AM.   She is ready for a fill today. She is going to see Dr. Peyton Najjar Mooney (general surgeon) next week for follow up.  No complaints or concerns at this time.   Review of Systems  Constitutional: Negative for chills and fever.  Respiratory: Negative for cough, shortness of breath and wheezing.   Cardiovascular: Negative for chest pain, claudication and leg swelling.  Gastrointestinal: Negative for abdominal pain, diarrhea, nausea and vomiting.  Genitourinary: Negative.   Musculoskeletal: Positive for myalgias (breast).  Skin: Negative for itching and rash.  Neurological: Positive for sensory change (breasts). Negative for dizziness, focal weakness, weakness and headaches.     Objective:   Vital Signs BP 117/79 (BP Location: Left Arm, Patient Position: Sitting, Cuff Size: Normal)   Pulse 78   Temp 97.7 F (36.5 C) (Temporal)   Ht _0  (1.651 m)   Wt 161 lb 9.6 oz (73.3 kg)   SpO2 98%   BMI 26.89 kg/m  Vital Signs and Nursing Note Reviewed  Physical Exam  Constitutional: She is oriented to person, place, and time and well-developed, well-nourished, and in no distress.  HENT:  Head: Normocephalic  and atraumatic.  Cardiovascular: Normal rate.  Pulmonary/Chest: Effort normal. No respiratory distress.    JP drains in place bilaterally. Serosanguinous output (R > L). Incisions c/d/i. No drainage noted. No dehiscence. No surrounding erythema, warm.  Musculoskeletal: Normal range of motion.  Neurological: She is alert and oriented to person, place, and time. Gait normal.  Skin: Skin is warm and dry. No rash noted. She is not diaphoretic. No erythema. No pallor.  Psychiatric: Mood and affect normal.      Assessment/Plan:     ICD-10-CM   1. BRCA2 gene mutation positive in female  Z15.01    Z15.02    Z15.09   2. 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 Tobacco use disorder  F17.200     Cheryl Mooney is doing well, no sign of infection, hematoma. Incisions c/d/i. No dehiscence noted.  Left JP drain removed due to low output.  R JP drain left in place - will see if general surgery can remove drain at her visit next week. She reports she has been showering.   She is out of valium - will send prescription for her for muscle spasms and muscle relaxation after fills.  We placed injectable saline in the Expander using a sterile technique: Right: 50 cc for a total of 150 / 535 cc Left: 50 cc for a total of 150 / 535 cc  Cheryl Rhine Nahum Sherrer, PA-C 01/22/2019, 10:09 AM

## 2019-01-26 ENCOUNTER — Encounter: Payer: Self-pay | Admitting: Oncology

## 2019-01-26 NOTE — Telephone Encounter (Signed)
Form filled and submitted for ins p/a

## 2019-01-27 ENCOUNTER — Other Ambulatory Visit: Payer: Self-pay

## 2019-01-27 ENCOUNTER — Encounter: Payer: Self-pay | Admitting: Obstetrics and Gynecology

## 2019-01-27 ENCOUNTER — Ambulatory Visit (INDEPENDENT_AMBULATORY_CARE_PROVIDER_SITE_OTHER): Payer: BC Managed Care – PPO | Admitting: Obstetrics and Gynecology

## 2019-01-27 VITALS — BP 114/70 | Ht 65.0 in | Wt 159.0 lb

## 2019-01-27 DIAGNOSIS — Z1501 Genetic susceptibility to malignant neoplasm of breast: Secondary | ICD-10-CM | POA: Diagnosis not present

## 2019-01-27 DIAGNOSIS — Z1502 Genetic susceptibility to malignant neoplasm of ovary: Secondary | ICD-10-CM

## 2019-01-27 DIAGNOSIS — Z1211 Encounter for screening for malignant neoplasm of colon: Secondary | ICD-10-CM

## 2019-01-27 DIAGNOSIS — Z1509 Genetic susceptibility to other malignant neoplasm: Secondary | ICD-10-CM

## 2019-01-27 DIAGNOSIS — Z803 Family history of malignant neoplasm of breast: Secondary | ICD-10-CM | POA: Diagnosis not present

## 2019-01-27 NOTE — Progress Notes (Signed)
Patient ID: Cheryl Mooney, female   DOB: 1965-12-25, 53 y.o.   MRN: [id number]  Reason for Consult: Advice Only (BRCA positive, possible oophorectomy)   Referred by Earlie Server, MD  Subjective:     HPI:  Cheryl Mooney is a 53 y.o. female. She recently underwent a bilateral mastectomy for breast cancer and was found to be BRCA2 positive. She was referred today to discuss prophylactic salpingo-oophorectomy    Gynecological History Menarche: 12 Menopause: 38 Describes periods as regular monthly, heavy with 6-7 days of bleeding. Used OCPs for 15 years.  Last pap smear: 2019- NIL Last Mammogram: 12/03/2018- BIRADS 5 History of STDs: Remote hx of HPV as a teen Sexually Active: yes  Obstetrical History G0P0  Past Medical History:  Diagnosis Date  . Benign neoplasm of sigmoid colon   . Benign neoplasm of transverse colon   . Breast cancer ([place])   . Family history of BRCA2 gene positive   . Family history of breast cancer   . Family history of colon cancer   . Family history of lung cancer   . Family history of pancreatic cancer   . GERD (gastroesophageal reflux disease)   . History of kidney stones   . Hypertension   . Malignant neoplasm of lower-outer quadrant of right breast of female, estrogen receptor positive ([place]) 12/17/2018  . Smoker    Family History  Problem Relation Age of Onset  . Colon cancer Mother 7  . Breast cancer Mother 7       "BRCA2+"  . Hypertension Father   . Stroke Father   . Alzheimer's disease Father   . Prostate cancer Father   . Breast cancer Maternal Aunt        mat great aunt  . Breast cancer Maternal Grandmother 23  . Pancreatic cancer Maternal Grandfather 52   Past Surgical History:  Procedure Laterality Date  . AXILLARY SENTINEL NODE BIOPSY Bilateral 01/14/2019   Procedure: AXILLARY SENTINEL NODE BIOPSY;  Surgeon: Herbert Pun, MD;  Location: [facility];  Service: General;  Laterality: Bilateral;  . BREAST BIOPSY Right  12/08/2018   [place] bx of mass with calcs path pending  . BREAST BIOPSY Right 12/08/2018   [place] bx of LN, path pending  . BREAST RECONSTRUCTION WITH PLACEMENT OF TISSUE EXPANDER AND ALLODERM Bilateral 01/14/2019   Procedure: BILATERAL BREAST RECONSTRUCTION WITH PLACEMENT OF TISSUE EXPANDER AND FLEX HD;  Surgeon: Wallace Going, DO;  Location: [facility];  Service: Plastics;  Laterality: Bilateral;  . COLONOSCOPY WITH PROPOFOL N/A 02/17/2015   Procedure: COLONOSCOPY WITH PROPOFOL;  Surgeon: Lucilla Lame, MD;  Location: [place];  Service: Endoscopy;  Laterality: N/A;  . POLYPECTOMY  02/17/2015   Procedure: POLYPECTOMY;  Surgeon: Lucilla Lame, MD;  Location: [place];  Service: Endoscopy;;  . SIMPLE MASTECTOMY WITH AXILLARY SENTINEL NODE BIOPSY Left 01/14/2019   Procedure: LEFT SIMPLE MASTECTOMY;  Surgeon: Herbert Pun, MD;  Location: [facility];  Service: General;  Laterality: Left;  . TOTAL MASTECTOMY Right 01/14/2019   Procedure: RIGHT TOTAL MASTECTOMY;  Surgeon: Herbert Pun, MD;  Location: [facility];  Service: General;  Laterality: Right;  . WISDOM TOOTH EXTRACTION      Short Social History:  Social History   Tobacco Use  . Smoking status: Current Every Day Smoker    Packs/day: 0.25    Years: 20.00    Pack years: 5.00    Types: Cigarettes  . Smokeless tobacco: Never Used  . Tobacco comment: 3  cig. per day  Substance Use Topics  . Alcohol use: Yes    Alcohol/week: 2.0 standard drinks    Types: 2 Standard drinks or equivalent per week    Comment: occasional    No Known Allergies  Current Outpatient Medications  Medication Sig Dispense Refill  . acetaminophen (TYLENOL) 500 MG tablet Take 500 mg by mouth every 6 (six) hours as needed.    . cetirizine (ZYRTEC) 10 MG tablet Take 10 mg by mouth daily.    Marland Kitchen lisinopril-hydrochlorothiazide (ZESTORETIC) 20-25 MG tablet Take 1 tablet by mouth daily. 90 tablet 3  . omeprazole (PRILOSEC) 40 MG capsule Take  1 capsule (40 mg total) by mouth daily. 90 capsule 1  . diazepam (VALIUM) 2 MG tablet Take 1 tablet (2 mg total) by mouth every 12 (twelve) hours as needed for muscle spasms. (Patient not taking: Reported on 01/27/2019) 20 tablet 0  . HYDROcodone-acetaminophen (NORCO/VICODIN) 5-325 MG tablet Take 1-2 tablets by mouth every 4 (four) hours as needed for moderate pain.     No current facility-administered medications for this visit.     Review of Systems  Constitutional: Negative for chills, fatigue, fever and unexpected weight change.  HENT: Negative for trouble swallowing.  Eyes: Negative for loss of vision.  Respiratory: Negative for cough, shortness of breath and wheezing.  Cardiovascular: Negative for chest pain, leg swelling, palpitations and syncope.  GI: Negative for abdominal pain, blood in stool, diarrhea, nausea and vomiting.  GU: Negative for difficulty urinating, dysuria, frequency and hematuria.  Musculoskeletal: Negative for back pain, leg pain and joint pain.  Skin: Negative for rash.  Neurological: Negative for dizziness, headaches, light-headedness, numbness and seizures.  Psychiatric: Negative for behavioral problem, confusion, depressed mood and sleep disturbance.        Objective:  Objective   Vitals:   01/27/19 1317  BP: 114/70  Weight: 159 lb (72.1 kg)  Height: 5' 5"  (1.651 m)   Body mass index is 26.46 kg/m.  Physical Exam Vitals signs and nursing note reviewed.  Constitutional:      Appearance: She is well-developed.  HENT:     Head: Normocephalic and atraumatic.  Eyes:     Pupils: Pupils are equal, round, and reactive to light.  Cardiovascular:     Rate and Rhythm: Normal rate and regular rhythm.  Pulmonary:     Effort: Pulmonary effort is normal. No respiratory distress.  Genitourinary:    Comments: External: Normal appearing vulva. No lesions noted.  Speculum examination: Normal appearing cervix. No blood in the vaginal vault. no discharge.    Bimanual examination: Uterus midline, non-tender, normal in size, shape and contour.  No CMT. No adnexal masses. No adnexal tenderness. Pelvis not fixed.  Skin:    General: Skin is warm and dry.  Neurological:     Mental Status: She is alert and oriented to person, place, and time.  Psychiatric:        Behavior: Behavior normal.        Thought Content: Thought content normal.        Judgment: Judgment normal.      Assessment/Plan:     52 yo with BRCA 2 mutation here for consultation regarding risk reducing salping-oophorectomy.  Provided patient with information regarding BRCA 2 and risk reducing surgery. She would like to have a bilateral salpingo-oophorectomy. She understands that this will result in about a 80% reduction in risk for ovarian and peritoneal cancer. She understands to still monitor herself for symptoms of ovarian cancer.  Patient still is awaiting plan for chemotherapy. If she needs chemotherapy then surgery will be delayed until after.  She was concerned regarding cervical cancer. Reassured patient that she has had all normal pap smears since 2014. Next pap due 2021.  Referral for colonoscopy made- patient due for this in 2021.   More than 30 minutes were spent face to face with the patient in the room with more than 50% of the time spent providing counseling and discussing the plan of management.     Adrian Prows MD [facility], [facility] 01/27/2019 2:03 PM

## 2019-02-02 ENCOUNTER — Encounter: Payer: Self-pay | Admitting: Oncology

## 2019-02-03 NOTE — Telephone Encounter (Signed)
Results received, scanned in chart, and copy given to MD.

## 2019-02-04 ENCOUNTER — Telehealth: Payer: Self-pay

## 2019-02-04 NOTE — Telephone Encounter (Signed)

## 2019-02-05 ENCOUNTER — Telehealth (INDEPENDENT_AMBULATORY_CARE_PROVIDER_SITE_OTHER): Payer: Self-pay

## 2019-02-05 ENCOUNTER — Other Ambulatory Visit: Payer: Self-pay

## 2019-02-05 ENCOUNTER — Ambulatory Visit (INDEPENDENT_AMBULATORY_CARE_PROVIDER_SITE_OTHER): Payer: BC Managed Care – PPO | Admitting: Plastic Surgery

## 2019-02-05 ENCOUNTER — Encounter: Payer: Self-pay | Admitting: Oncology

## 2019-02-05 ENCOUNTER — Encounter: Payer: Self-pay | Admitting: Plastic Surgery

## 2019-02-05 ENCOUNTER — Inpatient Hospital Stay: Payer: BC Managed Care – PPO | Admitting: Oncology

## 2019-02-05 VITALS — BP 118/81 | HR 76 | Temp 97.7°F | Resp 18 | Wt 160.7 lb

## 2019-02-05 VITALS — BP 132/82 | HR 74 | Temp 97.5°F | Ht 65.0 in | Wt 160.2 lb

## 2019-02-05 DIAGNOSIS — Z809 Family history of malignant neoplasm, unspecified: Secondary | ICD-10-CM

## 2019-02-05 DIAGNOSIS — Z1501 Genetic susceptibility to malignant neoplasm of breast: Secondary | ICD-10-CM

## 2019-02-05 DIAGNOSIS — Z170 Estrogen receptor positive status [ER+]: Secondary | ICD-10-CM

## 2019-02-05 DIAGNOSIS — C50511 Malignant neoplasm of lower-outer quadrant of right female breast: Secondary | ICD-10-CM

## 2019-02-05 DIAGNOSIS — Z1509 Genetic susceptibility to other malignant neoplasm: Secondary | ICD-10-CM

## 2019-02-05 DIAGNOSIS — Z1502 Genetic susceptibility to malignant neoplasm of ovary: Secondary | ICD-10-CM

## 2019-02-05 MED ORDER — DIAZEPAM 2 MG PO TABS: 2.0000 mg | ORAL_TABLET | Freq: Two times a day (BID) | ORAL | 0 refills | Status: DC | PRN

## 2019-02-05 MED ORDER — HYDROCODONE-ACETAMINOPHEN 5-325 MG PO TABS: 1.0000 | ORAL_TABLET | Freq: Four times a day (QID) | ORAL | 0 refills | Status: AC | PRN

## 2019-02-05 NOTE — Telephone Encounter (Signed)
I attempted to contact the patient regarding getting a port placement. A message was left for return call.

## 2019-02-05 NOTE — Progress Notes (Signed)
Patient here for follow up. No new breast issues.  

## 2019-02-05 NOTE — Progress Notes (Addendum)
   Subjective:    Patient ID: Cheryl Mooney, female    DOB: 27-Apr-1965, 53 y.o.   MRN: [id number]  The patient is a 53 year old female here for follow-up after undergoing bilateral breast reconstruction.  She had mastectomies with expanders placed on 10/8.  Flaps look great.  Incision healing well.  No sign of infection.  She described a sensitivity of the upper chest.  No sign of rash.  She states it has gotten much better.     Review of Systems  Constitutional: Negative.   HENT: Negative.   Eyes: Negative.   Respiratory: Negative.   Cardiovascular: Negative.   Gastrointestinal: Negative.   Genitourinary: Negative.   Musculoskeletal: Negative.   Skin: Negative.   Hematological: Negative.   Psychiatric/Behavioral: Negative.        Objective:   Physical Exam Vitals signs and nursing note reviewed.  Constitutional:      Appearance: Normal appearance.  HENT:     Head: Normocephalic.  Cardiovascular:     Rate and Rhythm: Normal rate.     Pulses: Normal pulses.  Pulmonary:     Effort: Pulmonary effort is normal.  Abdominal:     General: Abdomen is flat.  Neurological:     General: No focal deficit present.     Mental Status: She is alert and oriented to person, place, and time.  Psychiatric:        Mood and Affect: Mood normal.        Behavior: Behavior normal.        Thought Content: Thought content normal.        Assessment & Plan:     ICD-10-CM   1. 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

## 2019-02-07 MED ORDER — DEXAMETHASONE 4 MG PO TABS: ORAL_TABLET | ORAL | 1 refills | Status: DC

## 2019-02-07 MED ORDER — LIDOCAINE-PRILOCAINE 2.5-2.5 % EX CREA: TOPICAL_CREAM | CUTANEOUS | 3 refills | Status: DC

## 2019-02-07 MED ORDER — PROCHLORPERAZINE MALEATE 10 MG PO TABS: 10.0000 mg | ORAL_TABLET | Freq: Four times a day (QID) | ORAL | 1 refills | Status: DC | PRN

## 2019-02-07 NOTE — Progress Notes (Signed)
START ON PATHWAY REGIMEN - Breast   Dose-Dense AC q14 days:   A cycle is every 14 days:     Doxorubicin      Cyclophosphamide      [id number]-xxxx   **Always confirm dose/schedule in your pharmacy ordering system**  Paclitaxel 80 mg/m2 Weekly:   Administer weekly:     Paclitaxel   **Always confirm dose/schedule in your pharmacy ordering system**  Patient Characteristics: Postoperative without Neoadjuvant Therapy (Pathologic Staging), Invasive Disease, Adjuvant Therapy, HER2 Negative/Unknown/Equivocal, ER Positive, Node Negative, pT1a-c, pN0/N1mi or pT2 or Higher, pN0, Oncotype High Risk (? 26) Therapeutic Status: Postoperative without Neoadjuvant Therapy (Pathologic Staging) AJCC Grade: G3 AJCC N Category: pN0 AJCC M Category: cM0 ER Status: Positive (+) AJCC 8 Stage Grouping: IB HER2 Status: Negative (-) Oncotype Dx Recurrence Score: 31 AJCC T Category: pT2 PR Status: Positive (+) Has this patient completed genomic testing<= Yes - Oncotype DX(R) Intent of Therapy: Curative Intent, Discussed with Patient 

## 2019-02-07 NOTE — Progress Notes (Signed)
Hematology/Oncology Consult note [facility] Telephone:([phone] [phone]   Patient Care Team: Glean Hess, MD as PCP - General (Internal Medicine) Jannet Mantis, MD (Dermatology)  CHIEF COMPLAINTS/REASON FOR VISIT:  Follow-up for breast cancer.  HISTORY OF PRESENTING ILLNESS:  Cheryl Mooney is a  53 y.o.  female with PMH listed below who was referred to me for evaluation of breast cancer Patient reports feeling mass of her right breast week before her annual mammogram. 12/03/2018 patient had bilateral diagnostic mammogram and ultrasound  which showed suspicious 2.1 x 1.3 x 1.6 irregular mass at 7:00 in the right breast, 2 cm from the nipple. 2 borderline lymph nodes are seen in the right axilla.  1 of the nodes demonstrate a cortex of 4.4 mm.  Both lymph nodes demonstrated retention of fatty hila. Patient underwent ultrasound-guided biopsy of right breast mass as well as ultrasound-guided biopsy of 1 of the 2 mildly abnormal right axillary lymph nodes. 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 Right axilla lymph node negative for malignancy. ER> 90% positive, PR11-50% positive, HER-2 equivocal, 2+ by IHC.  FISH negative.   Nipple discharge: Denies Family history: Mother diagnosed with breast cancer at age of 72, and colon cancer at age of 78.  Mother has BRCA2 mutation.   Maternal grandmother breast cancer, maternal great aunt breast cancer.  Patient recalls that she was tested long time ago and was not aware of any results.  OCP use: In her 20s-30s Estrogen and progesterone therapy: denies History of radiation to chest: denies.  Previous breast surgery: Denies  BRCA 2 positive.  # 01/14/2019 patient underwent bilateral mastectomy with sentinel lymph node biopsy of left and the right axillary. Right breast showed invasive mammary carcinoma, DCIS positive,  apocrine metaplastic, stromal fibrosis, duct ectasia, simple cyst formation, usual epithelial hyperplasia.  Sentinel lymph node on the right side was negative. pT2 pN0 Left prophylactic mastectomy and a sentinel lymph node negative for malignancy.   INTERVAL HISTORY Cheryl Mooney is a 53 y.o. female who has above history reviewed by me today presents for follow up visit for management of stage I breast cancer, ER PR positive, HER-2 negative, hereditary BRCA 2 mutation.  Problems and complaints are listed below: #Patient discussed MammaPrint score and adjuvant chemotherapy. No concerns mastectomy sites.  Denies any pain today..  Review of Systems  Constitutional: Negative for appetite change, chills, fatigue and fever.  HENT:   Negative for hearing loss and voice change.   Eyes: Negative for eye problems.  Respiratory: Negative for chest tightness and cough.   Cardiovascular: Negative for chest pain.  Gastrointestinal: Negative for abdominal distention, abdominal pain and blood in stool.  Endocrine: Negative for hot flashes.  Genitourinary: Negative for difficulty urinating and frequency.   Musculoskeletal: Negative for arthralgias.  Skin: Negative for itching and rash.  Neurological: Negative for extremity weakness.  Hematological: Negative for adenopathy.  Psychiatric/Behavioral: Negative for confusion. The patient is nervous/anxious.     MEDICAL HISTORY:  Past Medical History:  Diagnosis Date   Benign neoplasm of sigmoid colon    Benign neoplasm of transverse colon    Breast cancer ([place])    Family history of BRCA2 gene positive    Family history of breast cancer    Family history of colon cancer    Family history of lung cancer    Family history of pancreatic cancer    GERD (gastroesophageal reflux disease)  History of kidney stones    Hypertension    Malignant neoplasm of lower-outer quadrant of right breast of female, estrogen receptor positive ([place])  12/17/2018   Smoker     SURGICAL HISTORY: Past Surgical History:  Procedure Laterality Date   AXILLARY SENTINEL NODE BIOPSY Bilateral 01/14/2019   Procedure: AXILLARY SENTINEL NODE BIOPSY;  Surgeon: Herbert Pun, MD;  Location: [facility];  Service: General;  Laterality: Bilateral;   BREAST BIOPSY Right 12/08/2018   [place] bx of mass with calcs path pending   BREAST BIOPSY Right 12/08/2018   [place] bx of LN, path pending   BREAST RECONSTRUCTION WITH PLACEMENT OF TISSUE EXPANDER AND ALLODERM Bilateral 01/14/2019   Procedure: BILATERAL BREAST RECONSTRUCTION WITH PLACEMENT OF TISSUE EXPANDER AND FLEX HD;  Surgeon: Wallace Going, DO;  Location: [facility];  Service: Plastics;  Laterality: Bilateral;   COLONOSCOPY WITH PROPOFOL N/A 02/17/2015   Procedure: COLONOSCOPY WITH PROPOFOL;  Surgeon: Lucilla Lame, MD;  Location: [place];  Service: Endoscopy;  Laterality: N/A;   POLYPECTOMY  02/17/2015   Procedure: POLYPECTOMY;  Surgeon: Lucilla Lame, MD;  Location: [place];  Service: Endoscopy;;   SIMPLE MASTECTOMY WITH AXILLARY SENTINEL NODE BIOPSY Left 01/14/2019   Procedure: LEFT SIMPLE MASTECTOMY;  Surgeon: Herbert Pun, MD;  Location: [facility];  Service: General;  Laterality: Left;   TOTAL MASTECTOMY Right 01/14/2019   Procedure: RIGHT TOTAL MASTECTOMY;  Surgeon: Herbert Pun, MD;  Location: [facility];  Service: General;  Laterality: Right;   WISDOM TOOTH EXTRACTION      SOCIAL HISTORY: Social History   Socioeconomic History   Marital status: Married    Spouse name: Not on file   Number of children: Not on file   Years of education: Not on file   Highest education level: Not on file  Occupational History   Not on file  Social Needs   Financial resource strain: Not on file   Food insecurity    Worry: Not on file    Inability: Not on file   Transportation needs    Medical: Not on file    Non-medical: Not on file  Tobacco Use     Smoking status: Current Every Day Smoker    Packs/day: 0.25    Years: 20.00    Pack years: 5.00    Types: Cigarettes   Smokeless tobacco: Never Used   Tobacco comment: 3 cig. per day  Substance and Sexual Activity   Alcohol use: Yes    Alcohol/week: 2.0 standard drinks    Types: 2 Standard drinks or equivalent per week    Comment: occasional   Drug use: No   Sexual activity: Yes    Birth control/protection: Post-menopausal  Lifestyle   Physical activity    Days per week: Not on file    Minutes per session: Not on file   Stress: Not on file  Relationships   Social connections    Talks on phone: Not on file    Gets together: Not on file    Attends religious service: Not on file    Active member of club or organization: Not on file    Attends meetings of clubs or organizations: Not on file    Relationship status: Not on file   Intimate partner violence    Fear of current or ex partner: Not on file    Emotionally abused: Not on file    Physically abused: Not on file    Forced sexual activity: Not on file  Other Topics Concern   Not on file  Social History Narrative   Not on file  She is a retired Pharmacist, hospital  FAMILY HISTORY: Family History  Problem Relation Age of Onset   Colon cancer Mother 25   Breast cancer Mother 71       "BRCA2+"   Hypertension Father    Stroke Father    Alzheimer's disease Father    Prostate cancer Father    Breast cancer Maternal Aunt        mat great aunt   Breast cancer Maternal Grandmother 58   Pancreatic cancer Maternal Grandfather 67    ALLERGIES:  has No Known Allergies.  MEDICATIONS:  Current Outpatient Medications  Medication Sig Dispense Refill   acetaminophen (TYLENOL) 500 MG tablet Take 500 mg by mouth every 6 (six) hours as needed.     cetirizine (ZYRTEC) 10 MG tablet Take 10 mg by mouth daily.     diazepam (VALIUM) 2 MG tablet Take 1 tablet (2 mg total) by mouth every 12 (twelve) hours as needed for  muscle spasms. 20 tablet 0   HYDROcodone-acetaminophen (NORCO) 5-325 MG tablet Take 1 tablet by mouth every 6 (six) hours as needed for up to 5 days for severe pain. 20 tablet 0   lisinopril-hydrochlorothiazide (ZESTORETIC) 20-25 MG tablet Take 1 tablet by mouth daily. 90 tablet 3   omeprazole (PRILOSEC) 40 MG capsule Take 1 capsule (40 mg total) by mouth daily. 90 capsule 1   diazepam (VALIUM) 2 MG tablet Take 1 tablet (2 mg total) by mouth every 12 (twelve) hours as needed for muscle spasms. (Patient not taking: Reported on 02/05/2019) 30 tablet 0   HYDROcodone-acetaminophen (NORCO/VICODIN) 5-325 MG tablet Take 1-2 tablets by mouth every 4 (four) hours as needed for moderate pain.     No current facility-administered medications for this visit.      PHYSICAL EXAMINATION: ECOG PERFORMANCE STATUS: 0 - Asymptomatic Vitals:   02/05/19 1324  BP: 118/81  Pulse: 76  Resp: 18  Temp: 97.7 F (36.5 C)   Filed Weights   02/05/19 1324  Weight: 160 lb 11.2 oz (72.9 kg)    Physical Exam Constitutional:      General: She is not in acute distress. HENT:     Head: Normocephalic and atraumatic.  Eyes:     General: No scleral icterus.    Pupils: Pupils are equal, round, and reactive to light.  Neck:     Musculoskeletal: Normal range of motion and neck supple.  Cardiovascular:     Rate and Rhythm: Normal rate and regular rhythm.     Heart sounds: Normal heart sounds.  Pulmonary:     Effort: Pulmonary effort is normal. No respiratory distress.     Breath sounds: No wheezing.  Abdominal:     General: Bowel sounds are normal. There is no distension.     Palpations: Abdomen is soft. There is no mass.     Tenderness: There is no abdominal tenderness.  Musculoskeletal: Normal range of motion.        General: No deformity.  Skin:    General: Skin is warm and dry.     Findings: No erythema or rash.  Neurological:     Mental Status: She is alert and oriented to person, place, and time.       Cranial Nerves: No cranial nerve deficit.     Coordination: Coordination normal.  Psychiatric:        Behavior: Behavior normal.  Thought Content: Thought content normal.    LABORATORY DATA:  I have reviewed the data as listed Lab Results  Component Value Date   WBC 9.1 01/05/2019   HGB 13.7 01/05/2019   HCT 40.4 01/05/2019   MCV 90.4 01/05/2019   PLT 315 01/05/2019   Recent Labs    11/23/18 1020 01/05/19 1514  NA 139 138  K 5.2 3.6  CL 97 103  CO2 25 25  GLUCOSE 112* 101*  BUN 9 9  CREATININE 0.79 0.63  CALCIUM 9.9 9.8  GFRNONAA 85 >60  GFRAA 97 >60  PROT 7.3 7.7  ALBUMIN 4.7 4.5  AST 20 18  ALT 15 15  ALKPHOS 94 95  BILITOT 0.2 0.4   Iron/TIBC/Ferritin/ %Sat No results found for: IRON, TIBC, FERRITIN, IRONPCTSAT   RADIOGRAPHIC STUDIES: I have personally reviewed the radiological images as listed and agreed with the findings in the report. Nm Sentinel Node Injection  Result Date: 01/14/2019 CLINICAL DATA:  Right breast cancer. EXAM: NUCLEAR MEDICINE BREAST LYMPHOSCINTIGRAPHY TECHNIQUE: Intradermal injection of radiopharmaceutical was performed at the 12 o'clock, 3 o'clock, 6 o'clock, and 9 o'clock positions around the right nipple. The patient was then sent to the operating room where the sentinel node(s) were identified and removed by the surgeon. RADIOPHARMACEUTICALS:  Total of 1 mCi Millipore-filtered Technetium-95msulfur colloid, injected in four aliquots of 0.25 mCi each. IMPRESSION: Uncomplicated intradermal injection of a total of 1 mCi Technetium-970mulfur colloid for purposes of sentinel node identification. Electronically Signed   By: MaInez Catalina.D.   On: 01/14/2019 09:11   Nm Sentinel Node Injection  Result Date: 01/14/2019 CLINICAL DATA:  Left breast cancer. EXAM: NUCLEAR MEDICINE BREAST LYMPHOSCINTIGRAPHY TECHNIQUE: Intradermal injection of radiopharmaceutical was performed at the 12 o'clock, 3 o'clock, 6 o'clock, and 9 o'clock positions  around the left nipple. The patient was then sent to the operating room where the sentinel node(s) were identified and removed by the surgeon. RADIOPHARMACEUTICALS:  Total of 1 mCi Millipore-filtered Technetium-9969mlfur colloid, injected in four aliquots of 0.25 mCi each. IMPRESSION: Uncomplicated intradermal injection of a total of 1 mCi Technetium-[id number] colloid for purposes of sentinel node identification. Electronically Signed   By: MarkInez Catalina.   On: 01/14/2019 09:11   Us BK[place]ast Ltd Uni Left Inc Axilla  Result Date: 12/08/2018 CLINICAL DATA:  53 y66r old patient presents today for a biopsy of the right breast and of the right axilla. Since she was last seen here for her diagnostic mammogram and ultrasound on December 03, 2018, she has noticed a palpable lump in the superior left breast, 12-1 o'clock region. This area in the left breast was evaluated today after her biopsies were performed today. EXAM: DIGITAL DIAGNOSTIC LEFT MAMMOGRAM WITH CAD AND TOMO ULTRASOUND LEFT BREAST COMPARISON:  December 03, 2018 ACR Breast Density Category c: The breast tissue is heterogeneously dense, which may obscure small masses. FINDINGS: Metallic skin marker was placed in the region of patient concern in the superior left breast and a spot tangential view of the region of palpable concern was performed. In this region of the left breast, normal fatty breast parenchyma is imaged. There is a normal intramammary lymph node deep to the marker. No suspicious mass, microcalcification, or distortion is seen. Mammographic images were processed with CAD. On physical exam, I palpate soft fat lobules in the far superior left breast at the 1 o'clock region far superiorly, in the region of patient concern. This is approximately 8-9 cm from the nipple. I  do not palpate a suspicious mass. Targeted ultrasound is performed, showing normal fatty breast parenchyma in the 1 o'clock region of the left breast 8-9 cm from the nipple in the  region of patient concern. No suspicious findings on ultrasound. IMPRESSION: No evidence of malignancy in the region of patient concern in the superior left breast. RECOMMENDATION: Recommendation for the left breast is a mammogram in 1 year. Please note that the patient had right-sided breast biopsies performed today, dictated separately, for which recommendations will follow after the pathology is available. I have discussed the findings and recommendations with the patient. Results were also provided in writing at the conclusion of the visit. If applicable, a reminder letter will be sent to the patient regarding the next appointment. BI-RADS CATEGORY  1: Negative. Electronically Signed   By: Curlene Dolphin M.D.   On: 12/08/2018 14:24   US Breast Ltd Uni Right Inc Axilla  Result Date: 12/03/2018 CLINICAL DATA:  Newly palpable lump in the right breast. EXAM: DIGITAL DIAGNOSTIC BILATERAL MAMMOGRAM WITH CAD AND TOMO ULTRASOUND RIGHT BREAST COMPARISON:  Previous exam(s). ACR Breast Density Category c: The breast tissue is heterogeneously dense, which may obscure small masses. FINDINGS: There is a new irregular mass in the right breast measuring approximately 2 cm mammographically. There are calcifications in and anterior to the mass. The calcifications anterior to the mass span 11 mm. The mass and the calcifications taken together span 2.8 cm. Mammographic images were processed with CAD. On physical exam, there is a firm lump in the lateral right breast. Targeted ultrasound is performed, showing an irregular mass containing calcifications in the right breast at 7 o'clock, 2 cm from the nipple measuring 2.1 x 1.3 by 1.6 cm. There are calcifications within the mass. Two borderline lymph nodes are seen in the right axilla. One of the nodes demonstrates a cortex of 4.4 mm. Both lymph nodes demonstrate retention of fatty hila. IMPRESSION: Suspicious mass with calcifications in the right breast as above. Two borderline to  mildly abnormal lymph nodes in the right axilla. RECOMMENDATION: Recommend ultrasound-guided biopsy of the right breast mass. Recommend ultrasound-guided biopsy of 1 of the 2 mildly abnormal right axillary lymph nodes. I have discussed the findings and recommendations with the patient. Results were also provided in writing at the conclusion of the visit. If applicable, a reminder letter will be sent to the patient regarding the next appointment. BI-RADS CATEGORY  5: Highly suggestive of malignancy. Electronically Signed   By: Dorise Bullion III M.D   On: 12/03/2018 15:05   Mm Diag Breast Tomo Uni Left  Result Date: 12/08/2018 CLINICAL DATA:  53 year old patient presents today for a biopsy of the right breast and of the right axilla. Since she was last seen here for her diagnostic mammogram and ultrasound on December 03, 2018, she has noticed a palpable lump in the superior left breast, 12-1 o'clock region. This area in the left breast was evaluated today after her biopsies were performed today. EXAM: DIGITAL DIAGNOSTIC LEFT MAMMOGRAM WITH CAD AND TOMO ULTRASOUND LEFT BREAST COMPARISON:  December 03, 2018 ACR Breast Density Category c: The breast tissue is heterogeneously dense, which may obscure small masses. FINDINGS: Metallic skin marker was placed in the region of patient concern in the superior left breast and a spot tangential view of the region of palpable concern was performed. In this region of the left breast, normal fatty breast parenchyma is imaged. There is a normal intramammary lymph node deep to the marker. No suspicious  mass, microcalcification, or distortion is seen. Mammographic images were processed with CAD. On physical exam, I palpate soft fat lobules in the far superior left breast at the 1 o'clock region far superiorly, in the region of patient concern. This is approximately 8-9 cm from the nipple. I do not palpate a suspicious mass. Targeted ultrasound is performed, showing normal fatty breast  parenchyma in the 1 o'clock region of the left breast 8-9 cm from the nipple in the region of patient concern. No suspicious findings on ultrasound. IMPRESSION: No evidence of malignancy in the region of patient concern in the superior left breast. RECOMMENDATION: Recommendation for the left breast is a mammogram in 1 year. Please note that the patient had right-sided breast biopsies performed today, dictated separately, for which recommendations will follow after the pathology is available. I have discussed the findings and recommendations with the patient. Results were also provided in writing at the conclusion of the visit. If applicable, a reminder letter will be sent to the patient regarding the next appointment. BI-RADS CATEGORY  1: Negative. Electronically Signed   By: Curlene Dolphin M.D.   On: 12/08/2018 14:24   Mm Diag Breast Tomo Bilateral  Result Date: 12/03/2018 CLINICAL DATA:  Newly palpable lump in the right breast. EXAM: DIGITAL DIAGNOSTIC BILATERAL MAMMOGRAM WITH CAD AND TOMO ULTRASOUND RIGHT BREAST COMPARISON:  Previous exam(s). ACR Breast Density Category c: The breast tissue is heterogeneously dense, which may obscure small masses. FINDINGS: There is a new irregular mass in the right breast measuring approximately 2 cm mammographically. There are calcifications in and anterior to the mass. The calcifications anterior to the mass span 11 mm. The mass and the calcifications taken together span 2.8 cm. Mammographic images were processed with CAD. On physical exam, there is a firm lump in the lateral right breast. Targeted ultrasound is performed, showing an irregular mass containing calcifications in the right breast at 7 o'clock, 2 cm from the nipple measuring 2.1 x 1.3 by 1.6 cm. There are calcifications within the mass. Two borderline lymph nodes are seen in the right axilla. One of the nodes demonstrates a cortex of 4.4 mm. Both lymph nodes demonstrate retention of fatty hila. IMPRESSION:  Suspicious mass with calcifications in the right breast as above. Two borderline to mildly abnormal lymph nodes in the right axilla. RECOMMENDATION: Recommend ultrasound-guided biopsy of the right breast mass. Recommend ultrasound-guided biopsy of 1 of the 2 mildly abnormal right axillary lymph nodes. I have discussed the findings and recommendations with the patient. Results were also provided in writing at the conclusion of the visit. If applicable, a reminder letter will be sent to the patient regarding the next appointment. BI-RADS CATEGORY  5: Highly suggestive of malignancy. Electronically Signed   By: Dorise Bullion III M.D   On: 12/03/2018 15:05   Mm Clip Placement Right  Result Date: 12/08/2018 CLINICAL DATA:  Ultrasound-guided core needle biopsy was performed of a suspicious mass in the 7 o'clock position of the right breast and of a right axillary lymph node with mild cortical thickening. EXAM: DIAGNOSTIC RIGHT MAMMOGRAM POST ULTRASOUND BIOPSIES COMPARISON:  Previous exam(s). FINDINGS: Mammographic images were obtained following ultrasound guided biopsy of a suspicious right breast mass at 7 o'clock position. A coil shaped biopsy clip is satisfactorily positioned within the suspicious mass. Again noted are calcifications within the mass. Similar-appearing calcifications extend anterior to the mass. HydroMARK biopsy clip is seen within the right axilla in the expected location of the biopsied lymph node. IMPRESSION: Satisfactory  position of coil shaped biopsy clip within the suspicious right breast mass. HydroMARK biopsy clip confirmed in the right axilla, in the expected region of the axillary node biopsy. Final Assessment: Post Procedure Mammograms for Marker Placement Electronically Signed   By: Curlene Dolphin M.D.   On: 12/08/2018 14:27   [place] Rt Breast Bx W Loc Dev 1st Lesion Img Bx Spec US Guide  Addendum Date: 12/11/2018   ADDENDUM REPORT: 12/09/2018 16:08 ADDENDUM: PATHOLOGY revealed: A.  BREAST, RIGHT 7:00 2 CM FN; - INVASIVE MAMMARY CARCINOMA, NO SPECIAL TYPE. 9 mm in this sample. Grade 3. 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 Lymphovascular invasion: Present. B. LYMPH NODE, RIGHT AXILLA; ULTRASOUND-GUIDED BIOPSY: - LYMPH NODE, NEGATIVE FOR MALIGNANCY. Pathology results are CONCORDANT with imaging findings, per Dr. Curlene Dolphin. Pathology results were discussed with patient via telephone. The patient reported doing well after the biopsy with tenderness at the site. Post biopsy care instructions were reviewed and questions were answered. The patient was encouraged to call [facility] for any additional concerns. Recommendation: Surgical referral. Request for surgical referral was relayed to nurse navigators at [facility] by Electa Sniff RN on 12/09/2018. Addendum by Electa Sniff RN on 12/09/2018. Electronically Signed   By: Curlene Dolphin M.D.   On: 12/09/2018 16:08   Result Date: 12/11/2018 CLINICAL DATA:  Ultrasound-guided core needle biopsy was recommended of a suspicious mass in the 7 o'clock position of the right breast. EXAM: ULTRASOUND GUIDED RIGHT BREAST CORE NEEDLE BIOPSY COMPARISON:  Previous exam(s). FINDINGS: I met with the patient and we discussed the procedure of ultrasound-guided biopsy, including benefits and alternatives. We discussed the high likelihood of a successful procedure. We discussed the risks of the procedure, including infection, bleeding, tissue injury, clip migration, and inadequate sampling. Informed written consent was given. The usual time-out protocol was performed immediately prior to the procedure. Lesion quadrant: Lower outer quadrant Using sterile technique and 1% Lidocaine as local anesthetic, under direct ultrasound visualization, a 12 gauge spring-loaded device was used to perform biopsy of an approximately 2.1 cm mass at 7 o'clock position right breast using a lateral approach. At the conclusion of the procedure a  coil tissue marker clip was deployed into the biopsy cavity. Follow up 2 view mammogram was performed and dictated separately. IMPRESSION: Ultrasound guided biopsy of the right breast. No apparent complications. Electronically Signed: By: Curlene Dolphin M.D. On: 12/08/2018 17:03   [place] Rt Breast Bx W Loc Dev Ea Add Lesion Img Bx Spec US Guide  Addendum Date: 12/11/2018   ADDENDUM REPORT: 12/09/2018 16:06 ADDENDUM: PATHOLOGY revealed: A. BREAST, RIGHT 7:00 2 CM FN; - INVASIVE MAMMARY CARCINOMA, NO SPECIAL TYPE. 9 mm in this sample. Grade 3. 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 Lymphovascular invasion: Present. B. LYMPH NODE, RIGHT AXILLA; ULTRASOUND-GUIDED BIOPSY: - LYMPH NODE, NEGATIVE FOR MALIGNANCY. Pathology results are CONCORDANT with imaging findings, per Dr. Curlene Dolphin. Pathology results were discussed with patient via telephone. The patient reported doing well after the biopsy with tenderness at the site. Post biopsy care instructions were reviewed and questions were answered. The patient was encouraged to call [facility] for any additional concerns. Recommendation: Surgical referral. Request for surgical referral was relayed to nurse navigators at [facility] by Electa Sniff RN on 12/09/2018. Addendum by Electa Sniff RN on 12/09/2018. Electronically Signed   By: Curlene Dolphin M.D.   On: 12/09/2018 16:06   Result Date: 12/11/2018 CLINICAL DATA:  Ultrasound-guided  core needle biopsy was recommended of a right axillary lymph node. EXAM: [place] AXILLARY NODE CORE BIOPSY RIGHT COMPARISON:  Previous exam(s). FINDINGS: I met with the patient and we discussed the procedure of ultrasound-guided biopsy, including benefits and alternatives. We discussed the high likelihood of a successful procedure. We discussed the risks of the procedure, including infection, bleeding, tissue injury, clip migration, and inadequate sampling. Informed written consent was given. The usual time-out  protocol was performed immediately prior to the procedure. Using sterile technique and 1% Lidocaine as local anesthetic, under direct ultrasound visualization, a 14 gauge spring-loaded device was used to perform biopsy of A RIGHT AXILLARY LYMPH NODE WITH A CORTICAL THICKNESS OF APPROXIMATELY 4 MM using a lateral approach. At the conclusion of the procedure a HydroMARK tissue marker clip was deployed into the biopsy cavity. Follow up 2 view mammogram was performed and dictated separately. IMPRESSION: Ultrasound guided biopsy of the right axilla. No apparent complications. Electronically Signed: By: Curlene Dolphin M.D. On: 12/08/2018 17:02      ASSESSMENT & PLAN:  1. Malignant neoplasm of lower-outer quadrant of right breast of female, estrogen receptor positive ([place])   2. BRCA2 gene mutation positive in female   3. Family history of cancer    Stage IB pT2 pN0 M0 ER + PR+ HER2 negative right breast cancer, grade 3, +LVI,   -BRCA2 positive #OncotypeDX recurrence score 31, adjuvant chemotherapy benefit more than 15%. Recommend adjuvant chemotherapy. I had a lengthy discussion with patient and explaining the rationale of using OncotypeDx for predicting recurrence and chemotherapy benefit. Recommend dose dense AC followed by Taxol treatments. I explained to the patient the risks and benefits of chemotherapy including all but not limited to infusion reaction, hair loss, hearing loss, mouth sore, nausea, vomiting, low blood counts, bleeding, heart failure, kidney failure and risk of life threatening infection, and even death, neuropathy, secondary malignancy etc. scalp cooling system option was discussed with patient.  She is not interested.   Patient voices understanding and willing to proceed chemotherapy.   #Obtain MUGA for baseline assessment of cardiac function.   # Chemotherapy education; port placement. Antiemetics-Zofran and Compazine; EMLA cream sent to pharmacy   #BRCA gene mutation  positive. Patient will establish care with gynecologist for evaluation and prophylactic oophorectomy. Discussed with patient to give adjuvant chemotherapy priority.  Also recommend patient to update plastic surgery about the need of adjuvant chemotherapy .Screening for pancreatic cancer is controversial.  Will discuss in the future.   Orders Placed This Encounter  Procedures   NM Cardiac Muga Rest    Standing Status:   Future    Standing Expiration Date:   02/05/2020    Order Specific Question:   Preferred imaging location?    Answer:   [facility]    Order Specific Question:   Radiology Contrast Protocol - do NOT remove file path    Answer:   [email or web address]   Ambulatory referral to General Surgery    Referral Priority:   Routine    Referral Type:   Surgical    Referral Reason:   Specialty Services Required    Referred to Provider:   Herbert Pun, MD    Requested Specialty:   General Surgery    Number of Visits Requested:   1    All questions were answered. The patient knows to call the clinic with any problems questions or concerns.  Return of visit: 2 weeks.  We spent sufficient time to discuss many aspect of care, questions were  answered to patient's satisfaction. Total face to face encounter time for this patient visit was 40 min. >50% of the time was  spent in counseling and coordination of care.      Earlie Server, MD, PhD Hematology Oncology [facility] Pager- [phone] 02/07/2019

## 2019-02-08 ENCOUNTER — Encounter: Payer: Self-pay | Admitting: Oncology

## 2019-02-08 ENCOUNTER — Telehealth: Payer: Self-pay

## 2019-02-08 ENCOUNTER — Encounter: Payer: Self-pay | Admitting: Plastic Surgery

## 2019-02-08 NOTE — Telephone Encounter (Signed)
Patient called stating that Dr. Tasia Catchings and her had discussed starting chemo 4-5 weeks after the surgery she recently had on 10/8. Chemo scheduled on 11/9, but pt still feels it's too soon and prefers to do on 11/20 (week 6 after surgery) after she follows up with Dr.Cintron. Ok per Dr. Tasia Catchings.  Patient was also notified that referral for port placement was sent to Dr. Peyton Najjar and they will call her with appt details.

## 2019-02-08 NOTE — Telephone Encounter (Signed)
Patient informed of appt with Dr. Peyton Najjar on 02/12/2019 to discuss port placement.  She is scheduled to start chemo on 02/26/19.  Also informed her of pre-meds sent to pharmacy.

## 2019-02-08 NOTE — Telephone Encounter (Signed)
After patient's fill on Friday, patient visited oncologist. She will have to start chemotherapy, not radiation, in 4-5 weeks. Patient is concerned about medi-port placement and how it could affect tissue expanders. Patient will also need a Muga test and needs to know if there are any images that she cannot have done due to metal port in the expanders. She would like to know any restrictions in relation to her expanders. She also wants to know if expanders can stay in place for ~5 months of chemotherapy.

## 2019-02-08 NOTE — Telephone Encounter (Signed)
Evelina Dun, RN  Devona Konig, CMA        Good morning,   Pt does not need appt for port placement. She has established care with another surgeon.   Thank you

## 2019-02-08 NOTE — Telephone Encounter (Signed)
Ok thanks 

## 2019-02-09 ENCOUNTER — Inpatient Hospital Stay: Payer: BC Managed Care – PPO

## 2019-02-09 ENCOUNTER — Other Ambulatory Visit: Payer: BC Managed Care – PPO

## 2019-02-09 ENCOUNTER — Inpatient Hospital Stay: Payer: BC Managed Care – PPO | Admitting: Oncology

## 2019-02-09 ENCOUNTER — Ambulatory Visit: Payer: Self-pay | Admitting: General Surgery

## 2019-02-10 ENCOUNTER — Ambulatory Visit: Payer: BC Managed Care – PPO | Admitting: Obstetrics and Gynecology

## 2019-02-10 ENCOUNTER — Other Ambulatory Visit: Payer: BC Managed Care – PPO

## 2019-02-10 ENCOUNTER — Encounter: Payer: Self-pay | Admitting: Oncology

## 2019-02-10 ENCOUNTER — Ambulatory Visit: Payer: Self-pay | Admitting: General Surgery

## 2019-02-10 NOTE — H&P (Signed)
HISTORY OF PRESENT ILLNESS:    Cheryl Mooney is a 53 y.o.female patient who comes for evaluation of insertion of Port a Cath.  Patient with mammogram and ultrasound on August showing a right breast mass highly suspicious of breast cancer. She had core needle biopsy and showed invasive mammary carcinoma. Due to her mother being positive for BRCA2, she was tested and was found to be positive for the gene of BRCA 2. She underwent bilateral mastectomy with reconstruction. She has been healing well. Oncotype recurrence was high and chemotherapy was recommended.    Patient reports that the surgical area is healing well but is having a lot of burning sensation on the chest in different parts from medially to lateral aspect. No skin rashes or changes. No fever. No drainage.       PAST MEDICAL HISTORY:      Past Medical History:  Diagnosis Date  . GERD (gastroesophageal reflux disease)   . History of cancer Aug 2020   Breast cancer, also had a skin cancer removed a year ago  . Hyperlipidemia Aug. 2020   Noted at my annual physical but no medication needed  . Hypertension         PAST SURGICAL HISTORY:        Past Surgical History:  Procedure Laterality Date  . COLON SURGERY  2016   Colonoscopy-polyps removed  . COLONOSCOPY  2016  . MASTECTOMY Bilateral 01/14/2019   Dr Lesli Albee  -- simple Lt / total Rt w/ SN         MEDICATIONS:  Encounter Medications        Outpatient Encounter Medications as of 02/10/2019  Medication Sig Dispense Refill  . acetaminophen (TYLENOL) 500 MG tablet Take by mouth    . bisacodyL (DULCOLAX) 5 mg EC tablet Take by mouth    . dexAMETHasone (DECADRON) 4 MG tablet Take 2 tablets by mouth once a day on the day after chemotherapy and then take 2 tablets two times a day for 2 days. Take with food.    . diazePAM (VALIUM) 2 MG tablet TAKE 1 TABLET BY MOUTH EVERY 12 HOURS AS NEEDED FOR MUSCLE SPASM    . HYDROcodone-acetaminophen (NORCO) 5-325 mg  tablet Take 1 tablet by mouth every 4 (four) hours as needed for Pain 20 tablet 0  . lidocaine-prilocaine (EMLA) cream Apply to affected area once    . lisinopriL-hydrochlorothiazide (ZESTORETIC) 20-25 mg tablet Take by mouth Take 1 tablet by mouth daily    . loratadine (CLARITIN) 10 mg tablet Take by mouth Take 10 mg by mouth daily.    Marland Kitchen NARCAN 4 mg/actuation nasal spray CALL 911. ADMINISTER A SINGLE SPRAY OF NARCAN IN ONE NOSTRIL. REPEAT EVERY 3 MINUTES AS NEEDED IF NO OR MINIMAL RESPONSE.    Marland Kitchen omeprazole (PRILOSEC) 40 MG DR capsule Take by mouth Take 1 capsule (40 mg total) by mouth daily    . ondansetron (ZOFRAN) 4 MG tablet TAKE 1 TABLET BY MOUTH ONCE DAILY AS NEEDED FOR UP TO 10 DAYS FOR NAUSEA AND VOMITING    . prochlorperazine (COMPAZINE) 10 MG tablet Take by mouth     No facility-administered encounter medications on file as of 02/10/2019.        ALLERGIES:   Patient has no known allergies.   SOCIAL HISTORY:  Social History          Socioeconomic History  . Marital status: Married    Spouse name: Not on file  . Number of children: Not  on file  . Years of education: Not on file  . Highest education level: Not on file  Occupational History  . Not on file  Social Needs  . Financial resource strain: Not on file  . Food insecurity    Worry: Not on file    Inability: Not on file  . Transportation needs    Medical: Not on file    Non-medical: Not on file  Tobacco Use  . Smoking status: Light Tobacco Smoker    Packs/day: 0.00    Years: 25.00    Pack years: 0.00    Types: Cigarettes    Start date: 05/07/1993    Last attempt to quit: 12/08/2018    Years since quitting: 0.1  . Smokeless tobacco: Never Used  . Tobacco comment: Intermittent smoker, inevening at home and always < 1/2 pack per day . Since Aug. only approx . 3 cig  Substance and Sexual Activity  . Alcohol use: Yes    Comment: Average 4-5 glasses of wine per week  .  Drug use: Never  . Sexual activity: Yes    Partners: Male    Birth control/protection: Post-menopausal  Other Topics Concern  . Not on file  Social History Narrative  . Not on file      FAMILY HISTORY:       Family History  Problem Relation Age of Onset  . Breast cancer Mother   . Colon cancer Mother   . High blood pressure (Hypertension) Mother        Mother did not have high blood pressure  . High blood pressure (Hypertension) Father   . Prostate cancer Father   . Alzheimer's disease Father   . Breast cancer Maternal Grandmother   . Pancreatic cancer Maternal Grandfather      GENERAL REVIEW OF SYSTEMS:   General ROS: negative for - chills, fatigue, fever, weight gain or weight loss Allergy and Immunology ROS: negative for - hives  Hematological and Lymphatic ROS: negative for - bleeding problems or bruising, negative for palpable nodes Endocrine ROS: negative for - heat or cold intolerance, hair changes Respiratory ROS: negative for - cough, shortness of breath or wheezing Cardiovascular ROS: no chest pain or palpitations GI ROS: negative for nausea, vomiting, abdominal pain, diarrhea, constipation Musculoskeletal ROS: negative for - joint swelling or muscle pain Neurological ROS: negative for - confusion, syncope Dermatological ROS: negative for pruritus and rash  PHYSICAL EXAM:     Vitals:   02/10/19 0859  BP: 155/87  Pulse: 79  .  Ht:165.1 cm (5' 5" ) Wt:71.7 kg (158 lb) IWL:NLGX surface area is 1.81 meters squared. Body mass index is 26.29 kg/m.Marland Kitchen   GENERAL: Alert, active, oriented x3  BREAST: bilateral chest evaluation with healthy skin flaps. Adequate capillary refill. No skin changes. Wounds are dry and clean. No edema.   NECK: Supple with no palpable mass and no adenopathy.  LUNGS: Sound clear with no rales rhonchi or wheezes.  HEART: Regular rhythm S1 and S2 without murmur.  ABDOMEN: Soft and depressible, nontender with no  palpable mass, no hepatomegaly.   EXTREMITIES: Well-developed well-nourished symmetrical with no dependent edema.  NEUROLOGICAL: Awake alert oriented, facial expression symmetrical, moving all extremities.      IMPRESSION:     Malignant neoplasm of breast with BRCA2 gene mutation, right (CMS-HCC) [C50.911, Z15.01] -Status post bilateral mastectomy and bilateral sentinel lymph node biopsy with immediate reconstruction -High Oncotype recurrence score. Will place Port a Cath for chemotherapy as recommended by Oncologist.  -  Patient oriented about surgical procedure, benefits and risks. Risks discussed with patient was bleeding, infection, pneumo/hemo thorax, arteriovenous fistula, blood clots, among others.  -Will start Gabapentin for burning pain around the chest area.           PLAN:  1. Insertion of Port a Cath [phone]) 2. Avoid taking aspirin 5 days before surgery 3. Contact us if has any question or concern.  Patient verbalized understanding, all questions were answered, and were agreeable with the plan outlined above.   Herbert Pun, MD  Electronically signed by Herbert Pun, MD

## 2019-02-10 NOTE — Patient Instructions (Signed)
Doxorubicin injection What is this medicine? DOXORUBICIN (dox oh ROO bi sin) is a chemotherapy drug. It is used to treat many kinds of cancer like leukemia, lymphoma, neuroblastoma, sarcoma, and Wilms' tumor. It is also used to treat bladder cancer, breast cancer, lung cancer, ovarian cancer, stomach cancer, and thyroid cancer. This medicine may be used for other purposes; ask your health care provider or pharmacist if you have questions. COMMON BRAND NAME(S): Adriamycin, Adriamycin PFS, Adriamycin RDF, Rubex What should I tell my health care provider before I take this medicine? They need to know if you have any of these conditions:  heart disease  history of low blood counts caused by a medicine  liver disease  recent or ongoing radiation therapy  an unusual or allergic reaction to doxorubicin, other chemotherapy agents, other medicines, foods, dyes, or preservatives  pregnant or trying to get pregnant  breast-feeding How should I use this medicine? This drug is given as an infusion into a vein. It is administered in a hospital or clinic by a specially trained health care professional. If you have pain, swelling, burning or any unusual feeling around the site of your injection, tell your health care professional right away. Talk to your pediatrician regarding the use of this medicine in children. Special care may be needed. Overdosage: If you think you have taken too much of this medicine contact a poison control center or emergency room at once. NOTE: This medicine is only for you. Do not share this medicine with others. What if I miss a dose? It is important not to miss your dose. Call your doctor or health care professional if you are unable to keep an appointment. What may interact with this medicine? This medicine may interact with the following medications:  6-mercaptopurine  paclitaxel  phenytoin  St. John's Wort  trastuzumab  verapamil This list may not describe  all possible interactions. Give your health care provider a list of all the medicines, herbs, non-prescription drugs, or dietary supplements you use. Also tell them if you smoke, drink alcohol, or use illegal drugs. Some items may interact with your medicine. What should I watch for while using this medicine? This drug may make you feel generally unwell. This is not uncommon, as chemotherapy can affect healthy cells as well as cancer cells. Report any side effects. Continue your course of treatment even though you feel ill unless your doctor tells you to stop. There is a maximum amount of this medicine you should receive throughout your life. The amount depends on the medical condition being treated and your overall health. Your doctor will watch how much of this medicine you receive in your lifetime. Tell your doctor if you have taken this medicine before. You may need blood work done while you are taking this medicine. Your urine may turn red for a few days after your dose. This is not blood. If your urine is dark or brown, call your doctor. In some cases, you may be given additional medicines to help with side effects. Follow all directions for their use. Call your doctor or health care professional for advice if you get a fever, chills or sore throat, or other symptoms of a cold or flu. Do not treat yourself. This drug decreases your body's ability to fight infections. Try to avoid being around people who are sick. This medicine may increase your risk to bruise or bleed. Call your doctor or health care professional if you notice any unusual bleeding. Talk to your doctor   about your risk of cancer. You may be more at risk for certain types of cancers if you take this medicine. Do not become pregnant while taking this medicine or for 6 months after stopping it. Women should inform their doctor if they wish to become pregnant or think they might be pregnant. Men should not father a child while taking this  medicine and for 6 months after stopping it. There is a potential for serious side effects to an unborn child. Talk to your health care professional or pharmacist for more information. Do not breast-feed an infant while taking this medicine. This medicine has caused ovarian failure in some women and reduced sperm counts in some men This medicine may interfere with the ability to have a child. Talk with your doctor or health care professional if you are concerned about your fertility. This medicine may cause a decrease in Co-Enzyme Q-10. You should make sure that you get enough Co-Enzyme Q-10 while you are taking this medicine. Discuss the foods you eat and the vitamins you take with your health care professional. What side effects may I notice from receiving this medicine? Side effects that you should report to your doctor or health care professional as soon as possible:  allergic reactions like skin rash, itching or hives, swelling of the face, lips, or tongue  breathing problems  chest pain  fast or irregular heartbeat  low blood counts - this medicine may decrease the number of white blood cells, red blood cells and platelets. You may be at increased risk for infections and bleeding.  pain, redness, or irritation at site where injected  signs of infection - fever or chills, cough, sore throat, pain or difficulty passing urine  signs of decreased platelets or bleeding - bruising, pinpoint red spots on the skin, black, tarry stools, blood in the urine  swelling of the ankles, feet, hands  tiredness  weakness Side effects that usually do not require medical attention (report to your doctor or health care professional if they continue or are bothersome):  diarrhea  hair loss  mouth sores  nail discoloration or damage  nausea  red colored urine  vomiting This list may not describe all possible side effects. Call your doctor for medical advice about side effects. You may report  side effects to FDA at 1-800-FDA-1088. Where should I keep my medicine? This drug is given in a hospital or clinic and will not be stored at home. NOTE: This sheet is a summary. It may not cover all possible information. If you have questions about this medicine, talk to your doctor, pharmacist, or health care provider.  2020 [employer] Standard (2016-11-06 11:01:26) Cyclophosphamide injection What is this medicine? CYCLOPHOSPHAMIDE (sye kloe FOSS fa mide) is a chemotherapy drug. It slows the growth of cancer cells. This medicine is used to treat many types of cancer like lymphoma, myeloma, leukemia, breast cancer, and ovarian cancer, to name a few. This medicine may be used for other purposes; ask your health care provider or pharmacist if you have questions. COMMON BRAND NAME(S): Cytoxan, Neosar What should I tell my health care provider before I take this medicine? They need to know if you have any of these conditions:  blood disorders  history of other chemotherapy  infection  kidney disease  liver disease  recent or ongoing radiation therapy  tumors in the bone marrow  an unusual or allergic reaction to cyclophosphamide, other chemotherapy, other medicines, foods, dyes, or preservatives  pregnant or trying to get  pregnant  breast-feeding How should I use this medicine? This drug is usually given as an injection into a vein or muscle or by infusion into a vein. It is administered in a hospital or clinic by a specially trained health care professional. Talk to your pediatrician regarding the use of this medicine in children. Special care may be needed. Overdosage: If you think you have taken too much of this medicine contact a poison control center or emergency room at once. NOTE: This medicine is only for you. Do not share this medicine with others. What if I miss a dose? It is important not to miss your dose. Call your doctor or health care professional if you are  unable to keep an appointment. What may interact with this medicine? This medicine may interact with the following medications:  amiodarone  amphotericin B  azathioprine  certain antiviral medicines for HIV or AIDS such as protease inhibitors (e.g., indinavir, ritonavir) and zidovudine  certain blood pressure medications such as benazepril, captopril, enalapril, fosinopril, lisinopril, moexipril, monopril, perindopril, quinapril, ramipril, trandolapril  certain cancer medications such as anthracyclines (e.g., daunorubicin, doxorubicin), busulfan, cytarabine, paclitaxel, pentostatin, tamoxifen, trastuzumab  certain diuretics such as chlorothiazide, chlorthalidone, hydrochlorothiazide, indapamide, metolazone  certain medicines that treat or prevent blood clots like warfarin  certain muscle relaxants such as succinylcholine  cyclosporine  etanercept  indomethacin  medicines to increase blood counts like filgrastim, pegfilgrastim, sargramostim  medicines used as general anesthesia  metronidazole  natalizumab This list may not describe all possible interactions. Give your health care provider a list of all the medicines, herbs, non-prescription drugs, or dietary supplements you use. Also tell them if you smoke, drink alcohol, or use illegal drugs. Some items may interact with your medicine. What should I watch for while using this medicine? Visit your doctor for checks on your progress. This drug may make you feel generally unwell. This is not uncommon, as chemotherapy can affect healthy cells as well as cancer cells. Report any side effects. Continue your course of treatment even though you feel ill unless your doctor tells you to stop. Drink water or other fluids as directed. Urinate often, even at night. In some cases, you may be given additional medicines to help with side effects. Follow all directions for their use. Call your doctor or health care professional for advice if  you get a fever, chills or sore throat, or other symptoms of a cold or flu. Do not treat yourself. This drug decreases your body's ability to fight infections. Try to avoid being around people who are sick. This medicine may increase your risk to bruise or bleed. Call your doctor or health care professional if you notice any unusual bleeding. Be careful brushing and flossing your teeth or using a toothpick because you may get an infection or bleed more easily. If you have any dental work done, tell your dentist you are receiving this medicine. You may get drowsy or dizzy. Do not drive, use machinery, or do anything that needs mental alertness until you know how this medicine affects you. Do not become pregnant while taking this medicine or for 1 year after stopping it. Women should inform their doctor if they wish to become pregnant or think they might be pregnant. Men should not father a child while taking this medicine and for 4 months after stopping it. There is a potential for serious side effects to an unborn child. Talk to your health care professional or pharmacist for more information. Do not breast-feed  an infant while taking this medicine. This medicine may interfere with the ability to have a child. This medicine has caused ovarian failure in some women. This medicine has caused reduced sperm counts in some men. You should talk with your doctor or health care professional if you are concerned about your fertility. If you are going to have surgery, tell your doctor or health care professional that you have taken this medicine. What side effects may I notice from receiving this medicine? Side effects that you should report to your doctor or health care professional as soon as possible:  allergic reactions like skin rash, itching or hives, swelling of the face, lips, or tongue  low blood counts - this medicine may decrease the number of white blood cells, red blood cells and platelets. You may be  at increased risk for infections and bleeding.  signs of infection - fever or chills, cough, sore throat, pain or difficulty passing urine  signs of decreased platelets or bleeding - bruising, pinpoint red spots on the skin, black, tarry stools, blood in the urine  signs of decreased red blood cells - unusually weak or tired, fainting spells, lightheadedness  breathing problems  dark urine  dizziness  palpitations  swelling of the ankles, feet, hands  trouble passing urine or change in the amount of urine  weight gain  yellowing of the eyes or skin Side effects that usually do not require medical attention (report to your doctor or health care professional if they continue or are bothersome):  changes in nail or skin color  hair loss  missed menstrual periods  mouth sores  nausea, vomiting This list may not describe all possible side effects. Call your doctor for medical advice about side effects. You may report side effects to FDA at 1-800-FDA-1088. Where should I keep my medicine? This drug is given in a hospital or clinic and will not be stored at home. NOTE: This sheet is a summary. It may not cover all possible information. If you have questions about this medicine, talk to your doctor, pharmacist, or health care provider.  2020 [employer]/Gold Standard (2012-02-07 16:22:58) Paclitaxel injection What is this medicine? PACLITAXEL (PAK li TAX el) is a chemotherapy drug. It targets fast dividing cells, like cancer cells, and causes these cells to die. This medicine is used to treat ovarian cancer, breast cancer, lung cancer, Kaposi's sarcoma, and other cancers. This medicine may be used for other purposes; ask your health care provider or pharmacist if you have questions. COMMON BRAND NAME(S): Onxol, Taxol What should I tell my health care provider before I take this medicine? They need to know if you have any of these conditions:  history of irregular  heartbeat  liver disease  low blood counts, like low white cell, platelet, or red cell counts  lung or breathing disease, like asthma  tingling of the fingers or toes, or other nerve disorder  an unusual or allergic reaction to paclitaxel, alcohol, polyoxyethylated castor oil, other chemotherapy, other medicines, foods, dyes, or preservatives  pregnant or trying to get pregnant  breast-feeding How should I use this medicine? This drug is given as an infusion into a vein. It is administered in a hospital or clinic by a specially trained health care professional. Talk to your pediatrician regarding the use of this medicine in children. Special care may be needed. Overdosage: If you think you have taken too much of this medicine contact a poison control center or emergency room at once. NOTE: This  medicine is only for you. Do not share this medicine with others. What if I miss a dose? It is important not to miss your dose. Call your doctor or health care professional if you are unable to keep an appointment. What may interact with this medicine? Do not take this medicine with any of the following medications:  disulfiram  metronidazole This medicine may also interact with the following medications:  antiviral medicines for hepatitis, HIV or AIDS  certain antibiotics like erythromycin and clarithromycin  certain medicines for fungal infections like ketoconazole and itraconazole  certain medicines for seizures like carbamazepine, phenobarbital, phenytoin  gemfibrozil  nefazodone  rifampin  St. John's wort This list may not describe all possible interactions. Give your health care provider a list of all the medicines, herbs, non-prescription drugs, or dietary supplements you use. Also tell them if you smoke, drink alcohol, or use illegal drugs. Some items may interact with your medicine. What should I watch for while using this medicine? Your condition will be monitored  carefully while you are receiving this medicine. You will need important blood work done while you are taking this medicine. This medicine can cause serious allergic reactions. To reduce your risk you will need to take other medicine(s) before treatment with this medicine. If you experience allergic reactions like skin rash, itching or hives, swelling of the face, lips, or tongue, tell your doctor or health care professional right away. In some cases, you may be given additional medicines to help with side effects. Follow all directions for their use. This drug may make you feel generally unwell. This is not uncommon, as chemotherapy can affect healthy cells as well as cancer cells. Report any side effects. Continue your course of treatment even though you feel ill unless your doctor tells you to stop. Call your doctor or health care professional for advice if you get a fever, chills or sore throat, or other symptoms of a cold or flu. Do not treat yourself. This drug decreases your body's ability to fight infections. Try to avoid being around people who are sick. This medicine may increase your risk to bruise or bleed. Call your doctor or health care professional if you notice any unusual bleeding. Be careful brushing and flossing your teeth or using a toothpick because you may get an infection or bleed more easily. If you have any dental work done, tell your dentist you are receiving this medicine. Avoid taking products that contain aspirin, acetaminophen, ibuprofen, naproxen, or ketoprofen unless instructed by your doctor. These medicines may hide a fever. Do not become pregnant while taking this medicine. Women should inform their doctor if they wish to become pregnant or think they might be pregnant. There is a potential for serious side effects to an unborn child. Talk to your health care professional or pharmacist for more information. Do not breast-feed an infant while taking this medicine. Men are  advised not to father a child while receiving this medicine. This product may contain alcohol. Ask your pharmacist or healthcare provider if this medicine contains alcohol. Be sure to tell all healthcare providers you are taking this medicine. Certain medicines, like metronidazole and disulfiram, can cause an unpleasant reaction when taken with alcohol. The reaction includes flushing, headache, nausea, vomiting, sweating, and increased thirst. The reaction can last from 30 minutes to several hours. What side effects may I notice from receiving this medicine? Side effects that you should report to your doctor or health care professional as soon as possible:  allergic reactions like skin rash, itching or hives, swelling of the face, lips, or tongue  breathing problems  changes in vision  fast, irregular heartbeat  high or low blood pressure  mouth sores  pain, tingling, numbness in the hands or feet  signs of decreased platelets or bleeding - bruising, pinpoint red spots on the skin, black, tarry stools, blood in the urine  signs of decreased red blood cells - unusually weak or tired, feeling faint or lightheaded, falls  signs of infection - fever or chills, cough, sore throat, pain or difficulty passing urine  signs and symptoms of liver injury like dark yellow or brown urine; general ill feeling or flu-like symptoms; light-colored stools; loss of appetite; nausea; right upper belly pain; unusually weak or tired; yellowing of the eyes or skin  swelling of the ankles, feet, hands  unusually slow heartbeat Side effects that usually do not require medical attention (report to your doctor or health care professional if they continue or are bothersome):  diarrhea  hair loss  loss of appetite  muscle or joint pain  nausea, vomiting  pain, redness, or irritation at site where injected  tiredness This list may not describe all possible side effects. Call your doctor for medical  advice about side effects. You may report side effects to FDA at 1-800-FDA-1088. Where should I keep my medicine? This drug is given in a hospital or clinic and will not be stored at home. NOTE: This sheet is a summary. It may not cover all possible information. If you have questions about this medicine, talk to your doctor, pharmacist, or health care provider.  2020 [employer] Standard (2016-11-26 13:14:55)

## 2019-02-11 ENCOUNTER — Other Ambulatory Visit: Payer: BC Managed Care – PPO

## 2019-02-11 ENCOUNTER — Encounter: Payer: Self-pay | Admitting: Internal Medicine

## 2019-02-11 ENCOUNTER — Inpatient Hospital Stay (HOSPITAL_BASED_OUTPATIENT_CLINIC_OR_DEPARTMENT_OTHER): Payer: BC Managed Care – PPO | Admitting: Oncology

## 2019-02-11 ENCOUNTER — Ambulatory Visit: Payer: BC Managed Care – PPO | Admitting: Oncology

## 2019-02-11 ENCOUNTER — Encounter
Admission: RE | Admit: 2019-02-11 | Discharge: 2019-02-11 | Disposition: A | Discharge status: Home / Self Care | Payer: BC Managed Care – PPO | Source: Ambulatory Visit | Attending: General Surgery | Admitting: General Surgery

## 2019-02-11 ENCOUNTER — Encounter
Admission: RE | Admit: 2019-02-11 | Discharge: 2019-02-11 | Disposition: A | Discharge status: Home / Self Care | Payer: BC Managed Care – PPO | Source: Ambulatory Visit | Attending: Oncology | Admitting: Oncology

## 2019-02-11 ENCOUNTER — Ambulatory Visit: Payer: BC Managed Care – PPO

## 2019-02-11 ENCOUNTER — Other Ambulatory Visit: Payer: Self-pay

## 2019-02-11 ENCOUNTER — Inpatient Hospital Stay: Payer: BC Managed Care – PPO | Attending: Oncology

## 2019-02-11 ENCOUNTER — Other Ambulatory Visit: Payer: Self-pay | Admitting: Oncology

## 2019-02-11 DIAGNOSIS — K5900 Constipation, unspecified: Secondary | ICD-10-CM | POA: Insufficient documentation

## 2019-02-11 DIAGNOSIS — Z5189 Encounter for other specified aftercare: Secondary | ICD-10-CM | POA: Diagnosis present

## 2019-02-11 DIAGNOSIS — K219 Gastro-esophageal reflux disease without esophagitis: Secondary | ICD-10-CM | POA: Insufficient documentation

## 2019-02-11 DIAGNOSIS — Z801 Family history of malignant neoplasm of trachea, bronchus and lung: Secondary | ICD-10-CM | POA: Diagnosis not present

## 2019-02-11 DIAGNOSIS — Z79899 Other long term (current) drug therapy: Secondary | ICD-10-CM | POA: Insufficient documentation

## 2019-02-11 DIAGNOSIS — Z170 Estrogen receptor positive status [ER+]: Secondary | ICD-10-CM | POA: Insufficient documentation

## 2019-02-11 DIAGNOSIS — I10 Essential (primary) hypertension: Secondary | ICD-10-CM | POA: Insufficient documentation

## 2019-02-11 DIAGNOSIS — E871 Hypo-osmolality and hyponatremia: Secondary | ICD-10-CM | POA: Insufficient documentation

## 2019-02-11 DIAGNOSIS — Z803 Family history of malignant neoplasm of breast: Secondary | ICD-10-CM

## 2019-02-11 DIAGNOSIS — F17210 Nicotine dependence, cigarettes, uncomplicated: Secondary | ICD-10-CM | POA: Diagnosis not present

## 2019-02-11 DIAGNOSIS — C50511 Malignant neoplasm of lower-outer quadrant of right female breast: Secondary | ICD-10-CM

## 2019-02-11 DIAGNOSIS — D701 Agranulocytosis secondary to cancer chemotherapy: Secondary | ICD-10-CM | POA: Diagnosis not present

## 2019-02-11 DIAGNOSIS — Z5111 Encounter for antineoplastic chemotherapy: Secondary | ICD-10-CM | POA: Diagnosis present

## 2019-02-11 DIAGNOSIS — Z800 Family history of malignant neoplasm of digestive organs: Secondary | ICD-10-CM | POA: Insufficient documentation

## 2019-02-11 DIAGNOSIS — Z8249 Family history of ischemic heart disease and other diseases of the circulatory system: Secondary | ICD-10-CM | POA: Diagnosis not present

## 2019-02-11 DIAGNOSIS — M62838 Other muscle spasm: Secondary | ICD-10-CM | POA: Diagnosis not present

## 2019-02-11 DIAGNOSIS — R0789 Other chest pain: Secondary | ICD-10-CM | POA: Diagnosis not present

## 2019-02-11 MED ORDER — TECHNETIUM TC 99M-LABELED RED BLOOD CELLS IV KIT
20.0000 | PACK | Freq: Once | INTRAVENOUS | Status: AC | PRN
  Administered 2019-02-11: 21.82 via INTRAVENOUS

## 2019-02-11 MED ORDER — ONDANSETRON HCL 8 MG PO TABS: 8.0000 mg | ORAL_TABLET | Freq: Three times a day (TID) | ORAL | 0 refills | Status: DC | PRN

## 2019-02-11 NOTE — Progress Notes (Signed)
[place]  [phone] [phone]  Patient Care Team: Glean Hess, MD as PCP - General (Internal Medicine) Jannet Mantis, MD (Dermatology)   Name of the patient: Cheryl Mooney  [id number]  1965/09/19   Date of visit: 02/11/19  Diagnosis- Breast Cancer   Chief complaint/Reason for visit- Initial Meeting for [facility], preparing for starting chemotherapy  Heme/Onc history:  Oncology History  Malignant neoplasm of lower-outer quadrant of right breast of female, estrogen receptor positive ([place])  12/17/2018 Initial Diagnosis   Malignant neoplasm of lower-outer quadrant of right breast of female, estrogen receptor positive ([place])   01/21/2019 Cancer Staging   Staging form: Breast, AJCC 8th Edition - Pathologic stage from 01/21/2019: Stage IB (pT2, pN0, cM0, G3, ER+, PR+, HER2-) - Signed by Earlie Server, MD on 01/22/2019   02/26/2019 -  Chemotherapy   The patient had DOXOrubicin (ADRIAMYCIN) chemo injection 110 mg, 60 mg/m2 = 110 mg, Intravenous,  Once, 0 of 4 cycles palonosetron (ALOXI) injection 0.25 mg, 0.25 mg, Intravenous,  Once, 0 of 4 cycles pegfilgrastim-jmdb (FULPHILA) injection 6 mg, 6 mg, Subcutaneous,  Once, 0 of 4 cycles cyclophosphamide (CYTOXAN) 1,100 mg in sodium chloride 0.9 % 250 mL chemo infusion, 600 mg/m2 = 1,100 mg, Intravenous,  Once, 0 of 4 cycles PACLitaxel (TAXOL) 144 mg in sodium chloride 0.9 % 250 mL chemo infusion (</= 83m/m2), 80 mg/m2, Intravenous,  Once, 0 of 12 cycles fosaprepitant (EMEND) 150 mg, dexamethasone (DECADRON) 12 mg in sodium chloride 0.9 % 145 mL IVPB, , Intravenous,  Once, 0 of 4 cycles  for chemotherapy treatment.      Interval history-  [name] a 53yo female who presents to chemo care clinic today for initial meeting in preparation for starting chemotherapy. I introduced the chemo care clinic and we discussed that the role of the  clinic is to assist those who are at an increased risk of emergency room visits and/or complications during the course of chemotherapy treatment. We discussed that the increased risk takes into account factors such as age, performance status, and co-morbidities. We also discussed that for some, this might include barriers to care such as not having a primary care provider, lack of insurance/transportation, or not being able to afford medications. We discussed that the goal of the program is to help prevent unplanned ER visits and help reduce complications during chemotherapy. We do this by discussing specific risk factors to each individual and identifying ways that we can help improve these risk factors and reduce barriers to care.   ECOG FS:1 - Symptomatic but completely ambulatory  Review of systems- Review of Systems  Constitutional: Negative.  Negative for chills, fever, malaise/fatigue and weight loss.  HENT: Negative for congestion, ear pain and tinnitus.   Eyes: Negative.  Negative for blurred vision and double vision.  Respiratory: Negative.  Negative for cough, sputum production and shortness of breath.   Cardiovascular: Negative.  Negative for chest pain, palpitations and leg swelling.  Gastrointestinal: Negative.  Negative for abdominal pain, constipation, diarrhea, nausea and vomiting.  Genitourinary: Negative for dysuria, frequency and urgency.  Musculoskeletal: Negative for back pain and falls.       Chest wall nerve pain  Skin: Negative.  Negative for rash.  Neurological: Positive for sensory change. Negative for weakness and headaches.  Endo/Heme/Allergies: Negative.  Does not bruise/bleed easily.  Psychiatric/Behavioral: Negative.  Negative for depression. The patient is not nervous/anxious and does not  have insomnia.     Current treatment-dose dense AC followed by Taxol treatments  No Known Allergies  Past Medical History:  Diagnosis Date   Benign neoplasm of sigmoid  colon    Benign neoplasm of transverse colon    Breast cancer ([place])    Family history of BRCA2 gene positive    Family history of breast cancer    Family history of colon cancer    Family history of lung cancer    Family history of pancreatic cancer    GERD (gastroesophageal reflux disease)    History of kidney stones    Hypertension    Malignant neoplasm of lower-outer quadrant of right breast of female, estrogen receptor positive ([place]) 12/17/2018   Smoker     Past Surgical History:  Procedure Laterality Date   AXILLARY SENTINEL NODE BIOPSY Bilateral 01/14/2019   Procedure: AXILLARY SENTINEL NODE BIOPSY;  Surgeon: Herbert Pun, MD;  Location: [facility];  Service: General;  Laterality: Bilateral;   BREAST BIOPSY Right 12/08/2018   [place] bx of mass with calcs path pending   BREAST BIOPSY Right 12/08/2018   [place] bx of LN, path pending   BREAST RECONSTRUCTION WITH PLACEMENT OF TISSUE EXPANDER AND ALLODERM Bilateral 01/14/2019   Procedure: BILATERAL BREAST RECONSTRUCTION WITH PLACEMENT OF TISSUE EXPANDER AND FLEX HD;  Surgeon: Wallace Going, DO;  Location: [facility];  Service: Plastics;  Laterality: Bilateral;   COLONOSCOPY WITH PROPOFOL N/A 02/17/2015   Procedure: COLONOSCOPY WITH PROPOFOL;  Surgeon: Lucilla Lame, MD;  Location: [place];  Service: Endoscopy;  Laterality: N/A;   POLYPECTOMY  02/17/2015   Procedure: POLYPECTOMY;  Surgeon: Lucilla Lame, MD;  Location: [place];  Service: Endoscopy;;   SIMPLE MASTECTOMY WITH AXILLARY SENTINEL NODE BIOPSY Left 01/14/2019   Procedure: LEFT SIMPLE MASTECTOMY;  Surgeon: Herbert Pun, MD;  Location: [facility];  Service: General;  Laterality: Left;   TOTAL MASTECTOMY Right 01/14/2019   Procedure: RIGHT TOTAL MASTECTOMY;  Surgeon: Herbert Pun, MD;  Location: [facility];  Service: General;  Laterality: Right;   WISDOM TOOTH EXTRACTION      Social History   Socioeconomic History    Marital status: Married    Spouse name: Not on file   Number of children: Not on file   Years of education: Not on file   Highest education level: Not on file  Occupational History   Not on file  Social Needs   Financial resource strain: Not on file   Food insecurity    Worry: Not on file    Inability: Not on file   Transportation needs    Medical: Not on file    Non-medical: Not on file  Tobacco Use   Smoking status: Current Every Day Smoker    Packs/day: 0.25    Years: 20.00    Pack years: 5.00    Types: Cigarettes   Smokeless tobacco: Never Used   Tobacco comment: 3 cig. per day  Substance and Sexual Activity   Alcohol use: Yes    Alcohol/week: 2.0 standard drinks    Types: 2 Standard drinks or equivalent per week    Comment: occasional   Drug use: No   Sexual activity: Yes    Birth control/protection: Post-menopausal  Lifestyle   Physical activity    Days per week: Not on file    Minutes per session: Not on file   Stress: Not on file  Relationships   Social connections    Talks on phone: Not on file  Gets together: Not on file    Attends religious service: Not on file    Active member of club or organization: Not on file    Attends meetings of clubs or organizations: Not on file    Relationship status: Not on file   Intimate partner violence    Fear of current or ex partner: Not on file    Emotionally abused: Not on file    Physically abused: Not on file    Forced sexual activity: Not on file  Other Topics Concern   Not on file  Social History Narrative   Not on file    Family History  Problem Relation Age of Onset   Colon cancer Mother 72   Breast cancer Mother 73       "BRCA2+"   Hypertension Father    Stroke Father    Alzheimer's disease Father    Prostate cancer Father    Breast cancer Maternal Aunt        mat great aunt   Breast cancer Maternal Grandmother 16   Pancreatic cancer Maternal Grandfather 27      Current Outpatient Medications:    acetaminophen (TYLENOL) 500 MG tablet, Take 500 mg by mouth every 6 (six) hours as needed for mild pain. , Disp: , Rfl:    bisacodyl (DULCOLAX) 5 MG EC tablet, Take 5 mg by mouth daily as needed for mild constipation or moderate constipation., Disp: , Rfl:    cetirizine (ZYRTEC) 10 MG tablet, Take 10 mg by mouth daily., Disp: , Rfl:    dexamethasone (DECADRON) 4 MG tablet, Take 2 tablets by mouth once a day on the day after chemotherapy and then take 2 tablets two times a day for 2 days. Take with food., Disp: 30 tablet, Rfl: 1   diazepam (VALIUM) 2 MG tablet, Take 1 tablet (2 mg total) by mouth every 12 (twelve) hours as needed for muscle spasms., Disp: 20 tablet, Rfl: 0   lidocaine-prilocaine (EMLA) cream, Apply to affected area once, Disp: 30 g, Rfl: 3   lisinopril-hydrochlorothiazide (ZESTORETIC) 20-25 MG tablet, Take 1 tablet by mouth daily., Disp: 90 tablet, Rfl: 3   omeprazole (PRILOSEC) 40 MG capsule, Take 1 capsule (40 mg total) by mouth daily., Disp: 90 capsule, Rfl: 1   prochlorperazine (COMPAZINE) 10 MG tablet, Take 1 tablet (10 mg total) by mouth every 6 (six) hours as needed (Nausea or vomiting)., Disp: 30 tablet, Rfl: 1  Physical exam: There were no vitals filed for this visit. Physical Exam Constitutional:      Appearance: Normal appearance.  HENT:     Head: Normocephalic and atraumatic.  Eyes:     Pupils: Pupils are equal, round, and reactive to light.  Neck:     Musculoskeletal: Normal range of motion.  Cardiovascular:     Rate and Rhythm: Normal rate and regular rhythm.     Heart sounds: Normal heart sounds. No murmur.  Pulmonary:     Effort: Pulmonary effort is normal.     Breath sounds: Normal breath sounds. No wheezing.  Abdominal:     General: Bowel sounds are normal. There is no distension.     Palpations: Abdomen is soft.     Tenderness: There is no abdominal tenderness.  Musculoskeletal: Normal range of  motion.  Skin:    General: Skin is warm and dry.     Findings: No rash.  Neurological:     Mental Status: She is alert and oriented to person, place, and time.  Psychiatric:        Judgment: Judgment normal.      CMP Latest Ref Rng & Units 01/05/2019  Glucose 70 - 99 mg/dL 101(H)  BUN 6 - 20 mg/dL 9  Creatinine 0.44 - 1.00 mg/dL 0.63  Sodium 135 - 145 mmol/L 138  Potassium 3.5 - 5.1 mmol/L 3.6  Chloride 98 - 111 mmol/L 103  CO2 22 - 32 mmol/L 25  Calcium 8.9 - 10.3 mg/dL 9.8  Total Protein 6.5 - 8.1 g/dL 7.7  Total Bilirubin 0.3 - 1.2 mg/dL 0.4  Alkaline Phos 38 - 126 U/L 95  AST 15 - 41 U/L 18  ALT 0 - 44 U/L 15   CBC Latest Ref Rng & Units 01/05/2019  WBC 4.0 - 10.5 K/uL 9.1  Hemoglobin 12.0 - 15.0 g/dL 13.7  Hematocrit 36.0 - 46.0 % 40.4  Platelets 150 - 400 K/uL 315    No images are attached to the encounter.  Nm Sentinel Node Injection  Result Date: 01/14/2019 CLINICAL DATA:  Right breast cancer. EXAM: NUCLEAR MEDICINE BREAST LYMPHOSCINTIGRAPHY TECHNIQUE: Intradermal injection of radiopharmaceutical was performed at the 12 o'clock, 3 o'clock, 6 o'clock, and 9 o'clock positions around the right nipple. The patient was then sent to the operating room where the sentinel node(s) were identified and removed by the surgeon. RADIOPHARMACEUTICALS:  Total of 1 mCi Millipore-filtered Technetium-62msulfur colloid, injected in four aliquots of 0.25 mCi each. IMPRESSION: Uncomplicated intradermal injection of a total of 1 mCi Technetium-952mulfur colloid for purposes of sentinel node identification. Electronically Signed   By: MaInez Catalina.D.   On: 01/14/2019 09:11   Nm Sentinel Node Injection  Result Date: 01/14/2019 CLINICAL DATA:  Left breast cancer. EXAM: NUCLEAR MEDICINE BREAST LYMPHOSCINTIGRAPHY TECHNIQUE: Intradermal injection of radiopharmaceutical was performed at the 12 o'clock, 3 o'clock, 6 o'clock, and 9 o'clock positions around the left nipple. The patient was then  sent to the operating room where the sentinel node(s) were identified and removed by the surgeon. RADIOPHARMACEUTICALS:  Total of 1 mCi Millipore-filtered Technetium-9919mlfur colloid, injected in four aliquots of 0.25 mCi each. IMPRESSION: Uncomplicated intradermal injection of a total of 1 mCi Technetium-[id number] colloid for purposes of sentinel node identification. Electronically Signed   By: MarkInez Catalina.   On: 01/14/2019 09:11   Assessment and plan- Patient is a 53 y52. female [facility] in preparation for starting chemotherapy for the treatment of breast cancer.    1. Breast Cancer: Patient reports feeling mass of her right breast week before her annual mammogram. 12/03/2018 patient had bilateral diagnostic mammogram and ultrasound  which showed suspicious 2.1 x 1.3 x 1.6 irregular mass at 7:00 in the right breast, 2 cm from the nipple. 2 borderline lymph nodes are seen in the right axilla. Patient underwent ultrasound-guided biopsy of right breast mass as well as ultrasound-guided biopsy of 1 of the 2 mildly abnormal right axillary lymph nodes. 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 Right axilla lymph node negative for malignancy. Mother diagnosed with breast cancer at age of 53, 59d colon cancer at age of 68. 56other has BRCA2 mutation. Maternal grandmother breast cancer, maternal great aunt breast cancer.  Patient recalls that she was tested long time ago and was not aware of any results. BRCA 2 positive.  # 01/14/2019 patient underwent bilateral mastectomy with sentinel lymph node biopsy of left and the right axillary. Right breast  showed invasive mammary carcinoma, DCIS positive, apocrine metaplastic, stromal fibrosis, duct ectasia, simple cyst formation, usual epithelial hyperplasia.  Sentinel lymph node on the right side was negative. pT2 pN0 Left prophylactic  mastectomy and a sentinel lymph node negative for malignancy.  2. Chemo Care Clinic/High Risk for ER/Hospitalization during chemotherapy- We discussed the role of the chemo care clinic and identified patient specific risk factors. I discussed that patient was identified as high risk primarily based on co morbidities and type of cancer with BRCA +.  Patient lives at home with her husband.  Her PCP is Dr. Army Melia and she is covered by [facility].  Her past medical history is positive for: Past Medical History:  Diagnosis Date   Benign neoplasm of sigmoid colon    Benign neoplasm of transverse colon    Breast cancer ([place])    Family history of BRCA2 gene positive    Family history of breast cancer    Family history of colon cancer    Family history of lung cancer    Family history of pancreatic cancer    GERD (gastroesophageal reflux disease)    History of kidney stones    Hypertension    Malignant neoplasm of lower-outer quadrant of right breast of female, estrogen receptor positive ([place]) 12/17/2018   Smoker    Her past surgical history is positive for: Past Surgical History:  Procedure Laterality Date   AXILLARY SENTINEL NODE BIOPSY Bilateral 01/14/2019   Procedure: AXILLARY SENTINEL NODE BIOPSY;  Surgeon: Herbert Pun, MD;  Location: [facility];  Service: General;  Laterality: Bilateral;   BREAST BIOPSY Right 12/08/2018   [place] bx of mass with calcs path pending   BREAST BIOPSY Right 12/08/2018   [place] bx of LN, path pending   BREAST RECONSTRUCTION WITH PLACEMENT OF TISSUE EXPANDER AND ALLODERM Bilateral 01/14/2019   Procedure: BILATERAL BREAST RECONSTRUCTION WITH PLACEMENT OF TISSUE EXPANDER AND FLEX HD;  Surgeon: Wallace Going, DO;  Location: [facility];  Service: Plastics;  Laterality: Bilateral;   COLONOSCOPY WITH PROPOFOL N/A 02/17/2015   Procedure: COLONOSCOPY WITH PROPOFOL;  Surgeon: Lucilla Lame, MD;  Location: [place];  Service: Endoscopy;  Laterality: N/A;   POLYPECTOMY  02/17/2015   Procedure: POLYPECTOMY;  Surgeon: Lucilla Lame, MD;  Location: [place];  Service: Endoscopy;;   SIMPLE MASTECTOMY WITH AXILLARY SENTINEL NODE BIOPSY Left 01/14/2019   Procedure: LEFT SIMPLE MASTECTOMY;  Surgeon: Herbert Pun, MD;  Location: [facility];  Service: General;  Laterality: Left;   TOTAL MASTECTOMY Right 01/14/2019   Procedure: RIGHT TOTAL MASTECTOMY;  Surgeon: Herbert Pun, MD;  Location: [facility];  Service: General;  Laterality: Right;   WISDOM TOOTH EXTRACTION     3. Social Determinants of Health- we discussed that social determinants of health may have significant impacts on health and outcomes for cancer patients.  Today we discussed specific social determinants of performance status, alcohol use, depression, financial needs, food insecurity, housing, interpersonal violence, social connections, stress, tobacco use, and transportation.  After lengthy discussion the following we did not identify any areas of disease. We discussed that living with cancer can create tremendous financial burden.  We discussed options for assistance. I asked that if assistance is needed in affording medications or paying bills to please let us know so that we can provide assistance. We discussed options for food including social services.  We will also notify [facility] to see if cancer center can provide support.  Patient informed of  food pantry at cancer center and was provided with care package today.  Please notify nursing if un-met needs. We discussed referral to social work. Will also discuss with Elease Etienne to see if [facility] can provide support of utility bills, etc. We discussed options for support groups at the cancer center. If interested, please notify nurse navigator to enroll. We discussed options for managing stress including healthy eating, exercise as well as participating in no charge  counseling services at the cancer center and support groups.  If these are of interest, patient can notify either myself or primary nursing team.  4. Co-morbidities Complicating Care: Past Medical History:  Diagnosis Date   Benign neoplasm of sigmoid colon    Benign neoplasm of transverse colon    Breast cancer ([place])    Family history of BRCA2 gene positive    Family history of breast cancer    Family history of colon cancer    Family history of lung cancer    Family history of pancreatic cancer    GERD (gastroesophageal reflux disease)    History of kidney stones    Hypertension    Malignant neoplasm of lower-outer quadrant of right breast of female, estrogen receptor positive ([place]) 12/17/2018   Smoker    5. We also discussed the role of the Symptom Management Clinic at [facility] for acute issues and methods of contacting clinic/provider. She denies needing specific assistance at this time and She will be followed by Tanya Nones, RN (Nurse Navigator).   6. Recently started on gabapentin 300 mg at bedtime for nerve pain from bilateral mastectomy.  States this appears to be helping well.  7. Patient had questions regarding receiving the shingles and pneumonia vaccine.  Spoke with Dr. Tasia Catchings who states both are ok. Patient made aware.   Visit Diagnosis 1. Malignant neoplasm of lower-outer quadrant of right breast of female, estrogen receptor positive ([place])    Patient expressed understanding and was in agreement with this plan. She also understands that She can call clinic at any time with any questions, concerns, or complaints.   A total of (25) minutes of face-to-face time was spent with this patient with greater than 50% of that time in counseling and care-coordination.  Rulon Abide, NP, AGNP-C [place] at [facility] [phone] (office)  CC: Dr. Tasia Catchings

## 2019-02-11 NOTE — Patient Instructions (Addendum)
Your procedure is scheduled on: Monday 02/15/19.  Report to DAY SURGERY DEPARTMENT LOCATED ON 2ND FLOOR MEDICAL MALL ENTRANCE. To find out your arrival time please call [phone] between 1PM - 3PM on Friday 02/12/19.   Remember: Instructions that are not followed completely may result in serious medical risk, up to and including death, or upon the discretion of your surgeon and anesthesiologist your surgery may need to be rescheduled.      _X__ 1. Do not eat food after midnight the night before your procedure.                 No gum chewing or hard candies. You may drink clear liquids up to 2 hours                 before you are scheduled to arrive for your surgery- DO NOT drink clear                 liquids within 2 hours of the start of your surgery.                 Clear Liquids include:  water, apple juice without pulp, clear carbohydrate                 drink such as Clearfast or Gatorade, Black Coffee or Tea (Do not add                 milk or creamer to coffee or tea).    __X__2.  On the morning of surgery brush your teeth with toothpaste and water, you may rinse your mouth with mouthwash if you wish.  Do not swallow any toothpaste or mouthwash.      __X__3.  Notify your doctor if there is any change in your medical condition      (cold, fever, infections).       Do not wear jewelry, make-up, hairpins, clips or nail polish. Do not wear lotions, powders, or perfumes.  Do not shave 48 hours prior to surgery. Men may shave face and neck. Do not bring valuables to the hospital.      [facility] is not responsible for any belongings or valuables.    Contacts, dentures/partials or body piercings may not be worn into surgery. Bring a case for your contacts, glasses or hearing aids, a denture cup will be supplied.     Patients discharged the day of surgery will not be allowed to drive home.     __X__ Take these medicines the morning of surgery with A SIP OF WATER:      1. cetirizine (ZYRTEC) 10 MG tablet  2. omeprazole (PRILOSEC) 40 MG capsule  3. gabapentin (NEURONTIN) 300 MG capsule  4. diazepam (VALIUM) 2 MG tablet if needed      __X__ Use CHG Soap as directed    __X__ Stop Anti-inflammatories 7 days before surgery such as Advil, Ibuprofen, Motrin, BC or Goodies Powder, Naprosyn, Naproxen, Aleve, Aspirin, Meloxicam. May take Tylenol if needed for pain or discomfort.     __X__ Don't begin taking any new herbal supplements before your procedure.

## 2019-02-12 ENCOUNTER — Other Ambulatory Visit
Admission: RE | Admit: 2019-02-12 | Discharge: 2019-02-12 | Disposition: A | Discharge status: Home / Self Care | Payer: BC Managed Care – PPO | Source: Ambulatory Visit | Attending: General Surgery | Admitting: General Surgery

## 2019-02-12 DIAGNOSIS — Z20828 Contact with and (suspected) exposure to other viral communicable diseases: Secondary | ICD-10-CM | POA: Insufficient documentation

## 2019-02-12 DIAGNOSIS — Z01812 Encounter for preprocedural laboratory examination: Secondary | ICD-10-CM | POA: Insufficient documentation

## 2019-02-12 LAB — SARS CORONAVIRUS 2 (TAT 6-24 HRS): SARS Coronavirus 2: NEGATIVE

## 2019-02-14 MED ORDER — CEFAZOLIN SODIUM-DEXTROSE 2-4 GM/100ML-% IV SOLN: 2.0000 g | INTRAVENOUS | Status: DC

## 2019-02-15 ENCOUNTER — Other Ambulatory Visit: Payer: Self-pay

## 2019-02-15 ENCOUNTER — Other Ambulatory Visit: Admission: RE | Admit: 2019-02-15 | Payer: BC Managed Care – PPO | Source: Ambulatory Visit

## 2019-02-15 ENCOUNTER — Encounter
Admission: RE | Disposition: A | Discharge status: Home / Self Care | Payer: Self-pay | Source: Ambulatory Visit | Attending: General Surgery

## 2019-02-15 ENCOUNTER — Ambulatory Visit: Payer: BC Managed Care – PPO

## 2019-02-15 ENCOUNTER — Other Ambulatory Visit: Payer: BC Managed Care – PPO

## 2019-02-15 ENCOUNTER — Ambulatory Visit: Payer: BC Managed Care – PPO | Admitting: Anesthesiology

## 2019-02-15 ENCOUNTER — Ambulatory Visit: Payer: BC Managed Care – PPO | Admitting: Oncology

## 2019-02-15 ENCOUNTER — Ambulatory Visit
Admission: RE | Admit: 2019-02-15 | Discharge: 2019-02-15 | Disposition: A | Discharge status: Home / Self Care | Payer: BC Managed Care – PPO | Source: Ambulatory Visit | Attending: General Surgery | Admitting: General Surgery

## 2019-02-15 ENCOUNTER — Encounter: Payer: Self-pay | Admitting: *Deleted

## 2019-02-15 DIAGNOSIS — C50911 Malignant neoplasm of unspecified site of right female breast: Secondary | ICD-10-CM | POA: Insufficient documentation

## 2019-02-15 DIAGNOSIS — N390 Urinary tract infection, site not specified: Secondary | ICD-10-CM | POA: Insufficient documentation

## 2019-02-15 DIAGNOSIS — Z5309 Procedure and treatment not carried out because of other contraindication: Secondary | ICD-10-CM | POA: Insufficient documentation

## 2019-02-15 LAB — URINALYSIS, ROUTINE W REFLEX MICROSCOPIC
Bilirubin Urine: NEGATIVE
Glucose, UA: NEGATIVE mg/dL
Ketones, ur: NEGATIVE mg/dL
Nitrite: NEGATIVE
Protein, ur: 100 mg/dL — AB
Specific Gravity, Urine: 1.005 (ref 1.005–1.030)
WBC, UA: >50 WBC/hpf — ABNORMAL HIGH (ref 0–5)
pH: 6 (ref 5.0–8.0)

## 2019-02-15 SURGERY — INSERTION, TUNNELED CENTRAL VENOUS DEVICE, WITH PORT
Anesthesia: General | Site: Chest

## 2019-02-15 MED ORDER — CEFAZOLIN SODIUM-DEXTROSE 2-4 GM/100ML-% IV SOLN
INTRAVENOUS | Status: AC
  Filled 2019-02-15: qty 100

## 2019-02-15 MED ORDER — SUCCINYLCHOLINE CHLORIDE 20 MG/ML IJ SOLN
INTRAMUSCULAR | Status: AC
  Filled 2019-02-15: qty 1

## 2019-02-15 MED ORDER — LACTATED RINGERS IV SOLN
INTRAVENOUS | Status: DC
  Administered 2019-02-15: 08:00:00 via INTRAVENOUS

## 2019-02-15 MED ORDER — PROPOFOL 500 MG/50ML IV EMUL
INTRAVENOUS | Status: AC
  Filled 2019-02-15: qty 50

## 2019-02-15 MED ORDER — SODIUM CHLORIDE (PF) 0.9 % IJ SOLN
INTRAMUSCULAR | Status: AC
  Filled 2019-02-15: qty 50

## 2019-02-15 MED ORDER — HEPARIN SODIUM (PORCINE) 5000 UNIT/ML IJ SOLN
INTRAMUSCULAR | Status: AC
  Filled 2019-02-15: qty 1

## 2019-02-15 MED ORDER — EPINEPHRINE PF 1 MG/ML IJ SOLN
INTRAMUSCULAR | Status: AC
  Filled 2019-02-15: qty 1

## 2019-02-15 MED ORDER — ROCURONIUM BROMIDE 50 MG/5ML IV SOLN
INTRAVENOUS | Status: AC
  Filled 2019-02-15: qty 1

## 2019-02-15 MED ORDER — MIDAZOLAM HCL 2 MG/2ML IJ SOLN
INTRAMUSCULAR | Status: AC
  Filled 2019-02-15: qty 2

## 2019-02-15 MED ORDER — FENTANYL CITRATE (PF) 100 MCG/2ML IJ SOLN
INTRAMUSCULAR | Status: AC
  Filled 2019-02-15: qty 2

## 2019-02-15 MED ORDER — LIDOCAINE HCL (PF) 2 % IJ SOLN
INTRAMUSCULAR | Status: AC
  Filled 2019-02-15: qty 10

## 2019-02-15 MED ORDER — BUPIVACAINE HCL (PF) 0.25 % IJ SOLN
INTRAMUSCULAR | Status: AC
  Filled 2019-02-15: qty 30

## 2019-02-15 SURGICAL SUPPLY — 28 items
BLADE SURG SZ11 CARB STEEL (BLADE) ×2 IMPLANT
CANISTER SUCT 1200ML W/VALVE (MISCELLANEOUS) ×2 IMPLANT
CHLORAPREP W/TINT 26 (MISCELLANEOUS) ×2 IMPLANT
COVER LIGHT HANDLE STERIS (MISCELLANEOUS) ×4 IMPLANT
COVER WAND RF STERILE (DRAPES) ×2 IMPLANT
DERMABOND ADVANCED (GAUZE/BANDAGES/DRESSINGS) ×1
DERMABOND ADVANCED .7 DNX12 (GAUZE/BANDAGES/DRESSINGS) ×1 IMPLANT
DRAPE C-ARM XRAY 36X54 (DRAPES) ×2 IMPLANT
ELECT REM PT RETURN 9FT ADLT (ELECTROSURGICAL) ×2
ELECTRODE REM PT RTRN 9FT ADLT (ELECTROSURGICAL) ×1 IMPLANT
GLOVE BIO SURGEON STRL SZ 6.5 (GLOVE) ×2 IMPLANT
GLOVE BIOGEL PI IND STRL 6.5 (GLOVE) ×1 IMPLANT
GLOVE BIOGEL PI INDICATOR 6.5 (GLOVE) ×1
GOWN STRL REUS W/ TWL LRG LVL3 (GOWN DISPOSABLE) ×3 IMPLANT
GOWN STRL REUS W/TWL LRG LVL3 (GOWN DISPOSABLE) ×3
KIT TURNOVER KIT A (KITS) ×2 IMPLANT
LABEL OR SOLS (LABEL) ×2 IMPLANT
NEEDLE FILTER BLUNT 18X 1/2SAF (NEEDLE) ×1
NEEDLE FILTER BLUNT 18X1 1/2 (NEEDLE) ×1 IMPLANT
PACK PORT-A-CATH (MISCELLANEOUS) ×2 IMPLANT
SUT MNCRL AB 4-0 PS2 18 (SUTURE) ×2 IMPLANT
SUT PROLENE 2 0 FS (SUTURE) ×2 IMPLANT
SUT VIC AB 2-0 SH 27 (SUTURE) ×1
SUT VIC AB 2-0 SH 27XBRD (SUTURE) ×1 IMPLANT
SUT VIC AB 3-0 SH 27 (SUTURE) ×1
SUT VIC AB 3-0 SH 27X BRD (SUTURE) ×1 IMPLANT
SYR 10ML LL (SYRINGE) ×2 IMPLANT
SYR 3ML LL SCALE MARK (SYRINGE) ×2 IMPLANT

## 2019-02-15 NOTE — Anesthesia Preprocedure Evaluation (Deleted)
Anesthesia Evaluation  Patient identified by MRN, date of birth, ID band Patient awake    Reviewed: Allergy & Precautions, H&P , NPO status , Patient's Chart, lab work & pertinent test results, reviewed documented beta blocker date and time   History of Anesthesia Complications Negative for: history of anesthetic complications  Airway Mallampati: II  TM Distance: <3 FB Neck ROM: limited    Dental  (+) Chipped   Pulmonary neg shortness of breath, Current Smoker and Patient abstained from smoking.,    Pulmonary exam normal        Cardiovascular Exercise Tolerance: Good hypertension, On Medications (-) angina(-) Past MI and (-) DOE Normal cardiovascular exam Rhythm:regular Rate:Normal     Neuro/Psych negative neurological ROS  negative psych ROS   GI/Hepatic Neg liver ROS, GERD  Medicated and Controlled,  Endo/Other  negative endocrine ROS  Renal/GU negative Renal ROS  negative genitourinary   Musculoskeletal   Abdominal   Peds  Hematology negative hematology ROS (+)   Anesthesia Other Findings Past Medical History: No date: Benign neoplasm of sigmoid colon No date: Benign neoplasm of transverse colon No date: Breast cancer (HCC) No date: Family history of BRCA2 gene positive No date: Family history of breast cancer No date: Family history of colon cancer No date: Family history of lung cancer No date: Family history of pancreatic cancer No date: GERD (gastroesophageal reflux disease) No date: History of kidney stones No date: Hypertension 12/17/2018: Malignant neoplasm of lower-outer quadrant of right breast  of female, estrogen receptor positive ([place]) No date: Smoker Past Surgical History: 12/08/2018: BREAST BIOPSY; Right     Comment:  [place] bx of mass with calcs path pending 12/08/2018: BREAST BIOPSY; Right     Comment:  [place] bx of LN, path pending 02/17/2015: COLONOSCOPY WITH PROPOFOL; N/A     Comment:   Procedure: COLONOSCOPY WITH PROPOFOL;  Surgeon: Lucilla Lame, MD;  Location: [facility];  Service:               Endoscopy;  Laterality: N/A; 02/17/2015: POLYPECTOMY     Comment:  Procedure: POLYPECTOMY;  Surgeon: Lucilla Lame, MD;                Location: [place];  Service: Endoscopy;; No date: WISDOM TOOTH EXTRACTION BMI    Body Mass Index: 26.46 kg/m     Reproductive/Obstetrics negative OB ROS                             Anesthesia Physical  Anesthesia Plan  ASA: III  Anesthesia Plan:    Post-op Pain Management:    Induction: Intravenous  PONV Risk Score and Plan: 3 and Propofol infusion and TIVA  Airway Management Planned: Natural Airway and Nasal Cannula  Additional Equipment:   Intra-op Plan:   Post-operative Plan:   Informed Consent: I have reviewed the patients History and Physical, chart, labs and discussed the procedure including the risks, benefits and alternatives for the proposed anesthesia with the patient or authorized representative who has indicated his/her understanding and acceptance.     Dental Advisory Given  Plan Discussed with: CRNA  Anesthesia Plan Comments: (Patient consented for risks of anesthesia including but not limited to:  - adverse reactions to medications - risk of intubation if required - damage to teeth, lips or other oral mucosa - sore throat or hoarseness -  Damage to heart, brain, lungs or loss of life  Patient voiced understanding.)       Anesthesia Quick Evaluation

## 2019-02-15 NOTE — H&P (Signed)
Patient came today for insertion of Port-A-Cath.  Patient complaining of burning in urination frequency.  Urinalysis was done showing many bacteria, many leukocytes.  This was consistent with symptomatic urinary tract infection.  Since the procedure consider putting a catheter in the vascular system, being an elective case will hold until infection is resolved.  Patient oriented about the plan and she agreed.  Culture in process.  Patient started on antibiotic therapy.

## 2019-02-15 NOTE — Pre-Procedure Instructions (Signed)
Contacted patient to learn if she was arriving for covid testing today.  Patient states that her surgery was cancelled for today and rescheduled for 02/18/19.  If she can remain quarantined until then, she was told by her MD that she would not need to be reswabbed.  Patient assured me that she was at home and was not going anywhere between now and then!!

## 2019-02-15 NOTE — Progress Notes (Signed)
Pt reported urinary tract infection symptoms to MD, reports she was given RX but with minimal improvement. Dr. Peyton Najjar ordered UA and noted results, reports to reschedule patient for Thursday November 12, due to current infection. Pt verbalized understanding. MD ordered additional culture and informed patient to call if no improvement.

## 2019-02-15 NOTE — H&P (View-Only) (Signed)
Patient came today for insertion of Port-A-Cath.  Patient complaining of burning in urination frequency.  Urinalysis was done showing many bacteria, many leukocytes.  This was consistent with symptomatic urinary tract infection.  Since the procedure consider putting a catheter in the vascular system, being an elective case will hold until infection is resolved.  Patient oriented about the plan and she agreed.  Culture in process.  Patient started on antibiotic therapy.

## 2019-02-17 ENCOUNTER — Encounter: Payer: Self-pay | Admitting: Internal Medicine

## 2019-02-17 ENCOUNTER — Other Ambulatory Visit: Payer: Self-pay | Admitting: Internal Medicine

## 2019-02-17 DIAGNOSIS — N3000 Acute cystitis without hematuria: Secondary | ICD-10-CM

## 2019-02-17 MED ORDER — CIPROFLOXACIN HCL 250 MG PO TABS: 250.0000 mg | ORAL_TABLET | Freq: Two times a day (BID) | ORAL | 0 refills | Status: AC

## 2019-02-17 NOTE — Telephone Encounter (Signed)
Patient requesting abx for UTI. Please advise previous msgs.

## 2019-02-18 ENCOUNTER — Ambulatory Visit: Payer: BC Managed Care – PPO | Admitting: Anesthesiology

## 2019-02-18 ENCOUNTER — Encounter
Admission: RE | Disposition: A | Discharge status: Home / Self Care | Payer: Self-pay | Source: Home / Self Care | Attending: General Surgery

## 2019-02-18 ENCOUNTER — Ambulatory Visit: Payer: BC Managed Care – PPO

## 2019-02-18 ENCOUNTER — Encounter: Payer: Self-pay | Admitting: Emergency Medicine

## 2019-02-18 ENCOUNTER — Ambulatory Visit
Admission: RE | Admit: 2019-02-18 | Discharge: 2019-02-18 | Disposition: A | Discharge status: Home / Self Care | Payer: BC Managed Care – PPO | Attending: General Surgery | Admitting: General Surgery

## 2019-02-18 ENCOUNTER — Other Ambulatory Visit: Payer: Self-pay

## 2019-02-18 DIAGNOSIS — C50911 Malignant neoplasm of unspecified site of right female breast: Secondary | ICD-10-CM | POA: Insufficient documentation

## 2019-02-18 DIAGNOSIS — Z95828 Presence of other vascular implants and grafts: Secondary | ICD-10-CM

## 2019-02-18 HISTORY — PX: PORTACATH PLACEMENT: SHX2246

## 2019-02-18 LAB — URINE CULTURE: Culture: >=100000 — AB

## 2019-02-18 SURGERY — INSERTION, TUNNELED CENTRAL VENOUS DEVICE, WITH PORT
Anesthesia: General | Laterality: Left

## 2019-02-18 MED ORDER — HYDROCODONE-ACETAMINOPHEN 5-325 MG PO TABS: 1.0000 | ORAL_TABLET | ORAL | 0 refills | Status: AC | PRN

## 2019-02-18 MED ORDER — PROPOFOL 500 MG/50ML IV EMUL
INTRAVENOUS | Status: DC | PRN
  Administered 2019-02-18: 175 ug/kg/min via INTRAVENOUS

## 2019-02-18 MED ORDER — LACTATED RINGERS IV SOLN
INTRAVENOUS | Status: DC
  Administered 2019-02-18: 11:00:00 via INTRAVENOUS

## 2019-02-18 MED ORDER — LACTATED RINGERS IV SOLN: INTRAVENOUS | Status: DC | PRN

## 2019-02-18 MED ORDER — SODIUM CHLORIDE 0.9 % IV SOLN
INTRAVENOUS | Status: DC | PRN
  Administered 2019-02-18: 7 mL via INTRAMUSCULAR

## 2019-02-18 MED ORDER — FENTANYL CITRATE (PF) 100 MCG/2ML IJ SOLN
INTRAMUSCULAR | Status: AC
  Filled 2019-02-18: qty 2

## 2019-02-18 MED ORDER — PROPOFOL 500 MG/50ML IV EMUL
INTRAVENOUS | Status: AC
  Filled 2019-02-18: qty 50

## 2019-02-18 MED ORDER — FENTANYL CITRATE (PF) 100 MCG/2ML IJ SOLN: 25.0000 ug | INTRAMUSCULAR | Status: DC | PRN

## 2019-02-18 MED ORDER — CEFAZOLIN SODIUM-DEXTROSE 2-3 GM-%(50ML) IV SOLR
INTRAVENOUS | Status: DC | PRN
  Administered 2019-02-18: 2 g via INTRAVENOUS

## 2019-02-18 MED ORDER — PROMETHAZINE HCL 25 MG/ML IJ SOLN: 6.2500 mg | INTRAMUSCULAR | Status: DC | PRN

## 2019-02-18 MED ORDER — MIDAZOLAM HCL 2 MG/2ML IJ SOLN
INTRAMUSCULAR | Status: AC
  Filled 2019-02-18: qty 2

## 2019-02-18 MED ORDER — ACETAMINOPHEN 160 MG/5ML PO SOLN
325.0000 mg | ORAL | Status: DC | PRN
  Filled 2019-02-18: qty 20.3

## 2019-02-18 MED ORDER — ACETAMINOPHEN 325 MG PO TABS: 325.0000 mg | ORAL_TABLET | ORAL | Status: DC | PRN

## 2019-02-18 MED ORDER — PROPOFOL 10 MG/ML IV BOLUS
INTRAVENOUS | Status: DC | PRN
  Administered 2019-02-18: 40 mg via INTRAVENOUS

## 2019-02-18 MED ORDER — CEFAZOLIN SODIUM-DEXTROSE 2-4 GM/100ML-% IV SOLN
INTRAVENOUS | Status: AC
  Filled 2019-02-18: qty 100

## 2019-02-18 MED ORDER — HYDROCODONE-ACETAMINOPHEN 7.5-325 MG PO TABS
1.0000 | ORAL_TABLET | Freq: Once | ORAL | Status: DC | PRN
  Filled 2019-02-18: qty 1

## 2019-02-18 MED ORDER — BUPIVACAINE-EPINEPHRINE (PF) 0.25% -1:200000 IJ SOLN
INTRAMUSCULAR | Status: DC | PRN
  Administered 2019-02-18: 9 mL

## 2019-02-18 MED ORDER — MIDAZOLAM HCL 2 MG/2ML IJ SOLN
INTRAMUSCULAR | Status: DC | PRN
  Administered 2019-02-18: 2 mg via INTRAVENOUS

## 2019-02-18 MED ORDER — FENTANYL CITRATE (PF) 100 MCG/2ML IJ SOLN
INTRAMUSCULAR | Status: DC | PRN
  Administered 2019-02-18 (×4): 25 ug via INTRAVENOUS

## 2019-02-18 MED ORDER — LIDOCAINE HCL (PF) 2 % IJ SOLN
INTRAMUSCULAR | Status: AC
  Filled 2019-02-18: qty 10

## 2019-02-18 MED ORDER — LIDOCAINE HCL (CARDIAC) PF 100 MG/5ML IV SOSY
PREFILLED_SYRINGE | INTRAVENOUS | Status: DC | PRN
  Administered 2019-02-18: 80 mg via INTRAVENOUS

## 2019-02-18 MED ORDER — MEPERIDINE HCL 50 MG/ML IJ SOLN: 6.2500 mg | INTRAMUSCULAR | Status: DC | PRN

## 2019-02-18 SURGICAL SUPPLY — 29 items
BLADE SURG SZ11 CARB STEEL (BLADE) ×2 IMPLANT
CANISTER SUCT 1200ML W/VALVE (MISCELLANEOUS) ×2 IMPLANT
CHLORAPREP W/TINT 26 (MISCELLANEOUS) ×2 IMPLANT
COVER LIGHT HANDLE STERIS (MISCELLANEOUS) ×4 IMPLANT
COVER WAND RF STERILE (DRAPES) ×2 IMPLANT
DERMABOND ADVANCED (GAUZE/BANDAGES/DRESSINGS) ×1
DERMABOND ADVANCED .7 DNX12 (GAUZE/BANDAGES/DRESSINGS) ×1 IMPLANT
DRAPE C-ARM XRAY 36X54 (DRAPES) ×2 IMPLANT
ELECT REM PT RETURN 9FT ADLT (ELECTROSURGICAL) ×2
ELECTRODE REM PT RTRN 9FT ADLT (ELECTROSURGICAL) ×1 IMPLANT
GLOVE BIO SURGEON STRL SZ 6.5 (GLOVE) ×2 IMPLANT
GLOVE BIOGEL PI IND STRL 6.5 (GLOVE) ×1 IMPLANT
GLOVE BIOGEL PI INDICATOR 6.5 (GLOVE) ×1
GOWN STRL REUS W/ TWL LRG LVL3 (GOWN DISPOSABLE) ×3 IMPLANT
GOWN STRL REUS W/TWL LRG LVL3 (GOWN DISPOSABLE) ×3
KIT PORT POWER 8FR ISP CVUE (Port) ×2 IMPLANT
KIT TURNOVER KIT A (KITS) ×2 IMPLANT
LABEL OR SOLS (LABEL) ×2 IMPLANT
NEEDLE FILTER BLUNT 18X 1/2SAF (NEEDLE) ×1
NEEDLE FILTER BLUNT 18X1 1/2 (NEEDLE) ×1 IMPLANT
PACK PORT-A-CATH (MISCELLANEOUS) ×2 IMPLANT
SUT MNCRL AB 4-0 PS2 18 (SUTURE) ×2 IMPLANT
SUT PROLENE 2 0 FS (SUTURE) ×2 IMPLANT
SUT VIC AB 2-0 SH 27 (SUTURE) ×1
SUT VIC AB 2-0 SH 27XBRD (SUTURE) ×1 IMPLANT
SUT VIC AB 3-0 SH 27 (SUTURE) ×1
SUT VIC AB 3-0 SH 27X BRD (SUTURE) ×1 IMPLANT
SYR 10ML LL (SYRINGE) ×2 IMPLANT
SYR 3ML LL SCALE MARK (SYRINGE) ×2 IMPLANT

## 2019-02-18 NOTE — Op Note (Signed)
SURGICAL PROCEDURE REPORT  DATE OF PROCEDURE: 02/18/2019   SURGEON: Dr. Windell Moment   ANESTHESIA: Local with light IV sedation   PRE-OPERATIVE DIAGNOSIS: Advanced breast cancer requiring durable central venous access for chemotherapy   POST-OPERATIVE DIAGNOSIS: Advanced breast cancer requiring durable central venous access for chemotherapy  PROCEDURE(S):  1.) Percutaneous access of Left internal jugular vein under ultrasound guidance 2.) Insertion of tunneled Left internal jugular central venous catheter with subcutaneous port  INTRAOPERATIVE FINDINGS: Patent easily compressible Left internal jugular vein with appropriate respiratory variations and well-secured tunneled central venous catheter with subcutaneous port at completion of the procedure  ESTIMATED BLOOD LOSS: Minimal (<20 mL)   SPECIMENS: None   IMPLANTS: 15F tunneled Bard PowerPort central venous catheter with subcutaneous port  DRAINS: None   COMPLICATIONS: None apparent   CONDITION AT COMPLETION: Hemodynamically stable, awake   DISPOSITION: PACU   INDICATION(S) FOR PROCEDURE:  Patient is a 53 y.o. female who presented with advanced breast cancer requiring durable central venous access for chemotherapy. All risks, benefits, and alternatives to above elective procedures were discussed with the patient, who elected to proceed, and informed consent was accordingly obtained at that time.  DETAILS OF PROCEDURE:  Patient was brought to the operative suite and appropriately identified. In Trendelenburg position, Left IJ venous access site was prepped and draped in the usual sterile fashion, and following a brief timeout, percutaneous Left IJ venous access was obtained under ultrasound guidance using Seldinger technique, by which local anesthetic was injected over the Left IJ vein, and access needle was inserted under direct ultrasound visualization into the Left IJ vein, through which soft guidewire was advanced, over  which access needle was withdrawn. Guidewire was secured, attention was directed to injection of local anesthetic along the planned tunnel site, 2-3 cm transverse Left chest incision was made and confirmed to accommodate the subcutaneous port, and flushed catheter was tunneled retrograde from the port site over the Left chest to the Left IJ access site with the attached port well-secured to the catheter and within the subcutaneous pocket. Insertion sheath was advanced over the guidewire, which was withdrawn along with the insertion sheath dilator. The catheter was introduced through the sheath and left on the [place] junction under fluoro guidance and catheter cut to desire lenght. Catheter connected to port and fixed to the pocket on two side to avoid twisting. Port was confirmed to withdraw blood and flush easily, after which concentrated heparin was instilled into the port and catheter. Dermis at the subcutaneous pocket was re-approximated using buried interrupted 3-0 Vicryl suture, and 4-0 Monocryl suture was used to re-approximate skin at the insertion and subcutaneous port sites in running subcuticular fashion for the subcutaneous port and buried interrupted fashion for the insertion site. Skin was cleaned, dried, and sterile skin glue was applied. Patient was then safely transferred to PACU for a chest x-ray. Ultrasound images are available on paper chart and Fluoroscopy guidance images are available in Epic.

## 2019-02-18 NOTE — Interval H&P Note (Signed)
History and Physical Interval Note:  02/18/2019 10:56 AM  Cheryl Mooney  has presented today for surgery, with the diagnosis of C50.911 MALIGNANT NOPLASM OF BREAST W/BRACA 2 GNE MUTATION, RIGHT.  The various methods of treatment have been discussed with the patient and family. After consideration of risks, benefits and other options for treatment, the patient has consented to  Procedure(s): INSERTION PORT-A-CATH (N/A) as a surgical intervention.  The patient's history has been reviewed, patient examined, no change in status, stable for surgery.  I have reviewed the patient's chart and labs.  Questions were answered to the patient's satisfaction.     Herbert Pun

## 2019-02-18 NOTE — Progress Notes (Signed)
Per dr Peyton Najjar can put IV in right hand

## 2019-02-18 NOTE — Discharge Instructions (Signed)
°  Diet: Resume home heart healthy regular diet.   Activity: Increase activity as tolerated. Light activity and walking are encouraged. Do not drive or drink alcohol if taking narcotic pain medications.  Wound care: May shower with soapy water and pat dry (do not rub incisions), but no baths or submerging incision underwater until follow-up. (no swimming)   Medications: Resume all home medications except . For mild to moderate pain: acetaminophen (Tylenol) or ibuprofen (if no kidney disease). Combining Tylenol with alcohol can substantially increase your risk of causing liver disease. Narcotic pain medications, if prescribed, can be used for severe pain, though may cause nausea, constipation, and drowsiness. Do not combine Tylenol and Norco within a 6 hour period as Norco contains Tylenol. If you do not need the narcotic pain medication, you do not need to fill the prescription.  Call office [phone]) at any time if any questions, worsening pain, fevers/chills, bleeding, drainage from incision site, or other concerns.

## 2019-02-18 NOTE — Anesthesia Post-op Follow-up Note (Signed)
Anesthesia QCDR form completed.        

## 2019-02-18 NOTE — Anesthesia Preprocedure Evaluation (Addendum)
Anesthesia Evaluation  Patient identified by MRN, date of birth, ID band Patient awake    Reviewed: Allergy & Precautions, H&P , NPO status , reviewed documented beta blocker date and time   Airway Mallampati: II  TM Distance: >3 FB Neck ROM: full    Dental  (+) Caps, Teeth Intact   Pulmonary Current Smoker and Patient abstained from smoking.,    Pulmonary exam normal        Cardiovascular hypertension, Normal cardiovascular exam     Neuro/Psych    GI/Hepatic GERD  Medicated and Controlled,  Endo/Other    Renal/GU      Musculoskeletal   Abdominal   Peds  Hematology   Anesthesia Other Findings Past Medical History: No date: Benign neoplasm of sigmoid colon No date: Benign neoplasm of transverse colon No date: Breast cancer ([place]) No date: Family history of BRCA2 gene positive No date: Family history of breast cancer No date: Family history of colon cancer No date: Family history of lung cancer No date: Family history of pancreatic cancer No date: GERD (gastroesophageal reflux disease) No date: History of kidney stones No date: Hypertension 12/17/2018: Malignant neoplasm of lower-outer quadrant of right breast  of female, estrogen receptor positive ([place]) No date: Smoker  Past Surgical History: 01/14/2019: AXILLARY SENTINEL NODE BIOPSY; Bilateral     Comment:  Procedure: AXILLARY SENTINEL NODE BIOPSY;  Surgeon:               Herbert Pun, MD;  Location: [facility];  Service:              General;  Laterality: Bilateral; 12/08/2018: BREAST BIOPSY; Right     Comment:  [place] bx of mass with calcs path pending 12/08/2018: BREAST BIOPSY; Right     Comment:  [place] bx of LN, path pending 01/14/2019: BREAST RECONSTRUCTION WITH PLACEMENT OF TISSUE EXPANDER  AND ALLODERM; Bilateral     Comment:  Procedure: BILATERAL BREAST RECONSTRUCTION WITH               PLACEMENT OF TISSUE EXPANDER AND FLEX HD;  Surgeon:                Wallace Going, DO;  Location: [facility];  Service:               Plastics;  Laterality: Bilateral; 02/17/2015: COLONOSCOPY WITH PROPOFOL; N/A     Comment:  Procedure: COLONOSCOPY WITH PROPOFOL;  Surgeon: Lucilla Lame, MD;  Location: [place];  Service:               Endoscopy;  Laterality: N/A; 02/17/2015: POLYPECTOMY     Comment:  Procedure: POLYPECTOMY;  Surgeon: Lucilla Lame, MD;                Location: [place];  Service: Endoscopy;; 01/14/2019: SIMPLE MASTECTOMY WITH AXILLARY SENTINEL NODE BIOPSY; Left     Comment:  Procedure: LEFT SIMPLE MASTECTOMY;  Surgeon:               Herbert Pun, MD;  Location: [facility];  Service:              General;  Laterality: Left; 01/14/2019: TOTAL MASTECTOMY; Right     Comment:  Procedure: RIGHT TOTAL MASTECTOMY;  Surgeon:               Herbert Pun, MD;  Location: [facility];  Service:  General;  Laterality: Right; No date: WISDOM TOOTH EXTRACTION  BMI    Body Mass Index: 26.41 kg/m      Reproductive/Obstetrics                            Anesthesia Physical Anesthesia Plan  ASA: II  Anesthesia Plan: General   Post-op Pain Management:    Induction: Intravenous  PONV Risk Score and Plan: Treatment may vary due to age or medical condition and TIVA  Airway Management Planned: Nasal Cannula and Natural Airway  Additional Equipment:   Intra-op Plan:   Post-operative Plan:   Informed Consent: I have reviewed the patients History and Physical, chart, labs and discussed the procedure including the risks, benefits and alternatives for the proposed anesthesia with the patient or authorized representative who has indicated his/her understanding and acceptance.     Dental Advisory Given  Plan Discussed with: CRNA  Anesthesia Plan Comments: (Possible LMA vs [place])       Anesthesia Quick Evaluation

## 2019-02-18 NOTE — Transfer of Care (Signed)
Immediate Anesthesia Transfer of Care Note  Patient: [name]  Procedure(s) Performed: INSERTION PORT-A-CATH (Left )  Patient Location: PACU and Short Stay  Anesthesia Type:General  Level of Consciousness: awake, alert  and oriented  Airway & Oxygen Therapy: Patient Spontanous Breathing and Patient connected to nasal cannula oxygen  Post-op Assessment: Report given to RN and Post -op Vital signs reviewed and stable  Post vital signs: Reviewed and stable  Last Vitals:  Vitals Value Taken Time  BP 114/77 02/18/19 1319  Temp 36.3 C 02/18/19 1319  Pulse 57 02/18/19 1319  Resp 16 02/18/19 1319  SpO2 100 % 02/18/19 1319    Last Pain:  Vitals:   02/18/19 1319  TempSrc: Temporal  PainSc: 0-No pain         Complications: No apparent anesthesia complications

## 2019-02-19 ENCOUNTER — Other Ambulatory Visit: Payer: Self-pay

## 2019-02-19 ENCOUNTER — Ambulatory Visit (INDEPENDENT_AMBULATORY_CARE_PROVIDER_SITE_OTHER): Payer: BC Managed Care – PPO | Admitting: Surgical

## 2019-02-19 ENCOUNTER — Encounter: Payer: Self-pay | Admitting: General Surgery

## 2019-02-19 VITALS — BP 141/86 | HR 77 | Temp 97.3°F | Ht 65.0 in | Wt 159.0 lb

## 2019-02-19 DIAGNOSIS — Z170 Estrogen receptor positive status [ER+]: Secondary | ICD-10-CM

## 2019-02-19 DIAGNOSIS — C50511 Malignant neoplasm of lower-outer quadrant of right female breast: Secondary | ICD-10-CM

## 2019-02-19 DIAGNOSIS — Z1502 Genetic susceptibility to malignant neoplasm of ovary: Secondary | ICD-10-CM

## 2019-02-19 DIAGNOSIS — Z1501 Genetic susceptibility to malignant neoplasm of breast: Secondary | ICD-10-CM

## 2019-02-19 DIAGNOSIS — Z1509 Genetic susceptibility to other malignant neoplasm: Secondary | ICD-10-CM

## 2019-02-19 NOTE — Progress Notes (Signed)
   Subjective:     Patient ID: Cheryl Mooney, female    DOB: April 13, 1965, 53 y.o.   MRN: [id number]  Chief Complaint  Patient presents with  . Follow-up   HPI: The patient is a 53 y.o. female here for follow-up after undergoing bilateral breast reconstruction.  She is healing really well.  Incisions are healing nicely, no sign of infection, hematoma, seroma.  She starts chemotherapy in 1 week and will undergo 2 months of a specific regimen and then transfer to 12 weeks of paclitaxel weekly.  She has not had any fever, chills, nausea, vomiting no rash.  She tolerated her last fill well.  She continues to have sensitivity of her upper chest.  There is no overlying skin changes noted.  Review of Systems  Constitutional: Positive for activity change. Negative for appetite change, chills, diaphoresis, fatigue and fever.  Respiratory: Negative.   Cardiovascular: Negative for chest pain.  Gastrointestinal: Negative for diarrhea, nausea and vomiting.  Musculoskeletal: Negative for myalgias.  Skin: Negative for color change, pallor, rash and wound.  Neurological: Negative for dizziness and weakness.    Objective:   Vital Signs BP (!) 141/86 (BP Location: Left Arm, Patient Position: Sitting, Cuff Size: Normal)   Pulse 77   Temp (!) 97.3 F (36.3 C) (Temporal)   Ht '5\' 5"'$  (1.651 m)   Wt 159 lb (72.1 kg)   SpO2 99%   BMI 26.46 kg/m  Vital Signs and Nursing Note Reviewed Chaperone present Physical Exam  Constitutional: She is oriented to person, place, and time and well-developed, well-nourished, and in no distress.  HENT:  Head: Normocephalic and atraumatic.  Cardiovascular: Normal rate.  Pulmonary/Chest: Effort normal.    Musculoskeletal: Normal range of motion.  Neurological: She is alert and oriented to person, place, and time. Gait normal.  Skin: Skin is warm and dry. No rash noted. She is not diaphoretic. No erythema. No pallor.  Psychiatric: Mood and affect normal.      Assessment/Plan:     ICD-10-CM   1. Malignant neoplasm of lower-outer quadrant of right breast of female, estrogen receptor positive (HCC)  C50.511    Z17.0   2. BRCA2 gene mutation positive in female  Z15.01    Z15.02    Z15.09    Cheryl Mooney is doing really well.  She had a Port-A-Cath placed yesterday by general surgery in the left chest wall, the area is slightly erythematous but there is no overt sign of infection.  Dermabond in place.  We placed injectable saline in the Expander using a sterile technique: Right: 30 cc for a total of 230 / 535 cc Left: 30 cc for a total of 230/535 cc  Follow-up in 2 weeks.  May need to hold on filling at that time pending response to chemotherapy.  Patient is more cautious to fill and would prefer to take things slow.  I believe this is a good idea we will see how she does after her first chemotherapy treatment next week.  She knows to call with any questions concerns.   Carola Rhine Cheryl Sakuma, PA-C 02/19/2019, 1:05 PM

## 2019-02-22 ENCOUNTER — Ambulatory Visit (INDEPENDENT_AMBULATORY_CARE_PROVIDER_SITE_OTHER): Payer: BC Managed Care – PPO

## 2019-02-22 ENCOUNTER — Other Ambulatory Visit: Payer: Self-pay

## 2019-02-22 DIAGNOSIS — Z23 Encounter for immunization: Secondary | ICD-10-CM

## 2019-02-25 ENCOUNTER — Encounter: Payer: Self-pay | Admitting: Oncology

## 2019-02-25 ENCOUNTER — Other Ambulatory Visit: Payer: Self-pay

## 2019-02-25 NOTE — Anesthesia Postprocedure Evaluation (Signed)
Anesthesia Post Note  Patient: Cheryl Mooney  Procedure(s) Performed: INSERTION PORT-A-CATH (Left )  Patient location during evaluation: PACU Anesthesia Type: General Level of consciousness: awake and alert Pain management: pain level controlled Vital Signs Assessment: post-procedure vital signs reviewed and stable Respiratory status: spontaneous breathing, nonlabored ventilation and respiratory function stable Cardiovascular status: blood pressure returned to baseline and stable Postop Assessment: no apparent nausea or vomiting Anesthetic complications: no     Last Vitals:  Vitals:   02/18/19 1305 02/18/19 1319  BP: 127/90 114/77  Pulse: (!) 55 (!) 57  Resp:  16  Temp: (!) 36.2 C (!) 36.3 C  SpO2: 99% 100%    Last Pain:  Vitals:   02/19/19 0826  TempSrc:   PainSc: 2                  Alphonsus Sias

## 2019-02-25 NOTE — Progress Notes (Signed)
Patient here for follow up. Patient had port placed last Thursday and she is doing well. No other concerns voiced.

## 2019-02-26 ENCOUNTER — Inpatient Hospital Stay: Payer: BC Managed Care – PPO

## 2019-02-26 ENCOUNTER — Inpatient Hospital Stay (HOSPITAL_BASED_OUTPATIENT_CLINIC_OR_DEPARTMENT_OTHER): Payer: BC Managed Care – PPO | Admitting: Oncology

## 2019-02-26 ENCOUNTER — Other Ambulatory Visit: Payer: Self-pay

## 2019-02-26 VITALS — BP 107/72 | HR 77 | Temp 97.5°F | Resp 18 | Wt 158.4 lb

## 2019-02-26 DIAGNOSIS — C50511 Malignant neoplasm of lower-outer quadrant of right female breast: Secondary | ICD-10-CM | POA: Diagnosis not present

## 2019-02-26 DIAGNOSIS — E871 Hypo-osmolality and hyponatremia: Secondary | ICD-10-CM

## 2019-02-26 DIAGNOSIS — Z1502 Genetic susceptibility to malignant neoplasm of ovary: Secondary | ICD-10-CM

## 2019-02-26 DIAGNOSIS — Z1501 Genetic susceptibility to malignant neoplasm of breast: Secondary | ICD-10-CM

## 2019-02-26 DIAGNOSIS — Z7189 Other specified counseling: Secondary | ICD-10-CM

## 2019-02-26 DIAGNOSIS — Z170 Estrogen receptor positive status [ER+]: Secondary | ICD-10-CM

## 2019-02-26 DIAGNOSIS — Z5111 Encounter for antineoplastic chemotherapy: Secondary | ICD-10-CM

## 2019-02-26 DIAGNOSIS — Z1509 Genetic susceptibility to other malignant neoplasm: Secondary | ICD-10-CM

## 2019-02-26 LAB — COMPREHENSIVE METABOLIC PANEL
ALT: 16 U/L (ref 0–44)
AST: 18 U/L (ref 15–41)
Albumin: 3.9 g/dL (ref 3.5–5.0)
Alkaline Phosphatase: 86 U/L (ref 38–126)
Anion gap: 6 (ref 5–15)
BUN: 8 mg/dL (ref 6–20)
CO2: 24 mmol/L (ref 22–32)
Calcium: 8.9 mg/dL (ref 8.9–10.3)
Chloride: 99 mmol/L (ref 98–111)
Creatinine, Ser: 0.6 mg/dL (ref 0.44–1.00)
GFR calc Af Amer: >60 mL/min (ref >60)
GFR calc non Af Amer: >60 mL/min (ref >60)
Glucose, Bld: 133 mg/dL — ABNORMAL HIGH (ref 70–99)
Potassium: 3.6 mmol/L (ref 3.5–5.1)
Sodium: 129 mmol/L — ABNORMAL LOW (ref 135–145)
Total Bilirubin: 0.5 mg/dL (ref 0.3–1.2)
Total Protein: 7 g/dL (ref 6.5–8.1)

## 2019-02-26 LAB — CBC WITH DIFFERENTIAL/PLATELET
Abs Immature Granulocytes: 0.02 10*3/uL (ref 0.00–0.07)
Basophils Absolute: 0 10*3/uL (ref 0.0–0.1)
Basophils Relative: 1 %
Eosinophils Absolute: 0.2 10*3/uL (ref 0.0–0.5)
Eosinophils Relative: 3 %
HCT: 33.8 % — ABNORMAL LOW (ref 36.0–46.0)
Hemoglobin: 11.4 g/dL — ABNORMAL LOW (ref 12.0–15.0)
Immature Granulocytes: 0 %
Lymphocytes Relative: 34 %
Lymphs Abs: 2.5 10*3/uL (ref 0.7–4.0)
MCH: 29.8 pg (ref 26.0–34.0)
MCHC: 33.7 g/dL (ref 30.0–36.0)
MCV: 88.5 fL (ref 80.0–100.0)
Monocytes Absolute: 0.6 10*3/uL (ref 0.1–1.0)
Monocytes Relative: 8 %
Neutro Abs: 4 10*3/uL (ref 1.7–7.7)
Neutrophils Relative %: 54 %
Platelets: 318 10*3/uL (ref 150–400)
RBC: 3.82 MIL/uL — ABNORMAL LOW (ref 3.87–5.11)
RDW: 11.9 % (ref 11.5–15.5)
WBC: 7.3 10*3/uL (ref 4.0–10.5)
nRBC: 0 % (ref 0.0–0.2)

## 2019-02-26 MED ORDER — SODIUM CHLORIDE 0.9 % IV SOLN
1000.0000 mg | Freq: Once | INTRAVENOUS | Status: AC
  Administered 2019-02-26: 1000 mg via INTRAVENOUS
  Filled 2019-02-26: qty 50

## 2019-02-26 MED ORDER — SODIUM CHLORIDE 0.9% FLUSH
10.0000 mL | INTRAVENOUS | Status: DC | PRN
  Administered 2019-02-26: 10 mL via INTRAVENOUS
  Filled 2019-02-26: qty 10

## 2019-02-26 MED ORDER — SODIUM CHLORIDE 0.9 % IV SOLN
Freq: Once | INTRAVENOUS | Status: AC
  Administered 2019-02-26: 10:00:00 via INTRAVENOUS
  Filled 2019-02-26: qty 5

## 2019-02-26 MED ORDER — HEPARIN SOD (PORK) LOCK FLUSH 100 UNIT/ML IV SOLN
500.0000 [IU] | Freq: Once | INTRAVENOUS | Status: AC
  Administered 2019-02-26: 500 [IU] via INTRAVENOUS

## 2019-02-26 MED ORDER — SODIUM CHLORIDE 0.9 % IV SOLN
Freq: Once | INTRAVENOUS | Status: AC
  Administered 2019-02-26: 10:00:00 via INTRAVENOUS
  Filled 2019-02-26: qty 250

## 2019-02-26 MED ORDER — PALONOSETRON HCL INJECTION 0.25 MG/5ML
0.2500 mg | Freq: Once | INTRAVENOUS | Status: AC
  Administered 2019-02-26: 0.25 mg via INTRAVENOUS
  Filled 2019-02-26: qty 5

## 2019-02-26 MED ORDER — HEPARIN SOD (PORK) LOCK FLUSH 100 UNIT/ML IV SOLN
500.0000 [IU] | Freq: Once | INTRAVENOUS | Status: DC | PRN
  Filled 2019-02-26: qty 5

## 2019-02-26 MED ORDER — DOXORUBICIN HCL CHEMO IV INJECTION 2 MG/ML
100.0000 mg | Freq: Once | INTRAVENOUS | Status: AC
  Administered 2019-02-26: 100 mg via INTRAVENOUS
  Filled 2019-02-26: qty 50

## 2019-02-26 NOTE — Progress Notes (Signed)
Hematology/Oncology Consult note [facility] Telephone:([phone] [phone]   Patient Care Team: Glean Hess, MD as PCP - General (Internal Medicine) Jannet Mantis, MD (Dermatology)  CHIEF COMPLAINTS/REASON FOR VISIT:  Follow-up for breast cancer.  HISTORY OF PRESENTING ILLNESS:  Cheryl Mooney is a  53 y.o.  female with PMH listed below who was referred to me for evaluation of breast cancer Patient reports feeling mass of her right breast week before her annual mammogram. 12/03/2018 patient had bilateral diagnostic mammogram and ultrasound  which showed suspicious 2.1 x 1.3 x 1.6 irregular mass at 7:00 in the right breast, 2 cm from the nipple. 2 borderline lymph nodes are seen in the right axilla.  1 of the nodes demonstrate a cortex of 4.4 mm.  Both lymph nodes demonstrated retention of fatty hila. Patient underwent ultrasound-guided biopsy of right breast mass as well as ultrasound-guided biopsy of 1 of the 2 mildly abnormal right axillary lymph nodes. 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 Right axilla lymph node negative for malignancy. ER> 90% positive, PR11-50% positive, HER-2 equivocal, 2+ by IHC.  FISH negative.   Nipple discharge: Denies Family history: Mother diagnosed with breast cancer at age of 55, and colon cancer at age of 68.  Mother has BRCA2 mutation.   Maternal grandmother breast cancer, maternal great aunt breast cancer.  Patient recalls that she was tested long time ago and was not aware of any results.  OCP use: In her 20s-30s Estrogen and progesterone therapy: denies History of radiation to chest: denies.  Previous breast surgery: Denies  BRCA 2 positive.  # 01/14/2019 patient underwent bilateral mastectomy with sentinel lymph node biopsy of left and the right axillary. Right breast showed invasive mammary carcinoma, DCIS positive,  apocrine metaplastic, stromal fibrosis, duct ectasia, simple cyst formation, usual epithelial hyperplasia.  Sentinel lymph node on the right side was negative. pT2 pN0 Left prophylactic mastectomy and a sentinel lymph node negative for malignancy.  #OncotypeDX recurrence score 31, adjuvant chemotherapy benefit more than 15%.  INTERVAL HISTORY Cheryl Mooney is a 53 y.o. female who has above history reviewed by me today presents for follow up visit for management of stage I breast cancer, ER PR positive, HER-2 negative, hereditary BRCA 2 mutation.  Problems and complaints are listed below: Patient with here for evaluation prior adjuvant chemotherapy. Patient reports that recently she had an episode of UTI and was treated with antibiotics.  She drinks a lot of free water  denies any urinary symptoms today. Reports feeling well today.  No new complaints. No concerns at her mastectomy sites. She has recently saw plastic surgeon Dr. Marla Roe, she has expander, expanding process will be on hold during chemotherapy. Mediport was placed by Dr. Peyton Najjar on 02/18/2019.    Review of Systems  Constitutional: Negative for appetite change, chills, fatigue and fever.  HENT:   Negative for hearing loss and voice change.   Eyes: Negative for eye problems.  Respiratory: Negative for chest tightness and cough.   Cardiovascular: Negative for chest pain.  Gastrointestinal: Negative for abdominal distention, abdominal pain and blood in stool.  Endocrine: Negative for hot flashes.  Genitourinary: Negative for difficulty urinating and frequency.   Musculoskeletal: Negative for arthralgias.  Skin: Negative for itching and rash.  Neurological: Negative for extremity weakness.  Hematological: Negative for adenopathy.  Psychiatric/Behavioral: Negative for confusion. The patient is not nervous/anxious.     MEDICAL HISTORY:  Past Medical History:  Diagnosis Date   Benign neoplasm of sigmoid colon     Benign neoplasm of transverse colon    Breast cancer ([place])    Family history of BRCA2 gene positive    Family history of breast cancer    Family history of colon cancer    Family history of lung cancer    Family history of pancreatic cancer    GERD (gastroesophageal reflux disease)    History of kidney stones    Hypertension    Malignant neoplasm of lower-outer quadrant of right breast of female, estrogen receptor positive ([place]) 12/17/2018   Smoker     SURGICAL HISTORY: Past Surgical History:  Procedure Laterality Date   AXILLARY SENTINEL NODE BIOPSY Bilateral 01/14/2019   Procedure: AXILLARY SENTINEL NODE BIOPSY;  Surgeon: Herbert Pun, MD;  Location: [facility];  Service: General;  Laterality: Bilateral;   BREAST BIOPSY Right 12/08/2018   [place] bx of mass with calcs path pending   BREAST BIOPSY Right 12/08/2018   [place] bx of LN, path pending   BREAST RECONSTRUCTION WITH PLACEMENT OF TISSUE EXPANDER AND ALLODERM Bilateral 01/14/2019   Procedure: BILATERAL BREAST RECONSTRUCTION WITH PLACEMENT OF TISSUE EXPANDER AND FLEX HD;  Surgeon: Wallace Going, DO;  Location: [facility];  Service: Plastics;  Laterality: Bilateral;   COLONOSCOPY WITH PROPOFOL N/A 02/17/2015   Procedure: COLONOSCOPY WITH PROPOFOL;  Surgeon: Lucilla Lame, MD;  Location: [place];  Service: Endoscopy;  Laterality: N/A;   POLYPECTOMY  02/17/2015   Procedure: POLYPECTOMY;  Surgeon: Lucilla Lame, MD;  Location: [place];  Service: Endoscopy;;   PORTACATH PLACEMENT Left 02/18/2019   Procedure: INSERTION PORT-A-CATH;  Surgeon: Herbert Pun, MD;  Location: [facility];  Service: General;  Laterality: Left;   SIMPLE MASTECTOMY WITH AXILLARY SENTINEL NODE BIOPSY Left 01/14/2019   Procedure: LEFT SIMPLE MASTECTOMY;  Surgeon: Herbert Pun, MD;  Location: [facility];  Service: General;  Laterality: Left;   TOTAL MASTECTOMY Right 01/14/2019   Procedure: RIGHT TOTAL  MASTECTOMY;  Surgeon: Herbert Pun, MD;  Location: [facility];  Service: General;  Laterality: Right;   WISDOM TOOTH EXTRACTION      SOCIAL HISTORY: Social History   Socioeconomic History   Marital status: Married    Spouse name: Not on file   Number of children: Not on file   Years of education: Not on file   Highest education level: Not on file  Occupational History   Not on file  Social Needs   Financial resource strain: Not on file   Food insecurity    Worry: Not on file    Inability: Not on file   Transportation needs    Medical: Not on file    Non-medical: Not on file  Tobacco Use   Smoking status: Current Every Day Smoker    Packs/day: 0.25    Years: 20.00    Pack years: 5.00    Types: Cigarettes   Smokeless tobacco: Never Used   Tobacco comment: 3 cig. per day  Substance and Sexual Activity   Alcohol use: Yes    Alcohol/week: 2.0 standard drinks    Types: 2 Standard drinks or equivalent per week    Comment: occasional   Drug use: No   Sexual activity: Yes    Birth control/protection: Post-menopausal  Lifestyle   Physical activity    Days per week: Not on file    Minutes per session: Not on file   Stress: Not on file  Relationships  Social Herbalist on phone: Not on file    Gets together: Not on file    Attends religious service: Not on file    Active member of club or organization: Not on file    Attends meetings of clubs or organizations: Not on file    Relationship status: Not on file   Intimate partner violence    Fear of current or ex partner: Not on file    Emotionally abused: Not on file    Physically abused: Not on file    Forced sexual activity: Not on file  Other Topics Concern   Not on file  Social History Narrative   Not on file  She is a retired Pharmacist, hospital  FAMILY HISTORY: Family History  Problem Relation Age of Onset   Colon cancer Mother 38   Breast cancer Mother 6       "BRCA2+"    Hypertension Father    Stroke Father    Alzheimer's disease Father    Prostate cancer Father    Breast cancer Maternal Aunt        mat great aunt   Breast cancer Maternal Grandmother 58   Pancreatic cancer Maternal Grandfather 68    ALLERGIES:  has No Known Allergies.  MEDICATIONS:  Current Outpatient Medications  Medication Sig Dispense Refill   acetaminophen (TYLENOL) 500 MG tablet Take 500 mg by mouth every 6 (six) hours as needed for mild pain.      bisacodyl (DULCOLAX) 5 MG EC tablet Take 5 mg by mouth daily as needed for mild constipation or moderate constipation.     cetirizine (ZYRTEC) 10 MG tablet Take 10 mg by mouth daily.     gabapentin (NEURONTIN) 300 MG capsule Take 300 mg by mouth 2 (two) times daily.     lisinopril-hydrochlorothiazide (ZESTORETIC) 20-25 MG tablet Take 1 tablet by mouth daily. 90 tablet 3   omeprazole (PRILOSEC) 40 MG capsule Take 1 capsule (40 mg total) by mouth daily. 90 capsule 1   dexamethasone (DECADRON) 4 MG tablet Take 2 tablets by mouth once a day on the day after chemotherapy and then take 2 tablets two times a day for 2 days. Take with food. (Patient not taking: Reported on 02/15/2019) 30 tablet 1   diazepam (VALIUM) 2 MG tablet Take 1 tablet (2 mg total) by mouth every 12 (twelve) hours as needed for muscle spasms. (Patient not taking: Reported on 02/26/2019) 20 tablet 0   HYDROcodone-acetaminophen (NORCO/VICODIN) 5-325 MG tablet Take 1 tablet by mouth every 6 (six) hours as needed for moderate pain.     lidocaine-prilocaine (EMLA) cream Apply to affected area once (Patient not taking: Reported on 02/25/2019) 30 g 3   ondansetron (ZOFRAN) 8 MG tablet Take 1 tablet (8 mg total) by mouth every 8 (eight) hours as needed for nausea or vomiting. (Patient not taking: Reported on 02/26/2019) 20 tablet 0   prochlorperazine (COMPAZINE) 10 MG tablet Take 1 tablet (10 mg total) by mouth every 6 (six) hours as needed (Nausea or vomiting).  (Patient not taking: Reported on 02/25/2019) 30 tablet 1   No current facility-administered medications for this visit.    Facility-Administered Medications Ordered in Other Visits  Medication Dose Route Frequency Provider Last Rate Last Dose   heparin lock flush 100 unit/mL  500 Units Intracatheter Once PRN Earlie Server, MD       sodium chloride flush (NS) 0.9 % injection 10 mL  10 mL Intravenous PRN Earlie Server, MD  10 mL at 02/26/19 0824     PHYSICAL EXAMINATION: ECOG PERFORMANCE STATUS: 0 - Asymptomatic Vitals:   02/26/19 0844  BP: 107/72  Pulse: 77  Resp: 18  Temp: (!) 97.5 F (36.4 C)   Filed Weights   02/26/19 0844  Weight: 158 lb 6.4 oz (71.8 kg)    Physical Exam Constitutional:      General: She is not in acute distress. HENT:     Head: Normocephalic and atraumatic.  Eyes:     General: No scleral icterus.    Pupils: Pupils are equal, round, and reactive to light.  Neck:     Musculoskeletal: Normal range of motion and neck supple.  Cardiovascular:     Rate and Rhythm: Normal rate and regular rhythm.     Heart sounds: Normal heart sounds.  Pulmonary:     Effort: Pulmonary effort is normal. No respiratory distress.     Breath sounds: No wheezing.  Abdominal:     General: Bowel sounds are normal. There is no distension.     Palpations: Abdomen is soft. There is no mass.     Tenderness: There is no abdominal tenderness.  Musculoskeletal: Normal range of motion.        General: No deformity.  Skin:    General: Skin is warm and dry.     Findings: No erythema or rash.  Neurological:     Mental Status: She is alert and oriented to person, place, and time.     Cranial Nerves: No cranial nerve deficit.     Coordination: Coordination normal.  Psychiatric:        Behavior: Behavior normal.        Thought Content: Thought content normal.    LABORATORY DATA:  I have reviewed the data as listed Lab Results  Component Value Date   WBC 7.3 02/26/2019   HGB 11.4 (L)  02/26/2019   HCT 33.8 (L) 02/26/2019   MCV 88.5 02/26/2019   PLT 318 02/26/2019   Recent Labs    11/23/18 1020 01/05/19 1514 02/26/19 0824  NA 139 138 129*  K 5.2 3.6 3.6  CL 97 103 99  CO2 _0 GLUCOSE 112* 101* 133*  BUN _1 CREATININE 0.79 0.63 0.60  CALCIUM 9.9 9.8 8.9  GFRNONAA 85 >60 >60  GFRAA 97 >60 >60  PROT 7.3 7.7 7.0  ALBUMIN 4.7 4.5 3.9  AST _2 ALT _3 ALKPHOS 94 95 86  BILITOT 0.2 0.4 0.5   Iron/TIBC/Ferritin/ %Sat No results found for: IRON, TIBC, FERRITIN, IRONPCTSAT   RADIOGRAPHIC STUDIES: I have personally reviewed the radiological images as listed and agreed with the findings in the report. X-ray Chest Pa Or Ap  Result Date: 02/18/2019 CLINICAL DATA:  53 year old female with Port-A-Cath placement. EXAM: CHEST  1 VIEW COMPARISON:  None. FINDINGS: Left-sided Port-A-Cath with tip at the cavoatrial junction. No pneumothorax. Mild diffuse interstitial prominence. No focal consolidation, or pleural effusion. The cardiac silhouette is within normal limits. No acute osseous pathology. Bilateral breast implants or tissue expanders noted. IMPRESSION: Port-A-Cath with tip at the cavoatrial junction. No pneumothorax. Electronically Signed   By: Anner Crete M.D.   On: 02/18/2019 13:19   Nm Cardiac Muga Rest  Result Date: 02/11/2019 CLINICAL DATA:  Breast cancer. Evaluate cardiac function in relation to chemotherapy. EXAM: NUCLEAR MEDICINE CARDIAC BLOOD POOL IMAGING (MUGA) TECHNIQUE: Cardiac multi-gated acquisition was performed at rest following intravenous injection of Tc-29mlabeled red  blood cells. RADIOPHARMACEUTICALS:  21.8 mCi Tc-62mpertechnetate in-vitro labeled red blood cells IV COMPARISON:  None. FINDINGS: No  focal wall motion abnormality of the left ventricle. Calculated left ventricular ejection fraction equals 59.3% IMPRESSION: Left ventricular ejection fraction equals59 %. Electronically Signed   By: SSuzy BouchardM.D.   On:  02/11/2019 16:10   Nm Sentinel Node Injection  Result Date: 01/14/2019 CLINICAL DATA:  Right breast cancer. EXAM: NUCLEAR MEDICINE BREAST LYMPHOSCINTIGRAPHY TECHNIQUE: Intradermal injection of radiopharmaceutical was performed at the 12 o'clock, 3 o'clock, 6 o'clock, and 9 o'clock positions around the right nipple. The patient was then sent to the operating room where the sentinel node(s) were identified and removed by the surgeon. RADIOPHARMACEUTICALS:  Total of 1 mCi Millipore-filtered Technetium-990mulfur colloid, injected in four aliquots of 0.25 mCi each. IMPRESSION: Uncomplicated intradermal injection of a total of 1 mCi Technetium-994mlfur colloid for purposes of sentinel node identification. Electronically Signed   By: MarInez CatalinaD.   On: 01/14/2019 09:11   Nm Sentinel Node Injection  Result Date: 01/14/2019 CLINICAL DATA:  Left breast cancer. EXAM: NUCLEAR MEDICINE BREAST LYMPHOSCINTIGRAPHY TECHNIQUE: Intradermal injection of radiopharmaceutical was performed at the 12 o'clock, 3 o'clock, 6 o'clock, and 9 o'clock positions around the left nipple. The patient was then sent to the operating room where the sentinel node(s) were identified and removed by the surgeon. RADIOPHARMACEUTICALS:  Total of 1 mCi Millipore-filtered Technetium-23m40mfur colloid, injected in four aliquots of 0.25 mCi each. IMPRESSION: Uncomplicated intradermal injection of a total of 1 mCi Technetium-23m 3mur colloid for purposes of sentinel node identification. Electronically Signed   By: Mark Inez Catalina   On: 01/14/2019 09:11   Dg C-arm 1-60 Min-no Report  Result Date: 02/18/2019 Fluoroscopy was utilized by the requesting physician.  No radiographic interpretation.   Us BrKoreast Ltd Uni Left Inc Axilla  Result Date: 12/08/2018 CLINICAL DATA:  53 ye78 old patient presents today for a biopsy of the right breast and of the right axilla. Since she was last seen here for her diagnostic mammogram and ultrasound on  December 03, 2018, she has noticed a palpable lump in the superior left breast, 12-1 o'clock region. This area in the left breast was evaluated today after her biopsies were performed today. EXAM: DIGITAL DIAGNOSTIC LEFT MAMMOGRAM WITH CAD AND TOMO ULTRASOUND LEFT BREAST COMPARISON:  December 03, 2018 ACR Breast Density Category c: The breast tissue is heterogeneously dense, which may obscure small masses. FINDINGS: Metallic skin marker was placed in the region of patient concern in the superior left breast and a spot tangential view of the region of palpable concern was performed. In this region of the left breast, normal fatty breast parenchyma is imaged. There is a normal intramammary lymph node deep to the marker. No suspicious mass, microcalcification, or distortion is seen. Mammographic images were processed with CAD. On physical exam, I palpate soft fat lobules in the far superior left breast at the 1 o'clock region far superiorly, in the region of patient concern. This is approximately 8-9 cm from the nipple. I do not palpate a suspicious mass. Targeted ultrasound is performed, showing normal fatty breast parenchyma in the 1 o'clock region of the left breast 8-9 cm from the nipple in the region of patient concern. No suspicious findings on ultrasound. IMPRESSION: No evidence of malignancy in the region of patient concern in the superior left breast. RECOMMENDATION: Recommendation for the left breast is a mammogram in 1 year. Please note that the patient had right-sided breast  biopsies performed today, dictated separately, for which recommendations will follow after the pathology is available. I have discussed the findings and recommendations with the patient. Results were also provided in writing at the conclusion of the visit. If applicable, a reminder letter will be sent to the patient regarding the next appointment. BI-RADS CATEGORY  1: Negative. Electronically Signed   By: Curlene Dolphin M.D.   On: 12/08/2018  14:24   US Breast Ltd Uni Right Inc Axilla  Result Date: 12/03/2018 CLINICAL DATA:  Newly palpable lump in the right breast. EXAM: DIGITAL DIAGNOSTIC BILATERAL MAMMOGRAM WITH CAD AND TOMO ULTRASOUND RIGHT BREAST COMPARISON:  Previous exam(s). ACR Breast Density Category c: The breast tissue is heterogeneously dense, which may obscure small masses. FINDINGS: There is a new irregular mass in the right breast measuring approximately 2 cm mammographically. There are calcifications in and anterior to the mass. The calcifications anterior to the mass span 11 mm. The mass and the calcifications taken together span 2.8 cm. Mammographic images were processed with CAD. On physical exam, there is a firm lump in the lateral right breast. Targeted ultrasound is performed, showing an irregular mass containing calcifications in the right breast at 7 o'clock, 2 cm from the nipple measuring 2.1 x 1.3 by 1.6 cm. There are calcifications within the mass. Two borderline lymph nodes are seen in the right axilla. One of the nodes demonstrates a cortex of 4.4 mm. Both lymph nodes demonstrate retention of fatty hila. IMPRESSION: Suspicious mass with calcifications in the right breast as above. Two borderline to mildly abnormal lymph nodes in the right axilla. RECOMMENDATION: Recommend ultrasound-guided biopsy of the right breast mass. Recommend ultrasound-guided biopsy of 1 of the 2 mildly abnormal right axillary lymph nodes. I have discussed the findings and recommendations with the patient. Results were also provided in writing at the conclusion of the visit. If applicable, a reminder letter will be sent to the patient regarding the next appointment. BI-RADS CATEGORY  5: Highly suggestive of malignancy. Electronically Signed   By: Dorise Bullion III M.D   On: 12/03/2018 15:05   Mm Diag Breast Tomo Uni Left  Result Date: 12/08/2018 CLINICAL DATA:  53 year old patient presents today for a biopsy of the right breast and of the right  axilla. Since she was last seen here for her diagnostic mammogram and ultrasound on December 03, 2018, she has noticed a palpable lump in the superior left breast, 12-1 o'clock region. This area in the left breast was evaluated today after her biopsies were performed today. EXAM: DIGITAL DIAGNOSTIC LEFT MAMMOGRAM WITH CAD AND TOMO ULTRASOUND LEFT BREAST COMPARISON:  December 03, 2018 ACR Breast Density Category c: The breast tissue is heterogeneously dense, which may obscure small masses. FINDINGS: Metallic skin marker was placed in the region of patient concern in the superior left breast and a spot tangential view of the region of palpable concern was performed. In this region of the left breast, normal fatty breast parenchyma is imaged. There is a normal intramammary lymph node deep to the marker. No suspicious mass, microcalcification, or distortion is seen. Mammographic images were processed with CAD. On physical exam, I palpate soft fat lobules in the far superior left breast at the 1 o'clock region far superiorly, in the region of patient concern. This is approximately 8-9 cm from the nipple. I do not palpate a suspicious mass. Targeted ultrasound is performed, showing normal fatty breast parenchyma in the 1 o'clock region of the left breast 8-9 cm from the  nipple in the region of patient concern. No suspicious findings on ultrasound. IMPRESSION: No evidence of malignancy in the region of patient concern in the superior left breast. RECOMMENDATION: Recommendation for the left breast is a mammogram in 1 year. Please note that the patient had right-sided breast biopsies performed today, dictated separately, for which recommendations will follow after the pathology is available. I have discussed the findings and recommendations with the patient. Results were also provided in writing at the conclusion of the visit. If applicable, a reminder letter will be sent to the patient regarding the next appointment. BI-RADS  CATEGORY  1: Negative. Electronically Signed   By: Curlene Dolphin M.D.   On: 12/08/2018 14:24   Mm Diag Breast Tomo Bilateral  Result Date: 12/03/2018 CLINICAL DATA:  Newly palpable lump in the right breast. EXAM: DIGITAL DIAGNOSTIC BILATERAL MAMMOGRAM WITH CAD AND TOMO ULTRASOUND RIGHT BREAST COMPARISON:  Previous exam(s). ACR Breast Density Category c: The breast tissue is heterogeneously dense, which may obscure small masses. FINDINGS: There is a new irregular mass in the right breast measuring approximately 2 cm mammographically. There are calcifications in and anterior to the mass. The calcifications anterior to the mass span 11 mm. The mass and the calcifications taken together span 2.8 cm. Mammographic images were processed with CAD. On physical exam, there is a firm lump in the lateral right breast. Targeted ultrasound is performed, showing an irregular mass containing calcifications in the right breast at 7 o'clock, 2 cm from the nipple measuring 2.1 x 1.3 by 1.6 cm. There are calcifications within the mass. Two borderline lymph nodes are seen in the right axilla. One of the nodes demonstrates a cortex of 4.4 mm. Both lymph nodes demonstrate retention of fatty hila. IMPRESSION: Suspicious mass with calcifications in the right breast as above. Two borderline to mildly abnormal lymph nodes in the right axilla. RECOMMENDATION: Recommend ultrasound-guided biopsy of the right breast mass. Recommend ultrasound-guided biopsy of 1 of the 2 mildly abnormal right axillary lymph nodes. I have discussed the findings and recommendations with the patient. Results were also provided in writing at the conclusion of the visit. If applicable, a reminder letter will be sent to the patient regarding the next appointment. BI-RADS CATEGORY  5: Highly suggestive of malignancy. Electronically Signed   By: Dorise Bullion III M.D   On: 12/03/2018 15:05   Mm Clip Placement Right  Result Date: 12/08/2018 CLINICAL DATA:   Ultrasound-guided core needle biopsy was performed of a suspicious mass in the 7 o'clock position of the right breast and of a right axillary lymph node with mild cortical thickening. EXAM: DIAGNOSTIC RIGHT MAMMOGRAM POST ULTRASOUND BIOPSIES COMPARISON:  Previous exam(s). FINDINGS: Mammographic images were obtained following ultrasound guided biopsy of a suspicious right breast mass at 7 o'clock position. A coil shaped biopsy clip is satisfactorily positioned within the suspicious mass. Again noted are calcifications within the mass. Similar-appearing calcifications extend anterior to the mass. HydroMARK biopsy clip is seen within the right axilla in the expected location of the biopsied lymph node. IMPRESSION: Satisfactory position of coil shaped biopsy clip within the suspicious right breast mass. HydroMARK biopsy clip confirmed in the right axilla, in the expected region of the axillary node biopsy. Final Assessment: Post Procedure Mammograms for Marker Placement Electronically Signed   By: Curlene Dolphin M.D.   On: 12/08/2018 14:27   [place] Rt Breast Bx W Loc Dev 1st Lesion Img Bx Spec US Guide  Addendum Date: 12/11/2018   ADDENDUM REPORT: 12/09/2018  16:08 ADDENDUM: PATHOLOGY revealed: A. BREAST, RIGHT 7:00 2 CM FN; - INVASIVE MAMMARY CARCINOMA, NO SPECIAL TYPE. 9 mm in this sample. Grade 3. 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 Lymphovascular invasion: Present. B. LYMPH NODE, RIGHT AXILLA; ULTRASOUND-GUIDED BIOPSY: - LYMPH NODE, NEGATIVE FOR MALIGNANCY. Pathology results are CONCORDANT with imaging findings, per Dr. Curlene Dolphin. Pathology results were discussed with patient via telephone. The patient reported doing well after the biopsy with tenderness at the site. Post biopsy care instructions were reviewed and questions were answered. The patient was encouraged to call [facility] for any additional concerns. Recommendation: Surgical referral. Request for surgical referral was  relayed to nurse navigators at [facility] by Electa Sniff RN on 12/09/2018. Addendum by Electa Sniff RN on 12/09/2018. Electronically Signed   By: Curlene Dolphin M.D.   On: 12/09/2018 16:08   Result Date: 12/11/2018 CLINICAL DATA:  Ultrasound-guided core needle biopsy was recommended of a suspicious mass in the 7 o'clock position of the right breast. EXAM: ULTRASOUND GUIDED RIGHT BREAST CORE NEEDLE BIOPSY COMPARISON:  Previous exam(s). FINDINGS: I met with the patient and we discussed the procedure of ultrasound-guided biopsy, including benefits and alternatives. We discussed the high likelihood of a successful procedure. We discussed the risks of the procedure, including infection, bleeding, tissue injury, clip migration, and inadequate sampling. Informed written consent was given. The usual time-out protocol was performed immediately prior to the procedure. Lesion quadrant: Lower outer quadrant Using sterile technique and 1% Lidocaine as local anesthetic, under direct ultrasound visualization, a 12 gauge spring-loaded device was used to perform biopsy of an approximately 2.1 cm mass at 7 o'clock position right breast using a lateral approach. At the conclusion of the procedure a coil tissue marker clip was deployed into the biopsy cavity. Follow up 2 view mammogram was performed and dictated separately. IMPRESSION: Ultrasound guided biopsy of the right breast. No apparent complications. Electronically Signed: By: Curlene Dolphin M.D. On: 12/08/2018 17:03   [place] Rt Breast Bx W Loc Dev Ea Add Lesion Img Bx Spec US Guide  Addendum Date: 12/11/2018   ADDENDUM REPORT: 12/09/2018 16:06 ADDENDUM: PATHOLOGY revealed: A. BREAST, RIGHT 7:00 2 CM FN; - INVASIVE MAMMARY CARCINOMA, NO SPECIAL TYPE. 9 mm in this sample. Grade 3. 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 Lymphovascular invasion: Present. B. LYMPH NODE, RIGHT AXILLA; ULTRASOUND-GUIDED BIOPSY: - LYMPH NODE, NEGATIVE FOR MALIGNANCY.  Pathology results are CONCORDANT with imaging findings, per Dr. Curlene Dolphin. Pathology results were discussed with patient via telephone. The patient reported doing well after the biopsy with tenderness at the site. Post biopsy care instructions were reviewed and questions were answered. The patient was encouraged to call [facility] for any additional concerns. Recommendation: Surgical referral. Request for surgical referral was relayed to nurse navigators at [place] by Electa Sniff RN on 12/09/2018. Addendum by Electa Sniff RN on 12/09/2018. Electronically Signed   By: Curlene Dolphin M.D.   On: 12/09/2018 16:06   Result Date: 12/11/2018 CLINICAL DATA:  Ultrasound-guided core needle biopsy was recommended of a right axillary lymph node. EXAM: [place] AXILLARY NODE CORE BIOPSY RIGHT COMPARISON:  Previous exam(s). FINDINGS: I met with the patient and we discussed the procedure of ultrasound-guided biopsy, including benefits and alternatives. We discussed the high likelihood of a successful procedure. We discussed the risks of the procedure, including infection, bleeding, tissue injury, clip migration, and inadequate sampling. Informed written consent was given. The usual time-out protocol was  performed immediately prior to the procedure. Using sterile technique and 1% Lidocaine as local anesthetic, under direct ultrasound visualization, a 14 gauge spring-loaded device was used to perform biopsy of A RIGHT AXILLARY LYMPH NODE WITH A CORTICAL THICKNESS OF APPROXIMATELY 4 MM using a lateral approach. At the conclusion of the procedure a HydroMARK tissue marker clip was deployed into the biopsy cavity. Follow up 2 view mammogram was performed and dictated separately. IMPRESSION: Ultrasound guided biopsy of the right axilla. No apparent complications. Electronically Signed: By: Curlene Dolphin M.D. On: 12/08/2018 17:02      ASSESSMENT & PLAN:  1. BRCA2 gene mutation positive in female   2.  Malignant neoplasm of lower-outer quadrant of right breast of female, estrogen receptor positive ([place])   3. Encounter for antineoplastic chemotherapy   4. Goals of care, counseling/discussion   5. Hyponatremia    Stage IB pT2 pN0 M0 ER + PR+ HER2 negative right breast cancer, grade 3, +LVI,   -BRCA2 positive Oncotype 31. Patient will proceed with adjuvant chemotherapy with dose dense AC every 2 weeks x4 followed by weekly Taxol x12. Goal of care, curative discussed with patient.  Labs are reviewed and discussed with patient.  Overall her blood counts are acceptable to proceed with chemotherapy today. Patient will receive growth factors on day 4. Growth factor would be given as chemotherapy-induced neutropenia to prevent febrile neutropenias. Discussed potential side effect- myalgias/arthralgias- recommend Claritin for 4 days.  Patient agrees with the plan.  Hyponatremia, sodium is 129, she provides history of recently drinking a lot of free water.  She is asymptomatic.  Recommend patient to liberate salt intake for the next 1 to 2 weeks.  Also encourage drinking electrolyte water/Gatorade. She will repeat BMP in 3 to 4 days to ensure recovery of anemia. Follow-up with nurse practitioner in 1 week for evaluation of tolerability chemotherapy.  #BRCA gene mutation positive. She has establish care with gynecology for further evaluation. Screening for pancreatic cancer is controversial.  Will discuss in the future once he finished chemotherapy..     Orders Placed This Encounter  Procedures   Basic metabolic panel    Standing Status:   Future    Standing Expiration Date:   02/26/2020    All questions were answered. The patient knows to call the clinic with any problems questions or concerns.     Earlie Server, MD, PhD Hematology Oncology [facility] at [facility] Pager- [phone] 02/26/2019

## 2019-02-27 ENCOUNTER — Encounter: Payer: Self-pay | Admitting: Plastic Surgery

## 2019-03-01 ENCOUNTER — Inpatient Hospital Stay: Payer: BC Managed Care – PPO

## 2019-03-01 ENCOUNTER — Other Ambulatory Visit: Payer: Self-pay

## 2019-03-01 DIAGNOSIS — C50511 Malignant neoplasm of lower-outer quadrant of right female breast: Secondary | ICD-10-CM

## 2019-03-01 DIAGNOSIS — Z170 Estrogen receptor positive status [ER+]: Secondary | ICD-10-CM

## 2019-03-01 DIAGNOSIS — E871 Hypo-osmolality and hyponatremia: Secondary | ICD-10-CM

## 2019-03-01 LAB — BASIC METABOLIC PANEL
Anion gap: 7 (ref 5–15)
BUN: 18 mg/dL (ref 6–20)
CO2: 24 mmol/L (ref 22–32)
Calcium: 8.9 mg/dL (ref 8.9–10.3)
Chloride: 101 mmol/L (ref 98–111)
Creatinine, Ser: 0.63 mg/dL (ref 0.44–1.00)
GFR calc Af Amer: >60 mL/min (ref >60)
GFR calc non Af Amer: >60 mL/min (ref >60)
Glucose, Bld: 155 mg/dL — ABNORMAL HIGH (ref 70–99)
Potassium: 3.4 mmol/L — ABNORMAL LOW (ref 3.5–5.1)
Sodium: 132 mmol/L — ABNORMAL LOW (ref 135–145)

## 2019-03-01 MED ORDER — PEGFILGRASTIM-JMDB 6 MG/0.6ML ~~LOC~~ SOSY
6.0000 mg | PREFILLED_SYRINGE | Freq: Once | SUBCUTANEOUS | Status: AC
  Administered 2019-03-01: 6 mg via SUBCUTANEOUS
  Filled 2019-03-01: qty 0.6

## 2019-03-02 ENCOUNTER — Telehealth: Payer: Self-pay

## 2019-03-02 NOTE — Telephone Encounter (Signed)
Telephone call to patient but no answer.  Left message to let her know I was calling to check on her after receiving first chemotherapy.   Encouraged patient to call for any questions or concerns.

## 2019-03-03 ENCOUNTER — Encounter: Payer: Self-pay | Admitting: Nurse Practitioner

## 2019-03-03 ENCOUNTER — Inpatient Hospital Stay (HOSPITAL_BASED_OUTPATIENT_CLINIC_OR_DEPARTMENT_OTHER): Payer: BC Managed Care – PPO | Admitting: Nurse Practitioner

## 2019-03-03 ENCOUNTER — Inpatient Hospital Stay: Payer: BC Managed Care – PPO

## 2019-03-03 ENCOUNTER — Other Ambulatory Visit: Payer: Self-pay

## 2019-03-03 VITALS — BP 139/89 | HR 67 | Temp 98.2°F | Resp 18 | Wt 161.3 lb

## 2019-03-03 DIAGNOSIS — R0789 Other chest pain: Secondary | ICD-10-CM | POA: Diagnosis not present

## 2019-03-03 DIAGNOSIS — C50511 Malignant neoplasm of lower-outer quadrant of right female breast: Secondary | ICD-10-CM | POA: Diagnosis not present

## 2019-03-03 DIAGNOSIS — K5900 Constipation, unspecified: Secondary | ICD-10-CM | POA: Diagnosis not present

## 2019-03-03 DIAGNOSIS — I10 Essential (primary) hypertension: Secondary | ICD-10-CM | POA: Diagnosis not present

## 2019-03-03 DIAGNOSIS — G8918 Other acute postprocedural pain: Secondary | ICD-10-CM

## 2019-03-03 DIAGNOSIS — M62838 Other muscle spasm: Secondary | ICD-10-CM

## 2019-03-03 DIAGNOSIS — E871 Hypo-osmolality and hyponatremia: Secondary | ICD-10-CM | POA: Diagnosis not present

## 2019-03-03 LAB — COMPREHENSIVE METABOLIC PANEL
ALT: 21 U/L (ref 0–44)
AST: 12 U/L — ABNORMAL LOW (ref 15–41)
Albumin: 3.8 g/dL (ref 3.5–5.0)
Alkaline Phosphatase: 86 U/L (ref 38–126)
Anion gap: 7 (ref 5–15)
BUN: 15 mg/dL (ref 6–20)
CO2: 22 mmol/L (ref 22–32)
Calcium: 8.5 mg/dL — ABNORMAL LOW (ref 8.9–10.3)
Chloride: 103 mmol/L (ref 98–111)
Creatinine, Ser: 0.53 mg/dL (ref 0.44–1.00)
GFR calc Af Amer: >60 mL/min (ref >60)
GFR calc non Af Amer: >60 mL/min (ref >60)
Glucose, Bld: 97 mg/dL (ref 70–99)
Potassium: 3.3 mmol/L — ABNORMAL LOW (ref 3.5–5.1)
Sodium: 132 mmol/L — ABNORMAL LOW (ref 135–145)
Total Bilirubin: 0.6 mg/dL (ref 0.3–1.2)
Total Protein: 6.6 g/dL (ref 6.5–8.1)

## 2019-03-03 LAB — CBC WITH DIFFERENTIAL/PLATELET
Abs Immature Granulocytes: 5.78 10*3/uL — ABNORMAL HIGH (ref 0.00–0.07)
Basophils Absolute: 0.2 10*3/uL — ABNORMAL HIGH (ref 0.0–0.1)
Basophils Relative: 0 %
Eosinophils Absolute: 0 10*3/uL (ref 0.0–0.5)
Eosinophils Relative: 0 %
HCT: 32.6 % — ABNORMAL LOW (ref 36.0–46.0)
Hemoglobin: 10.9 g/dL — ABNORMAL LOW (ref 12.0–15.0)
Immature Granulocytes: 16 %
Lymphocytes Relative: 7 %
Lymphs Abs: 2.4 10*3/uL (ref 0.7–4.0)
MCH: 29.8 pg (ref 26.0–34.0)
MCHC: 33.4 g/dL (ref 30.0–36.0)
MCV: 89.1 fL (ref 80.0–100.0)
Monocytes Absolute: 0.1 10*3/uL (ref 0.1–1.0)
Monocytes Relative: 0 %
Neutro Abs: 27.2 10*3/uL — ABNORMAL HIGH (ref 1.7–7.7)
Neutrophils Relative %: 77 %
Platelets: 328 10*3/uL (ref 150–400)
RBC: 3.66 MIL/uL — ABNORMAL LOW (ref 3.87–5.11)
RDW: 12.1 % (ref 11.5–15.5)
WBC: 35.7 10*3/uL — ABNORMAL HIGH (ref 4.0–10.5)
nRBC: 0 % (ref 0.0–0.2)

## 2019-03-03 MED ORDER — DULOXETINE HCL 30 MG PO CPEP: 30.0000 mg | ORAL_CAPSULE | Freq: Every day | ORAL | 0 refills | Status: DC

## 2019-03-03 NOTE — Progress Notes (Signed)
Patient here for post chemo follow up. Pt having some constipation, has not had BM since Monday. Since she got Fulphila, pt has had a sore throat, she feels like lymph nodes are swollen. Patient's main concern is sodium level. She believes that low sodium is an interaction between lisinopril and gabapentin.

## 2019-03-03 NOTE — Progress Notes (Signed)
Symptom Management [place]  [phone] [phone]  Patient Care Team: Glean Hess, MD as PCP - General (Internal Medicine) Jannet Mantis, MD (Dermatology)   Name of the patient: Cheryl Mooney  [id number]  25-Mar-1966   Date of visit: 03/03/19  Diagnosis-breast cancer  Chief complaint/ Reason for visit-pain, hyponatremia  Heme/Onc history:  Oncology History  Malignant neoplasm of lower-outer quadrant of right breast of female, estrogen receptor positive ([place])  12/17/2018 Initial Diagnosis   Malignant neoplasm of lower-outer quadrant of right breast of female, estrogen receptor positive ([place])   01/21/2019 Cancer Staging   Staging form: Breast, AJCC 8th Edition - Pathologic stage from 01/21/2019: Stage IB (pT2, pN0, cM0, G3, ER+, PR+, HER2-) - Signed by Earlie Server, MD on 01/22/2019   02/26/2019 -  Chemotherapy   The patient had DOXOrubicin (ADRIAMYCIN) chemo injection 100 mg, 110 mg, Intravenous,  Once, 1 of 4 cycles Administration: 100 mg (02/26/2019) palonosetron (ALOXI) injection 0.25 mg, 0.25 mg, Intravenous,  Once, 1 of 4 cycles Administration: 0.25 mg (02/26/2019) pegfilgrastim-jmdb (FULPHILA) injection 6 mg, 6 mg, Subcutaneous,  Once, 1 of 4 cycles Administration: 6 mg (03/01/2019) cyclophosphamide (CYTOXAN) 1,000 mg in sodium chloride 0.9 % 250 mL chemo infusion, 1,100 mg, Intravenous,  Once, 1 of 4 cycles Administration: 1,000 mg (02/26/2019) PACLitaxel (TAXOL) 144 mg in sodium chloride 0.9 % 250 mL chemo infusion (</= 62m/m2), 80 mg/m2, Intravenous,  Once, 0 of 12 cycles fosaprepitant (EMEND) 150 mg, dexamethasone (DECADRON) 12 mg in sodium chloride 0.9 % 145 mL IVPB, , Intravenous,  Once, 1 of 4 cycles Administration:  (02/26/2019)  for chemotherapy treatment.      Interval history- DHinda Mooney 53year old female with above history of breast cancer, currently status post adjuvant chemotherapy  with Adriamycin and Cytoxan, presents to symptom management clinic for complaints of nerve-like pain and follow-up after chemotherapy.  Previously, she was found to be hyponatremic with sodium level of 129.  She was asked to liberate salt intake and drink electrolyte solutions.  Today, she questions possible interaction between her lisinopril and gabapentin as possible cause of her low sodium levels.  She localizes pain across her chest from prior surgical sites.  She also has discomfort from her expanders.  She has had some constipation as well.  Last BM was Monday.  Also concerned that her lymph nodes may be swollen and sore throat. She denies any dizziness or weakness.  Denies recent fevers or illness.  Denies easy bleeding or bruising.  Denies chest pain, nausea, vomiting, diarrhea.  Denies urinary complaints.  ECOG FS:1 - Symptomatic but completely ambulatory  Review of systems- Review of Systems  Constitutional: Negative for chills, fever, malaise/fatigue and weight loss.  HENT: Positive for sore throat. Negative for congestion, ear pain, hearing loss, nosebleeds and tinnitus.   Eyes: Negative for blurred vision and double vision.  Respiratory: Negative for cough, hemoptysis, shortness of breath and wheezing.   Cardiovascular: Negative for chest pain, palpitations and leg swelling.  Gastrointestinal: Positive for constipation. Negative for abdominal pain, blood in stool, diarrhea, melena, nausea and vomiting.  Genitourinary: Negative for dysuria and urgency.  Musculoskeletal: Positive for myalgias. Negative for back pain, falls and joint pain.  Skin: Negative for itching and rash.  Neurological: Positive for sensory change. Negative for dizziness, tingling, loss of consciousness, weakness and headaches.  Endo/Heme/Allergies: Negative for environmental allergies. Does not bruise/bleed easily.  Psychiatric/Behavioral: Negative for depression. The patient is nervous/anxious and has insomnia.  Current treatment-adjuvant Adriamycin and Cytoxan followed by weekly Taxol  No Known Allergies  Past Medical History:  Diagnosis Date  . Benign neoplasm of sigmoid colon   . Benign neoplasm of transverse colon   . Breast cancer ([place])   . Family history of BRCA2 gene positive   . Family history of breast cancer   . Family history of colon cancer   . Family history of lung cancer   . Family history of pancreatic cancer   . GERD (gastroesophageal reflux disease)   . History of kidney stones   . Hypertension   . Malignant neoplasm of lower-outer quadrant of right breast of female, estrogen receptor positive ([place]) 12/17/2018  . Smoker     Past Surgical History:  Procedure Laterality Date  . AXILLARY SENTINEL NODE BIOPSY Bilateral 01/14/2019   Procedure: AXILLARY SENTINEL NODE BIOPSY;  Surgeon: Herbert Pun, MD;  Location: [facility] ORS;  Service: General;  Laterality: Bilateral;  . BREAST BIOPSY Right 12/08/2018   [place] bx of mass with calcs path pending  . BREAST BIOPSY Right 12/08/2018   [place] bx of LN, path pending  . BREAST RECONSTRUCTION WITH PLACEMENT OF TISSUE EXPANDER AND ALLODERM Bilateral 01/14/2019   Procedure: BILATERAL BREAST RECONSTRUCTION WITH PLACEMENT OF TISSUE EXPANDER AND FLEX HD;  Surgeon: Wallace Going, DO;  Location: [facility];  Service: Plastics;  Laterality: Bilateral;  . COLONOSCOPY WITH PROPOFOL N/A 02/17/2015   Procedure: COLONOSCOPY WITH PROPOFOL;  Surgeon: Lucilla Lame, MD;  Location: [place];  Service: Endoscopy;  Laterality: N/A;  . POLYPECTOMY  02/17/2015   Procedure: POLYPECTOMY;  Surgeon: Lucilla Lame, MD;  Location: [place];  Service: Endoscopy;;  . PORTACATH PLACEMENT Left 02/18/2019   Procedure: INSERTION PORT-A-CATH;  Surgeon: Herbert Pun, MD;  Location: [facility];  Service: General;  Laterality: Left;  . SIMPLE MASTECTOMY WITH AXILLARY SENTINEL NODE BIOPSY Left 01/14/2019   Procedure: LEFT SIMPLE MASTECTOMY;   Surgeon: Herbert Pun, MD;  Location: [facility];  Service: General;  Laterality: Left;  . TOTAL MASTECTOMY Right 01/14/2019   Procedure: RIGHT TOTAL MASTECTOMY;  Surgeon: Herbert Pun, MD;  Location: [facility];  Service: General;  Laterality: Right;  . WISDOM TOOTH EXTRACTION      Social History   Socioeconomic History  . Marital status: Married    Spouse name: Not on file  . Number of children: Not on file  . Years of education: Not on file  . Highest education level: Not on file  Occupational History  . Not on file  Social Needs  . Financial resource strain: Not on file  . Food insecurity    Worry: Not on file    Inability: Not on file  . Transportation needs    Medical: Not on file    Non-medical: Not on file  Tobacco Use  . Smoking status: Current Every Day Smoker    Packs/day: 0.25    Years: 20.00    Pack years: 5.00    Types: Cigarettes  . Smokeless tobacco: Never Used  . Tobacco comment: 3 cig. per day  Substance and Sexual Activity  . Alcohol use: Yes    Alcohol/week: 2.0 standard drinks    Types: 2 Standard drinks or equivalent per week    Comment: occasional  . Drug use: No  . Sexual activity: Yes    Birth control/protection: Post-menopausal  Lifestyle  . Physical activity    Days per week: Not on file    Minutes per session: Not on file  .  Stress: Not on file  Relationships  . Social Herbalist on phone: Not on file    Gets together: Not on file    Attends religious service: Not on file    Active member of club or organization: Not on file    Attends meetings of clubs or organizations: Not on file    Relationship status: Not on file  . Intimate partner violence    Fear of current or ex partner: Not on file    Emotionally abused: Not on file    Physically abused: Not on file    Forced sexual activity: Not on file  Other Topics Concern  . Not on file  Social History Narrative  . Not on file    Family History  Problem  Relation Age of Onset  . Colon cancer Mother 76  . Breast cancer Mother 86       "BRCA2+"  . Hypertension Father   . Stroke Father   . Alzheimer's disease Father   . Prostate cancer Father   . Breast cancer Maternal Aunt        mat great aunt  . Breast cancer Maternal Grandmother 36  . Pancreatic cancer Maternal Grandfather 60     Current Outpatient Medications:  .  acetaminophen (TYLENOL) 500 MG tablet, Take 500 mg by mouth every 6 (six) hours as needed for mild pain. , Disp: , Rfl:  .  dexamethasone (DECADRON) 4 MG tablet, Take 2 tablets by mouth once a day on the day after chemotherapy and then take 2 tablets two times a day for 2 days. Take with food., Disp: 30 tablet, Rfl: 1 .  gabapentin (NEURONTIN) 300 MG capsule, Take 300 mg by mouth 2 (two) times daily., Disp: , Rfl:  .  lidocaine-prilocaine (EMLA) cream, Apply to affected area once, Disp: 30 g, Rfl: 3 .  lisinopril-hydrochlorothiazide (ZESTORETIC) 20-25 MG tablet, Take 1 tablet by mouth daily., Disp: 90 tablet, Rfl: 3 .  loratadine (CLARITIN) 10 MG tablet, Take 10 mg by mouth daily., Disp: , Rfl:  .  omeprazole (PRILOSEC) 40 MG capsule, Take 1 capsule (40 mg total) by mouth daily., Disp: 90 capsule, Rfl: 1 .  ondansetron (ZOFRAN) 8 MG tablet, Take 1 tablet (8 mg total) by mouth every 8 (eight) hours as needed for nausea or vomiting., Disp: 20 tablet, Rfl: 0 .  prochlorperazine (COMPAZINE) 10 MG tablet, Take 1 tablet (10 mg total) by mouth every 6 (six) hours as needed (Nausea or vomiting)., Disp: 30 tablet, Rfl: 1 .  bisacodyl (DULCOLAX) 5 MG EC tablet, Take 5 mg by mouth daily as needed for mild constipation or moderate constipation., Disp: , Rfl:  .  cetirizine (ZYRTEC) 10 MG tablet, Take 10 mg by mouth daily., Disp: , Rfl:  .  diazepam (VALIUM) 2 MG tablet, Take 1 tablet (2 mg total) by mouth every 12 (twelve) hours as needed for muscle spasms. (Patient not taking: Reported on 02/26/2019), Disp: 20 tablet, Rfl: 0 .   HYDROcodone-acetaminophen (NORCO/VICODIN) 5-325 MG tablet, Take 1 tablet by mouth every 6 (six) hours as needed for moderate pain., Disp: , Rfl:   Physical exam:  Vitals:   03/03/19 0907  BP: 139/89  Pulse: 67  Resp: 18  Temp: 98.2 F (36.8 C)  TempSrc: Tympanic  Weight: 161 lb 4.8 oz (73.2 kg)   Physical Exam Constitutional:      Appearance: She is well-developed.     Comments: Unaccompanied, wearing mask.  HENT:  Head: Atraumatic.     Right Ear: External ear normal.     Left Ear: External ear normal.     Nose: Nose normal. No congestion.     Mouth/Throat:     Mouth: Mucous membranes are moist.     Pharynx: Oropharynx is clear. Posterior oropharyngeal erythema present. No oropharyngeal exudate.  Eyes:     General: No scleral icterus.    Conjunctiva/sclera: Conjunctivae normal.  Neck:     Musculoskeletal: Normal range of motion and neck supple. No neck rigidity or muscular tenderness.  Cardiovascular:     Rate and Rhythm: Normal rate and regular rhythm.  Pulmonary:     Effort: Pulmonary effort is normal.     Breath sounds: Normal breath sounds.  Abdominal:     General: Bowel sounds are normal. There is no distension.     Palpations: Abdomen is soft.  Musculoskeletal: Normal range of motion.  Lymphadenopathy:     Cervical: No cervical adenopathy.  Skin:    General: Skin is warm and dry.  Neurological:     Mental Status: She is alert and oriented to person, place, and time.     Motor: No weakness.  Psychiatric:        Attention and Perception: Attention normal.        Mood and Affect: Mood is anxious. Affect is tearful.      CMP Latest Ref Rng & Units 03/03/2019  Glucose 70 - 99 mg/dL 97  BUN 6 - 20 mg/dL 15  Creatinine 0.44 - 1.00 mg/dL 0.53  Sodium 135 - 145 mmol/L 132(L)  Potassium 3.5 - 5.1 mmol/L 3.3(L)  Chloride 98 - 111 mmol/L 103  CO2 22 - 32 mmol/L 22  Calcium 8.9 - 10.3 mg/dL 8.5(L)  Total Protein 6.5 - 8.1 g/dL 6.6  Total Bilirubin 0.3 - 1.2  mg/dL 0.6  Alkaline Phos 38 - 126 U/L 86  AST 15 - 41 U/L 12(L)  ALT 0 - 44 U/L 21   CBC Latest Ref Rng & Units 03/03/2019  WBC 4.0 - 10.5 K/uL 35.7(H)  Hemoglobin 12.0 - 15.0 g/dL 10.9(L)  Hematocrit 36.0 - 46.0 % 32.6(L)  Platelets 150 - 400 K/uL 328    No images are attached to the encounter.  X-ray Chest Pa Or Ap  Result Date: 02/18/2019 CLINICAL DATA:  53 year old female with Port-A-Cath placement. EXAM: CHEST  1 VIEW COMPARISON:  None. FINDINGS: Left-sided Port-A-Cath with tip at the cavoatrial junction. No pneumothorax. Mild diffuse interstitial prominence. No focal consolidation, or pleural effusion. The cardiac silhouette is within normal limits. No acute osseous pathology. Bilateral breast implants or tissue expanders noted. IMPRESSION: Port-A-Cath with tip at the cavoatrial junction. No pneumothorax. Electronically Signed   By: Anner Crete M.Cheryl.   On: 02/18/2019 13:19   Nm Cardiac Muga Rest  Result Date: 02/11/2019 CLINICAL DATA:  Breast cancer. Evaluate cardiac function in relation to chemotherapy. EXAM: NUCLEAR MEDICINE CARDIAC BLOOD POOL IMAGING (MUGA) TECHNIQUE: Cardiac multi-gated acquisition was performed at rest following intravenous injection of Tc-57mlabeled red blood cells. RADIOPHARMACEUTICALS:  21.8 mCi [id number] in-vitro labeled red blood cells IV COMPARISON:  None. FINDINGS: No  focal wall motion abnormality of the left ventricle. Calculated left ventricular ejection fraction equals 59.3% IMPRESSION: Left ventricular ejection fraction equals59 %. Electronically Signed   By: StSuzy Bouchard.Cheryl.   On: 02/11/2019 16:10   Dg C-arm 1-60 Min-no Report  Result Date: 02/18/2019 Fluoroscopy was utilized by the requesting physician.  No radiographic interpretation.  Assessment and plan- Patient is a 53 y.o. female diagnosed with stage Ib ER, PR positive HER-2/neu negative right breast cancer currently receiving adjuvant chemotherapy who presents to  symptom management clinic for hyponatremia, neuropathic pain, and constipation  1.  Hyponatremia-sodium 132. Improved. Previously 129. We discussed multiple possible etiologies of hyponatremia. Patient concern for interaction between lisinopril and gabapentin.  Okay to hold gabapentin (see below).  Increase oral salt consumption. On lisinopril-hctz combo. Thiazide diuretic may be contributing. See below.  If symptoms persist, consider holding thiazide diuretic vs evaluation of urine sodium and urine osmolality.   2. Hypertension- BP slightly elevated in clinic today. On lisinopril-hctz combo. Encouraged patient to keep a bp diary to monitor her pressures at home. Discussed that some patients taking chemo may need adjustments to bp medications or can hold bp medications as often patients experience fluid volume deficits, weight loss, impaired oral intake, and may experience hypotension.   3. Constipation- likely multifactoral etiology. discussed bowel regimen. Start miralax 1-2 times a day with senna 1 tablet 1-2 times a day as needed with goal of one bm every day to every other day without pain or straining. Encouraged to increase fiber intake. Discussed soluble vs insoluble fiber sources and role in constipation & diarrhea. Uptitrate miralax and senna as needed.   4. Chest wall pain- likely neuropathic component based on symptoms & description. Stop gabapentin. Start cymbalta 30 mg daily to evaluate response. If tolerating plan to increase to 60 mg daily at next visit.   5. Muscle Spams- continue diazepam 2 mg every 12 hours as needed for muscle spasms, anxiety, and insomnia.    Disposition:  RTC in 1 week for re-evaluation virtually. RTC sooner if symptoms persist or do not improve  Visit Diagnosis 1. Hyponatremia   2. Hypertension, unspecified type   3. Constipation, unspecified constipation type   4. Chest wall pain following surgery   5. Muscle spasm     Patient expressed understanding  and was in agreement with this plan. She also understands that She can call clinic at any time with any questions, concerns, or complaints.   A total of (40) minutes of face-to-face time was spent with this patient with greater than 50% of that time in counseling and care-coordination.  Thank you for allowing me to participate in the care of this very pleasant patient.   Beckey Rutter, DNP, AGNP-C [place] at [facility] [phone] (clinic)  CC: Dr. Tasia Catchings

## 2019-03-08 DIAGNOSIS — R0789 Other chest pain: Secondary | ICD-10-CM | POA: Insufficient documentation

## 2019-03-08 DIAGNOSIS — E871 Hypo-osmolality and hyponatremia: Secondary | ICD-10-CM | POA: Insufficient documentation

## 2019-03-08 DIAGNOSIS — K5900 Constipation, unspecified: Secondary | ICD-10-CM | POA: Insufficient documentation

## 2019-03-08 DIAGNOSIS — G8918 Other acute postprocedural pain: Secondary | ICD-10-CM | POA: Insufficient documentation

## 2019-03-09 ENCOUNTER — Inpatient Hospital Stay (HOSPITAL_BASED_OUTPATIENT_CLINIC_OR_DEPARTMENT_OTHER): Payer: BC Managed Care – PPO | Admitting: Nurse Practitioner

## 2019-03-09 ENCOUNTER — Encounter: Payer: Self-pay | Admitting: Nurse Practitioner

## 2019-03-09 ENCOUNTER — Inpatient Hospital Stay: Payer: BC Managed Care – PPO | Attending: Oncology | Admitting: *Deleted

## 2019-03-09 ENCOUNTER — Ambulatory Visit: Payer: BC Managed Care – PPO | Admitting: Surgical

## 2019-03-09 ENCOUNTER — Other Ambulatory Visit: Payer: Self-pay

## 2019-03-09 VITALS — BP 128/82 | HR 69 | Temp 97.5°F | Resp 16 | Wt 157.0 lb

## 2019-03-09 DIAGNOSIS — I10 Essential (primary) hypertension: Secondary | ICD-10-CM | POA: Diagnosis not present

## 2019-03-09 DIAGNOSIS — Z79899 Other long term (current) drug therapy: Secondary | ICD-10-CM | POA: Insufficient documentation

## 2019-03-09 DIAGNOSIS — Z8249 Family history of ischemic heart disease and other diseases of the circulatory system: Secondary | ICD-10-CM | POA: Insufficient documentation

## 2019-03-09 DIAGNOSIS — Z801 Family history of malignant neoplasm of trachea, bronchus and lung: Secondary | ICD-10-CM | POA: Diagnosis not present

## 2019-03-09 DIAGNOSIS — K5900 Constipation, unspecified: Secondary | ICD-10-CM | POA: Diagnosis not present

## 2019-03-09 DIAGNOSIS — R221 Localized swelling, mass and lump, neck: Secondary | ICD-10-CM | POA: Insufficient documentation

## 2019-03-09 DIAGNOSIS — Z5189 Encounter for other specified aftercare: Secondary | ICD-10-CM | POA: Insufficient documentation

## 2019-03-09 DIAGNOSIS — R5383 Other fatigue: Secondary | ICD-10-CM | POA: Diagnosis not present

## 2019-03-09 DIAGNOSIS — R0789 Other chest pain: Secondary | ICD-10-CM | POA: Insufficient documentation

## 2019-03-09 DIAGNOSIS — F17210 Nicotine dependence, cigarettes, uncomplicated: Secondary | ICD-10-CM | POA: Diagnosis not present

## 2019-03-09 DIAGNOSIS — E871 Hypo-osmolality and hyponatremia: Secondary | ICD-10-CM

## 2019-03-09 DIAGNOSIS — K219 Gastro-esophageal reflux disease without esophagitis: Secondary | ICD-10-CM | POA: Diagnosis not present

## 2019-03-09 DIAGNOSIS — C50511 Malignant neoplasm of lower-outer quadrant of right female breast: Secondary | ICD-10-CM | POA: Diagnosis not present

## 2019-03-09 DIAGNOSIS — K1379 Other lesions of oral mucosa: Secondary | ICD-10-CM

## 2019-03-09 DIAGNOSIS — R197 Diarrhea, unspecified: Secondary | ICD-10-CM | POA: Diagnosis not present

## 2019-03-09 DIAGNOSIS — G8918 Other acute postprocedural pain: Secondary | ICD-10-CM

## 2019-03-09 DIAGNOSIS — Z800 Family history of malignant neoplasm of digestive organs: Secondary | ICD-10-CM | POA: Diagnosis not present

## 2019-03-09 DIAGNOSIS — Z5111 Encounter for antineoplastic chemotherapy: Secondary | ICD-10-CM | POA: Insufficient documentation

## 2019-03-09 DIAGNOSIS — E876 Hypokalemia: Secondary | ICD-10-CM | POA: Diagnosis not present

## 2019-03-09 DIAGNOSIS — Z170 Estrogen receptor positive status [ER+]: Secondary | ICD-10-CM | POA: Insufficient documentation

## 2019-03-09 DIAGNOSIS — Z803 Family history of malignant neoplasm of breast: Secondary | ICD-10-CM | POA: Insufficient documentation

## 2019-03-09 DIAGNOSIS — Z95828 Presence of other vascular implants and grafts: Secondary | ICD-10-CM

## 2019-03-09 LAB — COMPREHENSIVE METABOLIC PANEL
ALT: 12 U/L (ref 0–44)
AST: 13 U/L — ABNORMAL LOW (ref 15–41)
Albumin: 4.3 g/dL (ref 3.5–5.0)
Alkaline Phosphatase: 114 U/L (ref 38–126)
Anion gap: 8 (ref 5–15)
BUN: 9 mg/dL (ref 6–20)
CO2: 24 mmol/L (ref 22–32)
Calcium: 9.1 mg/dL (ref 8.9–10.3)
Chloride: 101 mmol/L (ref 98–111)
Creatinine, Ser: 0.56 mg/dL (ref 0.44–1.00)
GFR calc Af Amer: >60 mL/min (ref >60)
GFR calc non Af Amer: >60 mL/min (ref >60)
Glucose, Bld: 118 mg/dL — ABNORMAL HIGH (ref 70–99)
Potassium: 3.6 mmol/L (ref 3.5–5.1)
Sodium: 133 mmol/L — ABNORMAL LOW (ref 135–145)
Total Bilirubin: 0.3 mg/dL (ref 0.3–1.2)
Total Protein: 7.2 g/dL (ref 6.5–8.1)

## 2019-03-09 LAB — CBC WITH DIFFERENTIAL/PLATELET
Abs Immature Granulocytes: 4.08 10*3/uL — ABNORMAL HIGH (ref 0.00–0.07)
Basophils Absolute: 0.1 10*3/uL (ref 0.0–0.1)
Basophils Relative: 0 %
Eosinophils Absolute: 0.1 10*3/uL (ref 0.0–0.5)
Eosinophils Relative: 0 %
HCT: 35.3 % — ABNORMAL LOW (ref 36.0–46.0)
Hemoglobin: 11.5 g/dL — ABNORMAL LOW (ref 12.0–15.0)
Immature Granulocytes: 15 %
Lymphocytes Relative: 15 %
Lymphs Abs: 4 10*3/uL (ref 0.7–4.0)
MCH: 29.6 pg (ref 26.0–34.0)
MCHC: 32.6 g/dL (ref 30.0–36.0)
MCV: 91 fL (ref 80.0–100.0)
Monocytes Absolute: 1.9 10*3/uL — ABNORMAL HIGH (ref 0.1–1.0)
Monocytes Relative: 7 %
Neutro Abs: 16.3 10*3/uL — ABNORMAL HIGH (ref 1.7–7.7)
Neutrophils Relative %: 63 %
Platelets: 251 10*3/uL (ref 150–400)
RBC: 3.88 MIL/uL (ref 3.87–5.11)
RDW: 12.8 % (ref 11.5–15.5)
WBC: 26.4 10*3/uL — ABNORMAL HIGH (ref 4.0–10.5)
nRBC: 0.1 % (ref 0.0–0.2)

## 2019-03-09 MED ORDER — SODIUM CHLORIDE 0.9% FLUSH
10.0000 mL | Freq: Once | INTRAVENOUS | Status: AC
  Administered 2019-03-09: 10 mL via INTRAVENOUS
  Filled 2019-03-09: qty 10

## 2019-03-09 MED ORDER — DULOXETINE HCL 60 MG PO CPEP: 60.0000 mg | ORAL_CAPSULE | Freq: Every day | ORAL | 3 refills | Status: DC

## 2019-03-09 MED ORDER — HEPARIN SOD (PORK) LOCK FLUSH 100 UNIT/ML IV SOLN: 500.0000 [IU] | Freq: Once | INTRAVENOUS | Status: DC

## 2019-03-09 NOTE — Progress Notes (Signed)
[facility]pt[facility]  [phone] [phone]) [phone]  Patient Care Team: Cheryl Hess, MD as PCP - General (Internal Medicine) Cheryl Mantis, MD (Dermatology)   Name of the patient: Cheryl Mooney  [id number]  June 30, 1965   Date of visit: 03/09/19  Diagnosis-breast cancer  Chief complaint/ Reason for visit-pain, hyponatremia  Heme/Onc history:  Oncology History  Malignant neoplasm of lower-outer quadrant of right breast of female, estrogen receptor positive (Cheryl Mooney)  12/17/2018 Initial Diagnosis   Malignant neoplasm of lower-outer quadrant of right breast of female, estrogen receptor positive ([place])   01/21/2019 Cancer Staging   Staging form: Breast, AJCC 8th Edition - Pathologic stage from 01/21/2019: Stage IB (pT2, pN0, cM0, G3, ER+, PR+, HER2-) - Signed by Cheryl Server, MD on 01/22/2019   02/26/2019 -  Chemotherapy   The patient had DOXOrubicin (ADRIAMYCIN) chemo injection 100 mg, 110 mg, Intravenous,  Once, 1 of 4 cycles Administration: 100 mg (02/26/2019) palonosetron (ALOXI) injection 0.25 mg, 0.25 mg, Intravenous,  Once, 1 of 4 cycles Administration: 0.25 mg (02/26/2019) pegfilgrastim-jmdb (FULPHILA) injection 6 mg, 6 mg, Subcutaneous,  Once, 1 of 4 cycles Administration: 6 mg (03/01/2019) cyclophosphamide (CYTOXAN) 1,000 mg in sodium chloride 0.9 % 250 mL chemo infusion, 1,100 mg, Intravenous,  Once, 1 of 4 cycles Administration: 1,000 mg (02/26/2019) PACLitaxel (TAXOL) 144 mg in sodium chloride 0.9 % 250 mL chemo infusion (</= 52m/m2), 80 mg/m2, Intravenous,  Once, 0 of 12 cycles fosaprepitant (EMEND) 150 mg, dexamethasone (DECADRON) 12 mg in sodium chloride 0.9 % 145 mL IVPB, , Intravenous,  Once, 1 of 4 cycles Administration:  (02/26/2019)  for chemotherapy treatment.      Interval history- Cheryl Mooney 53year old female with above history of breast cancer, currently status post adjuvant chemotherapy  with Adriamycin and Cytoxan, who returns to symptom management clinic for follow-up for hyponatremia, hypertension, constipation, and neuropathic-like pain of the chest wall postmastectomy. At last visit, gabapentin was held due to possible interaction with lisinopril HCTZ and lack of effect.  She was started on Cymbalta.  She has been monitoring her blood pressures at home and has continued taking her blood pressure medication per PCP.  She experienced some constipation around Thanksgiving which resolved with MiraLAX.  She has been taking Magic mouthwash for mouth sores Today, she says that she feels significantly improved, nervelike pain has improved significantly.  Constipation has resolved.  Neck swelling and fatigue has improved.  She says overall she feels much better and more like herself.  Mouth sores have resolved and she is eating and drinking more normally.  She denies dizziness or weakness.  No fevers or chills.  No easy bleeding or bruising.  Denies chest pain, nausea, vomiting, diarrhea.  Denies urinary complaints.  ECOG FS:0 - Asymptomatic  Review of systems- Review of Systems  Constitutional: Negative for chills, fever, malaise/fatigue and weight loss.  HENT: Negative for congestion, ear pain, hearing loss, nosebleeds, sore throat and tinnitus.   Eyes: Negative for blurred vision and double vision.  Respiratory: Negative for cough, hemoptysis, shortness of breath and wheezing.   Cardiovascular: Negative for chest pain, palpitations and leg swelling.  Gastrointestinal: Negative for abdominal pain, blood in stool, constipation, diarrhea, melena, nausea and vomiting.  Genitourinary: Negative for dysuria and urgency.  Musculoskeletal: Negative for back pain, falls, joint pain and myalgias.  Skin: Negative for itching and rash.  Neurological: Positive for sensory change (Chest wall-improved). Negative for dizziness, tingling, loss of consciousness, weakness and headaches.   Endo/Heme/Allergies:  Negative for environmental allergies. Does not bruise/bleed easily.  Psychiatric/Behavioral: Negative for depression. The patient is not nervous/anxious and does not have insomnia.     Current treatment-adjuvant Adriamycin and Cytoxan followed by weekly Taxol  No Known Allergies  Past Medical History:  Diagnosis Date  . Benign neoplasm of sigmoid colon   . Benign neoplasm of transverse colon   . Breast cancer ([place])   . Family history of BRCA2 gene positive   . Family history of breast cancer   . Family history of colon cancer   . Family history of lung cancer   . Family history of pancreatic cancer   . GERD (gastroesophageal reflux disease)   . History of kidney stones   . Hypertension   . Malignant neoplasm of lower-outer quadrant of right breast of female, estrogen receptor positive ([place]) 12/17/2018  . Smoker     Past Surgical History:  Procedure Laterality Date  . AXILLARY SENTINEL NODE BIOPSY Bilateral 01/14/2019   Procedure: AXILLARY SENTINEL NODE BIOPSY;  Surgeon: Cheryl Pun, MD;  Location: [facility];  Service: General;  Laterality: Bilateral;  . BREAST BIOPSY Right 12/08/2018   [place] bx of mass with calcs path pending  . BREAST BIOPSY Right 12/08/2018   [place] bx of LN, path pending  . BREAST RECONSTRUCTION WITH PLACEMENT OF TISSUE EXPANDER AND ALLODERM Bilateral 01/14/2019   Procedure: BILATERAL BREAST RECONSTRUCTION WITH PLACEMENT OF TISSUE EXPANDER AND FLEX HD;  Surgeon: Cheryl Going, DO;  Location: [facility];  Service: Plastics;  Laterality: Bilateral;  . COLONOSCOPY WITH PROPOFOL N/A 02/17/2015   Procedure: COLONOSCOPY WITH PROPOFOL;  Surgeon: Cheryl Lame, MD;  Location: [facility];  Service: Endoscopy;  Laterality: N/A;  . POLYPECTOMY  02/17/2015   Procedure: POLYPECTOMY;  Surgeon: Cheryl Lame, MD;  Location: [facility];  Service: Endoscopy;;  . PORTACATH PLACEMENT Left 02/18/2019   Procedure: INSERTION PORT-A-CATH;   Surgeon: Cheryl Pun, MD;  Location: [facility];  Service: General;  Laterality: Left;  . SIMPLE MASTECTOMY WITH AXILLARY SENTINEL NODE BIOPSY Left 01/14/2019   Procedure: LEFT SIMPLE MASTECTOMY;  Surgeon: Cheryl Pun, MD;  Location: [facility];  Service: General;  Laterality: Left;  . TOTAL MASTECTOMY Right 01/14/2019   Procedure: RIGHT TOTAL MASTECTOMY;  Surgeon: Cheryl Pun, MD;  Location: [facility];  Service: General;  Laterality: Right;  . WISDOM TOOTH EXTRACTION      Social History   Socioeconomic History  . Marital status: Married    Spouse name: Not on file  . Number of children: Not on file  . Years of education: Not on file  . Highest education level: Not on file  Occupational History  . Not on file  Social Needs  . Financial resource strain: Not on file  . Food insecurity    Worry: Not on file    Inability: Not on file  . Transportation needs    Medical: Not on file    Non-medical: Not on file  Tobacco Use  . Smoking status: Current Every Day Smoker    Packs/day: 0.25    Years: 20.00    Pack years: 5.00    Types: Cigarettes  . Smokeless tobacco: Never Used  . Tobacco comment: 3 cig. per day  Substance and Sexual Activity  . Alcohol use: Yes    Alcohol/week: 2.0 standard drinks    Types: 2 Standard drinks or equivalent per week    Comment: occasional  . Drug use: No  . Sexual activity: Yes    Birth  control/protection: Post-menopausal  Lifestyle  . Physical activity    Days per week: Not on file    Minutes per session: Not on file  . Stress: Not on file  Relationships  . Social Herbalist on phone: Not on file    Gets together: Not on file    Attends religious service: Not on file    Active member of club or organization: Not on file    Attends meetings of clubs or organizations: Not on file    Relationship status: Not on file  . Intimate partner violence    Fear of current or ex partner: Not on file    Emotionally  abused: Not on file    Physically abused: Not on file    Forced sexual activity: Not on file  Other Topics Concern  . Not on file  Social History Narrative  . Not on file    Family History  Problem Relation Age of Onset  . Colon cancer Mother 76  . Breast cancer Mother 26       "BRCA2+"  . Hypertension Father   . Stroke Father   . Alzheimer's disease Father   . Prostate cancer Father   . Breast cancer Maternal Aunt        mat great aunt  . Breast cancer Maternal Grandmother 36  . Pancreatic cancer Maternal Grandfather 60     Current Outpatient Medications:  .  acetaminophen (TYLENOL) 500 MG tablet, Take 500 mg by mouth every 6 (six) hours as needed for mild pain. , Disp: , Rfl:  .  bisacodyl (DULCOLAX) 5 MG EC tablet, Take 5 mg by mouth daily as needed for mild constipation or moderate constipation., Disp: , Rfl:  .  cetirizine (ZYRTEC) 10 MG tablet, Take 10 mg by mouth daily., Disp: , Rfl:  .  dexamethasone (DECADRON) 4 MG tablet, Take 2 tablets by mouth once a day on the day after chemotherapy and then take 2 tablets two times a day for 2 days. Take with food., Disp: 30 tablet, Rfl: 1 .  diazepam (VALIUM) 2 MG tablet, Take 1 tablet (2 mg total) by mouth every 12 (twelve) hours as needed for muscle spasms., Disp: 20 tablet, Rfl: 0 .  lidocaine-prilocaine (EMLA) cream, Apply to affected area once, Disp: 30 g, Rfl: 3 .  lisinopril-hydrochlorothiazide (ZESTORETIC) 20-25 MG tablet, Take 1 tablet by mouth daily., Disp: 90 tablet, Rfl: 3 .  loratadine (CLARITIN) 10 MG tablet, Take 10 mg by mouth daily., Disp: , Rfl:  .  omeprazole (PRILOSEC) 40 MG capsule, Take 1 capsule (40 mg total) by mouth daily., Disp: 90 capsule, Rfl: 1 .  ondansetron (ZOFRAN) 8 MG tablet, Take 1 tablet (8 mg total) by mouth every 8 (eight) hours as needed for nausea or vomiting., Disp: 20 tablet, Rfl: 0 .  prochlorperazine (COMPAZINE) 10 MG tablet, Take 1 tablet (10 mg total) by mouth every 6 (six) hours as  needed (Nausea or vomiting)., Disp: 30 tablet, Rfl: 1 .  DULoxetine (CYMBALTA) 60 MG capsule, Take 1 capsule (60 mg total) by mouth daily., Disp: 90 capsule, Rfl: 3 .  HYDROcodone-acetaminophen (NORCO/VICODIN) 5-325 MG tablet, Take 1 tablet by mouth every 6 (six) hours as needed for moderate pain., Disp: , Rfl:  No current facility-administered medications for this visit.   Facility-Administered Medications Ordered in Other Visits:  .  heparin lock flush 100 unit/mL, 500 Units, Intravenous, Once, Verlon Au, NP  Physical exam:  Vitals:  03/09/19 1044  BP: 128/82  Pulse: 69  Resp: 16  Temp: (!) 97.5 F (36.4 C)  TempSrc: Tympanic  Weight: 157 lb (71.2 kg)   Physical Exam Constitutional:      General: She is not in acute distress.    Appearance: She is well-developed.     Comments: Unaccompanied, wearing mask.  HENT:     Nose: Nose normal. No congestion.     Mouth/Throat:     Pharynx: No oropharyngeal exudate or posterior oropharyngeal erythema.  Eyes:     General: No scleral icterus.    Conjunctiva/sclera: Conjunctivae normal.  Neck:     Musculoskeletal: Normal range of motion and neck supple. No neck rigidity or muscular tenderness.  Cardiovascular:     Rate and Rhythm: Normal rate and regular rhythm.  Pulmonary:     Effort: Pulmonary effort is normal.     Breath sounds: Normal breath sounds.  Abdominal:     General: Bowel sounds are normal. There is no distension.     Palpations: Abdomen is soft.  Musculoskeletal:        General: No deformity.     Comments: No ambulatory aids  Lymphadenopathy:     Cervical: No cervical adenopathy.  Skin:    General: Skin is warm and dry.  Neurological:     Mental Status: She is alert and oriented to person, place, and time.     Motor: No weakness.  Psychiatric:        Attention and Perception: Attention normal.        Mood and Affect: Mood is anxious. Affect is tearful.        Behavior: Behavior normal.        Thought  Content: Thought content normal.      CMP Latest Ref Rng & Units 03/09/2019  Glucose 70 - 99 mg/dL 118(H)  BUN 6 - 20 mg/dL 9  Creatinine 0.44 - 1.00 mg/dL 0.56  Sodium 135 - 145 mmol/L 133(L)  Potassium 3.5 - 5.1 mmol/L 3.6  Chloride 98 - 111 mmol/L 101  CO2 22 - 32 mmol/L 24  Calcium 8.9 - 10.3 mg/dL 9.1  Total Protein 6.5 - 8.1 g/dL 7.2  Total Bilirubin 0.3 - 1.2 mg/dL 0.3  Alkaline Phos 38 - 126 U/L 114  AST 15 - 41 U/L 13(L)  ALT 0 - 44 U/L 12   CBC Latest Ref Rng & Units 03/09/2019  WBC 4.0 - 10.5 K/uL 26.4(H)  Hemoglobin 12.0 - 15.0 g/dL 11.5(L)  Hematocrit 36.0 - 46.0 % 35.3(L)  Platelets 150 - 400 K/uL 251    No images are attached to the encounter.  X-ray Chest Pa Or Ap  Result Date: 02/18/2019 CLINICAL DATA:  53 year old female with Port-A-Cath placement. EXAM: CHEST  1 VIEW COMPARISON:  None. FINDINGS: Left-sided Port-A-Cath with tip at the cavoatrial junction. No pneumothorax. Mild diffuse interstitial prominence. No focal consolidation, or pleural effusion. The cardiac silhouette is within normal limits. No acute osseous pathology. Bilateral breast implants or tissue expanders noted. IMPRESSION: Port-A-Cath with tip at the cavoatrial junction. No pneumothorax. Electronically Signed   By: Anner Crete M.D.   On: 02/18/2019 13:19   Nm Cardiac Muga Rest  Result Date: 02/11/2019 CLINICAL DATA:  Breast cancer. Evaluate cardiac function in relation to chemotherapy. EXAM: NUCLEAR MEDICINE CARDIAC BLOOD POOL IMAGING (MUGA) TECHNIQUE: Cardiac multi-gated acquisition was performed at rest following intravenous injection of Tc-21mlabeled red blood cells. RADIOPHARMACEUTICALS:  21.8 mCi Tc-966mertechnetate in-vitro labeled red blood cells IV  COMPARISON:  None. FINDINGS: No  focal wall motion abnormality of the left ventricle. Calculated left ventricular ejection fraction equals 59.3% IMPRESSION: Left ventricular ejection fraction equals59 %. Electronically Signed   By:  Suzy Bouchard M.D.   On: 02/11/2019 16:10   Dg C-arm 1-60 Min-no Report  Result Date: 02/18/2019 Fluoroscopy was utilized by the requesting physician.  No radiographic interpretation.    Assessment and plan- Patient is a 53 y.o. female diagnosed with stage Ib ER, PR positive HER-2/neu negative right breast cancer currently receiving adjuvant chemotherapy who presents to symptom management clinic for hyponatremia, neuropathic pain, constipation, hypertension, and mouth sores.  1.  Hyponatremia-sodium 133.  Continues to improve.  Continue to hold gabapentin for possible interaction between lisinopril-HCTZ and gabapentin.  Thiazide diuretic may also be contributing.  Continue to monitor.  If symptoms persist, consider holding thiazide diuretic versus evaluation of urine sodium and urine osmolality.  2.  Hypertension-BP significantly improved.  Journal of blood pressures from home reviewed today and all within normal limits without low blood pressures.  Continue lisinopril HCTZ combo.  Again discussed that patient is taking chemotherapy may need adjustments to blood pressure medications for temporarily holding blood pressure medications as patients often experience fluid volume deficits, weight loss, impaired oral intake, and may experience hypotension.  We discussed signs and symptoms of hypotension and I encouraged her to continue to monitor blood pressure at home.  3.  Constipation-resolved.  Continue MiraLAX and senna with Colace 1 bowel movement every day to every other day without pain or straining.  Continue to encourage increased fiber intake, fluid intake, and physical activity as tolerated  4.  Chest wall pain-postsurgical.  Likely neuropathic component based on symptoms.  Gabapentin stopped.  Tolerating Cymbalta 30 mg well.  Increase to Cymbalta 60 mg daily.  5.  Mouth sores-resolved with Magic mouthwash.  Continue as needed.   Disposition:  Return to clinic as needed.  Continue to  follow-up with Dr. Tasia Catchings as scheduled.  Visit Diagnosis 1. Chest wall pain following surgery   2. Hyponatremia   3. Hypertension, unspecified type   4. Constipation, unspecified constipation type   5. Mouth sores    Patient expressed understanding and was in agreement with this plan. She also understands that She can call clinic at any time with any questions, concerns, or complaints.   A total of (15) minutes of face-to-face time was spent with this patient with greater than 50% of that time in counseling and care-coordination.  Thank you for allowing me to participate in the care of this very pleasant patient.   Beckey Rutter, DNP, AGNP-C [place] [facility] [phone] (clinic)  CC: Dr. Tasia Catchings

## 2019-03-10 ENCOUNTER — Encounter: Payer: Self-pay | Admitting: Nurse Practitioner

## 2019-03-11 ENCOUNTER — Other Ambulatory Visit: Payer: Self-pay

## 2019-03-11 ENCOUNTER — Encounter: Payer: Self-pay | Admitting: Nurse Practitioner

## 2019-03-11 ENCOUNTER — Encounter: Payer: Self-pay | Admitting: Oncology

## 2019-03-11 NOTE — Progress Notes (Signed)
Patient prescreened for appointment. Pt had some redness around port on Monday, but she believes it was due to the tape that it was covered with.

## 2019-03-12 ENCOUNTER — Inpatient Hospital Stay: Payer: BC Managed Care – PPO | Admitting: *Deleted

## 2019-03-12 ENCOUNTER — Inpatient Hospital Stay (HOSPITAL_BASED_OUTPATIENT_CLINIC_OR_DEPARTMENT_OTHER): Payer: BC Managed Care – PPO | Admitting: Oncology

## 2019-03-12 ENCOUNTER — Inpatient Hospital Stay: Payer: BC Managed Care – PPO

## 2019-03-12 ENCOUNTER — Other Ambulatory Visit: Payer: Self-pay

## 2019-03-12 VITALS — BP 114/79 | HR 78 | Temp 97.0°F | Resp 16 | Wt 154.8 lb

## 2019-03-12 DIAGNOSIS — Z95828 Presence of other vascular implants and grafts: Secondary | ICD-10-CM

## 2019-03-12 DIAGNOSIS — C50919 Malignant neoplasm of unspecified site of unspecified female breast: Secondary | ICD-10-CM

## 2019-03-12 DIAGNOSIS — Z170 Estrogen receptor positive status [ER+]: Secondary | ICD-10-CM

## 2019-03-12 DIAGNOSIS — Z5111 Encounter for antineoplastic chemotherapy: Secondary | ICD-10-CM

## 2019-03-12 DIAGNOSIS — C50511 Malignant neoplasm of lower-outer quadrant of right female breast: Secondary | ICD-10-CM | POA: Diagnosis not present

## 2019-03-12 DIAGNOSIS — Z7189 Other specified counseling: Secondary | ICD-10-CM

## 2019-03-12 DIAGNOSIS — E871 Hypo-osmolality and hyponatremia: Secondary | ICD-10-CM | POA: Diagnosis not present

## 2019-03-12 DIAGNOSIS — Z1502 Genetic susceptibility to malignant neoplasm of ovary: Secondary | ICD-10-CM

## 2019-03-12 DIAGNOSIS — Z1501 Genetic susceptibility to malignant neoplasm of breast: Secondary | ICD-10-CM | POA: Diagnosis not present

## 2019-03-12 DIAGNOSIS — Z1509 Genetic susceptibility to other malignant neoplasm: Secondary | ICD-10-CM

## 2019-03-12 LAB — COMPREHENSIVE METABOLIC PANEL
ALT: 14 U/L (ref 0–44)
AST: 14 U/L — ABNORMAL LOW (ref 15–41)
Albumin: 4.3 g/dL (ref 3.5–5.0)
Alkaline Phosphatase: 120 U/L (ref 38–126)
Anion gap: 7 (ref 5–15)
BUN: 10 mg/dL (ref 6–20)
CO2: 26 mmol/L (ref 22–32)
Calcium: 9.3 mg/dL (ref 8.9–10.3)
Chloride: 102 mmol/L (ref 98–111)
Creatinine, Ser: 0.56 mg/dL (ref 0.44–1.00)
GFR calc Af Amer: >60 mL/min (ref >60)
GFR calc non Af Amer: >60 mL/min (ref >60)
Glucose, Bld: 119 mg/dL — ABNORMAL HIGH (ref 70–99)
Potassium: 3.7 mmol/L (ref 3.5–5.1)
Sodium: 135 mmol/L (ref 135–145)
Total Bilirubin: 0.3 mg/dL (ref 0.3–1.2)
Total Protein: 7.2 g/dL (ref 6.5–8.1)

## 2019-03-12 LAB — CBC WITH DIFFERENTIAL/PLATELET
Abs Immature Granulocytes: 1.34 10*3/uL — ABNORMAL HIGH (ref 0.00–0.07)
Basophils Absolute: 0.2 10*3/uL — ABNORMAL HIGH (ref 0.0–0.1)
Basophils Relative: 1 %
Eosinophils Absolute: 0.1 10*3/uL (ref 0.0–0.5)
Eosinophils Relative: 1 %
HCT: 37.1 % (ref 36.0–46.0)
Hemoglobin: 12 g/dL (ref 12.0–15.0)
Immature Granulocytes: 8 %
Lymphocytes Relative: 20 %
Lymphs Abs: 3.3 10*3/uL (ref 0.7–4.0)
MCH: 29.5 pg (ref 26.0–34.0)
MCHC: 32.3 g/dL (ref 30.0–36.0)
MCV: 91.2 fL (ref 80.0–100.0)
Monocytes Absolute: 1 10*3/uL (ref 0.1–1.0)
Monocytes Relative: 6 %
Neutro Abs: 10.6 10*3/uL — ABNORMAL HIGH (ref 1.7–7.7)
Neutrophils Relative %: 64 %
Platelets: 224 10*3/uL (ref 150–400)
RBC: 4.07 MIL/uL (ref 3.87–5.11)
RDW: 12.9 % (ref 11.5–15.5)
WBC: 16.4 10*3/uL — ABNORMAL HIGH (ref 4.0–10.5)
nRBC: 0.2 % (ref 0.0–0.2)

## 2019-03-12 MED ORDER — HEPARIN SOD (PORK) LOCK FLUSH 100 UNIT/ML IV SOLN
500.0000 [IU] | Freq: Once | INTRAVENOUS | Status: AC
  Administered 2019-03-12: 500 [IU] via INTRAVENOUS
  Filled 2019-03-12: qty 5

## 2019-03-12 MED ORDER — SODIUM CHLORIDE 0.9% FLUSH
10.0000 mL | Freq: Once | INTRAVENOUS | Status: AC
  Administered 2019-03-12: 10 mL via INTRAVENOUS
  Filled 2019-03-12: qty 10

## 2019-03-14 MED ORDER — DEXAMETHASONE 4 MG PO TABS: 8.0000 mg | ORAL_TABLET | Freq: Two times a day (BID) | ORAL | 1 refills | Status: DC

## 2019-03-14 MED ORDER — ONDANSETRON HCL 8 MG PO TABS: 8.0000 mg | ORAL_TABLET | Freq: Two times a day (BID) | ORAL | 1 refills | Status: DC | PRN

## 2019-03-14 MED ORDER — PROCHLORPERAZINE MALEATE 10 MG PO TABS: 10.0000 mg | ORAL_TABLET | Freq: Four times a day (QID) | ORAL | 1 refills | Status: DC | PRN

## 2019-03-14 NOTE — Progress Notes (Signed)
DISCONTINUE ON PATHWAY REGIMEN - Breast  No Medical Intervention - Off Treatment.  PRIOR TREATMENT: Off Treatment  Breast - No Medical Intervention - Off Treatment.  Patient Characteristics: Postoperative without Neoadjuvant Therapy (Pathologic Staging), Invasive Disease, Adjuvant Therapy, HER2 Negative/Unknown/Equivocal, ER Positive, Node Negative, pT1a-c, pN0/N108m or pT2 or Higher, pN0, Oncotype High Risk (? 26) Therapeutic Status: Postoperative without Neoadjuvant Therapy (Pathologic Staging) AJCC Grade: G3 AJCC N Category: pN0 AJCC M Category: cM0 ER Status: Positive (+) AJCC 8 Stage Grouping: IB HER2 Status: Negative (-) Oncotype Dx Recurrence Score: 31 AJCC T Category: pT2 PR Status: Positive (+) Has this patient completed genomic testing<= Yes - Oncotype DX(R)

## 2019-03-14 NOTE — Progress Notes (Signed)
DISCONTINUE ON PATHWAY REGIMEN - Breast     A cycle is every 21 days:     Docetaxel      Cyclophosphamide   **Always confirm dose/schedule in your pharmacy ordering system**  REASON: Other Reason PRIOR TREATMENT: [id number]: TC - Docetaxel + Cyclophosphamide q21 Days x 4 Cycles  Breast - No Medical Intervention - Off Treatment.  Patient Characteristics: Postoperative without Neoadjuvant Therapy (Pathologic Staging), Invasive Disease, Adjuvant Therapy, HER2 Negative/Unknown/Equivocal, ER Positive, Node Negative, pT1a-c, pN0/N61m or pT2 or Higher, pN0, Oncotype High Risk (? 26) Therapeutic Status: Postoperative without Neoadjuvant Therapy (Pathologic Staging) AJCC Grade: G3 AJCC N Category: pN0 AJCC M Category: cM0 ER Status: Positive (+) AJCC 8 Stage Grouping: IB HER2 Status: Negative (-) Oncotype Dx Recurrence Score: 31 AJCC T Category: pT2 PR Status: Positive (+) Has this patient completed genomic testing<= Yes - Oncotype DX(R)

## 2019-03-14 NOTE — Progress Notes (Signed)
DISCONTINUE ON PATHWAY REGIMEN - Breast  No Medical Intervention - Off Treatment.  REASON: Other Reason PRIOR TREATMENT: Off Treatment  Breast - No Medical Intervention - Off Treatment.  Patient Characteristics: Postoperative without Neoadjuvant Therapy (Pathologic Staging), Invasive Disease, Adjuvant Therapy, HER2 Negative/Unknown/Equivocal, ER Positive, Node Negative, pT1a-c, pN0/N44m or pT2 or Higher, pN0, Oncotype High Risk (? 26) Therapeutic Status: Postoperative without Neoadjuvant Therapy (Pathologic Staging) AJCC Grade: G3 AJCC N Category: pN0 AJCC M Category: cM0 ER Status: Positive (+) AJCC 8 Stage Grouping: IB HER2 Status: Negative (-) Oncotype Dx Recurrence Score: 31 AJCC T Category: pT2 PR Status: Positive (+) Has this patient completed genomic testing<= Yes - Oncotype DX(R)

## 2019-03-14 NOTE — Progress Notes (Signed)
Hematology/Oncology Consult note [facility] Telephone:([phone] [phone]   Patient Care Team: Glean Hess, MD as PCP - General (Internal Medicine) Jannet Mantis, MD (Dermatology)  CHIEF COMPLAINTS/REASON FOR VISIT:  Follow-up for breast cancer.  HISTORY OF PRESENTING ILLNESS:  Cheryl Mooney is a  53 y.o.  female with PMH listed below who was referred to me for evaluation of breast cancer Patient reports feeling mass of her right breast week before her annual mammogram. 12/03/2018 patient had bilateral diagnostic mammogram and ultrasound  which showed suspicious 2.1 x 1.3 x 1.6 irregular mass at 7:00 in the right breast, 2 cm from the nipple. 2 borderline lymph nodes are seen in the right axilla.  1 of the nodes demonstrate a cortex of 4.4 mm.  Both lymph nodes demonstrated retention of fatty hila. Patient underwent ultrasound-guided biopsy of right breast mass as well as ultrasound-guided biopsy of 1 of the 2 mildly abnormal right axillary lymph nodes. 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 Right axilla lymph node negative for malignancy. ER> 90% positive, PR11-50% positive, HER-2 equivocal, 2+ by IHC.  FISH negative.   Nipple discharge: Denies Family history: Mother diagnosed with breast cancer at age of 58, and colon cancer at age of 73.  Mother has BRCA2 mutation.   Maternal grandmother breast cancer, maternal great aunt breast cancer.  Patient recalls that she was tested long time ago and was not aware of any results.  OCP use: In her 20s-30s Estrogen and progesterone therapy: denies History of radiation to chest: denies.  Previous breast surgery: Denies  BRCA 2 positive.  # 01/14/2019 patient underwent bilateral mastectomy with sentinel lymph node biopsy of left and the right axillary. Right breast showed invasive mammary carcinoma, DCIS positive,  apocrine metaplastic, stromal fibrosis, duct ectasia, simple cyst formation, usual epithelial hyperplasia.  Sentinel lymph node on the right side was negative. pT2 pN0 Left prophylactic mastectomy and a sentinel lymph node negative for malignancy.  #OncotypeDX recurrence score 31, adjuvant chemotherapy benefit more than 15%.  INTERVAL HISTORY Cheryl Mooney is a 53 y.o. female who has above history reviewed by me today presents for follow up visit for management of stage I breast cancer, ER PR positive, HER-2 negative, hereditary BRCA 2 mutation.  Problems and complaints are listed below: Patient with here for evaluation prior adjuvant chemotherapy. Patient is status post cycle 1 DD AC.  She tolerates well.  Feels fatigued for a few days after chemo.   No new complaints today. Review of Systems  Constitutional: Negative for appetite change, chills, fatigue and fever.  HENT:   Negative for hearing loss and voice change.   Eyes: Negative for eye problems.  Respiratory: Negative for chest tightness and cough.   Cardiovascular: Negative for chest pain.  Gastrointestinal: Negative for abdominal distention, abdominal pain and blood in stool.  Endocrine: Negative for hot flashes.  Genitourinary: Negative for difficulty urinating and frequency.   Musculoskeletal: Negative for arthralgias.  Skin: Negative for itching and rash.  Neurological: Negative for extremity weakness.  Hematological: Negative for adenopathy.  Psychiatric/Behavioral: Negative for confusion. The patient is not nervous/anxious.     MEDICAL HISTORY:  Past Medical History:  Diagnosis Date   Benign neoplasm of sigmoid colon    Benign neoplasm of transverse colon    Breast cancer ([place])    Family history of BRCA2 gene positive    Family history of breast cancer  Family history of colon cancer    Family history of lung cancer    Family history of pancreatic cancer    GERD (gastroesophageal reflux disease)     History of kidney stones    Hypertension    Malignant neoplasm of lower-outer quadrant of right breast of female, estrogen receptor positive ([place]) 12/17/2018   Smoker     SURGICAL HISTORY: Past Surgical History:  Procedure Laterality Date   AXILLARY SENTINEL NODE BIOPSY Bilateral 01/14/2019   Procedure: AXILLARY SENTINEL NODE BIOPSY;  Surgeon: Herbert Pun, MD;  Location: [facility];  Service: General;  Laterality: Bilateral;   BREAST BIOPSY Right 12/08/2018   [place] bx of mass with calcs path pending   BREAST BIOPSY Right 12/08/2018   [place] bx of LN, path pending   BREAST RECONSTRUCTION WITH PLACEMENT OF TISSUE EXPANDER AND ALLODERM Bilateral 01/14/2019   Procedure: BILATERAL BREAST RECONSTRUCTION WITH PLACEMENT OF TISSUE EXPANDER AND FLEX HD;  Surgeon: Wallace Going, DO;  Location: [facility];  Service: Plastics;  Laterality: Bilateral;   COLONOSCOPY WITH PROPOFOL N/A 02/17/2015   Procedure: COLONOSCOPY WITH PROPOFOL;  Surgeon: Lucilla Lame, MD;  Location: [place];  Service: Endoscopy;  Laterality: N/A;   POLYPECTOMY  02/17/2015   Procedure: POLYPECTOMY;  Surgeon: Lucilla Lame, MD;  Location: [place];  Service: Endoscopy;;   PORTACATH PLACEMENT Left 02/18/2019   Procedure: INSERTION PORT-A-CATH;  Surgeon: Herbert Pun, MD;  Location: [facility];  Service: General;  Laterality: Left;   SIMPLE MASTECTOMY WITH AXILLARY SENTINEL NODE BIOPSY Left 01/14/2019   Procedure: LEFT SIMPLE MASTECTOMY;  Surgeon: Herbert Pun, MD;  Location: [facility];  Service: General;  Laterality: Left;   TOTAL MASTECTOMY Right 01/14/2019   Procedure: RIGHT TOTAL MASTECTOMY;  Surgeon: Herbert Pun, MD;  Location: [facility];  Service: General;  Laterality: Right;   WISDOM TOOTH EXTRACTION      SOCIAL HISTORY: Social History   Socioeconomic History   Marital status: Married    Spouse name: Not on file   Number of children: Not on file   Years of  education: Not on file   Highest education level: Not on file  Occupational History   Not on file  Social Needs   Financial resource strain: Not on file   Food insecurity    Worry: Not on file    Inability: Not on file   Transportation needs    Medical: Not on file    Non-medical: Not on file  Tobacco Use   Smoking status: Current Every Day Smoker    Packs/day: 0.25    Years: 20.00    Pack years: 5.00    Types: Cigarettes   Smokeless tobacco: Never Used   Tobacco comment: 3 cig. per day  Substance and Sexual Activity   Alcohol use: Yes    Alcohol/week: 2.0 standard drinks    Types: 2 Standard drinks or equivalent per week    Comment: occasional   Drug use: No   Sexual activity: Yes    Birth control/protection: Post-menopausal  Lifestyle   Physical activity    Days per week: Not on file    Minutes per session: Not on file   Stress: Not on file  Relationships   Social connections    Talks on phone: Not on file    Gets together: Not on file    Attends religious service: Not on file    Active member of club or organization: Not on file    Attends meetings of clubs  or organizations: Not on file    Relationship status: Not on file   Intimate partner violence    Fear of current or ex partner: Not on file    Emotionally abused: Not on file    Physically abused: Not on file    Forced sexual activity: Not on file  Other Topics Concern   Not on file  Social History Narrative   Not on file  She is a retired Pharmacist, hospital  FAMILY HISTORY: Family History  Problem Relation Age of Onset   Colon cancer Mother 69   Breast cancer Mother 24       "BRCA2+"   Hypertension Father    Stroke Father    Alzheimer's disease Father    Prostate cancer Father    Breast cancer Maternal Aunt        mat great aunt   Breast cancer Maternal Grandmother 58   Pancreatic cancer Maternal Grandfather 61    ALLERGIES:  has No Known Allergies.  MEDICATIONS:  Current  Outpatient Medications  Medication Sig Dispense Refill   acetaminophen (TYLENOL) 500 MG tablet Take 500 mg by mouth every 6 (six) hours as needed for mild pain.      bisacodyl (DULCOLAX) 5 MG EC tablet Take 5 mg by mouth daily as needed for mild constipation or moderate constipation.     cetirizine (ZYRTEC) 10 MG tablet Take 10 mg by mouth daily.     DULoxetine (CYMBALTA) 60 MG capsule Take 1 capsule (60 mg total) by mouth daily. 90 capsule 3   lisinopril-hydrochlorothiazide (ZESTORETIC) 20-25 MG tablet Take 1 tablet by mouth daily. 90 tablet 3   loratadine (CLARITIN) 10 MG tablet Take 10 mg by mouth daily.     omeprazole (PRILOSEC) 40 MG capsule Take 1 capsule (40 mg total) by mouth daily. 90 capsule 1   ondansetron (ZOFRAN) 8 MG tablet Take 1 tablet (8 mg total) by mouth every 8 (eight) hours as needed for nausea or vomiting. 20 tablet 0   diazepam (VALIUM) 2 MG tablet Take 1 tablet (2 mg total) by mouth every 12 (twelve) hours as needed for muscle spasms. (Patient not taking: Reported on 03/11/2019) 20 tablet 0   HYDROcodone-acetaminophen (NORCO/VICODIN) 5-325 MG tablet Take 1 tablet by mouth every 6 (six) hours as needed for moderate pain.     No current facility-administered medications for this visit.    Facility-Administered Medications Ordered in Other Visits  Medication Dose Route Frequency Provider Last Rate Last Dose   heparin lock flush 100 unit/mL  500 Units Intravenous Once Verlon Au, NP         PHYSICAL EXAMINATION: ECOG PERFORMANCE STATUS: 0 - Asymptomatic Vitals:   03/12/19 0837  BP: 114/79  Pulse: 78  Resp: 16  Temp: (!) 97 F (36.1 C)  SpO2: 97%   Filed Weights   03/12/19 0837  Weight: 154 lb 12.8 oz (70.2 kg)    Physical Exam Constitutional:      General: She is not in acute distress. HENT:     Head: Normocephalic and atraumatic.  Eyes:     General: No scleral icterus.    Pupils: Pupils are equal, round, and reactive to light.  Neck:      Musculoskeletal: Normal range of motion and neck supple.  Cardiovascular:     Rate and Rhythm: Normal rate and regular rhythm.     Heart sounds: Normal heart sounds.  Pulmonary:     Effort: Pulmonary effort is normal. No respiratory distress.  Breath sounds: No wheezing.  Abdominal:     General: Bowel sounds are normal. There is no distension.     Palpations: Abdomen is soft. There is no mass.     Tenderness: There is no abdominal tenderness.  Musculoskeletal: Normal range of motion.        General: No deformity.  Skin:    General: Skin is warm and dry.     Findings: No erythema or rash.  Neurological:     Mental Status: She is alert and oriented to person, place, and time.     Cranial Nerves: No cranial nerve deficit.     Coordination: Coordination normal.  Psychiatric:        Behavior: Behavior normal.        Thought Content: Thought content normal.    LABORATORY DATA:  I have reviewed the data as listed Lab Results  Component Value Date   WBC 16.4 (H) 03/12/2019   HGB 12.0 03/12/2019   HCT 37.1 03/12/2019   MCV 91.2 03/12/2019   PLT 224 03/12/2019   Recent Labs    03/03/19 0840 03/09/19 1018 03/12/19 0822  NA 132* 133* 135  K 3.3* 3.6 3.7  CL 103 101 102  CO2 22 24 26   GLUCOSE 97 118* 119*  BUN 15 9 10   CREATININE 0.53 0.56 0.56  CALCIUM 8.5* 9.1 9.3  GFRNONAA >60 >60 >60  GFRAA >60 >60 >60  PROT 6.6 7.2 7.2  ALBUMIN 3.8 4.3 4.3  AST 12* 13* 14*  ALT 21 12 14   ALKPHOS 86 114 120  BILITOT 0.6 0.3 0.3   Iron/TIBC/Ferritin/ %Sat No results found for: IRON, TIBC, FERRITIN, IRONPCTSAT   RADIOGRAPHIC STUDIES: I have personally reviewed the radiological images as listed and agreed with the findings in the report. X-ray Chest Pa Or Ap  Result Date: 02/18/2019 CLINICAL DATA:  53 year old female with Port-A-Cath placement. EXAM: CHEST  1 VIEW COMPARISON:  None. FINDINGS: Left-sided Port-A-Cath with tip at the cavoatrial junction. No pneumothorax. Mild  diffuse interstitial prominence. No focal consolidation, or pleural effusion. The cardiac silhouette is within normal limits. No acute osseous pathology. Bilateral breast implants or tissue expanders noted. IMPRESSION: Port-A-Cath with tip at the cavoatrial junction. No pneumothorax. Electronically Signed   By: Anner Crete M.D.   On: 02/18/2019 13:19   Nm Cardiac Muga Rest  Result Date: 02/11/2019 CLINICAL DATA:  Breast cancer. Evaluate cardiac function in relation to chemotherapy. EXAM: NUCLEAR MEDICINE CARDIAC BLOOD POOL IMAGING (MUGA) TECHNIQUE: Cardiac multi-gated acquisition was performed at rest following intravenous injection of Tc-47mlabeled red blood cells. RADIOPHARMACEUTICALS:  21.8 mCi Tc-954mertechnetate in-vitro labeled red blood cells IV COMPARISON:  None. FINDINGS: No  focal wall motion abnormality of the left ventricle. Calculated left ventricular ejection fraction equals 59.3% IMPRESSION: Left ventricular ejection fraction equals59 %. Electronically Signed   By: StSuzy Bouchard.D.   On: 02/11/2019 16:10   Nm Sentinel Node Injection  Result Date: 01/14/2019 CLINICAL DATA:  Right breast cancer. EXAM: NUCLEAR MEDICINE BREAST LYMPHOSCINTIGRAPHY TECHNIQUE: Intradermal injection of radiopharmaceutical was performed at the 12 o'clock, 3 o'clock, 6 o'clock, and 9 o'clock positions around the right nipple. The patient was then sent to the operating room where the sentinel node(s) were identified and removed by the surgeon. RADIOPHARMACEUTICALS:  Total of 1 mCi Millipore-filtered Technetium-9948mlfur colloid, injected in four aliquots of 0.25 mCi each. IMPRESSION: Uncomplicated intradermal injection of a total of 1 mCi Technetium-72m51mfur colloid for purposes of sentinel node identification. Electronically Signed  By: Inez Catalina M.D.   On: 01/14/2019 09:11   Nm Sentinel Node Injection  Result Date: 01/14/2019 CLINICAL DATA:  Left breast cancer. EXAM: NUCLEAR MEDICINE BREAST  LYMPHOSCINTIGRAPHY TECHNIQUE: Intradermal injection of radiopharmaceutical was performed at the 12 o'clock, 3 o'clock, 6 o'clock, and 9 o'clock positions around the left nipple. The patient was then sent to the operating room where the sentinel node(s) were identified and removed by the surgeon. RADIOPHARMACEUTICALS:  Total of 1 mCi Millipore-filtered Technetium-40msulfur colloid, injected in four aliquots of 0.25 mCi each. IMPRESSION: Uncomplicated intradermal injection of a total of 1 mCi Technetium-976mulfur colloid for purposes of sentinel node identification. Electronically Signed   By: MaInez Catalina.D.   On: 01/14/2019 09:11   Dg C-arm 1-60 Min-no Report  Result Date: 02/18/2019 Fluoroscopy was utilized by the requesting physician.  No radiographic interpretation.      ASSESSMENT & PLAN:  1. Malignant neoplasm of lower-outer quadrant of right breast of female, [place]  2. Hyponatremia   3. BRCA2 gene mutation positive in female   4. Encounter for antineoplastic chemotherapy   5. Goals of care, counseling/discussion    Stage IB pT2 pN0 M0 ER + PR+ HER2 negative right breast cancer, grade 3, +LVI,   -BRCA2 positive Oncotype 31. Status post 1 cycle of dose dense AC.  She tolerates well. I discussed with patient that I have discussed with breast cancer specialist about her case.. Although patient has BRCA 2 positivity, her axillary node is negative, anthracycline free regimen can be considered. Advantage of fever cardiovascular long-term side effects after anthracycline free regimen was discussed. Consider docetaxel + carboplatin every 3 weeks x 3-4 cycles. Carboplatin was used to to substitute cyclophosphamide given the benefit of doubt of potential more sensitivity in BRCA mutation associated breast cancers. Patient appreciates explanation and agrees with the change. I explained to the patient the risks and benefits of chemotherapy docetaxel and carboplatin  including all but not limited to infusion reaction, hair loss, hearing loss, mouth sore, nausea, vomiting, low blood counts, bleeding, heart failure, kidney failure and risk of life threatening infection and even death, secondary malignancy etc.   Patient voices understanding and willing to proceed chemotherapy.   Hold chemotherapy treatment today.  We will submit treatment plan for approval. Patient follow-up in 1 week for first cycle of carboplatin and docetaxel treatment.  #Labs reviewed and discussed with patient.  Hyponatremia has improved.  Sodium level 135.  Orders Placed This Encounter  Procedures   CBC with Differential    Standing Status:   Standing    Number of Occurrences:   20    Standing Expiration Date:   03/14/2020   Comprehensive metabolic panel    Standing Status:   Standing    Number of Occurrences:   20    Standing Expiration Date:   03/14/2020    All questions were answered. The patient knows to call the clinic with any problems questions or concerns.   ZhEarlie ServerMD, PhD Hematology Oncology CoInd[facility] Hospitalt AlSurgical Specialty Centerager- [phone]/09/2018

## 2019-03-14 NOTE — Progress Notes (Signed)
DISCONTINUE ON PATHWAY REGIMEN - Breast   Dose-Dense AC q14 days:   A cycle is every 14 days:     Doxorubicin      Cyclophosphamide      Pegfilgrastim-[id number]   **Always confirm dose/schedule in your pharmacy ordering system**  Paclitaxel 80 mg/m2 Weekly:   Administer weekly:     Paclitaxel   **Always confirm dose/schedule in your pharmacy ordering system**  REASON: Other Reason PRIOR TREATMENT: [id number]: Dose-Dense AC-T (Paclitaxel Weekly) - [Doxorubicin + Cyclophosphamide q14 Days x 4 Cycles, Followed by Paclitaxel 80 mg/m2 Weekly x 12 Weeks] TREATMENT RESPONSE: N/A - Adjuvant Therapy  START ON PATHWAY REGIMEN - Breast     A cycle is every 21 days:     Docetaxel      Cyclophosphamide   **Always confirm dose/schedule in your pharmacy ordering system**  Patient Characteristics: Postoperative without Neoadjuvant Therapy (Pathologic Staging), Invasive Disease, Adjuvant Therapy, HER2 Negative/Unknown/Equivocal, ER Positive, Node Negative, pT1a-c, pN0/N69m or pT2 or Higher, pN0, Oncotype High Risk (? 26) Therapeutic Status: Postoperative without Neoadjuvant Therapy (Pathologic Staging) AJCC Grade: G3 AJCC N Category: pN0 AJCC M Category: cM0 ER Status: Positive (+) AJCC 8 Stage Grouping: IB HER2 Status: Negative (-) Oncotype Dx Recurrence Score: 31 AJCC T Category: pT2 PR Status: Positive (+) Has this patient completed genomic testing<= Yes - Oncotype DX(R) Intent of Therapy: Curative Intent, Discussed with Patient

## 2019-03-15 ENCOUNTER — Telehealth: Payer: Self-pay

## 2019-03-15 ENCOUNTER — Inpatient Hospital Stay: Payer: BC Managed Care – PPO

## 2019-03-15 NOTE — Telephone Encounter (Signed)
-----   Message from Earlie Server, MD sent at 03/14/2019  4:58 PM EST ----- Please advise patient toTake dexamethasone 8 mg twice daily on the day before coming to her new chemotherapy.  Thank you

## 2019-03-15 NOTE — Telephone Encounter (Signed)
Patient informed. 

## 2019-03-16 ENCOUNTER — Encounter: Payer: Self-pay | Admitting: Oncology

## 2019-03-17 ENCOUNTER — Other Ambulatory Visit: Payer: Self-pay

## 2019-03-17 NOTE — Progress Notes (Signed)
Patient pre screened for office appointment, no questions or concerns today. Patient reminded of upcoming appointment time and date. 

## 2019-03-18 ENCOUNTER — Inpatient Hospital Stay: Payer: BC Managed Care – PPO | Admitting: *Deleted

## 2019-03-18 ENCOUNTER — Inpatient Hospital Stay (HOSPITAL_BASED_OUTPATIENT_CLINIC_OR_DEPARTMENT_OTHER): Payer: BC Managed Care – PPO | Admitting: Oncology

## 2019-03-18 ENCOUNTER — Other Ambulatory Visit: Payer: Self-pay

## 2019-03-18 ENCOUNTER — Encounter: Payer: Self-pay | Admitting: Oncology

## 2019-03-18 ENCOUNTER — Inpatient Hospital Stay: Payer: BC Managed Care – PPO

## 2019-03-18 VITALS — BP 130/80 | HR 76 | Temp 97.1°F | Resp 18 | Wt 153.9 lb

## 2019-03-18 VITALS — BP 115/76 | HR 70 | Resp 18

## 2019-03-18 DIAGNOSIS — Z95828 Presence of other vascular implants and grafts: Secondary | ICD-10-CM

## 2019-03-18 DIAGNOSIS — C50919 Malignant neoplasm of unspecified site of unspecified female breast: Secondary | ICD-10-CM

## 2019-03-18 DIAGNOSIS — E871 Hypo-osmolality and hyponatremia: Secondary | ICD-10-CM | POA: Diagnosis not present

## 2019-03-18 DIAGNOSIS — C50511 Malignant neoplasm of lower-outer quadrant of right female breast: Secondary | ICD-10-CM | POA: Diagnosis not present

## 2019-03-18 DIAGNOSIS — Z1501 Genetic susceptibility to malignant neoplasm of breast: Secondary | ICD-10-CM

## 2019-03-18 DIAGNOSIS — C50911 Malignant neoplasm of unspecified site of right female breast: Secondary | ICD-10-CM | POA: Diagnosis not present

## 2019-03-18 DIAGNOSIS — Z170 Estrogen receptor positive status [ER+]: Secondary | ICD-10-CM

## 2019-03-18 DIAGNOSIS — Z1502 Genetic susceptibility to malignant neoplasm of ovary: Secondary | ICD-10-CM

## 2019-03-18 DIAGNOSIS — Z1509 Genetic susceptibility to other malignant neoplasm: Secondary | ICD-10-CM

## 2019-03-18 DIAGNOSIS — Z5111 Encounter for antineoplastic chemotherapy: Secondary | ICD-10-CM

## 2019-03-18 LAB — COMPREHENSIVE METABOLIC PANEL
ALT: 13 U/L (ref 0–44)
AST: 14 U/L — ABNORMAL LOW (ref 15–41)
Albumin: 4.2 g/dL (ref 3.5–5.0)
Alkaline Phosphatase: 90 U/L (ref 38–126)
Anion gap: 8 (ref 5–15)
BUN: 15 mg/dL (ref 6–20)
CO2: 23 mmol/L (ref 22–32)
Calcium: 9.5 mg/dL (ref 8.9–10.3)
Chloride: 102 mmol/L (ref 98–111)
Creatinine, Ser: 0.55 mg/dL (ref 0.44–1.00)
GFR calc Af Amer: >60 mL/min (ref >60)
GFR calc non Af Amer: >60 mL/min (ref >60)
Glucose, Bld: 146 mg/dL — ABNORMAL HIGH (ref 70–99)
Potassium: 3.4 mmol/L — ABNORMAL LOW (ref 3.5–5.1)
Sodium: 133 mmol/L — ABNORMAL LOW (ref 135–145)
Total Bilirubin: 0.4 mg/dL (ref 0.3–1.2)
Total Protein: 7.5 g/dL (ref 6.5–8.1)

## 2019-03-18 LAB — CBC WITH DIFFERENTIAL/PLATELET
Abs Immature Granulocytes: 0.14 10*3/uL — ABNORMAL HIGH (ref 0.00–0.07)
Basophils Absolute: 0 10*3/uL (ref 0.0–0.1)
Basophils Relative: 0 %
Eosinophils Absolute: 0 10*3/uL (ref 0.0–0.5)
Eosinophils Relative: 0 %
HCT: 35.1 % — ABNORMAL LOW (ref 36.0–46.0)
Hemoglobin: 11.5 g/dL — ABNORMAL LOW (ref 12.0–15.0)
Immature Granulocytes: 1 %
Lymphocytes Relative: 8 %
Lymphs Abs: 1.4 10*3/uL (ref 0.7–4.0)
MCH: 29.6 pg (ref 26.0–34.0)
MCHC: 32.8 g/dL (ref 30.0–36.0)
MCV: 90.5 fL (ref 80.0–100.0)
Monocytes Absolute: 0.8 10*3/uL (ref 0.1–1.0)
Monocytes Relative: 5 %
Neutro Abs: 15.1 10*3/uL — ABNORMAL HIGH (ref 1.7–7.7)
Neutrophils Relative %: 86 %
Platelets: 411 10*3/uL — ABNORMAL HIGH (ref 150–400)
RBC: 3.88 MIL/uL (ref 3.87–5.11)
RDW: 13.2 % (ref 11.5–15.5)
WBC: 17.6 10*3/uL — ABNORMAL HIGH (ref 4.0–10.5)
nRBC: 0 % (ref 0.0–0.2)

## 2019-03-18 MED ORDER — SODIUM CHLORIDE 0.9 % IV SOLN
Freq: Once | INTRAVENOUS | Status: AC
  Administered 2019-03-18: 10:00:00 via INTRAVENOUS
  Filled 2019-03-18: qty 250

## 2019-03-18 MED ORDER — PALONOSETRON HCL INJECTION 0.25 MG/5ML
0.2500 mg | Freq: Once | INTRAVENOUS | Status: AC
  Administered 2019-03-18: 10:00:00 0.25 mg via INTRAVENOUS
  Filled 2019-03-18: qty 5

## 2019-03-18 MED ORDER — SODIUM CHLORIDE 0.9% FLUSH
10.0000 mL | Freq: Once | INTRAVENOUS | Status: AC
  Administered 2019-03-18: 10 mL via INTRAVENOUS
  Filled 2019-03-18: qty 10

## 2019-03-18 MED ORDER — HEPARIN SOD (PORK) LOCK FLUSH 100 UNIT/ML IV SOLN
500.0000 [IU] | Freq: Once | INTRAVENOUS | Status: AC | PRN
  Administered 2019-03-18: 14:00:00 500 [IU]
  Filled 2019-03-18: qty 5

## 2019-03-18 MED ORDER — SODIUM CHLORIDE 0.9 % IV SOLN: 150.0000 mg | Freq: Once | INTRAVENOUS | Status: DC

## 2019-03-18 MED ORDER — SODIUM CHLORIDE 0.9 % IV SOLN
Freq: Once | INTRAVENOUS | Status: AC
  Administered 2019-03-18: 10:00:00 via INTRAVENOUS
  Filled 2019-03-18: qty 5

## 2019-03-18 MED ORDER — SODIUM CHLORIDE 0.9 % IV SOLN
75.0000 mg/m2 | Freq: Once | INTRAVENOUS | Status: AC
  Administered 2019-03-18: 130 mg via INTRAVENOUS
  Filled 2019-03-18: qty 13

## 2019-03-18 MED ORDER — SODIUM CHLORIDE 0.9 % IV SOLN
565.5000 mg | Freq: Once | INTRAVENOUS | Status: AC
  Administered 2019-03-18: 570 mg via INTRAVENOUS
  Filled 2019-03-18: qty 57

## 2019-03-18 MED ORDER — SODIUM CHLORIDE 0.9 % IV SOLN: 10.0000 mg | Freq: Once | INTRAVENOUS | Status: DC

## 2019-03-18 NOTE — Progress Notes (Signed)
Hematology/Oncology follow up  note [facility] Telephone:(336) [phone] Fax:(336) [phone]   Patient Care Team: Glean Hess, MD as PCP - General (Internal Medicine) Jannet Mantis, MD (Dermatology)  CHIEF COMPLAINTS/REASON FOR VISIT:  Follow-up for breast cancer.  HISTORY OF PRESENTING ILLNESS:  Cheryl Mooney is a  53 y.o.  female with PMH listed below who was referred to me for evaluation of breast cancer Patient reports feeling mass of her right breast week before her annual mammogram. 12/03/2018 patient had bilateral diagnostic mammogram and ultrasound  which showed suspicious 2.1 x 1.3 x 1.6 irregular mass at 7:00 in the right breast, 2 cm from the nipple. 2 borderline lymph nodes are seen in the right axilla.  1 of the nodes demonstrate a cortex of 4.4 mm.  Both lymph nodes demonstrated retention of fatty hila. Patient underwent ultrasound-guided biopsy of right breast mass as well as ultrasound-guided biopsy of 1 of the 2 mildly abnormal right axillary lymph nodes. 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 Right axilla lymph node negative for malignancy. ER> 90% positive, PR11-50% positive, HER-2 equivocal, 2+ by IHC.  FISH negative.   Nipple discharge: Denies Family history: Mother diagnosed with breast cancer at age of 59, and colon cancer at age of 42.  Mother has BRCA2 mutation.   Maternal grandmother breast cancer, maternal great aunt breast cancer.  Patient recalls that she was tested long time ago and was not aware of any results.  OCP use: In her 20s-30s Estrogen and progesterone therapy: denies History of radiation to chest: denies.  Previous breast surgery: Denies  BRCA 2 positive.  # 01/14/2019 patient underwent bilateral mastectomy with sentinel lymph node biopsy of left and the right axillary. Right breast showed invasive mammary carcinoma, DCIS  positive, apocrine metaplastic, stromal fibrosis, duct ectasia, simple cyst formation, usual epithelial hyperplasia.  Sentinel lymph node on the right side was negative. pT2 pN0 Left prophylactic mastectomy and a sentinel lymph node negative for malignancy.  #OncotypeDX recurrence score 31, adjuvant chemotherapy benefit more than 15%. # She follows up with plastic surgeon Dr. Marla Roe, she has expander, expanding process is being during chemotherapy. Mediport was placed by Dr. Peyton Najjar on 02/18/2019.  INTERVAL HISTORY Cheryl Mooney is a 53 y.o. female who has above history reviewed by me today presents for follow up visit for management of stage I breast cancer, ER PR positive, HER-2 negative, hereditary BRCA 2 mutation.  Problems and complaints are listed below: She has no new complaints. Feels well.  Denies fever, chills, nausea, vomiting, diarrhea, chest pain, shortness of breath, abdominal pain, urinary symptoms, lower extremity swelling.  No concerns at her mastectomy sites.   Review of Systems  Constitutional: Negative for appetite change, chills, fatigue and fever.  HENT:   Negative for hearing loss and voice change.   Eyes: Negative for eye problems.  Respiratory: Negative for chest tightness and cough.   Cardiovascular: Negative for chest pain.  Gastrointestinal: Negative for abdominal distention, abdominal pain and blood in stool.  Endocrine: Negative for hot flashes.  Genitourinary: Negative for difficulty urinating and frequency.   Musculoskeletal: Negative for arthralgias.  Skin: Negative for itching and rash.  Neurological: Negative for extremity weakness.  Hematological: Negative for adenopathy.  Psychiatric/Behavioral: Negative for confusion. The patient is not nervous/anxious.     MEDICAL HISTORY:  Past Medical History:  Diagnosis Date  . Benign neoplasm of sigmoid colon   . Benign neoplasm of  transverse colon   . Breast cancer ([employer])   . Family history of  BRCA2 gene positive   . Family history of breast cancer   . Family history of colon cancer   . Family history of lung cancer   . Family history of pancreatic cancer   . GERD (gastroesophageal reflux disease)   . History of kidney stones   . Hypertension   . Malignant neoplasm of lower-outer quadrant of right breast of female, estrogen receptor positive ([place]) 12/17/2018  . Smoker     SURGICAL HISTORY: Past Surgical History:  Procedure Laterality Date  . AXILLARY SENTINEL NODE BIOPSY Bilateral 01/14/2019   Procedure: AXILLARY SENTINEL NODE BIOPSY;  Surgeon: Herbert Pun, MD;  Location: [facility];  Service: General;  Laterality: Bilateral;  . BREAST BIOPSY Right 12/08/2018   [place] bx of mass with calcs path pending  . BREAST BIOPSY Right 12/08/2018   [place] bx of LN, path pending  . BREAST RECONSTRUCTION WITH PLACEMENT OF TISSUE EXPANDER AND ALLODERM Bilateral 01/14/2019   Procedure: BILATERAL BREAST RECONSTRUCTION WITH PLACEMENT OF TISSUE EXPANDER AND FLEX HD;  Surgeon: Wallace Going, DO;  Location: [facility];  Service: Plastics;  Laterality: Bilateral;  . COLONOSCOPY WITH PROPOFOL N/A 02/17/2015   Procedure: COLONOSCOPY WITH PROPOFOL;  Surgeon: Lucilla Lame, MD;  Location: [facility];  Service: Endoscopy;  Laterality: N/A;  . POLYPECTOMY  02/17/2015   Procedure: POLYPECTOMY;  Surgeon: Lucilla Lame, MD;  Location: [place];  Service: Endoscopy;;  . PORTACATH PLACEMENT Left 02/18/2019   Procedure: INSERTION PORT-A-CATH;  Surgeon: Herbert Pun, MD;  Location: [facility];  Service: General;  Laterality: Left;  . SIMPLE MASTECTOMY WITH AXILLARY SENTINEL NODE BIOPSY Left 01/14/2019   Procedure: LEFT SIMPLE MASTECTOMY;  Surgeon: Herbert Pun, MD;  Location: [facility];  Service: General;  Laterality: Left;  . TOTAL MASTECTOMY Right 01/14/2019   Procedure: RIGHT TOTAL MASTECTOMY;  Surgeon: Herbert Pun, MD;  Location: [facility];  Service: General;   Laterality: Right;  . WISDOM TOOTH EXTRACTION      SOCIAL HISTORY: Social History   Socioeconomic History  . Marital status: Married    Spouse name: Not on file  . Number of children: Not on file  . Years of education: Not on file  . Highest education level: Not on file  Occupational History  . Not on file  Tobacco Use  . Smoking status: Current Every Day Smoker    Packs/day: 0.25    Years: 20.00    Pack years: 5.00    Types: Cigarettes  . Smokeless tobacco: Never Used  . Tobacco comment: 3 cig. per day  Substance and Sexual Activity  . Alcohol use: Yes    Alcohol/week: 2.0 standard drinks    Types: 2 Standard drinks or equivalent per week    Comment: occasional  . Drug use: No  . Sexual activity: Yes    Birth control/protection: Post-menopausal  Other Topics Concern  . Not on file  Social History Narrative  . Not on file   Social Determinants of Health   Financial Resource Strain:   . Difficulty of Paying Living Expenses: Not on file  Food Insecurity:   . Worried About Charity fundraiser in the Last Year: Not on file  . Ran Out of Food in the Last Year: Not on file  Transportation Needs:   . Lack of Transportation (Medical): Not on file  . Lack of Transportation (Non-Medical): Not on file  Physical Activity:   .  Days of Exercise per Week: Not on file  . Minutes of Exercise per Session: Not on file  Stress:   . Feeling of Stress : Not on file  Social Connections:   . Frequency of Communication with Friends and Family: Not on file  . Frequency of Social Gatherings with Friends and Family: Not on file  . Attends Religious Services: Not on file  . Active Member of Clubs or Organizations: Not on file  . Attends Archivist Meetings: Not on file  . Marital Status: Not on file  Intimate Partner Violence:   . Fear of Current or Ex-Partner: Not on file  . Emotionally Abused: Not on file  . Physically Abused: Not on file  . Sexually Abused: Not on file   She is a retired Pharmacist, hospital  FAMILY HISTORY: Family History  Problem Relation Age of Onset  . Colon cancer Mother 47  . Breast cancer Mother 13       "BRCA2+"  . Hypertension Father   . Stroke Father   . Alzheimer's disease Father   . Prostate cancer Father   . Breast cancer Maternal Aunt        mat great aunt  . Breast cancer Maternal Grandmother 73  . Pancreatic cancer Maternal Grandfather 60    ALLERGIES:  has No Known Allergies.  MEDICATIONS:  Current Outpatient Medications  Medication Sig Dispense Refill  . acetaminophen (TYLENOL) 500 MG tablet Take 500 mg by mouth every 6 (six) hours as needed for mild pain.     . bisacodyl (DULCOLAX) 5 MG EC tablet Take 5 mg by mouth daily as needed for mild constipation or moderate constipation.    . cetirizine (ZYRTEC) 10 MG tablet Take 10 mg by mouth daily.    Marland Kitchen dexamethasone (DECADRON) 4 MG tablet Take 2 tablets (8 mg total) by mouth 2 (two) times daily. Take decadron  Twice daily the day before taxotere then daily x 3 days after carboplatin  chemo 30 tablet 1  . DULoxetine (CYMBALTA) 60 MG capsule Take 1 capsule (60 mg total) by mouth daily. 90 capsule 3  . lisinopril-hydrochlorothiazide (ZESTORETIC) 20-25 MG tablet Take 1 tablet by mouth daily. 90 tablet 3  . loratadine (CLARITIN) 10 MG tablet Take 10 mg by mouth daily.    Marland Kitchen omeprazole (PRILOSEC) 40 MG capsule Take 1 capsule (40 mg total) by mouth daily. 90 capsule 1  . ondansetron (ZOFRAN) 8 MG tablet Take 1 tablet (8 mg total) by mouth 2 (two) times daily as needed for refractory nausea / vomiting. Start on day 3 after chemo. 30 tablet 1  . prochlorperazine (COMPAZINE) 10 MG tablet Take 1 tablet (10 mg total) by mouth every 6 (six) hours as needed (Nausea or vomiting). 30 tablet 1  . diazepam (VALIUM) 2 MG tablet Take 1 tablet (2 mg total) by mouth every 12 (twelve) hours as needed for muscle spasms. (Patient not taking: Reported on 03/11/2019) 20 tablet 0  . HYDROcodone-acetaminophen  (NORCO/VICODIN) 5-325 MG tablet Take 1 tablet by mouth every 6 (six) hours as needed for moderate pain.     No current facility-administered medications for this visit.     PHYSICAL EXAMINATION: ECOG PERFORMANCE STATUS: 0 - Asymptomatic Vitals:   03/18/19 0830  BP: 130/80  Pulse: 76  Resp: 18  Temp: (!) 97.1 F (36.2 C)   Filed Weights   03/18/19 0830  Weight: 153 lb 14.4 oz (69.8 kg)    Physical Exam Constitutional:  General: She is not in acute distress. HENT:     Head: Normocephalic and atraumatic.  Eyes:     General: No scleral icterus.    Pupils: Pupils are equal, round, and reactive to light.  Cardiovascular:     Rate and Rhythm: Normal rate and regular rhythm.     Heart sounds: Normal heart sounds.  Pulmonary:     Effort: Pulmonary effort is normal. No respiratory distress.     Breath sounds: No wheezing.  Abdominal:     General: Bowel sounds are normal. There is no distension.     Palpations: Abdomen is soft. There is no mass.     Tenderness: There is no abdominal tenderness.  Musculoskeletal:        General: No deformity. Normal range of motion.     Cervical back: Normal range of motion and neck supple.  Skin:    General: Skin is warm and dry.     Findings: No erythema or rash.  Neurological:     Mental Status: She is alert and oriented to person, place, and time.     Cranial Nerves: No cranial nerve deficit.     Coordination: Coordination normal.  Psychiatric:        Behavior: Behavior normal.        Thought Content: Thought content normal.    LABORATORY DATA:  I have reviewed the data as listed Lab Results  Component Value Date   WBC 17.6 (H) 03/18/2019   HGB 11.5 (L) 03/18/2019   HCT 35.1 (L) 03/18/2019   MCV 90.5 03/18/2019   PLT 411 (H) 03/18/2019   Recent Labs    03/09/19 1018 03/12/19 0822 03/18/19 0808  NA 133* 135 133*  K 3.6 3.7 3.4*  CL 101 102 102  CO2 24 26 23   GLUCOSE 118* 119* 146*  BUN 9 10 15   CREATININE 0.56  0.56 0.55  CALCIUM 9.1 9.3 9.5  GFRNONAA >60 >60 >60  GFRAA >60 >60 >60  PROT 7.2 7.2 7.5  ALBUMIN 4.3 4.3 4.2  AST 13* 14* 14*  ALT 12 14 13   ALKPHOS 114 120 90  BILITOT 0.3 0.3 0.4   Iron/TIBC/Ferritin/ %Sat No results found for: IRON, TIBC, FERRITIN, IRONPCTSAT   RADIOGRAPHIC STUDIES: I have personally reviewed the radiological images as listed and agreed with the findings in the report. X-ray chest PA or AP  Result Date: 02/18/2019 CLINICAL DATA:  53 year old female with Port-A-Cath placement. EXAM: CHEST  1 VIEW COMPARISON:  None. FINDINGS: Left-sided Port-A-Cath with tip at the cavoatrial junction. No pneumothorax. Mild diffuse interstitial prominence. No focal consolidation, or pleural effusion. The cardiac silhouette is within normal limits. No acute osseous pathology. Bilateral breast implants or tissue expanders noted. IMPRESSION: Port-A-Cath with tip at the cavoatrial junction. No pneumothorax. Electronically Signed   By: Anner Crete M.D.   On: 02/18/2019 13:19   NM Cardiac Muga Rest  Result Date: 02/11/2019 CLINICAL DATA:  Breast cancer. Evaluate cardiac function in relation to chemotherapy. EXAM: NUCLEAR MEDICINE CARDIAC BLOOD POOL IMAGING (MUGA) TECHNIQUE: Cardiac multi-gated acquisition was performed at rest following intravenous injection of Tc-91mlabeled red blood cells. RADIOPHARMACEUTICALS:  21.8 mCi Tc-973mertechnetate in-vitro labeled red blood cells IV COMPARISON:  None. FINDINGS: No  focal wall motion abnormality of the left ventricle. Calculated left ventricular ejection fraction equals 59.3% IMPRESSION: Left ventricular ejection fraction equals59 %. Electronically Signed   By: StSuzy Bouchard.D.   On: 02/11/2019 16:10   NM SENTINEL NODE INJECTION  Result  Date: 01/14/2019 CLINICAL DATA:  Right breast cancer. EXAM: NUCLEAR MEDICINE BREAST LYMPHOSCINTIGRAPHY TECHNIQUE: Intradermal injection of radiopharmaceutical was performed at the 12 o'clock, 3 o'clock, 6  o'clock, and 9 o'clock positions around the right nipple. The patient was then sent to the operating room where the sentinel node(s) were identified and removed by the surgeon. RADIOPHARMACEUTICALS:  Total of 1 mCi Millipore-filtered Technetium-45msulfur colloid, injected in four aliquots of 0.25 mCi each. IMPRESSION: Uncomplicated intradermal injection of a total of 1 mCi Technetium-990mulfur colloid for purposes of sentinel node identification. Electronically Signed   By: MaInez Catalina.D.   On: 01/14/2019 09:11   NM SENTINEL NODE INJECTION  Result Date: 01/14/2019 CLINICAL DATA:  Left breast cancer. EXAM: NUCLEAR MEDICINE BREAST LYMPHOSCINTIGRAPHY TECHNIQUE: Intradermal injection of radiopharmaceutical was performed at the 12 o'clock, 3 o'clock, 6 o'clock, and 9 o'clock positions around the left nipple. The patient was then sent to the operating room where the sentinel node(s) were identified and removed by the surgeon. RADIOPHARMACEUTICALS:  Total of 1 mCi Millipore-filtered Technetium-9954mlfur colloid, injected in four aliquots of 0.25 mCi each. IMPRESSION: Uncomplicated intradermal injection of a total of 1 mCi Technetium-58m61mfur colloid for purposes of sentinel node identification. Electronically Signed   By: MarkInez Catalina.   On: 01/14/2019 09:11   DG C-Arm 1-60 Min-No Report  Result Date: 02/18/2019 Fluoroscopy was utilized by the requesting physician.  No radiographic interpretation.      ASSESSMENT & PLAN:  1. Malignant neoplasm of right breast in female, estrogen receptor positive, unspecified site of breast (HCC)[place]2. BRCA2 gene mutation positive in female   3. Hyponatremia   4. Encounter for antineoplastic chemotherapy    Stage IB pT2 pN0 M0 ER + PR+ HER2 negative right breast cancer, grade 3, +LVI,   -BRCA2 positive Oncotype 31. # We have discussed at the last visit about rationale of switching to docetaxel and carboplatin.   She has some questions regarding this  regimen and I answered all questions to her satisfaction. Potential side effects and complications were discussed again today with patient. Patient agrees with proceeding with chemotherapy today. Labs are reviewed and discussed with patient.  Counts are acceptable to proceed with cycle 1 docetaxel and carboplatin with day 2 for growth factor support.  Hyponatremia, sodium has improved to 133 today.  Continue to monitor.  #BRCA gene mutation positive. She has establish care with gynecology for further evaluation. Screening for pancreatic cancer is controversial.  We will further discuss in the future once he finished chemotherapy..     All questions were answered. The patient knows to call the clinic with any problems questions or concerns.     ZhouEarlie Server, PhD Hematology Oncology [facility] [phone]

## 2019-03-18 NOTE — Progress Notes (Signed)
Patient here for follow up. BP is 130/80 and pt is asking if she should take bp med prior to chemo since "the medicine will cause it to drop."

## 2019-03-19 ENCOUNTER — Inpatient Hospital Stay: Payer: BC Managed Care – PPO

## 2019-03-19 ENCOUNTER — Other Ambulatory Visit: Payer: Self-pay

## 2019-03-19 ENCOUNTER — Encounter: Payer: Self-pay | Admitting: Oncology

## 2019-03-19 DIAGNOSIS — C50919 Malignant neoplasm of unspecified site of unspecified female breast: Secondary | ICD-10-CM

## 2019-03-19 DIAGNOSIS — C50511 Malignant neoplasm of lower-outer quadrant of right female breast: Secondary | ICD-10-CM

## 2019-03-19 DIAGNOSIS — Z170 Estrogen receptor positive status [ER+]: Secondary | ICD-10-CM

## 2019-03-19 MED ORDER — PEGFILGRASTIM-JMDB 6 MG/0.6ML ~~LOC~~ SOSY
6.0000 mg | PREFILLED_SYRINGE | Freq: Once | SUBCUTANEOUS | Status: AC
  Administered 2019-03-19: 6 mg via SUBCUTANEOUS
  Filled 2019-03-19: qty 0.6

## 2019-03-19 MED ORDER — POTASSIUM CHLORIDE ER 10 MEQ PO TBCR: 10.0000 meq | EXTENDED_RELEASE_TABLET | Freq: Every day | ORAL | 0 refills | Status: DC

## 2019-03-23 ENCOUNTER — Encounter: Payer: Self-pay | Admitting: Oncology

## 2019-03-25 ENCOUNTER — Encounter: Payer: Self-pay | Admitting: Nurse Practitioner

## 2019-03-26 ENCOUNTER — Inpatient Hospital Stay: Payer: BC Managed Care – PPO | Admitting: *Deleted

## 2019-03-26 ENCOUNTER — Inpatient Hospital Stay: Payer: BC Managed Care – PPO

## 2019-03-26 ENCOUNTER — Inpatient Hospital Stay (HOSPITAL_BASED_OUTPATIENT_CLINIC_OR_DEPARTMENT_OTHER): Payer: BC Managed Care – PPO | Admitting: Oncology

## 2019-03-26 ENCOUNTER — Encounter: Payer: Self-pay | Admitting: Oncology

## 2019-03-26 ENCOUNTER — Other Ambulatory Visit: Payer: Self-pay

## 2019-03-26 ENCOUNTER — Other Ambulatory Visit: Payer: Self-pay | Admitting: *Deleted

## 2019-03-26 VITALS — BP 114/75 | HR 112 | Temp 98.7°F | Resp 16 | Wt 155.5 lb

## 2019-03-26 DIAGNOSIS — Z1501 Genetic susceptibility to malignant neoplasm of breast: Secondary | ICD-10-CM | POA: Diagnosis not present

## 2019-03-26 DIAGNOSIS — Z1509 Genetic susceptibility to other malignant neoplasm: Secondary | ICD-10-CM

## 2019-03-26 DIAGNOSIS — C50919 Malignant neoplasm of unspecified site of unspecified female breast: Secondary | ICD-10-CM

## 2019-03-26 DIAGNOSIS — R197 Diarrhea, unspecified: Secondary | ICD-10-CM

## 2019-03-26 DIAGNOSIS — E871 Hypo-osmolality and hyponatremia: Secondary | ICD-10-CM | POA: Diagnosis not present

## 2019-03-26 DIAGNOSIS — Z170 Estrogen receptor positive status [ER+]: Secondary | ICD-10-CM

## 2019-03-26 DIAGNOSIS — C50511 Malignant neoplasm of lower-outer quadrant of right female breast: Secondary | ICD-10-CM

## 2019-03-26 DIAGNOSIS — Z7189 Other specified counseling: Secondary | ICD-10-CM | POA: Insufficient documentation

## 2019-03-26 DIAGNOSIS — Z95828 Presence of other vascular implants and grafts: Secondary | ICD-10-CM

## 2019-03-26 DIAGNOSIS — Z1502 Genetic susceptibility to malignant neoplasm of ovary: Secondary | ICD-10-CM

## 2019-03-26 DIAGNOSIS — E876 Hypokalemia: Secondary | ICD-10-CM

## 2019-03-26 DIAGNOSIS — C50911 Malignant neoplasm of unspecified site of right female breast: Secondary | ICD-10-CM | POA: Diagnosis not present

## 2019-03-26 LAB — COMPREHENSIVE METABOLIC PANEL
ALT: 18 U/L (ref 0–44)
AST: 17 U/L (ref 15–41)
Albumin: 3.8 g/dL (ref 3.5–5.0)
Alkaline Phosphatase: 108 U/L (ref 38–126)
Anion gap: 10 (ref 5–15)
BUN: 7 mg/dL (ref 6–20)
CO2: 23 mmol/L (ref 22–32)
Calcium: 8.7 mg/dL — ABNORMAL LOW (ref 8.9–10.3)
Chloride: 98 mmol/L (ref 98–111)
Creatinine, Ser: 0.54 mg/dL (ref 0.44–1.00)
GFR calc Af Amer: >60 mL/min (ref >60)
GFR calc non Af Amer: >60 mL/min (ref >60)
Glucose, Bld: 185 mg/dL — ABNORMAL HIGH (ref 70–99)
Potassium: 3.3 mmol/L — ABNORMAL LOW (ref 3.5–5.1)
Sodium: 131 mmol/L — ABNORMAL LOW (ref 135–145)
Total Bilirubin: 0.3 mg/dL (ref 0.3–1.2)
Total Protein: 6.2 g/dL — ABNORMAL LOW (ref 6.5–8.1)

## 2019-03-26 LAB — CBC WITH DIFFERENTIAL/PLATELET
Abs Immature Granulocytes: 7 10*3/uL — ABNORMAL HIGH (ref 0.00–0.07)
Band Neutrophils: 13 %
Basophils Absolute: 0 10*3/uL (ref 0.0–0.1)
Basophils Relative: 0 %
Eosinophils Absolute: 0 10*3/uL (ref 0.0–0.5)
Eosinophils Relative: 0 %
HCT: 34.8 % — ABNORMAL LOW (ref 36.0–46.0)
Hemoglobin: 11.3 g/dL — ABNORMAL LOW (ref 12.0–15.0)
Lymphocytes Relative: 9 %
Lymphs Abs: 3.5 10*3/uL (ref 0.7–4.0)
MCH: 29.6 pg (ref 26.0–34.0)
MCHC: 32.5 g/dL (ref 30.0–36.0)
MCV: 91.1 fL (ref 80.0–100.0)
Metamyelocytes Relative: 8 %
Monocytes Absolute: 1.6 10*3/uL — ABNORMAL HIGH (ref 0.1–1.0)
Monocytes Relative: 4 %
Myelocytes: 10 %
Neutro Abs: 26.8 10*3/uL — ABNORMAL HIGH (ref 1.7–7.7)
Neutrophils Relative %: 56 %
Platelets: 252 10*3/uL (ref 150–400)
RBC: 3.82 MIL/uL — ABNORMAL LOW (ref 3.87–5.11)
RDW: 14.1 % (ref 11.5–15.5)
Smear Review: NORMAL
WBC: 38.9 10*3/uL — ABNORMAL HIGH (ref 4.0–10.5)
nRBC: 0.1 % (ref 0.0–0.2)

## 2019-03-26 LAB — MAGNESIUM: Magnesium: 1.8 mg/dL (ref 1.7–2.4)

## 2019-03-26 MED ORDER — HEPARIN SOD (PORK) LOCK FLUSH 100 UNIT/ML IV SOLN
INTRAVENOUS | Status: AC
  Filled 2019-03-26: qty 5

## 2019-03-26 MED ORDER — POTASSIUM CHLORIDE CRYS ER 20 MEQ PO TBCR: 20.0000 meq | EXTENDED_RELEASE_TABLET | Freq: Every day | ORAL | 0 refills | Status: DC

## 2019-03-26 MED ORDER — SODIUM CHLORIDE 0.9% FLUSH
10.0000 mL | Freq: Once | INTRAVENOUS | Status: AC
  Administered 2019-03-26: 10 mL via INTRAVENOUS
  Filled 2019-03-26: qty 10

## 2019-03-26 MED ORDER — HEPARIN SOD (PORK) LOCK FLUSH 100 UNIT/ML IV SOLN
500.0000 [IU] | Freq: Once | INTRAVENOUS | Status: AC
  Administered 2019-03-26: 500 [IU] via INTRAVENOUS
  Filled 2019-03-26: qty 5

## 2019-03-26 MED ORDER — SODIUM CHLORIDE 0.9 % IV SOLN
Freq: Once | INTRAVENOUS | Status: AC
  Filled 2019-03-26: qty 250

## 2019-03-26 NOTE — Progress Notes (Signed)
Hematology/Oncology follow up  note [facility] [phone] [phone]   Patient Care Team: Glean Hess, MD as PCP - General (Internal Medicine) Jannet Mantis, MD (Dermatology)  CHIEF COMPLAINTS/REASON FOR VISIT:  Follow-up for breast cancer.  HISTORY OF PRESENTING ILLNESS:  Cheryl Mooney is a  53 y.o.  female with PMH listed below who was referred to me for evaluation of breast cancer Patient reports feeling mass of her right breast week before her annual mammogram. 12/03/2018 patient had bilateral diagnostic mammogram and ultrasound  which showed suspicious 2.1 x 1.3 x 1.6 irregular mass at 7:00 in the right breast, 2 cm from the nipple. 2 borderline lymph nodes are seen in the right axilla.  1 of the nodes demonstrate a cortex of 4.4 mm.  Both lymph nodes demonstrated retention of fatty hila. Patient underwent ultrasound-guided biopsy of right breast mass as well as ultrasound-guided biopsy of 1 of the 2 mildly abnormal right axillary lymph nodes. 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 Right axilla lymph node negative for malignancy. ER> 90% positive, PR11-50% positive, HER-2 equivocal, 2+ by IHC.  FISH negative.   Nipple discharge: Denies Family history: Mother diagnosed with breast cancer at age of 89, and colon cancer at age of 29.  Mother has BRCA2 mutation.   Maternal grandmother breast cancer, maternal great aunt breast cancer.  Patient recalls that she was tested long time ago and was not aware of any results.  OCP use: In her 20s-30s Estrogen and progesterone therapy: denies History of radiation to chest: denies.  Previous breast surgery: Denies  BRCA 2 positive.  # 01/14/2019 patient underwent bilateral mastectomy with sentinel lymph node biopsy of left and the right axillary. Right breast showed invasive mammary carcinoma, DCIS  positive, apocrine metaplastic, stromal fibrosis, duct ectasia, simple cyst formation, usual epithelial hyperplasia.  Sentinel lymph node on the right side was negative. pT2 pN0 Left prophylactic mastectomy and a sentinel lymph node negative for malignancy.  #OncotypeDX recurrence score 31, adjuvant chemotherapy benefit more than 15%. # She follows up with plastic surgeon Dr. Marla Roe, she has expander, expanding process is being during chemotherapy. Mediport was placed by Dr. Peyton Najjar on 02/18/2019.  INTERVAL HISTORY Cheryl Mooney is a 54 y.o. female who has above history reviewed by me today presents for follow up visit for management of stage I breast cancer, ER PR positive, HER-2 negative, hereditary BRCA 2 mutation.  Problems and complaints are listed below: Patient is status post 1 cycle of docetaxel and carboplatin. She reports having diarrhea which started right after the chemotherapy, initially 7-8 times per day, yesterday and today 2-3 small amount of loose stool episodes.\She has used Imodium which has helped her symptoms.  She denies any fever, chills, nausea, vomiting.  Denies any numbness and tingling.   Review of Systems  Constitutional: Negative for appetite change, chills, fatigue and fever.  HENT:   Negative for hearing loss and voice change.   Eyes: Negative for eye problems.  Respiratory: Negative for chest tightness and cough.   Cardiovascular: Negative for chest pain.  Gastrointestinal: Positive for diarrhea. Negative for abdominal distention, abdominal pain and blood in stool.  Endocrine: Negative for hot flashes.  Genitourinary: Negative for difficulty urinating and frequency.   Musculoskeletal: Negative for arthralgias.  Skin: Negative for itching and rash.  Neurological: Negative for extremity weakness.  Hematological: Negative for adenopathy.  Psychiatric/Behavioral: Negative for confusion. The patient is not  nervous/anxious.     MEDICAL HISTORY:  Past  Medical History:  Diagnosis Date  . Benign neoplasm of sigmoid colon   . Benign neoplasm of transverse colon   . Breast cancer ([place])   . Family history of BRCA2 gene positive   . Family history of breast cancer   . Family history of colon cancer   . Family history of lung cancer   . Family history of pancreatic cancer   . GERD (gastroesophageal reflux disease)   . History of kidney stones   . Hypertension   . Malignant neoplasm of lower-outer quadrant of right breast of female, estrogen receptor positive ([place]) 12/17/2018  . Smoker     SURGICAL HISTORY: Past Surgical History:  Procedure Laterality Date  . AXILLARY SENTINEL NODE BIOPSY Bilateral 01/14/2019   Procedure: AXILLARY SENTINEL NODE BIOPSY;  Surgeon: Herbert Pun, MD;  Location: [facility];  Service: General;  Laterality: Bilateral;  . BREAST BIOPSY Right 12/08/2018   [place] bx of mass with calcs path pending  . BREAST BIOPSY Right 12/08/2018   [place] bx of LN, path pending  . BREAST RECONSTRUCTION WITH PLACEMENT OF TISSUE EXPANDER AND ALLODERM Bilateral 01/14/2019   Procedure: BILATERAL BREAST RECONSTRUCTION WITH PLACEMENT OF TISSUE EXPANDER AND FLEX HD;  Surgeon: Wallace Going, DO;  Location: [facility];  Service: Plastics;  Laterality: Bilateral;  . COLONOSCOPY WITH PROPOFOL N/A 02/17/2015   Procedure: COLONOSCOPY WITH PROPOFOL;  Surgeon: Lucilla Lame, MD;  Location: [place];  Service: Endoscopy;  Laterality: N/A;  . POLYPECTOMY  02/17/2015   Procedure: POLYPECTOMY;  Surgeon: Lucilla Lame, MD;  Location: [place];  Service: Endoscopy;;  . PORTACATH PLACEMENT Left 02/18/2019   Procedure: INSERTION PORT-A-CATH;  Surgeon: Herbert Pun, MD;  Location: [facility];  Service: General;  Laterality: Left;  . SIMPLE MASTECTOMY WITH AXILLARY SENTINEL NODE BIOPSY Left 01/14/2019   Procedure: LEFT SIMPLE MASTECTOMY;  Surgeon: Herbert Pun, MD;  Location: [facility];  Service: General;  Laterality:  Left;  . TOTAL MASTECTOMY Right 01/14/2019   Procedure: RIGHT TOTAL MASTECTOMY;  Surgeon: Herbert Pun, MD;  Location: [facility];  Service: General;  Laterality: Right;  . WISDOM TOOTH EXTRACTION      SOCIAL HISTORY: Social History   Socioeconomic History  . Marital status: Married    Spouse name: Not on file  . Number of children: Not on file  . Years of education: Not on file  . Highest education level: Not on file  Occupational History  . Not on file  Tobacco Use  . Smoking status: Current Every Day Smoker    Packs/day: 0.25    Years: 20.00    Pack years: 5.00    Types: Cigarettes  . Smokeless tobacco: Never Used  . Tobacco comment: 3 cig. per day  Substance and Sexual Activity  . Alcohol use: Yes    Alcohol/week: 2.0 standard drinks    Types: 2 Standard drinks or equivalent per week    Comment: occasional  . Drug use: No  . Sexual activity: Yes    Birth control/protection: Post-menopausal  Other Topics Concern  . Not on file  Social History Narrative  . Not on file   Social Determinants of Health   Financial Resource Strain:   . Difficulty of Paying Living Expenses: Not on file  Food Insecurity:   . Worried About Charity fundraiser in the Last Year: Not on file  . Ran Out of Food in the Last Year: Not on file  Transportation Needs:   . Film/video editor (Medical): Not on file  . Lack of Transportation (Non-Medical): Not on file  Physical Activity:   . Days of Exercise per Week: Not on file  . Minutes of Exercise per Session: Not on file  Stress:   . Feeling of Stress : Not on file  Social Connections:   . Frequency of Communication with Friends and Family: Not on file  . Frequency of Social Gatherings with Friends and Family: Not on file  . Attends Religious Services: Not on file  . Active Member of Clubs or Organizations: Not on file  . Attends Archivist Meetings: Not on file  . Marital Status: Not on file  Intimate Partner  Violence:   . Fear of Current or Ex-Partner: Not on file  . Emotionally Abused: Not on file  . Physically Abused: Not on file  . Sexually Abused: Not on file  She is a retired Pharmacist, hospital  FAMILY HISTORY: Family History  Problem Relation Age of Onset  . Colon cancer Mother 66  . Breast cancer Mother 23       "BRCA2+"  . Hypertension Father   . Stroke Father   . Alzheimer's disease Father   . Prostate cancer Father   . Breast cancer Maternal Aunt        mat great aunt  . Breast cancer Maternal Grandmother 16  . Pancreatic cancer Maternal Grandfather 60    ALLERGIES:  has No Known Allergies.  MEDICATIONS:  Current Outpatient Medications  Medication Sig Dispense Refill  . acetaminophen (TYLENOL) 500 MG tablet Take 500 mg by mouth every 6 (six) hours as needed for mild pain.     Marland Kitchen dexamethasone (DECADRON) 4 MG tablet Take 2 tablets (8 mg total) by mouth 2 (two) times daily. Take decadron  Twice daily the day before taxotere then daily x 3 days after carboplatin  chemo 30 tablet 1  . DULoxetine (CYMBALTA) 60 MG capsule Take 1 capsule (60 mg total) by mouth daily. 90 capsule 3  . lisinopril-hydrochlorothiazide (ZESTORETIC) 20-25 MG tablet Take 1 tablet by mouth daily. 90 tablet 3  . loratadine (CLARITIN) 10 MG tablet Take 10 mg by mouth daily.    Marland Kitchen omeprazole (PRILOSEC) 40 MG capsule Take 1 capsule (40 mg total) by mouth daily. 90 capsule 1  . ondansetron (ZOFRAN) 8 MG tablet Take 1 tablet (8 mg total) by mouth 2 (two) times daily as needed for refractory nausea / vomiting. Start on day 3 after chemo. 30 tablet 1  . prochlorperazine (COMPAZINE) 10 MG tablet Take 1 tablet (10 mg total) by mouth every 6 (six) hours as needed (Nausea or vomiting). 30 tablet 1  . bisacodyl (DULCOLAX) 5 MG EC tablet Take 5 mg by mouth daily as needed for mild constipation or moderate constipation.    . cetirizine (ZYRTEC) 10 MG tablet Take 10 mg by mouth daily.    . diazepam (VALIUM) 2 MG tablet Take 1 tablet  (2 mg total) by mouth every 12 (twelve) hours as needed for muscle spasms. (Patient not taking: Reported on 03/11/2019) 20 tablet 0  . HYDROcodone-acetaminophen (NORCO/VICODIN) 5-325 MG tablet Take 1 tablet by mouth every 6 (six) hours as needed for moderate pain.    . potassium chloride SA (KLOR-CON) 20 MEQ tablet Take 1 tablet (20 mEq total) by mouth daily. 18 tablet 0   No current facility-administered medications for this visit.     PHYSICAL EXAMINATION: ECOG PERFORMANCE STATUS: 0 - Asymptomatic  Vitals:   03/26/19 1052  BP: 114/75  Pulse: (!) 112  Resp: 16  Temp: 98.7 F (37.1 C)  SpO2: 94%   Filed Weights   03/26/19 1052  Weight: 155 lb 8 oz (70.5 kg)    Physical Exam Constitutional:      General: She is not in acute distress. HENT:     Head: Normocephalic and atraumatic.  Eyes:     General: No scleral icterus.    Pupils: Pupils are equal, round, and reactive to light.  Cardiovascular:     Rate and Rhythm: Normal rate and regular rhythm.     Heart sounds: Normal heart sounds.  Pulmonary:     Effort: Pulmonary effort is normal. No respiratory distress.     Breath sounds: No wheezing.  Abdominal:     General: Bowel sounds are normal. There is no distension.     Palpations: Abdomen is soft. There is no mass.     Tenderness: There is no abdominal tenderness.  Musculoskeletal:        General: No deformity. Normal range of motion.     Cervical back: Normal range of motion and neck supple.  Skin:    General: Skin is warm and dry.     Findings: No erythema or rash.  Neurological:     Mental Status: She is alert and oriented to person, place, and time.     Cranial Nerves: No cranial nerve deficit.     Coordination: Coordination normal.  Psychiatric:        Behavior: Behavior normal.        Thought Content: Thought content normal.    LABORATORY DATA:  I have reviewed the data as listed Lab Results  Component Value Date   WBC 38.9 (H) 03/26/2019   HGB 11.3 (L)  03/26/2019   HCT 34.8 (L) 03/26/2019   MCV 91.1 03/26/2019   PLT 252 03/26/2019   Recent Labs    03/12/19 0822 03/18/19 0808 03/26/19 1044  NA 135 133* 131*  K 3.7 3.4* 3.3*  CL 102 102 98  CO2 26 23 23   GLUCOSE 119* 146* 185*  BUN 10 15 7   CREATININE 0.56 0.55 0.54  CALCIUM 9.3 9.5 8.7*  GFRNONAA >60 >60 >60  GFRAA >60 >60 >60  PROT 7.2 7.5 6.2*  ALBUMIN 4.3 4.2 3.8  AST 14* 14* 17  ALT 14 13 18   ALKPHOS 120 90 108  BILITOT 0.3 0.4 0.3   Iron/TIBC/Ferritin/ %Sat No results found for: IRON, TIBC, FERRITIN, IRONPCTSAT   RADIOGRAPHIC STUDIES: I have personally reviewed the radiological images as listed and agreed with the findings in the report. X-ray chest PA or AP  Result Date: 02/18/2019 CLINICAL DATA:  53 year old female with Port-A-Cath placement. EXAM: CHEST  1 VIEW COMPARISON:  None. FINDINGS: Left-sided Port-A-Cath with tip at the cavoatrial junction. No pneumothorax. Mild diffuse interstitial prominence. No focal consolidation, or pleural effusion. The cardiac silhouette is within normal limits. No acute osseous pathology. Bilateral breast implants or tissue expanders noted. IMPRESSION: Port-A-Cath with tip at the cavoatrial junction. No pneumothorax. Electronically Signed   By: Anner Crete M.D.   On: 02/18/2019 13:19   NM Cardiac Muga Rest  Result Date: 02/11/2019 CLINICAL DATA:  Breast cancer. Evaluate cardiac function in relation to chemotherapy. EXAM: NUCLEAR MEDICINE CARDIAC BLOOD POOL IMAGING (MUGA) TECHNIQUE: Cardiac multi-gated acquisition was performed at rest following intravenous injection of Tc-31mlabeled red blood cells. RADIOPHARMACEUTICALS:  21.8 mCi Tc-944mertechnetate in-vitro labeled red blood cells IV  COMPARISON:  None. FINDINGS: No  focal wall motion abnormality of the left ventricle. Calculated left ventricular ejection fraction equals 59.3% IMPRESSION: Left ventricular ejection fraction equals59 %. Electronically Signed   By: Suzy Bouchard  M.D.   On: 02/11/2019 16:10   NM SENTINEL NODE INJECTION  Result Date: 01/14/2019 CLINICAL DATA:  Right breast cancer. EXAM: NUCLEAR MEDICINE BREAST LYMPHOSCINTIGRAPHY TECHNIQUE: Intradermal injection of radiopharmaceutical was performed at the 12 o'clock, 3 o'clock, 6 o'clock, and 9 o'clock positions around the right nipple. The patient was then sent to the operating room where the sentinel node(s) were identified and removed by the surgeon. RADIOPHARMACEUTICALS:  Total of 1 mCi Millipore-filtered Technetium-63msulfur colloid, injected in four aliquots of 0.25 mCi each. IMPRESSION: Uncomplicated intradermal injection of a total of 1 mCi Technetium-943mulfur colloid for purposes of sentinel node identification. Electronically Signed   By: MaInez Catalina.D.   On: 01/14/2019 09:11   NM SENTINEL NODE INJECTION  Result Date: 01/14/2019 CLINICAL DATA:  Left breast cancer. EXAM: NUCLEAR MEDICINE BREAST LYMPHOSCINTIGRAPHY TECHNIQUE: Intradermal injection of radiopharmaceutical was performed at the 12 o'clock, 3 o'clock, 6 o'clock, and 9 o'clock positions around the left nipple. The patient was then sent to the operating room where the sentinel node(s) were identified and removed by the surgeon. RADIOPHARMACEUTICALS:  Total of 1 mCi Millipore-filtered Technetium-9961mlfur colloid, injected in four aliquots of 0.25 mCi each. IMPRESSION: Uncomplicated intradermal injection of a total of 1 mCi Technetium-43m68mfur colloid for purposes of sentinel node identification. Electronically Signed   By: MarkInez Catalina.   On: 01/14/2019 09:11   DG C-Arm 1-60 Min-No Report  Result Date: 02/18/2019 Fluoroscopy was utilized by the requesting physician.  No radiographic interpretation.      ASSESSMENT & PLAN:  1. Malignant neoplasm of right breast in female, estrogen receptor positive, unspecified site of breast (HCC)[place]2. BRCA2 gene mutation positive in female   3. Hyponatremia   4. Diarrhea, unspecified type      Stage IB pT2 pN0 M0 ER + PR+ HER2 negative right breast cancer, grade 3, +LVI,   -BRCA2 positive Oncotype 31. Patient is status post 1 cycle of docetaxel and carboplatin. Overall tolerates well. Labs reviewed and discussed with patient. Counts are stable.  #Diarrhea, likely chemotherapy-induced.  Symptom is getting better.  Advised patient to continue Imodium as instructed.  If symptoms get worse, she will update me.  Hyponatremia, sodium has improved to 131 today.  Proceed with 1 L of normal saline IV Hypokalemia, increase oral potassium chloride to 20 meq daily.   #BRCA gene mutation positive. She has establish care with gynecology for further evaluation. Screening for pancreatic cancer is controversial.  We will further discuss in the future once he finished chemotherapy..     All questions were answered. The patient knows to call the clinic with any problems questions or concerns.    ZhouEarlie Server, PhD Hematology Oncology [facility]- [phone]

## 2019-03-26 NOTE — Progress Notes (Signed)
Patient reports diarrhea this week and Imodium is not helping.

## 2019-04-12 ENCOUNTER — Inpatient Hospital Stay (HOSPITAL_BASED_OUTPATIENT_CLINIC_OR_DEPARTMENT_OTHER): Payer: BC Managed Care – PPO | Admitting: Oncology

## 2019-04-12 ENCOUNTER — Inpatient Hospital Stay: Payer: BC Managed Care – PPO

## 2019-04-12 ENCOUNTER — Other Ambulatory Visit: Payer: Self-pay

## 2019-04-12 ENCOUNTER — Inpatient Hospital Stay: Payer: BC Managed Care – PPO | Attending: Oncology

## 2019-04-12 ENCOUNTER — Encounter: Payer: Self-pay | Admitting: Oncology

## 2019-04-12 VITALS — BP 112/78 | HR 88 | Temp 97.8°F | Resp 18 | Wt 155.4 lb

## 2019-04-12 DIAGNOSIS — C50919 Malignant neoplasm of unspecified site of unspecified female breast: Secondary | ICD-10-CM

## 2019-04-12 DIAGNOSIS — Z5111 Encounter for antineoplastic chemotherapy: Secondary | ICD-10-CM | POA: Insufficient documentation

## 2019-04-12 DIAGNOSIS — K219 Gastro-esophageal reflux disease without esophagitis: Secondary | ICD-10-CM | POA: Insufficient documentation

## 2019-04-12 DIAGNOSIS — E871 Hypo-osmolality and hyponatremia: Secondary | ICD-10-CM | POA: Diagnosis not present

## 2019-04-12 DIAGNOSIS — Z170 Estrogen receptor positive status [ER+]: Secondary | ICD-10-CM

## 2019-04-12 DIAGNOSIS — Z1501 Genetic susceptibility to malignant neoplasm of breast: Secondary | ICD-10-CM

## 2019-04-12 DIAGNOSIS — R197 Diarrhea, unspecified: Secondary | ICD-10-CM

## 2019-04-12 DIAGNOSIS — Z8249 Family history of ischemic heart disease and other diseases of the circulatory system: Secondary | ICD-10-CM | POA: Diagnosis not present

## 2019-04-12 DIAGNOSIS — Z79899 Other long term (current) drug therapy: Secondary | ICD-10-CM | POA: Diagnosis not present

## 2019-04-12 DIAGNOSIS — F17210 Nicotine dependence, cigarettes, uncomplicated: Secondary | ICD-10-CM | POA: Diagnosis not present

## 2019-04-12 DIAGNOSIS — Z1502 Genetic susceptibility to malignant neoplasm of ovary: Secondary | ICD-10-CM

## 2019-04-12 DIAGNOSIS — Z803 Family history of malignant neoplasm of breast: Secondary | ICD-10-CM | POA: Diagnosis not present

## 2019-04-12 DIAGNOSIS — Z7952 Long term (current) use of systemic steroids: Secondary | ICD-10-CM | POA: Insufficient documentation

## 2019-04-12 DIAGNOSIS — Z801 Family history of malignant neoplasm of trachea, bronchus and lung: Secondary | ICD-10-CM | POA: Diagnosis not present

## 2019-04-12 DIAGNOSIS — Z1509 Genetic susceptibility to other malignant neoplasm: Secondary | ICD-10-CM

## 2019-04-12 DIAGNOSIS — D649 Anemia, unspecified: Secondary | ICD-10-CM | POA: Insufficient documentation

## 2019-04-12 DIAGNOSIS — Z5189 Encounter for other specified aftercare: Secondary | ICD-10-CM | POA: Insufficient documentation

## 2019-04-12 DIAGNOSIS — R739 Hyperglycemia, unspecified: Secondary | ICD-10-CM

## 2019-04-12 DIAGNOSIS — C50911 Malignant neoplasm of unspecified site of right female breast: Secondary | ICD-10-CM

## 2019-04-12 DIAGNOSIS — E162 Hypoglycemia, unspecified: Secondary | ICD-10-CM | POA: Diagnosis not present

## 2019-04-12 DIAGNOSIS — C50511 Malignant neoplasm of lower-outer quadrant of right female breast: Secondary | ICD-10-CM | POA: Insufficient documentation

## 2019-04-12 LAB — CBC WITH DIFFERENTIAL/PLATELET
Abs Immature Granulocytes: 0.03 10*3/uL (ref 0.00–0.07)
Basophils Absolute: 0 10*3/uL (ref 0.0–0.1)
Basophils Relative: 0 %
Eosinophils Absolute: 0 10*3/uL (ref 0.0–0.5)
Eosinophils Relative: 0 %
HCT: 34.7 % — ABNORMAL LOW (ref 36.0–46.0)
Hemoglobin: 11 g/dL — ABNORMAL LOW (ref 12.0–15.0)
Immature Granulocytes: 1 %
Lymphocytes Relative: 10 %
Lymphs Abs: 0.6 10*3/uL — ABNORMAL LOW (ref 0.7–4.0)
MCH: 29.3 pg (ref 26.0–34.0)
MCHC: 31.7 g/dL (ref 30.0–36.0)
MCV: 92.3 fL (ref 80.0–100.0)
Monocytes Absolute: 0.1 10*3/uL (ref 0.1–1.0)
Monocytes Relative: 1 %
Neutro Abs: 5.1 10*3/uL (ref 1.7–7.7)
Neutrophils Relative %: 88 %
Platelets: 368 10*3/uL (ref 150–400)
RBC: 3.76 MIL/uL — ABNORMAL LOW (ref 3.87–5.11)
RDW: 15.1 % (ref 11.5–15.5)
WBC: 5.8 10*3/uL (ref 4.0–10.5)
nRBC: 0 % (ref 0.0–0.2)

## 2019-04-12 LAB — COMPREHENSIVE METABOLIC PANEL
ALT: 13 U/L (ref 0–44)
AST: 24 U/L (ref 15–41)
Albumin: 4 g/dL (ref 3.5–5.0)
Alkaline Phosphatase: 96 U/L (ref 38–126)
Anion gap: 12 (ref 5–15)
BUN: 11 mg/dL (ref 6–20)
CO2: 20 mmol/L — ABNORMAL LOW (ref 22–32)
Calcium: 9.3 mg/dL (ref 8.9–10.3)
Chloride: 101 mmol/L (ref 98–111)
Creatinine, Ser: 0.69 mg/dL (ref 0.44–1.00)
GFR calc Af Amer: >60 mL/min (ref >60)
GFR calc non Af Amer: >60 mL/min (ref >60)
Glucose, Bld: 280 mg/dL — ABNORMAL HIGH (ref 70–99)
Potassium: 3.7 mmol/L (ref 3.5–5.1)
Sodium: 133 mmol/L — ABNORMAL LOW (ref 135–145)
Total Bilirubin: 0.4 mg/dL (ref 0.3–1.2)
Total Protein: 7.4 g/dL (ref 6.5–8.1)

## 2019-04-12 MED ORDER — HEPARIN SOD (PORK) LOCK FLUSH 100 UNIT/ML IV SOLN
INTRAVENOUS | Status: AC
  Filled 2019-04-12: qty 5

## 2019-04-12 MED ORDER — SODIUM CHLORIDE 0.9 % IV SOLN
Freq: Once | INTRAVENOUS | Status: AC
  Filled 2019-04-12: qty 250

## 2019-04-12 MED ORDER — SODIUM CHLORIDE 0.9 % IV SOLN
565.5000 mg | Freq: Once | INTRAVENOUS | Status: AC
  Administered 2019-04-12: 570 mg via INTRAVENOUS
  Filled 2019-04-12: qty 57

## 2019-04-12 MED ORDER — HEPARIN SOD (PORK) LOCK FLUSH 100 UNIT/ML IV SOLN
500.0000 [IU] | Freq: Once | INTRAVENOUS | Status: DC | PRN
  Filled 2019-04-12: qty 5

## 2019-04-12 MED ORDER — SODIUM CHLORIDE 0.9 % IV SOLN
Freq: Once | INTRAVENOUS | Status: AC
  Filled 2019-04-12: qty 5

## 2019-04-12 MED ORDER — HEPARIN SOD (PORK) LOCK FLUSH 100 UNIT/ML IV SOLN
500.0000 [IU] | Freq: Once | INTRAVENOUS | Status: AC
  Administered 2019-04-12: 500 [IU] via INTRAVENOUS
  Filled 2019-04-12: qty 5

## 2019-04-12 MED ORDER — POTASSIUM CHLORIDE ER 10 MEQ PO TBCR: 10.0000 meq | EXTENDED_RELEASE_TABLET | Freq: Every day | ORAL | 0 refills | Status: DC

## 2019-04-12 MED ORDER — SODIUM CHLORIDE 0.9 % IV SOLN
75.0000 mg/m2 | Freq: Once | INTRAVENOUS | Status: AC
  Administered 2019-04-12: 130 mg via INTRAVENOUS
  Filled 2019-04-12: qty 13

## 2019-04-12 MED ORDER — PALONOSETRON HCL INJECTION 0.25 MG/5ML
0.2500 mg | Freq: Once | INTRAVENOUS | Status: AC
  Administered 2019-04-12: 0.25 mg via INTRAVENOUS
  Filled 2019-04-12: qty 5

## 2019-04-12 MED ORDER — SODIUM CHLORIDE 0.9% FLUSH
10.0000 mL | Freq: Once | INTRAVENOUS | Status: AC
  Administered 2019-04-12: 08:00:00 10 mL via INTRAVENOUS
  Filled 2019-04-12: qty 10

## 2019-04-12 NOTE — Progress Notes (Signed)
Hematology/Oncology follow up  note [facility] Telephone:([phone] Fax:([phone]) [phone]   Patient Care Team: Glean Hess, MD as PCP - General (Internal Medicine) Jannet Mantis, MD (Dermatology)  CHIEF COMPLAINTS/REASON FOR VISIT:  Follow-up for breast cancer.  HISTORY OF PRESENTING ILLNESS:  Cheryl Mooney is a  54 y.o.  female with PMH listed below who was referred to me for evaluation of breast cancer Patient reports feeling mass of her right breast week before her annual mammogram. 12/03/2018 patient had bilateral diagnostic mammogram and ultrasound  which showed suspicious 2.1 x 1.3 x 1.6 irregular mass at 7:00 in the right breast, 2 cm from the nipple. 2 borderline lymph nodes are seen in the right axilla.  1 of the nodes demonstrate a cortex of 4.4 mm.  Both lymph nodes demonstrated retention of fatty hila. Patient underwent ultrasound-guided biopsy of right breast mass as well as ultrasound-guided biopsy of 1 of the 2 mildly abnormal right axillary lymph nodes. 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 Right axilla lymph node negative for malignancy. ER> 90% positive, PR11-50% positive, HER-2 equivocal, 2+ by IHC.  FISH negative.   Nipple discharge: Denies Family history: Mother diagnosed with breast cancer at age of 4, and colon cancer at age of 82.  Mother has BRCA2 mutation.   Maternal grandmother breast cancer, maternal great aunt breast cancer.  Patient recalls that she was tested long time ago and was not aware of any results.  OCP use: In her 20s-30s Estrogen and progesterone therapy: denies History of radiation to chest: denies.  Previous breast surgery: Denies  BRCA 2 positive.  # 01/14/2019 patient underwent bilateral mastectomy with sentinel lymph node biopsy of left and the right axillary. Right breast showed invasive mammary carcinoma, DCIS  positive, apocrine metaplastic, stromal fibrosis, duct ectasia, simple cyst formation, usual epithelial hyperplasia.  Sentinel lymph node on the right side was negative. pT2 pN0 Left prophylactic mastectomy and a sentinel lymph node negative for malignancy.  #OncotypeDX recurrence score 31, adjuvant chemotherapy benefit more than 15%. # She follows up with plastic surgeon Dr. Marla Mooney, she has expander, expanding process is being during chemotherapy. Mediport was placed by Dr. Peyton Najjar on 02/18/2019.  INTERVAL HISTORY Cheryl Mooney is a 54 y.o. female who has above history reviewed by me today presents for follow up visit for management of stage I breast cancer, ER PR positive, HER-2 negative, hereditary BRCA 2 mutation.  Problems and complaints are listed below: Patient is status post 1 cycle of docetaxel and carboplatin. Patient has experienced 2 to 4 days of intermittent diarrhea.  Got IV fluid.  She uses Imodium as instructed and if symptoms resolved. Today she has no diarrhea. No nausea, vomiting, fever, chills. No numbness or tingling .   Review of Systems  Constitutional: Negative for appetite change, chills, fatigue and fever.  HENT:   Negative for hearing loss and voice change.   Eyes: Negative for eye problems.  Respiratory: Negative for chest tightness and cough.   Cardiovascular: Negative for chest pain.  Gastrointestinal: Negative for abdominal distention, abdominal pain, blood in stool and diarrhea.  Endocrine: Negative for hot flashes.  Genitourinary: Negative for difficulty urinating and frequency.   Musculoskeletal: Negative for arthralgias.  Skin: Negative for itching and rash.  Neurological: Negative for extremity weakness.  Hematological: Negative for adenopathy.  Psychiatric/Behavioral: Negative for confusion. The patient is not nervous/anxious.     MEDICAL HISTORY:  Past Medical  History:  Diagnosis Date  . Benign neoplasm of sigmoid colon   . Benign  neoplasm of transverse colon   . Breast cancer ([place])   . Family history of BRCA2 gene positive   . Family history of breast cancer   . Family history of colon cancer   . Family history of lung cancer   . Family history of pancreatic cancer   . GERD (gastroesophageal reflux disease)   . History of kidney stones   . Hypertension   . Malignant neoplasm of lower-outer quadrant of right breast of female, estrogen receptor positive ([place]) 12/17/2018  . Smoker     SURGICAL HISTORY: Past Surgical History:  Procedure Laterality Date  . AXILLARY SENTINEL NODE BIOPSY Bilateral 01/14/2019   Procedure: AXILLARY SENTINEL NODE BIOPSY;  Surgeon: Herbert Pun, MD;  Location: [facility];  Service: General;  Laterality: Bilateral;  . BREAST BIOPSY Right 12/08/2018   [place] bx of mass with calcs path pending  . BREAST BIOPSY Right 12/08/2018   [place] bx of LN, path pending  . BREAST RECONSTRUCTION WITH PLACEMENT OF TISSUE EXPANDER AND ALLODERM Bilateral 01/14/2019   Procedure: BILATERAL BREAST RECONSTRUCTION WITH PLACEMENT OF TISSUE EXPANDER AND FLEX HD;  Surgeon: Wallace Going, DO;  Location: [facility];  Service: Plastics;  Laterality: Bilateral;  . COLONOSCOPY WITH PROPOFOL N/A 02/17/2015   Procedure: COLONOSCOPY WITH PROPOFOL;  Surgeon: Lucilla Lame, MD;  Location: [place];  Service: Endoscopy;  Laterality: N/A;  . POLYPECTOMY  02/17/2015   Procedure: POLYPECTOMY;  Surgeon: Lucilla Lame, MD;  Location: [place];  Service: Endoscopy;;  . PORTACATH PLACEMENT Left 02/18/2019   Procedure: INSERTION PORT-A-CATH;  Surgeon: Herbert Pun, MD;  Location: [facility];  Service: General;  Laterality: Left;  . SIMPLE MASTECTOMY WITH AXILLARY SENTINEL NODE BIOPSY Left 01/14/2019   Procedure: LEFT SIMPLE MASTECTOMY;  Surgeon: Herbert Pun, MD;  Location: [facility];  Service: General;  Laterality: Left;  . TOTAL MASTECTOMY Right 01/14/2019   Procedure: RIGHT TOTAL MASTECTOMY;   Surgeon: Herbert Pun, MD;  Location: [facility];  Service: General;  Laterality: Right;  . WISDOM TOOTH EXTRACTION      SOCIAL HISTORY: Social History   Socioeconomic History  . Marital status: Married    Spouse name: Not on file  . Number of children: Not on file  . Years of education: Not on file  . Highest education level: Not on file  Occupational History  . Not on file  Tobacco Use  . Smoking status: Current Every Day Smoker    Packs/day: 0.25    Years: 20.00    Pack years: 5.00    Types: Cigarettes  . Smokeless tobacco: Never Used  . Tobacco comment: 3 cig. per day  Substance and Sexual Activity  . Alcohol use: Yes    Alcohol/week: 2.0 standard drinks    Types: 2 Standard drinks or equivalent per week    Comment: occasional  . Drug use: No  . Sexual activity: Yes    Birth control/protection: Post-menopausal  Other Topics Concern  . Not on file  Social History Narrative  . Not on file   Social Determinants of Health   Financial Resource Strain:   . Difficulty of Paying Living Expenses: Not on file  Food Insecurity:   . Worried About Charity fundraiser in the Last Year: Not on file  . Ran Out of Food in the Last Year: Not on file  Transportation Needs:   . Lack of Transportation (Medical): Not  on file  . Lack of Transportation (Non-Medical): Not on file  Physical Activity:   . Days of Exercise per Week: Not on file  . Minutes of Exercise per Session: Not on file  Stress:   . Feeling of Stress : Not on file  Social Connections:   . Frequency of Communication with Friends and Family: Not on file  . Frequency of Social Gatherings with Friends and Family: Not on file  . Attends Religious Services: Not on file  . Active Member of Clubs or Organizations: Not on file  . Attends Archivist Meetings: Not on file  . Marital Status: Not on file  Intimate Partner Violence:   . Fear of Current or Ex-Partner: Not on file  . Emotionally Abused: Not  on file  . Physically Abused: Not on file  . Sexually Abused: Not on file  She is a retired Pharmacist, hospital  FAMILY HISTORY: Family History  Problem Relation Age of Onset  . Colon cancer Mother 61  . Breast cancer Mother 35       "BRCA2+"  . Hypertension Father   . Stroke Father   . Alzheimer's disease Father   . Prostate cancer Father   . Breast cancer Maternal Aunt        mat great aunt  . Breast cancer Maternal Grandmother 83  . Pancreatic cancer Maternal Grandfather 60    ALLERGIES:  has No Known Allergies.  MEDICATIONS:  Current Outpatient Medications  Medication Sig Dispense Refill  . acetaminophen (TYLENOL) 500 MG tablet Take 500 mg by mouth every 6 (six) hours as needed for mild pain.     . bisacodyl (DULCOLAX) 5 MG EC tablet Take 5 mg by mouth daily as needed for mild constipation or moderate constipation.    . cetirizine (ZYRTEC) 10 MG tablet Take 10 mg by mouth daily.    Marland Kitchen dexamethasone (DECADRON) 4 MG tablet Take 2 tablets (8 mg total) by mouth 2 (two) times daily. Take decadron  Twice daily the day before taxotere then daily x 3 days after carboplatin  chemo 30 tablet 1  . DULoxetine (CYMBALTA) 60 MG capsule Take 1 capsule (60 mg total) by mouth daily. 90 capsule 3  . lisinopril-hydrochlorothiazide (ZESTORETIC) 20-25 MG tablet Take 1 tablet by mouth daily. 90 tablet 3  . loratadine (CLARITIN) 10 MG tablet Take 10 mg by mouth daily.    Marland Kitchen omeprazole (PRILOSEC) 40 MG capsule Take 1 capsule (40 mg total) by mouth daily. 90 capsule 1  . ondansetron (ZOFRAN) 8 MG tablet Take 1 tablet (8 mg total) by mouth 2 (two) times daily as needed for refractory nausea / vomiting. Start on day 3 after chemo. 30 tablet 1  . prochlorperazine (COMPAZINE) 10 MG tablet Take 1 tablet (10 mg total) by mouth every 6 (six) hours as needed (Nausea or vomiting). 30 tablet 1  . diazepam (VALIUM) 2 MG tablet Take 1 tablet (2 mg total) by mouth every 12 (twelve) hours as needed for muscle spasms. (Patient  not taking: Reported on 03/11/2019) 20 tablet 0  . HYDROcodone-acetaminophen (NORCO/VICODIN) 5-325 MG tablet Take 1 tablet by mouth every 6 (six) hours as needed for moderate pain.    . potassium chloride (KLOR-CON) 10 MEQ tablet Take 1 tablet (10 mEq total) by mouth daily. 14 tablet 0   No current facility-administered medications for this visit.   Facility-Administered Medications Ordered in Other Visits  Medication Dose Route Frequency Provider Last Rate Last Admin  . CARBOplatin (PARAPLATIN) 570  mg in sodium chloride 0.9 % 250 mL chemo infusion  570 mg Intravenous Once Earlie Server, MD      . DOCEtaxel (TAXOTERE) 130 mg in sodium chloride 0.9 % 250 mL chemo infusion  75 mg/m2 (Treatment Plan Recorded) Intravenous Once Earlie Server, MD      . fosaprepitant (EMEND) 150 mg, dexamethasone (DECADRON) 12 mg in sodium chloride 0.9 % 145 mL IVPB   Intravenous Once Earlie Server, MD      . heparin lock flush 100 unit/mL  500 Units Intravenous Once Earlie Server, MD      . heparin lock flush 100 unit/mL  500 Units Intracatheter Once PRN Earlie Server, MD         PHYSICAL EXAMINATION: ECOG PERFORMANCE STATUS: 0 - Asymptomatic Vitals:   04/12/19 0841  BP: 112/78  Pulse: 88  Resp: 18  Temp: 97.8 F (36.6 C)   Filed Weights   04/12/19 0841  Weight: 155 lb 6.4 oz (70.5 kg)    Physical Exam Constitutional:      General: She is not in acute distress. HENT:     Head: Normocephalic and atraumatic.  Eyes:     General: No scleral icterus.    Pupils: Pupils are equal, round, and reactive to light.  Cardiovascular:     Rate and Rhythm: Normal rate and regular rhythm.     Heart sounds: Normal heart sounds.  Pulmonary:     Effort: Pulmonary effort is normal. No respiratory distress.     Breath sounds: No wheezing.  Abdominal:     General: Bowel sounds are normal. There is no distension.     Palpations: Abdomen is soft. There is no mass.     Tenderness: There is no abdominal tenderness.  Musculoskeletal:         General: No deformity. Normal range of motion.     Cervical back: Normal range of motion and neck supple.  Skin:    General: Skin is warm and dry.     Findings: No erythema or rash.  Neurological:     Mental Status: She is alert and oriented to person, place, and time.     Cranial Nerves: No cranial nerve deficit.     Coordination: Coordination normal.  Psychiatric:        Behavior: Behavior normal.        Thought Content: Thought content normal.    LABORATORY DATA:  I have reviewed the data as listed Lab Results  Component Value Date   WBC 5.8 04/12/2019   HGB 11.0 (L) 04/12/2019   HCT 34.7 (L) 04/12/2019   MCV 92.3 04/12/2019   PLT 368 04/12/2019   Recent Labs    03/18/19 0808 03/26/19 1044 04/12/19 0814  NA 133* 131* 133*  K 3.4* 3.3* 3.7  CL 102 98 101  CO2 23 23 20*  GLUCOSE 146* 185* 280*  BUN 15 7 11   CREATININE 0.55 0.54 0.69  CALCIUM 9.5 8.7* 9.3  GFRNONAA >60 >60 >60  GFRAA >60 >60 >60  PROT 7.5 6.2* 7.4  ALBUMIN 4.2 3.8 4.0  AST 14* 17 24  ALT 13 18 13   ALKPHOS 90 108 96  BILITOT 0.4 0.3 0.4   Iron/TIBC/Ferritin/ %Sat No results found for: IRON, TIBC, FERRITIN, IRONPCTSAT   RADIOGRAPHIC STUDIES: I have personally reviewed the radiological images as listed and agreed with the findings in the report. X-ray chest PA or AP  Result Date: 02/18/2019 CLINICAL DATA:  54 year old female with Port-A-Cath placement. EXAM: CHEST  1 VIEW COMPARISON:  None. FINDINGS: Left-sided Port-A-Cath with tip at the cavoatrial junction. No pneumothorax. Mild diffuse interstitial prominence. No focal consolidation, or pleural effusion. The cardiac silhouette is within normal limits. No acute osseous pathology. Bilateral breast implants or tissue expanders noted. IMPRESSION: Port-A-Cath with tip at the cavoatrial junction. No pneumothorax. Electronically Signed   By: Anner Crete M.D.   On: 02/18/2019 13:19   NM Cardiac [place] Rest  Result Date: 02/11/2019 CLINICAL DATA:   Breast cancer. Evaluate cardiac function in relation to chemotherapy. EXAM: NUCLEAR MEDICINE CARDIAC BLOOD POOL IMAGING (MUGA) TECHNIQUE: Cardiac multi-gated acquisition was performed at rest following intravenous injection of Tc-71mlabeled red blood cells. RADIOPHARMACEUTICALS:  21.8 mCi Tc-959mertechnetate in-vitro labeled red blood cells IV COMPARISON:  None. FINDINGS: No  focal wall motion abnormality of the left ventricle. Calculated left ventricular ejection fraction equals 59.3% IMPRESSION: Left ventricular ejection fraction equals59 %. Electronically Signed   By: StSuzy Bouchard.D.   On: 02/11/2019 16:10   NM SENTINEL NODE INJECTION  Result Date: 01/14/2019 CLINICAL DATA:  Right breast cancer. EXAM: NUCLEAR MEDICINE BREAST LYMPHOSCINTIGRAPHY TECHNIQUE: Intradermal injection of radiopharmaceutical was performed at the 12 o'clock, 3 o'clock, 6 o'clock, and 9 o'clock positions around the right nipple. The patient was then sent to the operating room where the sentinel node(s) were identified and removed by the surgeon. RADIOPHARMACEUTICALS:  Total of 1 mCi Millipore-filtered Technetium-9919mlfur colloid, injected in four aliquots of 0.25 mCi each. IMPRESSION: Uncomplicated intradermal injection of a total of 1 mCi Technetium-13m78mfur colloid for purposes of sentinel node identification. Electronically Signed   By: MarkInez Catalina.   On: 01/14/2019 09:11   NM SENTINEL NODE INJECTION  Result Date: 01/14/2019 CLINICAL DATA:  Left breast cancer. EXAM: NUCLEAR MEDICINE BREAST LYMPHOSCINTIGRAPHY TECHNIQUE: Intradermal injection of radiopharmaceutical was performed at the 12 o'clock, 3 o'clock, 6 o'clock, and 9 o'clock positions around the left nipple. The patient was then sent to the operating room where the sentinel node(s) were identified and removed by the surgeon. RADIOPHARMACEUTICALS:  Total of 1 mCi Millipore-filtered Technetium-13m 76mur colloid, injected in four aliquots of 0.25 mCi each.  IMPRESSION: Uncomplicated intradermal injection of a total of 1 mCi Technetium-13m s61mr colloid for purposes of sentinel node identification. Electronically Signed   By: Mark  Inez Catalina  On: 01/14/2019 09:11   DG C-Arm 1-60 Min-No Report  Result Date: 02/18/2019 Fluoroscopy was utilized by the requesting physician.  No radiographic interpretation.      ASSESSMENT & PLAN:  1. Malignant neoplasm of right breast in female, estrogen receptor positive, unspecified site of breast (HCC)  [place] Hyperglycemia   3. BRCA2 gene mutation positive in female   4. Hyponatremia   5. Encounter for antineoplastic chemotherapy   6. Diarrhea, unspecified type    Stage IB pT2 pN0 M0 ER + PR+ HER2 negative right breast cancer, grade 3, +LVI,   -BRCA2 positive Oncotype 31. Patient is status post 1 cycle of docetaxel and carboplatin. Labs reviewed and discussed with patient. Counts acceptable to proceed with cycle 2 docetaxel and carboplatin. Patient will get growth factor on day 2.     #Diarrhea, likely chemotherapy-induced.  Her symptom has completely resolved.  Discussed about potential recurrence of similar symptoms after this cycle.  She will use Imodium as instructed.  I will schedule her to get repeat blood work in 1 week and also IV fluid. #Hyper glycemia, can be chemotherapy-induced.  No history of diabetes.  I will check A1c #  Hyponatremia, mild. #Hypokalemia, resolved potassium 3.7 today.  Given the possibility of recurrence of diarrhea, put patient on potassium chloride 10 mEq daily. #BRCA gene mutation positive. She has establish care with gynecology for further evaluation. Screening for pancreatic cancer is controversial.  We will further discuss in the future once he finished chemotherapy..     All questions were answered. The patient knows to call the clinic with any problems questions or concerns.    Earlie Server, MD, PhD Hematology Oncology [facility] Pager- [phone] 04/12/2019

## 2019-04-13 ENCOUNTER — Inpatient Hospital Stay: Payer: BC Managed Care – PPO

## 2019-04-13 ENCOUNTER — Other Ambulatory Visit: Payer: Self-pay

## 2019-04-13 DIAGNOSIS — C50919 Malignant neoplasm of unspecified site of unspecified female breast: Secondary | ICD-10-CM

## 2019-04-13 DIAGNOSIS — C50511 Malignant neoplasm of lower-outer quadrant of right female breast: Secondary | ICD-10-CM

## 2019-04-13 DIAGNOSIS — Z5189 Encounter for other specified aftercare: Secondary | ICD-10-CM | POA: Diagnosis not present

## 2019-04-13 MED ORDER — PEGFILGRASTIM-JMDB 6 MG/0.6ML ~~LOC~~ SOSY
6.0000 mg | PREFILLED_SYRINGE | Freq: Once | SUBCUTANEOUS | Status: AC
  Administered 2019-04-13: 14:00:00 6 mg via SUBCUTANEOUS
  Filled 2019-04-13: qty 0.6

## 2019-04-19 ENCOUNTER — Inpatient Hospital Stay: Payer: BC Managed Care – PPO

## 2019-04-19 ENCOUNTER — Other Ambulatory Visit: Payer: Self-pay

## 2019-04-19 ENCOUNTER — Inpatient Hospital Stay: Payer: BC Managed Care – PPO | Admitting: *Deleted

## 2019-04-19 VITALS — BP 113/79 | HR 108 | Temp 98.0°F | Resp 20

## 2019-04-19 DIAGNOSIS — E871 Hypo-osmolality and hyponatremia: Secondary | ICD-10-CM

## 2019-04-19 DIAGNOSIS — C50919 Malignant neoplasm of unspecified site of unspecified female breast: Secondary | ICD-10-CM

## 2019-04-19 DIAGNOSIS — R739 Hyperglycemia, unspecified: Secondary | ICD-10-CM

## 2019-04-19 DIAGNOSIS — C50511 Malignant neoplasm of lower-outer quadrant of right female breast: Secondary | ICD-10-CM

## 2019-04-19 DIAGNOSIS — Z5189 Encounter for other specified aftercare: Secondary | ICD-10-CM | POA: Diagnosis not present

## 2019-04-19 DIAGNOSIS — Z95828 Presence of other vascular implants and grafts: Secondary | ICD-10-CM

## 2019-04-19 DIAGNOSIS — Z170 Estrogen receptor positive status [ER+]: Secondary | ICD-10-CM

## 2019-04-19 LAB — HEMOGLOBIN A1C
Hgb A1c MFr Bld: 6.9 % — ABNORMAL HIGH (ref 4.8–5.6)
Mean Plasma Glucose: 151.33 mg/dL

## 2019-04-19 LAB — CBC WITH DIFFERENTIAL/PLATELET
Abs Immature Granulocytes: 4.25 10*3/uL — ABNORMAL HIGH (ref 0.00–0.07)
Basophils Absolute: 0 10*3/uL (ref 0.0–0.1)
Basophils Relative: 0 %
Eosinophils Absolute: 0 10*3/uL (ref 0.0–0.5)
Eosinophils Relative: 0 %
HCT: 37.3 % (ref 36.0–46.0)
Hemoglobin: 12 g/dL (ref 12.0–15.0)
Immature Granulocytes: 17 %
Lymphocytes Relative: 16 %
Lymphs Abs: 4.1 10*3/uL — ABNORMAL HIGH (ref 0.7–4.0)
MCH: 29.6 pg (ref 26.0–34.0)
MCHC: 32.2 g/dL (ref 30.0–36.0)
MCV: 92.1 fL (ref 80.0–100.0)
Monocytes Absolute: 2.4 10*3/uL — ABNORMAL HIGH (ref 0.1–1.0)
Monocytes Relative: 9 %
Neutro Abs: 15 10*3/uL — ABNORMAL HIGH (ref 1.7–7.7)
Neutrophils Relative %: 58 %
Platelets: 199 10*3/uL (ref 150–400)
RBC: 4.05 MIL/uL (ref 3.87–5.11)
RDW: 16 % — ABNORMAL HIGH (ref 11.5–15.5)
WBC: 25.8 10*3/uL — ABNORMAL HIGH (ref 4.0–10.5)
nRBC: 0.3 % — ABNORMAL HIGH (ref 0.0–0.2)

## 2019-04-19 LAB — COMPREHENSIVE METABOLIC PANEL
ALT: 23 U/L (ref 0–44)
AST: 20 U/L (ref 15–41)
Albumin: 4 g/dL (ref 3.5–5.0)
Alkaline Phosphatase: 116 U/L (ref 38–126)
Anion gap: 10 (ref 5–15)
BUN: 15 mg/dL (ref 6–20)
CO2: 25 mmol/L (ref 22–32)
Calcium: 9.1 mg/dL (ref 8.9–10.3)
Chloride: 98 mmol/L (ref 98–111)
Creatinine, Ser: 0.63 mg/dL (ref 0.44–1.00)
GFR calc Af Amer: >60 mL/min (ref >60)
GFR calc non Af Amer: >60 mL/min (ref >60)
Glucose, Bld: 131 mg/dL — ABNORMAL HIGH (ref 70–99)
Potassium: 3.8 mmol/L (ref 3.5–5.1)
Sodium: 133 mmol/L — ABNORMAL LOW (ref 135–145)
Total Bilirubin: 0.3 mg/dL (ref 0.3–1.2)
Total Protein: 6.9 g/dL (ref 6.5–8.1)

## 2019-04-19 MED ORDER — HEPARIN SOD (PORK) LOCK FLUSH 100 UNIT/ML IV SOLN
500.0000 [IU] | Freq: Once | INTRAVENOUS | Status: AC | PRN
  Administered 2019-04-19: 500 [IU]
  Filled 2019-04-19: qty 5

## 2019-04-19 MED ORDER — SODIUM CHLORIDE 0.9% FLUSH
10.0000 mL | Freq: Once | INTRAVENOUS | Status: AC
  Administered 2019-04-19: 10 mL via INTRAVENOUS
  Filled 2019-04-19: qty 10

## 2019-04-19 MED ORDER — HEPARIN SOD (PORK) LOCK FLUSH 100 UNIT/ML IV SOLN
INTRAVENOUS | Status: AC
  Filled 2019-04-19: qty 5

## 2019-04-19 MED ORDER — SODIUM CHLORIDE 0.9 % IV SOLN
Freq: Once | INTRAVENOUS | Status: AC
  Filled 2019-04-19: qty 250

## 2019-04-23 ENCOUNTER — Other Ambulatory Visit: Payer: Self-pay | Admitting: Internal Medicine

## 2019-04-23 ENCOUNTER — Encounter: Payer: Self-pay | Admitting: Internal Medicine

## 2019-04-23 DIAGNOSIS — I10 Essential (primary) hypertension: Secondary | ICD-10-CM

## 2019-04-23 MED ORDER — LISINOPRIL 20 MG PO TABS: 20.0000 mg | ORAL_TABLET | Freq: Every day | ORAL | 0 refills | Status: DC

## 2019-04-23 NOTE — Telephone Encounter (Signed)
Please advise 

## 2019-05-03 ENCOUNTER — Encounter: Payer: Self-pay | Admitting: Oncology

## 2019-05-03 ENCOUNTER — Other Ambulatory Visit: Payer: Self-pay

## 2019-05-03 ENCOUNTER — Inpatient Hospital Stay: Payer: BC Managed Care – PPO

## 2019-05-03 ENCOUNTER — Inpatient Hospital Stay (HOSPITAL_BASED_OUTPATIENT_CLINIC_OR_DEPARTMENT_OTHER): Payer: BC Managed Care – PPO | Admitting: Oncology

## 2019-05-03 VITALS — BP 128/85 | HR 74 | Temp 97.0°F | Resp 18 | Wt 157.5 lb

## 2019-05-03 DIAGNOSIS — Z95828 Presence of other vascular implants and grafts: Secondary | ICD-10-CM | POA: Diagnosis not present

## 2019-05-03 DIAGNOSIS — Z5111 Encounter for antineoplastic chemotherapy: Secondary | ICD-10-CM | POA: Diagnosis not present

## 2019-05-03 DIAGNOSIS — Z1509 Genetic susceptibility to other malignant neoplasm: Secondary | ICD-10-CM

## 2019-05-03 DIAGNOSIS — C50919 Malignant neoplasm of unspecified site of unspecified female breast: Secondary | ICD-10-CM

## 2019-05-03 DIAGNOSIS — C50511 Malignant neoplasm of lower-outer quadrant of right female breast: Secondary | ICD-10-CM | POA: Diagnosis not present

## 2019-05-03 DIAGNOSIS — Z5189 Encounter for other specified aftercare: Secondary | ICD-10-CM | POA: Diagnosis not present

## 2019-05-03 DIAGNOSIS — Z170 Estrogen receptor positive status [ER+]: Secondary | ICD-10-CM

## 2019-05-03 DIAGNOSIS — Z1502 Genetic susceptibility to malignant neoplasm of ovary: Secondary | ICD-10-CM

## 2019-05-03 DIAGNOSIS — Z1501 Genetic susceptibility to malignant neoplasm of breast: Secondary | ICD-10-CM | POA: Diagnosis not present

## 2019-05-03 DIAGNOSIS — D649 Anemia, unspecified: Secondary | ICD-10-CM

## 2019-05-03 LAB — CBC WITH DIFFERENTIAL/PLATELET
Abs Immature Granulocytes: 0.03 10*3/uL (ref 0.00–0.07)
Basophils Absolute: 0 10*3/uL (ref 0.0–0.1)
Basophils Relative: 0 %
Eosinophils Absolute: 0 10*3/uL (ref 0.0–0.5)
Eosinophils Relative: 0 %
HCT: 32.1 % — ABNORMAL LOW (ref 36.0–46.0)
Hemoglobin: 10.2 g/dL — ABNORMAL LOW (ref 12.0–15.0)
Immature Granulocytes: 0 %
Lymphocytes Relative: 8 %
Lymphs Abs: 0.7 10*3/uL (ref 0.7–4.0)
MCH: 29.8 pg (ref 26.0–34.0)
MCHC: 31.8 g/dL (ref 30.0–36.0)
MCV: 93.9 fL (ref 80.0–100.0)
Monocytes Absolute: 0.1 10*3/uL (ref 0.1–1.0)
Monocytes Relative: 1 %
Neutro Abs: 7.3 10*3/uL (ref 1.7–7.7)
Neutrophils Relative %: 91 %
Platelets: 347 10*3/uL (ref 150–400)
RBC: 3.42 MIL/uL — ABNORMAL LOW (ref 3.87–5.11)
RDW: 18.1 % — ABNORMAL HIGH (ref 11.5–15.5)
WBC: 8.1 10*3/uL (ref 4.0–10.5)
nRBC: 0 % (ref 0.0–0.2)

## 2019-05-03 LAB — COMPREHENSIVE METABOLIC PANEL
ALT: 16 U/L (ref 0–44)
AST: 20 U/L (ref 15–41)
Albumin: 4.2 g/dL (ref 3.5–5.0)
Alkaline Phosphatase: 94 U/L (ref 38–126)
Anion gap: 11 (ref 5–15)
BUN: 12 mg/dL (ref 6–20)
CO2: 21 mmol/L — ABNORMAL LOW (ref 22–32)
Calcium: 9.5 mg/dL (ref 8.9–10.3)
Chloride: 103 mmol/L (ref 98–111)
Creatinine, Ser: 0.57 mg/dL (ref 0.44–1.00)
GFR calc Af Amer: >60 mL/min (ref >60)
GFR calc non Af Amer: >60 mL/min (ref >60)
Glucose, Bld: 176 mg/dL — ABNORMAL HIGH (ref 70–99)
Potassium: 3.5 mmol/L (ref 3.5–5.1)
Sodium: 135 mmol/L (ref 135–145)
Total Bilirubin: 0.5 mg/dL (ref 0.3–1.2)
Total Protein: 7.3 g/dL (ref 6.5–8.1)

## 2019-05-03 MED ORDER — LIDOCAINE-PRILOCAINE 2.5-2.5 % EX CREA: 1.0000 "application " | TOPICAL_CREAM | CUTANEOUS | 3 refills | Status: DC | PRN

## 2019-05-03 MED ORDER — PALONOSETRON HCL INJECTION 0.25 MG/5ML
0.2500 mg | Freq: Once | INTRAVENOUS | Status: AC
  Administered 2019-05-03: 0.25 mg via INTRAVENOUS
  Filled 2019-05-03: qty 5

## 2019-05-03 MED ORDER — SODIUM CHLORIDE 0.9 % IV SOLN
75.0000 mg/m2 | Freq: Once | INTRAVENOUS | Status: AC
  Administered 2019-05-03: 11:00:00 130 mg via INTRAVENOUS
  Filled 2019-05-03: qty 13

## 2019-05-03 MED ORDER — HEPARIN SOD (PORK) LOCK FLUSH 100 UNIT/ML IV SOLN
INTRAVENOUS | Status: AC
  Filled 2019-05-03: qty 5

## 2019-05-03 MED ORDER — POTASSIUM CHLORIDE ER 10 MEQ PO TBCR: 10.0000 meq | EXTENDED_RELEASE_TABLET | Freq: Every day | ORAL | 0 refills | Status: DC

## 2019-05-03 MED ORDER — SODIUM CHLORIDE 0.9% FLUSH
10.0000 mL | Freq: Once | INTRAVENOUS | Status: AC
  Administered 2019-05-03: 10 mL via INTRAVENOUS
  Filled 2019-05-03: qty 10

## 2019-05-03 MED ORDER — SODIUM CHLORIDE 0.9 % IV SOLN
Freq: Once | INTRAVENOUS | Status: AC
  Filled 2019-05-03: qty 5

## 2019-05-03 MED ORDER — SODIUM CHLORIDE 0.9 % IV SOLN
565.5000 mg | Freq: Once | INTRAVENOUS | Status: AC
  Administered 2019-05-03: 12:00:00 570 mg via INTRAVENOUS
  Filled 2019-05-03: qty 57

## 2019-05-03 MED ORDER — SODIUM CHLORIDE 0.9 % IV SOLN
Freq: Once | INTRAVENOUS | Status: AC
  Filled 2019-05-03: qty 250

## 2019-05-03 MED ORDER — HEPARIN SOD (PORK) LOCK FLUSH 100 UNIT/ML IV SOLN
500.0000 [IU] | Freq: Once | INTRAVENOUS | Status: AC | PRN
  Administered 2019-05-03: 500 [IU]
  Filled 2019-05-03: qty 5

## 2019-05-03 NOTE — Progress Notes (Signed)
Patient does not offer any problems today.  

## 2019-05-03 NOTE — Progress Notes (Signed)
Hematology/Oncology follow up  note [facility] Telephone:([phone]) [phone] Fax:(336) [phone]   Patient Care Team: Glean Hess, MD as PCP - General (Internal Medicine) Jannet Mantis, MD (Dermatology)  CHIEF COMPLAINTS/REASON FOR VISIT:  Follow-up for breast cancer.  HISTORY OF PRESENTING ILLNESS:  Cheryl Mooney is a  54 y.o.  female with PMH listed below who was referred to me for evaluation of breast cancer Patient reports feeling mass of her right breast week before her annual mammogram. 12/03/2018 patient had bilateral diagnostic mammogram and ultrasound  which showed suspicious 2.1 x 1.3 x 1.6 irregular mass at 7:00 in the right breast, 2 cm from the nipple. 2 borderline lymph nodes are seen in the right axilla.  1 of the nodes demonstrate a cortex of 4.4 mm.  Both lymph nodes demonstrated retention of fatty hila. Patient underwent ultrasound-guided biopsy of right breast mass as well as ultrasound-guided biopsy of 1 of the 2 mildly abnormal right axillary lymph nodes. 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 Right axilla lymph node negative for malignancy. ER> 90% positive, PR11-50% positive, HER-2 equivocal, 2+ by IHC.  FISH negative.   Nipple discharge: Denies Family history: Mother diagnosed with breast cancer at age of 67, and colon cancer at age of 31.  Mother has BRCA2 mutation.   Maternal grandmother breast cancer, maternal great aunt breast cancer.  Patient recalls that she was tested long time ago and was not aware of any results.  OCP use: In her 20s-30s Estrogen and progesterone therapy: denies History of radiation to chest: denies.  Previous breast surgery: Denies  BRCA 2 positive.  # 01/14/2019 patient underwent bilateral mastectomy with sentinel lymph node biopsy of left and the right axillary. Right breast showed invasive mammary carcinoma, DCIS  positive, apocrine metaplastic, stromal fibrosis, duct ectasia, simple cyst formation, usual epithelial hyperplasia.  Sentinel lymph node on the right side was negative. pT2 pN0 Left prophylactic mastectomy and a sentinel lymph node negative for malignancy.  #OncotypeDX recurrence score 31, adjuvant chemotherapy benefit more than 15%. # She follows up with plastic surgeon Dr. Marla Roe, she has expander, expanding process is being during chemotherapy. Mediport was placed by Dr. Peyton Najjar on 02/18/2019.  INTERVAL HISTORY Cheryl Mooney is a 54 y.o. female who has above history reviewed by me today presents for follow up visit for management of stage I breast cancer, ER PR positive, HER-2 negative, hereditary BRCA 2 mutation.  Problems and complaints are listed below: Patient has finished 2 cycles of docetaxel and carboplatin. No additional diarrhea episodes. She reports feeling well.  Chronic fatigue, slightly worse. No nausea, vomiting, fever or chills No numbness or tingling. .   Review of Systems  Constitutional: Positive for fatigue. Negative for appetite change, chills and fever.  HENT:   Negative for hearing loss and voice change.   Eyes: Negative for eye problems.  Respiratory: Negative for chest tightness and cough.   Cardiovascular: Negative for chest pain.  Gastrointestinal: Negative for abdominal distention, abdominal pain, blood in stool and diarrhea.  Endocrine: Negative for hot flashes.  Genitourinary: Negative for difficulty urinating and frequency.   Musculoskeletal: Negative for arthralgias.  Skin: Negative for itching and rash.  Neurological: Negative for extremity weakness.  Hematological: Negative for adenopathy.  Psychiatric/Behavioral: Negative for confusion. The patient is not nervous/anxious.     MEDICAL HISTORY:  Past Medical History:  Diagnosis Date  . Benign neoplasm of sigmoid colon   .  Benign neoplasm of transverse colon   . Breast cancer ([place])   .  Family history of BRCA2 gene positive   . Family history of breast cancer   . Family history of colon cancer   . Family history of lung cancer   . Family history of pancreatic cancer   . GERD (gastroesophageal reflux disease)   . History of kidney stones   . Hypertension   . Malignant neoplasm of lower-outer quadrant of right breast of female, estrogen receptor positive ([place]) 12/17/2018  . Smoker     SURGICAL HISTORY: Past Surgical History:  Procedure Laterality Date  . AXILLARY SENTINEL NODE BIOPSY Bilateral 01/14/2019   Procedure: AXILLARY SENTINEL NODE BIOPSY;  Surgeon: Herbert Pun, MD;  Location: [facility];  Service: General;  Laterality: Bilateral;  . BREAST BIOPSY Right 12/08/2018   [place] bx of mass with calcs path pending  . BREAST BIOPSY Right 12/08/2018   [place] bx of LN, path pending  . BREAST RECONSTRUCTION WITH PLACEMENT OF TISSUE EXPANDER AND ALLODERM Bilateral 01/14/2019   Procedure: BILATERAL BREAST RECONSTRUCTION WITH PLACEMENT OF TISSUE EXPANDER AND FLEX HD;  Surgeon: Wallace Going, DO;  Location: [facility];  Service: Plastics;  Laterality: Bilateral;  . COLONOSCOPY WITH PROPOFOL N/A 02/17/2015   Procedure: COLONOSCOPY WITH PROPOFOL;  Surgeon: Lucilla Lame, MD;  Location: [facility];  Service: Endoscopy;  Laterality: N/A;  . POLYPECTOMY  02/17/2015   Procedure: POLYPECTOMY;  Surgeon: Lucilla Lame, MD;  Location: [facility];  Service: Endoscopy;;  . PORTACATH PLACEMENT Left 02/18/2019   Procedure: INSERTION PORT-A-CATH;  Surgeon: Herbert Pun, MD;  Location: [facility];  Service: General;  Laterality: Left;  . SIMPLE MASTECTOMY WITH AXILLARY SENTINEL NODE BIOPSY Left 01/14/2019   Procedure: LEFT SIMPLE MASTECTOMY;  Surgeon: Herbert Pun, MD;  Location: [facility];  Service: General;  Laterality: Left;  . TOTAL MASTECTOMY Right 01/14/2019   Procedure: RIGHT TOTAL MASTECTOMY;  Surgeon: Herbert Pun, MD;  Location: [facility];   Service: General;  Laterality: Right;  . WISDOM TOOTH EXTRACTION      SOCIAL HISTORY: Social History   Socioeconomic History  . Marital status: Married    Spouse name: Not on file  . Number of children: Not on file  . Years of education: Not on file  . Highest education level: Not on file  Occupational History  . Not on file  Tobacco Use  . Smoking status: Current Every Day Smoker    Packs/day: 0.25    Years: 20.00    Pack years: 5.00    Types: Cigarettes  . Smokeless tobacco: Never Used  . Tobacco comment: 3 cig. per day  Substance and Sexual Activity  . Alcohol use: Yes    Alcohol/week: 2.0 standard drinks    Types: 2 Standard drinks or equivalent per week    Comment: occasional  . Drug use: No  . Sexual activity: Yes    Birth control/protection: Post-menopausal  Other Topics Concern  . Not on file  Social History Narrative  . Not on file   Social Determinants of Health   Financial Resource Strain:   . Difficulty of Paying Living Expenses: Not on file  Food Insecurity:   . Worried About Charity fundraiser in the Last Year: Not on file  . Ran Out of Food in the Last Year: Not on file  Transportation Needs:   . Lack of Transportation (Medical): Not on file  . Lack of Transportation (Non-Medical): Not on file  Physical Activity:   .  Days of Exercise per Week: Not on file  . Minutes of Exercise per Session: Not on file  Stress:   . Feeling of Stress : Not on file  Social Connections:   . Frequency of Communication with Friends and Family: Not on file  . Frequency of Social Gatherings with Friends and Family: Not on file  . Attends Religious Services: Not on file  . Active Member of Clubs or Organizations: Not on file  . Attends Archivist Meetings: Not on file  . Marital Status: Not on file  Intimate Partner Violence:   . Fear of Current or Ex-Partner: Not on file  . Emotionally Abused: Not on file  . Physically Abused: Not on file  . Sexually  Abused: Not on file  She is a retired Pharmacist, hospital  FAMILY HISTORY: Family History  Problem Relation Age of Onset  . Colon cancer Mother 61  . Breast cancer Mother 57       "BRCA2+"  . Hypertension Father   . Stroke Father   . Alzheimer's disease Father   . Prostate cancer Father   . Breast cancer Maternal Aunt        mat great aunt  . Breast cancer Maternal Grandmother 20  . Pancreatic cancer Maternal Grandfather 60    ALLERGIES:  has No Known Allergies.  MEDICATIONS:  Current Outpatient Medications  Medication Sig Dispense Refill  . acetaminophen (TYLENOL) 500 MG tablet Take 500 mg by mouth every 6 (six) hours as needed for mild pain.     Marland Kitchen dexamethasone (DECADRON) 4 MG tablet Take 2 tablets (8 mg total) by mouth 2 (two) times daily. Take decadron  Twice daily the day before taxotere then daily x 3 days after carboplatin  chemo 30 tablet 1  . DULoxetine (CYMBALTA) 60 MG capsule Take 1 capsule (60 mg total) by mouth daily. 90 capsule 3  . lisinopril (ZESTRIL) 20 MG tablet Take 1 tablet (20 mg total) by mouth daily. 90 tablet 0  . loratadine (CLARITIN) 10 MG tablet Take 10 mg by mouth daily.    Marland Kitchen omeprazole (PRILOSEC) 40 MG capsule Take 1 capsule (40 mg total) by mouth daily. 90 capsule 1  . ondansetron (ZOFRAN) 8 MG tablet Take 1 tablet (8 mg total) by mouth 2 (two) times daily as needed for refractory nausea / vomiting. Start on day 3 after chemo. 30 tablet 1  . potassium chloride (KLOR-CON) 10 MEQ tablet Take 1 tablet (10 mEq total) by mouth daily. 21 tablet 0  . prochlorperazine (COMPAZINE) 10 MG tablet Take 1 tablet (10 mg total) by mouth every 6 (six) hours as needed (Nausea or vomiting). 30 tablet 1  . bisacodyl (DULCOLAX) 5 MG EC tablet Take 5 mg by mouth daily as needed for mild constipation or moderate constipation.    . diazepam (VALIUM) 2 MG tablet Take 1 tablet (2 mg total) by mouth every 12 (twelve) hours as needed for muscle spasms. (Patient not taking: Reported on  03/11/2019) 20 tablet 0  . HYDROcodone-acetaminophen (NORCO/VICODIN) 5-325 MG tablet Take 1 tablet by mouth every 6 (six) hours as needed for moderate pain.    Marland Kitchen lidocaine-prilocaine (EMLA) cream Apply 1 application topically as needed. Place a pea size amount to port 1-2 hours before chemotherapy. 30 g 3   No current facility-administered medications for this visit.     PHYSICAL EXAMINATION: ECOG PERFORMANCE STATUS: 1 - Symptomatic but completely ambulatory Vitals:   05/03/19 0839  BP: 128/85  Pulse: 74  Resp: 18  Temp: (!) 97 F (36.1 C)   Filed Weights   05/03/19 0839  Weight: 157 lb 8 oz (71.4 kg)    Physical Exam Constitutional:      General: She is not in acute distress. HENT:     Head: Normocephalic and atraumatic.  Eyes:     General: No scleral icterus.    Pupils: Pupils are equal, round, and reactive to light.  Cardiovascular:     Rate and Rhythm: Normal rate and regular rhythm.     Heart sounds: Normal heart sounds.  Pulmonary:     Effort: Pulmonary effort is normal. No respiratory distress.     Breath sounds: No wheezing.  Abdominal:     General: Bowel sounds are normal. There is no distension.     Palpations: Abdomen is soft. There is no mass.     Tenderness: There is no abdominal tenderness.  Musculoskeletal:        General: No deformity. Normal range of motion.     Cervical back: Normal range of motion and neck supple.  Skin:    General: Skin is warm and dry.     Findings: No erythema or rash.  Neurological:     Mental Status: She is alert and oriented to person, place, and time.     Cranial Nerves: No cranial nerve deficit.     Coordination: Coordination normal.  Psychiatric:        Behavior: Behavior normal.        Thought Content: Thought content normal.    LABORATORY DATA:  I have reviewed the data as listed Lab Results  Component Value Date   WBC 8.1 05/03/2019   HGB 10.2 (L) 05/03/2019   HCT 32.1 (L) 05/03/2019   MCV 93.9 05/03/2019    PLT 347 05/03/2019   Recent Labs    04/12/19 0814 04/19/19 1129 05/03/19 0818  NA 133* 133* 135  K 3.7 3.8 3.5  CL 101 98 103  CO2 20* 25 21*  GLUCOSE 280* 131* 176*  BUN 11 15 12   CREATININE 0.69 0.63 0.57  CALCIUM 9.3 9.1 9.5  GFRNONAA >60 >60 >60  GFRAA >60 >60 >60  PROT 7.4 6.9 7.3  ALBUMIN 4.0 4.0 4.2  AST 24 20 20   ALT 13 23 16   ALKPHOS 96 116 94  BILITOT 0.4 0.3 0.5   Iron/TIBC/Ferritin/ %Sat No results found for: IRON, TIBC, FERRITIN, IRONPCTSAT   RADIOGRAPHIC STUDIES: I have personally reviewed the radiological images as listed and agreed with the findings in the report. X-ray chest PA or AP  Result Date: 02/18/2019 CLINICAL DATA:  54 year old female with Port-A-Cath placement. EXAM: CHEST  1 VIEW COMPARISON:  None. FINDINGS: Left-sided Port-A-Cath with tip at the cavoatrial junction. No pneumothorax. Mild diffuse interstitial prominence. No focal consolidation, or pleural effusion. The cardiac silhouette is within normal limits. No acute osseous pathology. Bilateral breast implants or tissue expanders noted. IMPRESSION: Port-A-Cath with tip at the cavoatrial junction. No pneumothorax. Electronically Signed   By: Anner Crete M.D.   On: 02/18/2019 13:19   NM Cardiac [facility] [facility]  Result Date: 02/11/2019 CLINICAL DATA:  Breast cancer. Evaluate cardiac function in relation to chemotherapy. EXAM: NUCLEAR MEDICINE CARDIAC BLOOD POOL IMAGING (MUGA) TECHNIQUE: Cardiac multi-gated acquisition was performed at rest following intravenous injection of Tc-66mlabeled red blood cells. RADIOPHARMACEUTICALS:  21.8 mCi Tc-963mertechnetate in-vitro labeled red blood cells IV COMPARISON:  None. FINDINGS: No  focal wall motion abnormality of the left ventricle. Calculated left  ventricular ejection fraction equals 59.3% IMPRESSION: Left ventricular ejection fraction equals59 %. Electronically Signed   By: Suzy Bouchard M.D.   On: 02/11/2019 16:10   DG C-Arm 1-60 Min-No  Report  Result Date: 02/18/2019 Fluoroscopy was utilized by the requesting physician.  No radiographic interpretation.      ASSESSMENT & PLAN:  1. Malignant neoplasm of lower-outer quadrant of right breast of female, estrogen receptor positive ([place])   2. Port-A-Cath in place   3. Encounter for antineoplastic chemotherapy   4. BRCA2 gene mutation positive in female   5. Anemia, unspecified type    Stage IB pT2 pN0 M0 ER + PR+ HER2 negative right breast cancer, grade 3, +LVI,   -BRCA2 positive Oncotype 31. Patient is status post 1 cycle of AC, chemotherapy plan was switched to docetaxel and carboplatin after consulting expert opinion.   She has finished 2 cycle of docetaxel and carboplatin. Overall she tolerates well. Labs reviewed and discussed with patient.  Counts are stable to proceed with cycle 3 docetaxel and carboplatin.  Patient will receive growth factor support. I had a discussion with the patient that given that 1 cycle of AC and 3 cycles of TC, should be sufficient as her adjuvant chemotherapy.  Patient prefers to finish the fourth cycle of TC. #Anemia, likely secondary to chemotherapy.  Monitor.  Stable. #Hyponatremia has improved.  Likely secondary to discontinuation of HCTZ.  Sodium level 135. #Hypoglycemia, glucose level 176. #BRCA 2 gene mutation positive, patient has establish care with gynecology for further evaluation. Screening for pancreatic cancer is controversial.  We will further discuss in the future once he finished chemotherapy..     All questions were answered. The patient knows to call the clinic with any problems questions or concerns.    Earlie Server, MD, PhD Hematology Oncology [facility] at [facility] Pager- [phone] 05/03/2019

## 2019-05-04 ENCOUNTER — Other Ambulatory Visit: Payer: Self-pay

## 2019-05-04 ENCOUNTER — Inpatient Hospital Stay: Payer: BC Managed Care – PPO

## 2019-05-04 DIAGNOSIS — C50919 Malignant neoplasm of unspecified site of unspecified female breast: Secondary | ICD-10-CM

## 2019-05-04 DIAGNOSIS — C50511 Malignant neoplasm of lower-outer quadrant of right female breast: Secondary | ICD-10-CM

## 2019-05-04 DIAGNOSIS — Z5189 Encounter for other specified aftercare: Secondary | ICD-10-CM | POA: Diagnosis not present

## 2019-05-04 MED ORDER — PEGFILGRASTIM-JMDB 6 MG/0.6ML ~~LOC~~ SOSY
6.0000 mg | PREFILLED_SYRINGE | Freq: Once | SUBCUTANEOUS | Status: AC
  Administered 2019-05-04: 6 mg via SUBCUTANEOUS
  Filled 2019-05-04: qty 0.6

## 2019-05-24 ENCOUNTER — Other Ambulatory Visit: Payer: Self-pay

## 2019-05-24 ENCOUNTER — Inpatient Hospital Stay: Payer: BC Managed Care – PPO

## 2019-05-24 ENCOUNTER — Inpatient Hospital Stay: Payer: BC Managed Care – PPO | Attending: Oncology

## 2019-05-24 ENCOUNTER — Inpatient Hospital Stay (HOSPITAL_BASED_OUTPATIENT_CLINIC_OR_DEPARTMENT_OTHER): Payer: BC Managed Care – PPO | Admitting: Oncology

## 2019-05-24 ENCOUNTER — Encounter: Payer: Self-pay | Admitting: Oncology

## 2019-05-24 VITALS — BP 138/86 | HR 85 | Temp 97.2°F | Resp 18 | Wt 163.3 lb

## 2019-05-24 DIAGNOSIS — Z1501 Genetic susceptibility to malignant neoplasm of breast: Secondary | ICD-10-CM | POA: Diagnosis not present

## 2019-05-24 DIAGNOSIS — Z5111 Encounter for antineoplastic chemotherapy: Secondary | ICD-10-CM | POA: Diagnosis present

## 2019-05-24 DIAGNOSIS — Z801 Family history of malignant neoplasm of trachea, bronchus and lung: Secondary | ICD-10-CM | POA: Diagnosis not present

## 2019-05-24 DIAGNOSIS — F17210 Nicotine dependence, cigarettes, uncomplicated: Secondary | ICD-10-CM | POA: Diagnosis not present

## 2019-05-24 DIAGNOSIS — E871 Hypo-osmolality and hyponatremia: Secondary | ICD-10-CM

## 2019-05-24 DIAGNOSIS — Z170 Estrogen receptor positive status [ER+]: Secondary | ICD-10-CM | POA: Insufficient documentation

## 2019-05-24 DIAGNOSIS — Z7952 Long term (current) use of systemic steroids: Secondary | ICD-10-CM | POA: Insufficient documentation

## 2019-05-24 DIAGNOSIS — Z8249 Family history of ischemic heart disease and other diseases of the circulatory system: Secondary | ICD-10-CM | POA: Diagnosis not present

## 2019-05-24 DIAGNOSIS — Z79899 Other long term (current) drug therapy: Secondary | ICD-10-CM | POA: Insufficient documentation

## 2019-05-24 DIAGNOSIS — R5383 Other fatigue: Secondary | ICD-10-CM | POA: Diagnosis not present

## 2019-05-24 DIAGNOSIS — C50511 Malignant neoplasm of lower-outer quadrant of right female breast: Secondary | ICD-10-CM

## 2019-05-24 DIAGNOSIS — C50919 Malignant neoplasm of unspecified site of unspecified female breast: Secondary | ICD-10-CM

## 2019-05-24 DIAGNOSIS — Z800 Family history of malignant neoplasm of digestive organs: Secondary | ICD-10-CM | POA: Diagnosis not present

## 2019-05-24 DIAGNOSIS — D649 Anemia, unspecified: Secondary | ICD-10-CM | POA: Diagnosis not present

## 2019-05-24 DIAGNOSIS — Z803 Family history of malignant neoplasm of breast: Secondary | ICD-10-CM | POA: Insufficient documentation

## 2019-05-24 DIAGNOSIS — Z5189 Encounter for other specified aftercare: Secondary | ICD-10-CM | POA: Diagnosis not present

## 2019-05-24 DIAGNOSIS — K219 Gastro-esophageal reflux disease without esophagitis: Secondary | ICD-10-CM | POA: Diagnosis not present

## 2019-05-24 DIAGNOSIS — Z1502 Genetic susceptibility to malignant neoplasm of ovary: Secondary | ICD-10-CM

## 2019-05-24 DIAGNOSIS — Z1509 Genetic susceptibility to other malignant neoplasm: Secondary | ICD-10-CM

## 2019-05-24 DIAGNOSIS — Z95828 Presence of other vascular implants and grafts: Secondary | ICD-10-CM

## 2019-05-24 LAB — CBC WITH DIFFERENTIAL/PLATELET
Abs Immature Granulocytes: 0.06 10*3/uL (ref 0.00–0.07)
Basophils Absolute: 0 10*3/uL (ref 0.0–0.1)
Basophils Relative: 0 %
Eosinophils Absolute: 0 10*3/uL (ref 0.0–0.5)
Eosinophils Relative: 0 %
HCT: 31.8 % — ABNORMAL LOW (ref 36.0–46.0)
Hemoglobin: 10.1 g/dL — ABNORMAL LOW (ref 12.0–15.0)
Immature Granulocytes: 1 %
Lymphocytes Relative: 12 %
Lymphs Abs: 1 10*3/uL (ref 0.7–4.0)
MCH: 30.6 pg (ref 26.0–34.0)
MCHC: 31.8 g/dL (ref 30.0–36.0)
MCV: 96.4 fL (ref 80.0–100.0)
Monocytes Absolute: 0.1 10*3/uL (ref 0.1–1.0)
Monocytes Relative: 1 %
Neutro Abs: 7 10*3/uL (ref 1.7–7.7)
Neutrophils Relative %: 86 %
Platelets: 323 10*3/uL (ref 150–400)
RBC: 3.3 MIL/uL — ABNORMAL LOW (ref 3.87–5.11)
RDW: 19.6 % — ABNORMAL HIGH (ref 11.5–15.5)
WBC: 8.1 10*3/uL (ref 4.0–10.5)
nRBC: 0 % (ref 0.0–0.2)

## 2019-05-24 LAB — COMPREHENSIVE METABOLIC PANEL
ALT: 18 U/L (ref 0–44)
AST: 23 U/L (ref 15–41)
Albumin: 4 g/dL (ref 3.5–5.0)
Alkaline Phosphatase: 95 U/L (ref 38–126)
Anion gap: 12 (ref 5–15)
BUN: 12 mg/dL (ref 6–20)
CO2: 20 mmol/L — ABNORMAL LOW (ref 22–32)
Calcium: 9.2 mg/dL (ref 8.9–10.3)
Chloride: 101 mmol/L (ref 98–111)
Creatinine, Ser: 0.58 mg/dL (ref 0.44–1.00)
GFR calc Af Amer: >60 mL/min (ref >60)
GFR calc non Af Amer: >60 mL/min (ref >60)
Glucose, Bld: 231 mg/dL — ABNORMAL HIGH (ref 70–99)
Potassium: 3.9 mmol/L (ref 3.5–5.1)
Sodium: 133 mmol/L — ABNORMAL LOW (ref 135–145)
Total Bilirubin: 0.4 mg/dL (ref 0.3–1.2)
Total Protein: 7.1 g/dL (ref 6.5–8.1)

## 2019-05-24 MED ORDER — HEPARIN SOD (PORK) LOCK FLUSH 100 UNIT/ML IV SOLN
500.0000 [IU] | Freq: Once | INTRAVENOUS | Status: AC | PRN
  Administered 2019-05-24: 500 [IU]
  Filled 2019-05-24: qty 5

## 2019-05-24 MED ORDER — SODIUM CHLORIDE 0.9% FLUSH
10.0000 mL | Freq: Once | INTRAVENOUS | Status: AC
  Administered 2019-05-24: 10 mL via INTRAVENOUS
  Filled 2019-05-24: qty 10

## 2019-05-24 MED ORDER — SODIUM CHLORIDE 0.9 % IV SOLN
75.0000 mg/m2 | Freq: Once | INTRAVENOUS | Status: AC
  Administered 2019-05-24: 130 mg via INTRAVENOUS
  Filled 2019-05-24: qty 13

## 2019-05-24 MED ORDER — SODIUM CHLORIDE 0.9 % IV SOLN
Freq: Once | INTRAVENOUS | Status: AC
  Filled 2019-05-24: qty 250

## 2019-05-24 MED ORDER — PALONOSETRON HCL INJECTION 0.25 MG/5ML
0.2500 mg | Freq: Once | INTRAVENOUS | Status: AC
  Administered 2019-05-24: 0.25 mg via INTRAVENOUS
  Filled 2019-05-24: qty 5

## 2019-05-24 MED ORDER — HEPARIN SOD (PORK) LOCK FLUSH 100 UNIT/ML IV SOLN
INTRAVENOUS | Status: AC
  Filled 2019-05-24: qty 5

## 2019-05-24 MED ORDER — SODIUM CHLORIDE 0.9 % IV SOLN
565.5000 mg | Freq: Once | INTRAVENOUS | Status: AC
  Administered 2019-05-24: 570 mg via INTRAVENOUS
  Filled 2019-05-24: qty 57

## 2019-05-24 MED ORDER — ANASTROZOLE 1 MG PO TABS: 1.0000 mg | ORAL_TABLET | Freq: Every day | ORAL | 1 refills | Status: DC

## 2019-05-24 MED ORDER — SODIUM CHLORIDE 0.9 % IV SOLN
Freq: Once | INTRAVENOUS | Status: AC
  Filled 2019-05-24: qty 5

## 2019-05-24 NOTE — Progress Notes (Signed)
Patient here for follow. No new concerns or problems voiced.

## 2019-05-24 NOTE — Progress Notes (Signed)
Hematology/Oncology follow up  note [facility] [phone] [phone]   Patient Care Team: Berglund, Laura H, MD as PCP - General (Internal Medicine) Benitez-Graham, Ana M, MD (Dermatology)  CHIEF COMPLAINTS/REASON FOR VISIT:  Follow-up for breast cancer.  HISTORY OF PRESENTING ILLNESS:  Cheryl Mooney is a  53 y.o.  female with PMH listed below who was referred to me for evaluation of breast cancer Patient reports feeling mass of her right breast week before her annual mammogram. 12/03/2018 patient had bilateral diagnostic mammogram and ultrasound  which showed suspicious 2.1 x 1.3 x 1.6 irregular mass at 7:00 in the right breast, 2 cm from the nipple. 2 borderline lymph nodes are seen in the right axilla.  1 of the nodes demonstrate a cortex of 4.4 mm.  Both lymph nodes demonstrated retention of fatty hila. Patient underwent ultrasound-guided biopsy of right breast mass as well as ultrasound-guided biopsy of 1 of the 2 mildly abnormal right axillary lymph nodes. 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 Right axilla lymph node negative for malignancy. ER> 90% positive, PR11-50% positive, HER-2 equivocal, 2+ by IHC.  FISH negative.   Nipple discharge: Denies Family history: Mother diagnosed with breast cancer at age of 53, and colon cancer at age of 68.  Mother has BRCA2 mutation.   Maternal grandmother breast cancer, maternal great aunt breast cancer.  Patient recalls that she was tested long time ago and was not aware of any results.  OCP use: In her 20s-30s Estrogen and progesterone therapy: denies History of radiation to chest: denies.  Previous breast surgery: Denies  BRCA 2 positive.  # 01/14/2019 patient underwent bilateral mastectomy with sentinel lymph node biopsy of left and the right axillary. Right breast showed invasive mammary carcinoma, DCIS  positive, apocrine metaplastic, stromal fibrosis, duct ectasia, simple cyst formation, usual epithelial hyperplasia.  Sentinel lymph node on the right side was negative. pT2 pN0 Left prophylactic mastectomy and a sentinel lymph node negative for malignancy.  #OncotypeDX recurrence score 31, adjuvant chemotherapy benefit more than 15%. # She follows up with plastic surgeon Dr. Dillingham, she has expander, expanding process is being during chemotherapy. Mediport was placed by Dr. Cintron on 02/18/2019.  INTERVAL HISTORY Cheryl Mooney is a 53 y.o. female who has above history reviewed by me today presents for follow up visit for management of stage I breast cancer, ER PR positive, HER-2 negative, hereditary BRCA 2 mutation.  Problems and complaints are listed below: She reports feeling well and tolerates previous chemotherapy's. Denies any diarrhea episodes.  Denies any fever, chills, nausea vomiting. Denies any numbness or tingling ..   Review of Systems  Constitutional: Positive for fatigue. Negative for appetite change, chills and fever.  HENT:   Negative for hearing loss and voice change.   Eyes: Negative for eye problems.  Respiratory: Negative for chest tightness and cough.   Cardiovascular: Negative for chest pain.  Gastrointestinal: Negative for abdominal distention, abdominal pain, blood in stool and diarrhea.  Endocrine: Negative for hot flashes.  Genitourinary: Negative for difficulty urinating and frequency.   Musculoskeletal: Negative for arthralgias.  Skin: Negative for itching and rash.  Neurological: Negative for extremity weakness.  Hematological: Negative for adenopathy.  Psychiatric/Behavioral: Negative for confusion. The patient is not nervous/anxious.     MEDICAL HISTORY:  Past Medical History:  Diagnosis Date  . Benign neoplasm of sigmoid colon   . Benign neoplasm of transverse colon   .   Breast cancer (HCC)   . Family history of BRCA2 gene positive   .  Family history of breast cancer   . Family history of colon cancer   . Family history of lung cancer   . Family history of pancreatic cancer   . GERD (gastroesophageal reflux disease)   . History of kidney stones   . Hypertension   . Malignant neoplasm of lower-outer quadrant of right breast of female, estrogen receptor positive (HCC) 12/17/2018  . Smoker     SURGICAL HISTORY: Past Surgical History:  Procedure Laterality Date  . AXILLARY SENTINEL NODE BIOPSY Bilateral 01/14/2019   Procedure: AXILLARY SENTINEL NODE BIOPSY;  Surgeon: Cintron-Diaz, Edgardo, MD;  Location: [facility];  Service: General;  Laterality: Bilateral;  . BREAST BIOPSY Right 12/08/2018   us bx of mass with calcs path pending  . BREAST BIOPSY Right 12/08/2018   us bx of LN, path pending  . BREAST RECONSTRUCTION WITH PLACEMENT OF TISSUE EXPANDER AND ALLODERM Bilateral 01/14/2019   Procedure: BILATERAL BREAST RECONSTRUCTION WITH PLACEMENT OF TISSUE EXPANDER AND FLEX HD;  Surgeon: Dillingham, Claire S, DO;  Location: [facility];  Service: Plastics;  Laterality: Bilateral;  . COLONOSCOPY WITH PROPOFOL N/A 02/17/2015   Procedure: COLONOSCOPY WITH PROPOFOL;  Surgeon: Darren Wohl, MD;  Location: [facility];  Service: Endoscopy;  Laterality: N/A;  . POLYPECTOMY  02/17/2015   Procedure: POLYPECTOMY;  Surgeon: Darren Wohl, MD;  Location: [facility];  Service: Endoscopy;;  . PORTACATH PLACEMENT Left 02/18/2019   Procedure: INSERTION PORT-A-CATH;  Surgeon: Cintron-Diaz, Edgardo, MD;  Location: [facility];  Service: General;  Laterality: Left;  . SIMPLE MASTECTOMY WITH AXILLARY SENTINEL NODE BIOPSY Left 01/14/2019   Procedure: LEFT SIMPLE MASTECTOMY;  Surgeon: Cintron-Diaz, Edgardo, MD;  Location: [facility];  Service: General;  Laterality: Left;  . TOTAL MASTECTOMY Right 01/14/2019   Procedure: RIGHT TOTAL MASTECTOMY;  Surgeon: Cintron-Diaz, Edgardo, MD;  Location: [facility];  Service: General;  Laterality: Right;  .  WISDOM TOOTH EXTRACTION      SOCIAL HISTORY: Social History   Socioeconomic History  . Marital status: Married    Spouse name: Not on file  . Number of children: Not on file  . Years of education: Not on file  . Highest education level: Not on file  Occupational History  . Not on file  Tobacco Use  . Smoking status: Current Every Day Smoker    Packs/day: 0.25    Years: 20.00    Pack years: 5.00    Types: Cigarettes  . Smokeless tobacco: Never Used  . Tobacco comment: 3 cig. per day  Substance and Sexual Activity  . Alcohol use: Yes    Alcohol/week: 2.0 standard drinks    Types: 2 Standard drinks or equivalent per week    Comment: occasional  . Drug use: No  . Sexual activity: Yes    Birth control/protection: Post-menopausal  Other Topics Concern  . Not on file  Social History Narrative  . Not on file   Social Determinants of Health   Financial Resource Strain:   . Difficulty of Paying Living Expenses: Not on file  Food Insecurity:   . Worried About Running Out of Food in the Last Year: Not on file  . Ran Out of Food in the Last Year: Not on file  Transportation Needs:   . Lack of Transportation (Medical): Not on file  . Lack of Transportation (Non-Medical): Not on file  Physical Activity:   . Days of Exercise per Week:   Not on file  . Minutes of Exercise per Session: Not on file  Stress:   . Feeling of Stress : Not on file  Social Connections:   . Frequency of Communication with Friends and Family: Not on file  . Frequency of Social Gatherings with Friends and Family: Not on file  . Attends Religious Services: Not on file  . Active Member of Clubs or Organizations: Not on file  . Attends Club or Organization Meetings: Not on file  . Marital Status: Not on file  Intimate Partner Violence:   . Fear of Current or Ex-Partner: Not on file  . Emotionally Abused: Not on file  . Physically Abused: Not on file  . Sexually Abused: Not on file  She is a retired  teacher  FAMILY HISTORY: Family History  Problem Relation Age of Onset  . Colon cancer Mother 68  . Breast cancer Mother 53       "BRCA2+"  . Hypertension Father   . Stroke Father   . Alzheimer's disease Father   . Prostate cancer Father   . Breast cancer Maternal Aunt        mat great aunt  . Breast cancer Maternal Grandmother 58  . Pancreatic cancer Maternal Grandfather 60    ALLERGIES:  has No Known Allergies.  MEDICATIONS:  Current Outpatient Medications  Medication Sig Dispense Refill  . acetaminophen (TYLENOL) 500 MG tablet Take 500 mg by mouth every 6 (six) hours as needed for mild pain.     . bisacodyl (DULCOLAX) 5 MG EC tablet Take 5 mg by mouth daily as needed for mild constipation or moderate constipation.    . dexamethasone (DECADRON) 4 MG tablet Take 2 tablets (8 mg total) by mouth 2 (two) times daily. Take decadron  Twice daily the day before taxotere then daily x 3 days after carboplatin  chemo 30 tablet 1  . DULoxetine (CYMBALTA) 60 MG capsule Take 1 capsule (60 mg total) by mouth daily. 90 capsule 3  . lidocaine-prilocaine (EMLA) cream Apply 1 application topically as needed. Place a pea size amount to port 1-2 hours before chemotherapy. 30 g 3  . lisinopril (ZESTRIL) 20 MG tablet Take 1 tablet (20 mg total) by mouth daily. 90 tablet 0  . loratadine (CLARITIN) 10 MG tablet Take 10 mg by mouth daily.    . omeprazole (PRILOSEC) 40 MG capsule Take 1 capsule (40 mg total) by mouth daily. 90 capsule 1  . ondansetron (ZOFRAN) 8 MG tablet Take 1 tablet (8 mg total) by mouth 2 (two) times daily as needed for refractory nausea / vomiting. Start on day 3 after chemo. 30 tablet 1  . potassium chloride (KLOR-CON) 10 MEQ tablet Take 1 tablet (10 mEq total) by mouth daily. 21 tablet 0  . prochlorperazine (COMPAZINE) 10 MG tablet Take 1 tablet (10 mg total) by mouth every 6 (six) hours as needed (Nausea or vomiting). 30 tablet 1  . anastrozole (ARIMIDEX) 1 MG tablet Take 1 tablet  (1 mg total) by mouth daily. 90 tablet 1  . diazepam (VALIUM) 2 MG tablet Take 1 tablet (2 mg total) by mouth every 12 (twelve) hours as needed for muscle spasms. (Patient not taking: Reported on 03/11/2019) 20 tablet 0  . HYDROcodone-acetaminophen (NORCO/VICODIN) 5-325 MG tablet Take 1 tablet by mouth every 6 (six) hours as needed for moderate pain.     No current facility-administered medications for this visit.     PHYSICAL EXAMINATION: ECOG PERFORMANCE STATUS: 1 - Symptomatic but   completely ambulatory Vitals:   05/24/19 0843  BP: 138/86  Pulse: 85  Resp: 18  Temp: (!) 97.2 F (36.2 C)   Filed Weights   05/24/19 0843  Weight: 163 lb 4.8 oz (74.1 kg)    Physical Exam Constitutional:      General: She is not in acute distress. HENT:     Head: Normocephalic and atraumatic.  Eyes:     General: No scleral icterus.    Pupils: Pupils are equal, round, and reactive to light.  Cardiovascular:     Rate and Rhythm: Normal rate and regular rhythm.     Heart sounds: Normal heart sounds.  Pulmonary:     Effort: Pulmonary effort is normal. No respiratory distress.     Breath sounds: No wheezing.  Abdominal:     General: Bowel sounds are normal. There is no distension.     Palpations: Abdomen is soft. There is no mass.     Tenderness: There is no abdominal tenderness.  Musculoskeletal:        General: No deformity. Normal range of motion.     Cervical back: Normal range of motion and neck supple.  Skin:    General: Skin is warm and dry.     Findings: No erythema or rash.  Neurological:     Mental Status: She is alert and oriented to person, place, and time. Mental status is at baseline.     Cranial Nerves: No cranial nerve deficit.     Coordination: Coordination normal.  Psychiatric:        Mood and Affect: Mood normal.        Behavior: Behavior normal.        Thought Content: Thought content normal.    LABORATORY DATA:  I have reviewed the data as listed Lab Results    Component Value Date   WBC 8.1 05/24/2019   HGB 10.1 (L) 05/24/2019   HCT 31.8 (L) 05/24/2019   MCV 96.4 05/24/2019   PLT 323 05/24/2019   Recent Labs    04/19/19 1129 05/03/19 0818 05/24/19 0806  NA 133* 135 133*  K 3.8 3.5 3.9  CL 98 103 101  CO2 25 21* 20*  GLUCOSE 131* 176* 231*  BUN _0 CREATININE 0.63 0.57 0.58  CALCIUM 9.1 9.5 9.2  GFRNONAA >60 >60 >60  GFRAA >60 >60 >60  PROT 6.9 7.3 7.1  ALBUMIN 4.0 4.2 4.0  AST _1 ALT _2 ALKPHOS 116 94 95  BILITOT 0.3 0.5 0.4   Iron/TIBC/Ferritin/ %Sat No results found for: IRON, TIBC, FERRITIN, IRONPCTSAT   RADIOGRAPHIC STUDIES: I have personally reviewed the radiological images as listed and agreed with the findings in the report. No results found.    ASSESSMENT & PLAN:  1. Malignant neoplasm of lower-outer quadrant of right breast of female, estrogen receptor positive ([place])   2. Encounter for antineoplastic chemotherapy   3. BRCA2 gene mutation positive in female   4. Anemia, unspecified type   5. Hyponatremia   6. Port-A-Cath in place    Stage IB pT2 pN0 M0 ER + PR+ HER2 negative right breast cancer, grade 3, +LVI,   -BRCA2 positive Oncotype 31. Patient is status post 1 cycle of AC, chemotherapy plan was switched to docetaxel and carboplatin after consulting expert opinion.   She has finished 3 cycle of docetaxel and carboplatin. Labs reviewed and discussed with patient.  Patient prefers to finish total 4 cycles of TC.  She  tolerates well. Proceed with cycle 4 TC with growth factor support. She is postmenopausal  Discussed with her about the rationale and side effects of aromatase inhibitor.  Rationale of using aromatase inhibitor -Arimidex  discussed with patient.  Side effects of Arimidex including but not limited to hot flush, joint pain, fatigue, mood swing, osteoporosis discussed with patient. Patient voices understanding and willing to proceed.  I will send her a prescription of  Arimidex 1 mg daily to start 3 weeks from now. We discussed briefly about bone density.  Also adjuvant bisphosphonate.  Recommend patient to obtain dental clearance. I will discuss further with her in 6 weeks.  #Anemia, likely secondary to chemotherapy.  Stable.  Continue to monitor. #Hyponatremia has improved.  Likely secondary to discontinuation of HCTZ.  Slightly worse sodium level 131.  Continue to monitor #BRCA 2 gene mutation positive, patient has establish care with gynecology for further evaluation.  She needs to do ultrasound pelvis.Screening for pancreatic cancer is controversial.  We will further discuss in the future once he finished chemotherapy..   #Port-A-Cath in place, recommend port flush every 6 to 8 weeks.  Recommend patient to keep port for 1 to 2 years.  All questions were answered. The patient knows to call the clinic with any problems questions or concerns. Follow-up in 6 weeks   Zhou Yu, MD, PhD Hematology Oncology [facility] Pager- [phone] 05/24/2019 

## 2019-05-25 ENCOUNTER — Other Ambulatory Visit: Payer: Self-pay

## 2019-05-25 ENCOUNTER — Inpatient Hospital Stay: Payer: BC Managed Care – PPO

## 2019-05-25 DIAGNOSIS — C50511 Malignant neoplasm of lower-outer quadrant of right female breast: Secondary | ICD-10-CM

## 2019-05-25 DIAGNOSIS — Z5189 Encounter for other specified aftercare: Secondary | ICD-10-CM | POA: Diagnosis not present

## 2019-05-25 DIAGNOSIS — C50919 Malignant neoplasm of unspecified site of unspecified female breast: Secondary | ICD-10-CM

## 2019-05-25 MED ORDER — PEGFILGRASTIM-JMDB 6 MG/0.6ML ~~LOC~~ SOSY
6.0000 mg | PREFILLED_SYRINGE | Freq: Once | SUBCUTANEOUS | Status: AC
  Administered 2019-05-25: 6 mg via SUBCUTANEOUS
  Filled 2019-05-25: qty 0.6

## 2019-06-01 ENCOUNTER — Ambulatory Visit: Payer: BC Managed Care – PPO | Admitting: Internal Medicine

## 2019-06-01 ENCOUNTER — Encounter: Payer: Self-pay | Admitting: Internal Medicine

## 2019-06-01 ENCOUNTER — Other Ambulatory Visit: Payer: Self-pay

## 2019-06-01 VITALS — BP 130/78 | HR 104 | Temp 98.4°F | Ht 65.0 in | Wt 158.0 lb

## 2019-06-01 DIAGNOSIS — R7309 Other abnormal glucose: Secondary | ICD-10-CM | POA: Diagnosis not present

## 2019-06-01 DIAGNOSIS — I10 Essential (primary) hypertension: Secondary | ICD-10-CM | POA: Diagnosis not present

## 2019-06-01 DIAGNOSIS — D126 Benign neoplasm of colon, unspecified: Secondary | ICD-10-CM | POA: Diagnosis not present

## 2019-06-01 NOTE — Progress Notes (Signed)
Date:  06/01/2019   Name:  Cheryl Mooney   DOB:  1966/03/01   MRN:  [id number]   Chief Complaint: Hypertension (6 month follow up.), Elevated A1C (A1C check.), and Colon Cancer Screening (Needs new colonoscopy in Nov of this year. )  Hypertension This is a chronic problem. The problem is controlled. Pertinent negatives include no chest pain, headaches, palpitations or shortness of breath. There are no associated agents to hypertension. Risk factors for coronary artery disease include post-menopausal state. Past treatments include ACE inhibitors (hctz stopped due to low sodium level).   Elevated A1C - noted on recent labs.  Pt has never been diagnosed with diabetes.  Breast cancer BRCA2 -  S/p bilateral mastectomy.  Finished chemotherapy and soon to start Arimidex.  She also has consultation with GYN regarding possible hysterectomy. From last Oncology note:  stage IB pT2 pN0 M0 ER + PR+ HER2 negative right breast cancer, grade 3, +LVI,   -BRCA2 positive Oncotype 31. Patient is status post 1 cycle of AC, chemotherapy plan was switched to docetaxel and carboplatin after consulting expert opinion.   She has finished 3 cycle of docetaxel and carboplatin. Labs reviewed and discussed with patient.  Patient prefers to finish total 4 cycles of TC.  She tolerates well. Proceed with cycle 4 TC with growth factor support. She is postmenopausal  Discussed with her about the rationale and side effects of aromatase inhibitor.  Rationale of using aromatase inhibitor -Arimidex  discussed with patient.  Side effects of Arimidex including but not limited to hot flush, joint pain, fatigue, mood swing, osteoporosis discussed with patient. Patient voices understanding and willing to proceed.  I will send her a prescription of Arimidex 1 mg daily to start 3 weeks from now. We discussed briefly about bone density.  Also adjuvant bisphosphonate.  Lab Results  Component Value Date   CREATININE 0.58  05/24/2019   BUN 12 05/24/2019   NA 133 (L) 05/24/2019   K 3.9 05/24/2019   CL 101 05/24/2019   CO2 20 (L) 05/24/2019   Lab Results  Component Value Date   CHOL 281 (H) 11/23/2018   HDL 59 11/23/2018   LDLCALC 196 (H) 11/23/2018   TRIG 131 11/23/2018   CHOLHDL 4.8 (H) 11/23/2018   Lab Results  Component Value Date   TSH 1.830 11/23/2018   Lab Results  Component Value Date   HGBA1C 6.9 (H) 04/19/2019   Immunization History  Administered Date(s) Administered  . Influenza,inj,Quad PF,6+ Mos 12/20/2014  . Influenza,inj,quad, With Preservative 02/09/2019  . Influenza-Unspecified 02/09/2019  . PPD Test 11/18/2016  . Tdap 09/20/2011  . Zoster Recombinat (Shingrix) 02/22/2019      Review of Systems  Constitutional: Negative for chills, fatigue, fever and unexpected weight change.  HENT: Positive for trouble swallowing (intermittent ).   Eyes: Negative for visual disturbance.  Respiratory: Negative for cough, chest tightness and shortness of breath.   Cardiovascular: Negative for chest pain, palpitations and leg swelling.  Neurological: Positive for light-headedness. Negative for dizziness, tremors, numbness and headaches.  Psychiatric/Behavioral: Negative for dysphoric mood and sleep disturbance. The patient is not nervous/anxious.     Patient Active Problem List   Diagnosis Date Noted  . Goals of care, counseling/discussion 03/26/2019  . Hyponatremia 03/08/2019  . Constipation 03/08/2019  . Chest wall pain following surgery 03/08/2019  . Breast cancer ([place]) 01/14/2019  . Encounter for antineoplastic chemotherapy 12/30/2018  . BRCA2 gene mutation positive in female 12/30/2018  . Family history of  pancreatic cancer   . Family history of lung cancer   . Family history of BRCA2 gene positive   . Family history of colon cancer   . Malignant neoplasm of lower-outer quadrant of right breast of female, estrogen receptor positive ([place]) 12/17/2018  . Hyperlipidemia, mixed  11/19/2016  . Tubular adenoma of colon 02/21/2015  . Tobacco use disorder 11/27/2014  . Essential hypertension 11/27/2014  . Pap smear abnormality of cervix with ASCUS favoring benign 09/07/2011    No Known Allergies  Past Surgical History:  Procedure Laterality Date  . AXILLARY SENTINEL NODE BIOPSY Bilateral 01/14/2019   Procedure: AXILLARY SENTINEL NODE BIOPSY;  Surgeon: Herbert Pun, MD;  Location: [facility];  Service: General;  Laterality: Bilateral;  . BREAST BIOPSY Right 12/08/2018   [place] bx of mass with calcs path pending  . BREAST BIOPSY Right 12/08/2018   [place] bx of LN, path pending  . BREAST RECONSTRUCTION WITH PLACEMENT OF TISSUE EXPANDER AND ALLODERM Bilateral 01/14/2019   Procedure: BILATERAL BREAST RECONSTRUCTION WITH PLACEMENT OF TISSUE EXPANDER AND FLEX HD;  Surgeon: Wallace Going, DO;  Location: [facility];  Service: Plastics;  Laterality: Bilateral;  . COLONOSCOPY WITH PROPOFOL N/A 02/17/2015   Procedure: COLONOSCOPY WITH PROPOFOL;  Surgeon: Lucilla Lame, MD;  Location: [place];  Service: Endoscopy;  Laterality: N/A;  . POLYPECTOMY  02/17/2015   Procedure: POLYPECTOMY;  Surgeon: Lucilla Lame, MD;  Location: [place];  Service: Endoscopy;;  . PORTACATH PLACEMENT Left 02/18/2019   Procedure: INSERTION PORT-A-CATH;  Surgeon: Herbert Pun, MD;  Location: [facility];  Service: General;  Laterality: Left;  . SIMPLE MASTECTOMY WITH AXILLARY SENTINEL NODE BIOPSY Left 01/14/2019   Procedure: LEFT SIMPLE MASTECTOMY;  Surgeon: Herbert Pun, MD;  Location: [facility];  Service: General;  Laterality: Left;  . TOTAL MASTECTOMY Right 01/14/2019   Procedure: RIGHT TOTAL MASTECTOMY;  Surgeon: Herbert Pun, MD;  Location: [facility];  Service: General;  Laterality: Right;  . WISDOM TOOTH EXTRACTION      Social History   Tobacco Use  . Smoking status: Former Smoker    Packs/day: 0.05    Years: 20.00    Pack years: 1.00    Types:  Cigarettes    Quit date: 01/07/2019    Years since quitting: 0.3  . Smokeless tobacco: Never Used  . Tobacco comment: 3 cig. per day  Substance Use Topics  . Alcohol use: Yes    Alcohol/week: 2.0 standard drinks    Types: 2 Standard drinks or equivalent per week    Comment: occasional  . Drug use: No     Medication list has been reviewed and updated.  Current Meds  Medication Sig  . acetaminophen (TYLENOL) 500 MG tablet Take 500 mg by mouth every 6 (six) hours as needed for mild pain.   Marland Kitchen anastrozole (ARIMIDEX) 1 MG tablet Take 1 tablet (1 mg total) by mouth daily.  . DULoxetine (CYMBALTA) 60 MG capsule Take 1 capsule (60 mg total) by mouth daily.  Marland Kitchen lidocaine-prilocaine (EMLA) cream Apply 1 application topically as needed. Place a pea size amount to port 1-2 hours before chemotherapy.  Marland Kitchen lisinopril (ZESTRIL) 20 MG tablet Take 1 tablet (20 mg total) by mouth daily.  Marland Kitchen loratadine (CLARITIN) 10 MG tablet Take 10 mg by mouth daily.  Marland Kitchen omeprazole (PRILOSEC) 40 MG capsule Take 1 capsule (40 mg total) by mouth daily.  . ondansetron (ZOFRAN) 8 MG tablet Take 1 tablet (8 mg total) by mouth 2 (two) times daily as  needed for refractory nausea / vomiting. Start on day 3 after chemo.  . prochlorperazine (COMPAZINE) 10 MG tablet Take 1 tablet (10 mg total) by mouth every 6 (six) hours as needed (Nausea or vomiting).    PHQ 2/9 Scores 06/01/2019 11/23/2018 11/20/2017 11/18/2016  PHQ - 2 Score 4 0 0 0  PHQ- 9 Score 10 0 - -    BP Readings from Last 3 Encounters:  06/01/19 130/78  05/24/19 138/86  05/03/19 128/85    Physical Exam Vitals and nursing note reviewed.  Constitutional:      General: She is not in acute distress.    Appearance: Normal appearance. She is well-developed.  HENT:     Head: Normocephalic and atraumatic.  Neck:     Vascular: No carotid bruit.  Cardiovascular:     Rate and Rhythm: Normal rate and regular rhythm.  Pulmonary:     Effort: Pulmonary effort is normal. No  respiratory distress.     Breath sounds: No wheezing or rhonchi.  Musculoskeletal:     Right lower leg: No edema.     Left lower leg: No edema.  Lymphadenopathy:     Cervical: No cervical adenopathy.  Skin:    General: Skin is warm and dry.     Capillary Refill: Capillary refill takes less than 2 seconds.     Findings: No rash.  Neurological:     General: No focal deficit present.     Mental Status: She is alert and oriented to person, place, and time.     Gait: Gait normal.  Psychiatric:        Behavior: Behavior normal.        Thought Content: Thought content normal.     Wt Readings from Last 3 Encounters:  06/01/19 158 lb (71.7 kg)  05/24/19 163 lb 4.8 oz (74.1 kg)  05/03/19 157 lb 8 oz (71.4 kg)    BP 130/78   Pulse (!) 104   Temp 98.4 F (36.9 C) (Oral)   Ht _0  (1.651 m)   Wt 158 lb (71.7 kg)   SpO2 98%   BMI 26.29 kg/m   Assessment and Plan: 1. Essential hypertension Clinically stable exam with well controlled BP on lisinopril alone. Tolerating medications without side effects at this time. Pt to continue current regimen and low sodium diet; benefits of regular exercise as able discussed.  2. Elevated hemoglobin A1c May be due to recent health issues, surgery and chemotherapy Will continue low carb diet and recheck in 3 months  3. Tubular adenoma of colon Due for 5 yr follow up - Ambulatory referral to Gastroenterology   Partially dictated using [place]. Any errors are unintentional.  Halina Maidens, MD [facility]  06/01/2019

## 2019-06-02 ENCOUNTER — Telehealth: Payer: Self-pay

## 2019-06-02 NOTE — Telephone Encounter (Signed)
Gastroenterology Pre-Procedure Review  Request Date: Monday Will Call Her Back after Upcoming Procedures Requesting Physician: Dr. Allen Norris  PATIENT REVIEW QUESTIONS: The patient responded to the following health history questions as indicated:    1. Are you having any GI issues? no 2. Do you have a personal history of Polyps? yes (tubular adenoma noted in referral) 3. Do you have a family history of Colon Cancer or Polyps? yes (Mother colon cancer) 4. Diabetes Mellitus? no 5. Joint replacements in the past 12 months?no 6. Major health problems in the past 3 months?yes (October Double Mastectomy, November Port placed, March Fallopian and Ovaries ) 7. Any artificial heart valves, MVP, or defibrillator?no    MEDICATIONS & ALLERGIES:    Patient reports the following regarding taking any anticoagulation/antiplatelet therapy:   Plavix, Coumadin, Eliquis, Xarelto, Lovenox, Pradaxa, Brilinta, or Effient? no Aspirin? no  Patient confirms/reports the following medications:  Current Outpatient Medications  Medication Sig Dispense Refill  . acetaminophen (TYLENOL) 500 MG tablet Take 500 mg by mouth every 6 (six) hours as needed for mild pain.     Marland Kitchen anastrozole (ARIMIDEX) 1 MG tablet Take 1 tablet (1 mg total) by mouth daily. 90 tablet 1  . diazepam (VALIUM) 2 MG tablet Take 1 tablet (2 mg total) by mouth every 12 (twelve) hours as needed for muscle spasms. (Patient not taking: Reported on 06/01/2019) 20 tablet 0  . DULoxetine (CYMBALTA) 60 MG capsule Take 1 capsule (60 mg total) by mouth daily. 90 capsule 3  . lidocaine-prilocaine (EMLA) cream Apply 1 application topically as needed. Place a pea size amount to port 1-2 hours before chemotherapy. 30 g 3  . lisinopril (ZESTRIL) 20 MG tablet Take 1 tablet (20 mg total) by mouth daily. 90 tablet 0  . loratadine (CLARITIN) 10 MG tablet Take 10 mg by mouth daily.    Marland Kitchen omeprazole (PRILOSEC) 40 MG capsule Take 1 capsule (40 mg total) by mouth daily. 90 capsule  1  . ondansetron (ZOFRAN) 8 MG tablet Take 1 tablet (8 mg total) by mouth 2 (two) times daily as needed for refractory nausea / vomiting. Start on day 3 after chemo. 30 tablet 1  . prochlorperazine (COMPAZINE) 10 MG tablet Take 1 tablet (10 mg total) by mouth every 6 (six) hours as needed (Nausea or vomiting). 30 tablet 1   No current facility-administered medications for this visit.    Patient confirms/reports the following allergies:  No Known Allergies  No orders of the defined types were placed in this encounter.   AUTHORIZATION INFORMATION Primary Insurance: 1D#: Group #:  Secondary Insurance: 1D#: Group #:  SCHEDULE INFORMATION: Date: Patient will schedule after her March procedure. Time: Location:

## 2019-06-14 ENCOUNTER — Ambulatory Visit (INDEPENDENT_AMBULATORY_CARE_PROVIDER_SITE_OTHER): Payer: BC Managed Care – PPO

## 2019-06-14 ENCOUNTER — Encounter: Payer: Self-pay | Admitting: Obstetrics and Gynecology

## 2019-06-14 ENCOUNTER — Ambulatory Visit (INDEPENDENT_AMBULATORY_CARE_PROVIDER_SITE_OTHER): Payer: BC Managed Care – PPO | Admitting: Obstetrics and Gynecology

## 2019-06-14 ENCOUNTER — Other Ambulatory Visit: Payer: Self-pay

## 2019-06-14 VITALS — BP 130/80 | Ht 65.0 in | Wt 158.0 lb

## 2019-06-14 DIAGNOSIS — Z1509 Genetic susceptibility to other malignant neoplasm: Secondary | ICD-10-CM | POA: Diagnosis not present

## 2019-06-14 DIAGNOSIS — N938 Other specified abnormal uterine and vaginal bleeding: Secondary | ICD-10-CM | POA: Diagnosis not present

## 2019-06-14 DIAGNOSIS — Z1501 Genetic susceptibility to malignant neoplasm of breast: Secondary | ICD-10-CM

## 2019-06-14 DIAGNOSIS — D251 Intramural leiomyoma of uterus: Secondary | ICD-10-CM

## 2019-06-14 DIAGNOSIS — Z1502 Genetic susceptibility to malignant neoplasm of ovary: Secondary | ICD-10-CM

## 2019-06-14 DIAGNOSIS — Z803 Family history of malignant neoplasm of breast: Secondary | ICD-10-CM

## 2019-06-14 NOTE — Patient Instructions (Signed)
Hysterectomy Information  A hysterectomy is a surgery in which the uterus is removed. The fallopian tubes and ovaries may be removed (bilateral salpingo-oophorectomy) as well. This procedure may be done to treat various medical problems. After the procedure, a woman will no longer have menstrual periods nor will she be able to become pregnant (sterile). What are the reasons for a hysterectomy? There are many reasons why a woman might have this procedure. They include:  Persistent, abnormal vaginal bleeding.  Long-term (chronic) pelvic pain or infection.  Endometriosis. This is when the lining of the uterus (endometrium) starts to grow outside the uterus.  Adenomyosis. This is when the endometrium starts to grow in the muscle of the uterus.  Pelvic organ prolapse. This is a condition in which the uterus falls down into the vagina.  Noncancerous growths in the uterus (uterine fibroids) that cause symptoms.  The presence of precancerous cells.  Cervical or uterine cancer. What are the different types of hysterectomy? There are three different types of hysterectomy:  Supracervical hysterectomy. In this type, the top part of the uterus is removed, but not the cervix.  Total hysterectomy. In this type, the uterus and cervix are removed.  Radical hysterectomy. In this type, the uterus, the cervix, and the tissue that holds the uterus in place (parametrium) are removed. What are the different ways a hysterectomy can be performed? There are many different ways a hysterectomy can be performed, including:  Abdominal hysterectomy. In this type, an incision is made in the abdomen. The uterus is removed through this incision.  Vaginal hysterectomy. In this type, an incision is made in the vagina. The uterus is removed through this incision. There are no abdominal incisions.  Conventional laparoscopic hysterectomy. In this type, three or four small incisions are made in the abdomen. A thin,  lighted tube with a camera (laparoscope) is inserted into one of the incisions. Other tools are put through the other incisions. The uterus is cut into small pieces. The small pieces are removed through the incisions or through the vagina.  Laparoscopically assisted vaginal hysterectomy (LAVH). In this type, three or four small incisions are made in the abdomen. Part of the surgery is performed laparoscopically and the other part is done vaginally. The uterus is removed through the vagina.  Robot-assisted laparoscopic hysterectomy. In this type, a laparoscope and other tools are inserted into three or four small incisions in the abdomen. A computer-controlled device is used to give the surgeon a 3D image and to help control the surgical instruments. This allows for more precise movements of surgical instruments. The uterus is cut into small pieces and removed through the incisions or removed through the vagina. Discuss the options with your health care provider to determine which type is the right one for you. What are the risks? Generally, this is a safe procedure. However, problems may occur, including:  Bleeding and risk of blood transfusion. Tell your health care provider if you do not want to receive any blood products.  Blood clots in the legs or lung.  Infection.  Damage to other structures or organs.  Allergic reactions to medicines.  Changing to an abdominal hysterectomy from one of the other techniques. What to expect after a hysterectomy  You will be given pain medicine.  You may need to stay in the hospital for 1- 2 days to recover, depending on the type of hysterectomy you had.  Follow your health care provider's instructions about exercise, driving, and general activities. Ask your   health care provider what activities are safe for you.  You will need to have someone with you for the first 3-5 days after you go home.  You will need to follow up with your surgeon in 2-4  weeks after surgery to evaluate your progress.  If the ovaries are removed, you will have early menopause symptoms such as hot flashes, night sweats, and insomnia.  If you had a hysterectomy for a problem that was not cancer or not a condition that could lead to cancer, then you no longer need Pap tests. However, even if you no longer need a Pap test, a regular pelvic exam is a good idea to make sure no other problems are developing. Questions to ask your health care provider  Is a hysterectomy medically necessary? Do I have other treatment options for my condition?  What are my options for hysterectomy procedure?  What organs and tissues need to be removed?  What are the risks?  What are the benefits?  How long will I need to stay in the hospital after the procedure?  How long will I need to recover at home?  What symptoms can I expect after the procedure? Summary  A hysterectomy is a surgery in which the uterus is removed. The fallopian tubes and ovaries may be removed (bilateral salpingo-oophorectomy) as well.  This procedure may be done to treat various medical problems. After the procedure, a woman will no longer have menstrual periods nor will she be able to become pregnant.  Discuss the options with your health care provider to determine which type of hysterectomy is the right one for you. This information is not intended to replace advice given to you by your health care provider. Make sure you discuss any questions you have with your health care provider. Document Revised: 03/07/2017 Document Reviewed: 05/01/2016 Elsevier Patient Education  2020 Elsevier Inc.  

## 2019-06-14 NOTE — Progress Notes (Signed)
Patient ID: Cheryl Mooney, female   DOB: 08/03/1965, 54 y.o.   MRN: [id number]  Reason for Consult: Follow-up (U/S follow up )   Referred by Cheryl Hess, MD  Subjective:     HPI:  Cheryl Mooney is a 54 y.o. female she is following up today for history of BRCA2.  She has recently completed chemotherapy for breast cancer.  She is now interested in having a bilateral nephrectomy bilateral salpingectomy and hysterectomy as breast reducing surgery.  Past Medical History:  Diagnosis Date  . Benign neoplasm of sigmoid colon   . Benign neoplasm of transverse colon   . Breast cancer ([place])   . Family history of BRCA2 gene positive   . Family history of breast cancer   . Family history of colon cancer   . Family history of lung cancer   . Family history of pancreatic cancer   . GERD (gastroesophageal reflux disease)   . History of kidney stones   . Hypertension   . Malignant neoplasm of lower-outer quadrant of right breast of female, estrogen receptor positive ([place]) 12/17/2018  . Smoker    Family History  Problem Relation Age of Onset  . Colon cancer Mother 41  . Breast cancer Mother 24       "BRCA2+"  . Hypertension Father   . Stroke Father   . Alzheimer's disease Father   . Prostate cancer Father   . Breast cancer Maternal Aunt        mat great aunt  . Breast cancer Maternal Grandmother 88  . Pancreatic cancer Maternal Grandfather 38   Past Surgical History:  Procedure Laterality Date  . AXILLARY SENTINEL NODE BIOPSY Bilateral 01/14/2019   Procedure: AXILLARY SENTINEL NODE BIOPSY;  Surgeon: Cheryl Pun, MD;  Location: [facility];  Service: General;  Laterality: Bilateral;  . BREAST BIOPSY Right 12/08/2018   [place] bx of mass with calcs path pending  . BREAST BIOPSY Right 12/08/2018   [place] bx of LN, path pending  . BREAST RECONSTRUCTION WITH PLACEMENT OF TISSUE EXPANDER AND ALLODERM Bilateral 01/14/2019   Procedure: BILATERAL BREAST RECONSTRUCTION WITH PLACEMENT  OF TISSUE EXPANDER AND FLEX HD;  Surgeon: Cheryl Going, DO;  Location: [facility];  Service: Plastics;  Laterality: Bilateral;  . COLONOSCOPY WITH PROPOFOL N/A 02/17/2015   Procedure: COLONOSCOPY WITH PROPOFOL;  Surgeon: Cheryl Lame, MD;  Location: [place];  Service: Endoscopy;  Laterality: N/A;  . POLYPECTOMY  02/17/2015   Procedure: POLYPECTOMY;  Surgeon: Cheryl Lame, MD;  Location: [place];  Service: Endoscopy;;  . PORTACATH PLACEMENT Left 02/18/2019   Procedure: INSERTION PORT-A-CATH;  Surgeon: Cheryl Pun, MD;  Location: [facility];  Service: General;  Laterality: Left;  . SIMPLE MASTECTOMY WITH AXILLARY SENTINEL NODE BIOPSY Left 01/14/2019   Procedure: LEFT SIMPLE MASTECTOMY;  Surgeon: Cheryl Pun, MD;  Location: [facility];  Service: General;  Laterality: Left;  . TOTAL MASTECTOMY Right 01/14/2019   Procedure: RIGHT TOTAL MASTECTOMY;  Surgeon: Cheryl Pun, MD;  Location: [facility];  Service: General;  Laterality: Right;  . WISDOM TOOTH EXTRACTION      Short Social History:  Social History   Tobacco Use  . Smoking status: Former Smoker    Packs/day: 0.05    Years: 20.00    Pack years: 1.00    Types: Cigarettes    Quit date: 01/07/2019    Years since quitting: 0.4  . Smokeless tobacco: Never Used  . Tobacco comment: 3 cig. per day  Substance Use Topics  . Alcohol use: Yes    Alcohol/week: 2.0 standard drinks    Types: 2 Standard drinks or equivalent per week    Comment: occasional    No Known Allergies  Current Outpatient Medications  Medication Sig Dispense Refill  . acetaminophen (TYLENOL) 500 MG tablet Take 500 mg by mouth every 6 (six) hours as needed for mild pain.     Cheryl Mooney anastrozole (ARIMIDEX) 1 MG tablet Take 1 tablet (1 mg total) by mouth daily. 90 tablet 1  . DULoxetine (CYMBALTA) 60 MG capsule Take 1 capsule (60 mg total) by mouth daily. 90 capsule 3  . lidocaine-prilocaine (EMLA) cream Apply 1 application  topically as needed. Place a pea size amount to port 1-2 hours before chemotherapy. 30 g 3  . lisinopril (ZESTRIL) 20 MG tablet Take 1 tablet (20 mg total) by mouth daily. 90 tablet 0  . loratadine (CLARITIN) 10 MG tablet Take 10 mg by mouth daily.    Cheryl Mooney omeprazole (PRILOSEC) 40 MG capsule Take 1 capsule (40 mg total) by mouth daily. 90 capsule 1  . ondansetron (ZOFRAN) 8 MG tablet Take 1 tablet (8 mg total) by mouth 2 (two) times daily as needed for refractory nausea / vomiting. Start on day 3 after chemo. 30 tablet 1  . prochlorperazine (COMPAZINE) 10 MG tablet Take 1 tablet (10 mg total) by mouth every 6 (six) hours as needed (Nausea or vomiting). 30 tablet 1  . diazepam (VALIUM) 2 MG tablet Take 1 tablet (2 mg total) by mouth every 12 (twelve) hours as needed for muscle spasms. (Patient not taking: Reported on 06/01/2019) 20 tablet 0   No current facility-administered medications for this visit.    Review of Systems  Constitutional: Negative for chills, fatigue, fever and unexpected weight change.  HENT: Negative for trouble swallowing.  Eyes: Negative for loss of vision.  Respiratory: Negative for cough, shortness of breath and wheezing.  Cardiovascular: Negative for chest pain, leg swelling, palpitations and syncope.  GI: Negative for abdominal pain, blood in stool, diarrhea, nausea and vomiting.  GU: Negative for difficulty urinating, dysuria, frequency and hematuria.  Musculoskeletal: Negative for back pain, leg pain and joint pain.  Skin: Negative for rash.  Neurological: Negative for dizziness, headaches, light-headedness, numbness and seizures.  Psychiatric: Negative for behavioral problem, confusion, depressed mood and sleep disturbance.        Objective:  Objective   Vitals:   06/14/19 1610  BP: 130/80  Weight: 158 lb (71.7 kg)  Height: _0  (1.651 m)   Body mass index is 26.29 kg/m.  Physical Exam Vitals and nursing note reviewed.  Constitutional:       Appearance: She is well-developed.  HENT:     Head: Normocephalic and atraumatic.  Eyes:     Pupils: Pupils are equal, round, and reactive to light.  Cardiovascular:     Rate and Rhythm: Normal rate and regular rhythm.  Pulmonary:     Effort: Pulmonary effort is normal. No respiratory distress.  Skin:    General: Skin is warm and dry.  Neurological:     Mental Status: She is alert and oriented to person, place, and time.  Psychiatric:        Behavior: Behavior normal.        Thought Content: Thought content normal.        Judgment: Judgment normal.   Pelvic ultrasound today is normal.      Assessment/Plan:     54 year old, BRCA2 positive Is  interested in bilateral oophorectomy salpingectomy and hysterectomy for risk reduction of ovarian cancer.  I have discussed options and expected postsurgical course.  Discussed possible complications of the surgery including infection bleeding and damage to surrounding pelvic organs.  We will proceed with this procedure,  will send note to schedule.   More than 15 minutes were spent face to face with the patient in the room with more than 50% of the time spent providing counseling and discussing the plan of management.     Adrian Prows MD [facility] 06/14/2019 4:33 PM

## 2019-06-21 ENCOUNTER — Encounter: Payer: Self-pay | Admitting: *Deleted

## 2019-06-22 ENCOUNTER — Telehealth: Payer: Self-pay | Admitting: Obstetrics and Gynecology

## 2019-06-22 NOTE — Telephone Encounter (Signed)
-----   Message from Homero Fellers, MD sent at 06/14/2019  4:44 PM EST ----- Surgery Booking Request Patient Full Name:  Cheryl Mooney  MRN: [id number]  DOB: 08/14/1965  Surgeon: Homero Fellers, MD  Requested Surgery Date and Time:Spring 2021 Primary Diagnosis AND Code: Z15. 01  Secondary Diagnosis and Code:  Surgical Procedure: TLH/BSO/ cystoscioy L&D Notification: No Admission Status: same day surgery Length of Surgery: 2 hours Special Case Needs: No H&P: Yes Phone Interview???:  Yes Interpreter: No Language:  Medical Clearance:  yes Special Scheduling Instructions: none Any known health/anesthesia issues, diabetes, sleep apnea, latex allergy, defibrillator/pacemaker?: No Acuity: P3   (P1 highest, P2 delay may cause harm, P3 low, elective gyn, P4 lowest)

## 2019-06-22 NOTE — Telephone Encounter (Signed)
Pt returned call to schedule surgery but is asking for Dr Gilman Schmidt to adv regarding the scheduling of her surgery, colonoscopy and Covid vac. She wants to make sure that all waiting periods of time between each are addressed properly to avoid potential complications.  I will discuss with Dr. Gilman Schmidt and call her back to adv. She was offered several dates in April and would be good with 4/20 should it work according to Dr [employer] recommendation.

## 2019-06-22 NOTE — Telephone Encounter (Signed)
LM for pt to rtn call. 

## 2019-06-23 NOTE — Telephone Encounter (Signed)
Called pt to confirm surgery date.  DOS 4/29 w Dr Gilman Schmidt  H&P 4/15 @ 8:50 am  Covid 4/27 8-10:30 am, [facility] [employer], drive up and wear mask. Adv pt to quar after testing until DOS.  Conf that pt uses MyChart and adv that she will also have a phone appt with Pre-admit. The date and time will be listed in her MyChart.  Adv that she may rec phone cals from the [place] and/or the pre service center prior to surgery.  Conf pt prim/only ins is [employer]

## 2019-06-24 ENCOUNTER — Other Ambulatory Visit: Payer: Self-pay

## 2019-06-24 ENCOUNTER — Telehealth: Payer: Self-pay

## 2019-06-24 ENCOUNTER — Other Ambulatory Visit: Payer: Self-pay | Admitting: Obstetrics and Gynecology

## 2019-06-24 DIAGNOSIS — Z87898 Personal history of other specified conditions: Secondary | ICD-10-CM

## 2019-06-24 DIAGNOSIS — D126 Benign neoplasm of colon, unspecified: Secondary | ICD-10-CM

## 2019-06-24 NOTE — Telephone Encounter (Signed)
Gastroenterology Pre-Procedure Review  Request Date: Monday 07/12/19 Requesting Physician: Dr. Allen Norris  PATIENT REVIEW QUESTIONS: The patient responded to the following health history questions as indicated:    1. Are you having any GI issues? no 2. Do you have a personal history of Polyps? yes (yes previous colonoscopy) 3. Do you have a family history of Colon Cancer or Polyps? yes (mother colon cancer) 4. Diabetes Mellitus? no 5. Joint replacements in the past 12 months?no 6. Major health problems in the past 3 months?no 7. Any artificial heart valves, MVP, or defibrillator?no    MEDICATIONS & ALLERGIES:    Patient reports the following regarding taking any anticoagulation/antiplatelet therapy:   Plavix, Coumadin, Eliquis, Xarelto, Lovenox, Pradaxa, Brilinta, or Effient? no Aspirin? no  Patient confirms/reports the following medications:  Current Outpatient Medications  Medication Sig Dispense Refill  . acetaminophen (TYLENOL) 500 MG tablet Take 500 mg by mouth every 6 (six) hours as needed for mild pain.     Marland Kitchen anastrozole (ARIMIDEX) 1 MG tablet Take 1 tablet (1 mg total) by mouth daily. 90 tablet 1  . diazepam (VALIUM) 2 MG tablet Take 1 tablet (2 mg total) by mouth every 12 (twelve) hours as needed for muscle spasms. (Patient not taking: Reported on 06/01/2019) 20 tablet 0  . DULoxetine (CYMBALTA) 60 MG capsule Take 1 capsule (60 mg total) by mouth daily. 90 capsule 3  . lidocaine-prilocaine (EMLA) cream Apply 1 application topically as needed. Place a pea size amount to port 1-2 hours before chemotherapy. 30 g 3  . lisinopril (ZESTRIL) 20 MG tablet Take 1 tablet (20 mg total) by mouth daily. 90 tablet 0  . loratadine (CLARITIN) 10 MG tablet Take 10 mg by mouth daily.    Marland Kitchen omeprazole (PRILOSEC) 40 MG capsule Take 1 capsule (40 mg total) by mouth daily. 90 capsule 1  . ondansetron (ZOFRAN) 8 MG tablet Take 1 tablet (8 mg total) by mouth 2 (two) times daily as needed for refractory  nausea / vomiting. Start on day 3 after chemo. 30 tablet 1  . prochlorperazine (COMPAZINE) 10 MG tablet Take 1 tablet (10 mg total) by mouth every 6 (six) hours as needed (Nausea or vomiting). 30 tablet 1   No current facility-administered medications for this visit.    Patient confirms/reports the following allergies:  No Known Allergies  No orders of the defined types were placed in this encounter.   AUTHORIZATION INFORMATION Primary Insurance: 1D#: Group #:  Secondary Insurance: 1D#: Group #:  SCHEDULE INFORMATION: Date: Monday 07/12/19 Time: Location:[facility]

## 2019-06-25 NOTE — Telephone Encounter (Signed)
Faxed medical clearance to PCP

## 2019-06-28 ENCOUNTER — Telehealth: Payer: Self-pay | Admitting: Gastroenterology

## 2019-06-28 NOTE — Telephone Encounter (Signed)
Cheryl Mooney from Cheryl Mooney in Cheryl Mooney left vm  The suprep is not covered under pt insurance and the Cheryl Mooney is currently on back order please call to give alternative options

## 2019-06-29 NOTE — Telephone Encounter (Signed)
LVM asking patient if we could call her bowel prep to [facility] because they are the only pharmacy that has generic bowel prep.  I will await her call back before sending [place] bowel prep to [facility] [phone].  Thanks,  [place], [place]

## 2019-06-30 ENCOUNTER — Telehealth: Payer: Self-pay

## 2019-06-30 ENCOUNTER — Other Ambulatory Visit: Payer: Self-pay

## 2019-06-30 MED ORDER — PEG 3350-KCL-NA BICARB-NACL 420 G PO SOLR: 4000.0000 mL | Freq: Once | ORAL | 0 refills | Status: AC

## 2019-06-30 NOTE — Telephone Encounter (Signed)
Prescription for Randal Buba has been sent to [facility] in [place].  Rx was sent electronically.  LVM to let patient know the rx has been sent and provided phone number to [facility].  Thanks,  [place], [place]

## 2019-07-01 ENCOUNTER — Other Ambulatory Visit: Payer: Self-pay

## 2019-07-01 ENCOUNTER — Encounter: Payer: Self-pay | Admitting: Gastroenterology

## 2019-07-05 ENCOUNTER — Inpatient Hospital Stay: Payer: BC Managed Care – PPO | Attending: Oncology

## 2019-07-05 DIAGNOSIS — C50511 Malignant neoplasm of lower-outer quadrant of right female breast: Secondary | ICD-10-CM | POA: Diagnosis not present

## 2019-07-05 DIAGNOSIS — E871 Hypo-osmolality and hyponatremia: Secondary | ICD-10-CM

## 2019-07-05 DIAGNOSIS — C50919 Malignant neoplasm of unspecified site of unspecified female breast: Secondary | ICD-10-CM

## 2019-07-05 DIAGNOSIS — Z170 Estrogen receptor positive status [ER+]: Secondary | ICD-10-CM | POA: Diagnosis not present

## 2019-07-05 LAB — COMPREHENSIVE METABOLIC PANEL
ALT: 14 U/L (ref 0–44)
AST: 19 U/L (ref 15–41)
Albumin: 4.1 g/dL (ref 3.5–5.0)
Alkaline Phosphatase: 103 U/L (ref 38–126)
Anion gap: 8 (ref 5–15)
BUN: 8 mg/dL (ref 6–20)
CO2: 23 mmol/L (ref 22–32)
Calcium: 9.1 mg/dL (ref 8.9–10.3)
Chloride: 105 mmol/L (ref 98–111)
Creatinine, Ser: 0.61 mg/dL (ref 0.44–1.00)
GFR calc Af Amer: >60 mL/min (ref >60)
GFR calc non Af Amer: >60 mL/min (ref >60)
Glucose, Bld: 158 mg/dL — ABNORMAL HIGH (ref 70–99)
Potassium: 3.5 mmol/L (ref 3.5–5.1)
Sodium: 136 mmol/L (ref 135–145)
Total Bilirubin: 0.3 mg/dL (ref 0.3–1.2)
Total Protein: 7.1 g/dL (ref 6.5–8.1)

## 2019-07-05 LAB — CBC WITH DIFFERENTIAL/PLATELET
Abs Immature Granulocytes: 0 10*3/uL (ref 0.00–0.07)
Basophils Absolute: 0.1 10*3/uL (ref 0.0–0.1)
Basophils Relative: 1 %
Eosinophils Absolute: 0.1 10*3/uL (ref 0.0–0.5)
Eosinophils Relative: 2 %
HCT: 38.5 % (ref 36.0–46.0)
Hemoglobin: 12.6 g/dL (ref 12.0–15.0)
Immature Granulocytes: 0 %
Lymphocytes Relative: 38 %
Lymphs Abs: 2.4 10*3/uL (ref 0.7–4.0)
MCH: 31.8 pg (ref 26.0–34.0)
MCHC: 32.7 g/dL (ref 30.0–36.0)
MCV: 97.2 fL (ref 80.0–100.0)
Monocytes Absolute: 0.4 10*3/uL (ref 0.1–1.0)
Monocytes Relative: 6 %
Neutro Abs: 3.3 10*3/uL (ref 1.7–7.7)
Neutrophils Relative %: 53 %
Platelets: 284 10*3/uL (ref 150–400)
RBC: 3.96 MIL/uL (ref 3.87–5.11)
RDW: 14.7 % (ref 11.5–15.5)
WBC: 6.2 10*3/uL (ref 4.0–10.5)
nRBC: 0 % (ref 0.0–0.2)

## 2019-07-05 MED ORDER — SODIUM CHLORIDE 0.9% FLUSH
10.0000 mL | Freq: Once | INTRAVENOUS | Status: AC | PRN
  Administered 2019-07-05: 10 mL
  Filled 2019-07-05: qty 10

## 2019-07-05 MED ORDER — HEPARIN SOD (PORK) LOCK FLUSH 100 UNIT/ML IV SOLN
500.0000 [IU] | Freq: Once | INTRAVENOUS | Status: AC | PRN
  Administered 2019-07-05: 500 [IU]
  Filled 2019-07-05: qty 5

## 2019-07-06 ENCOUNTER — Other Ambulatory Visit: Payer: Self-pay | Admitting: Internal Medicine

## 2019-07-06 ENCOUNTER — Other Ambulatory Visit: Payer: Self-pay

## 2019-07-06 ENCOUNTER — Ambulatory Visit (INDEPENDENT_AMBULATORY_CARE_PROVIDER_SITE_OTHER): Payer: BC Managed Care – PPO | Admitting: Plastic Surgery

## 2019-07-06 ENCOUNTER — Encounter: Payer: Self-pay | Admitting: Plastic Surgery

## 2019-07-06 VITALS — BP 146/82 | HR 84 | Temp 98.0°F | Ht 65.0 in | Wt 158.0 lb

## 2019-07-06 DIAGNOSIS — I10 Essential (primary) hypertension: Secondary | ICD-10-CM

## 2019-07-06 DIAGNOSIS — Z9013 Acquired absence of bilateral breasts and nipples: Secondary | ICD-10-CM | POA: Diagnosis not present

## 2019-07-06 DIAGNOSIS — Z1501 Genetic susceptibility to malignant neoplasm of breast: Secondary | ICD-10-CM | POA: Diagnosis not present

## 2019-07-06 DIAGNOSIS — Z1509 Genetic susceptibility to other malignant neoplasm: Secondary | ICD-10-CM

## 2019-07-06 DIAGNOSIS — Z1502 Genetic susceptibility to malignant neoplasm of ovary: Secondary | ICD-10-CM | POA: Diagnosis not present

## 2019-07-06 NOTE — Progress Notes (Signed)
   Subjective:    Patient ID: Cheryl Mooney, female    DOB: [birth year], 54 y.o.   MRN: [id number]  Is a 54 year old female here for follow-up on her bilateral breast reconstruction after mastectomy for breast and BRCA positive gene.  In October 2020 she underwent bilateral mastectomies with expander placement.  She has [place] 535 cc expanders in place.  She did not go through expansion while she was getting her chemotherapy which ended in February.  She is now here to continue with expansion.  Her incisions are healing well.  There is no sign of infection or seroma.   Review of Systems  Constitutional: Negative.   HENT: Negative.   Eyes: Negative.   Respiratory: Negative.   Cardiovascular: Negative.   Gastrointestinal: Negative.   Endocrine: Negative.   Genitourinary: Negative.   Hematological: Negative.        Objective:   Physical Exam Vitals and nursing note reviewed.  Constitutional:      Appearance: Normal appearance.  HENT:     Head: Normocephalic and atraumatic.  Cardiovascular:     Rate and Rhythm: Normal rate.     Pulses: Normal pulses.  Pulmonary:     Effort: Pulmonary effort is normal.  Neurological:     General: No focal deficit present.     Mental Status: She is alert. Mental status is at baseline.  Psychiatric:        Mood and Affect: Mood normal.        Behavior: Behavior normal.         Assessment & Plan:     ICD-10-CM   1. BRCA2 gene mutation positive in female  Z15.01    Z15.02    Z15.09   2. Acquired absence of bilateral breasts and nipples  Z90.13     We will plan on continuing her expansion.  I would like to see her back in 2 weeks.  We placed injectable saline in the Expander using a sterile technique: Right: 50 cc for a total of 250 / 535 cc Left: 50 cc for a total of 250 / 535 cc

## 2019-07-07 ENCOUNTER — Inpatient Hospital Stay: Payer: BC Managed Care – PPO

## 2019-07-07 ENCOUNTER — Telehealth: Payer: Self-pay

## 2019-07-07 ENCOUNTER — Inpatient Hospital Stay: Payer: BC Managed Care – PPO | Admitting: Oncology

## 2019-07-07 NOTE — Telephone Encounter (Signed)
Patient insurance would not pay for original Prep medication. Was issued a generic brand but is now confused bc the directions are different than the directions issued by the nurse. Please call to advise

## 2019-07-07 NOTE — Telephone Encounter (Signed)
Left vm with instructions on how to take her generic bowel prep.

## 2019-07-08 ENCOUNTER — Other Ambulatory Visit: Payer: Self-pay

## 2019-07-08 ENCOUNTER — Ambulatory Visit: Payer: BC Managed Care – PPO | Admitting: Oncology

## 2019-07-08 ENCOUNTER — Inpatient Hospital Stay: Payer: BC Managed Care – PPO | Attending: Oncology | Admitting: Oncology

## 2019-07-08 ENCOUNTER — Encounter: Payer: Self-pay | Admitting: Oncology

## 2019-07-08 ENCOUNTER — Other Ambulatory Visit: Payer: BC Managed Care – PPO

## 2019-07-08 ENCOUNTER — Other Ambulatory Visit
Admission: RE | Admit: 2019-07-08 | Discharge: 2019-07-08 | Disposition: A | Discharge status: Home / Self Care | Payer: BC Managed Care – PPO | Source: Ambulatory Visit | Attending: Gastroenterology | Admitting: Gastroenterology

## 2019-07-08 VITALS — BP 143/90 | HR 70 | Temp 96.9°F | Resp 16 | Wt 160.1 lb

## 2019-07-08 DIAGNOSIS — Z79899 Other long term (current) drug therapy: Secondary | ICD-10-CM | POA: Insufficient documentation

## 2019-07-08 DIAGNOSIS — Z1501 Genetic susceptibility to malignant neoplasm of breast: Secondary | ICD-10-CM | POA: Insufficient documentation

## 2019-07-08 DIAGNOSIS — I10 Essential (primary) hypertension: Secondary | ICD-10-CM | POA: Insufficient documentation

## 2019-07-08 DIAGNOSIS — Z20822 Contact with and (suspected) exposure to covid-19: Secondary | ICD-10-CM | POA: Diagnosis not present

## 2019-07-08 DIAGNOSIS — Z01812 Encounter for preprocedural laboratory examination: Secondary | ICD-10-CM | POA: Insufficient documentation

## 2019-07-08 DIAGNOSIS — C50511 Malignant neoplasm of lower-outer quadrant of right female breast: Secondary | ICD-10-CM | POA: Insufficient documentation

## 2019-07-08 DIAGNOSIS — Z803 Family history of malignant neoplasm of breast: Secondary | ICD-10-CM | POA: Diagnosis not present

## 2019-07-08 DIAGNOSIS — Z86010 Personal history of colonic polyps: Secondary | ICD-10-CM | POA: Insufficient documentation

## 2019-07-08 DIAGNOSIS — Z1509 Genetic susceptibility to other malignant neoplasm: Secondary | ICD-10-CM

## 2019-07-08 DIAGNOSIS — Z95828 Presence of other vascular implants and grafts: Secondary | ICD-10-CM

## 2019-07-08 DIAGNOSIS — Z800 Family history of malignant neoplasm of digestive organs: Secondary | ICD-10-CM | POA: Insufficient documentation

## 2019-07-08 DIAGNOSIS — Z87891 Personal history of nicotine dependence: Secondary | ICD-10-CM | POA: Diagnosis not present

## 2019-07-08 DIAGNOSIS — Z87442 Personal history of urinary calculi: Secondary | ICD-10-CM | POA: Diagnosis not present

## 2019-07-08 DIAGNOSIS — Z801 Family history of malignant neoplasm of trachea, bronchus and lung: Secondary | ICD-10-CM | POA: Diagnosis not present

## 2019-07-08 DIAGNOSIS — Z79811 Long term (current) use of aromatase inhibitors: Secondary | ICD-10-CM | POA: Insufficient documentation

## 2019-07-08 DIAGNOSIS — Z1502 Genetic susceptibility to malignant neoplasm of ovary: Secondary | ICD-10-CM

## 2019-07-08 DIAGNOSIS — Z170 Estrogen receptor positive status [ER+]: Secondary | ICD-10-CM | POA: Insufficient documentation

## 2019-07-08 DIAGNOSIS — K219 Gastro-esophageal reflux disease without esophagitis: Secondary | ICD-10-CM | POA: Diagnosis not present

## 2019-07-08 NOTE — Discharge Instructions (Signed)
General Anesthesia, Adult, Care After This sheet gives you information about how to care for yourself after your procedure. Your health care provider may also give you more specific instructions. If you have problems or questions, contact your health care provider. What can I expect after the procedure? After the procedure, the following side effects are common:  Pain or discomfort at the IV site.  Nausea.  Vomiting.  Sore throat.  Trouble concentrating.  Feeling cold or chills.  Weak or tired.  Sleepiness and fatigue.  Soreness and body aches. These side effects can affect parts of the body that were not involved in surgery. Follow these instructions at home:  For at least 24 hours after the procedure:  Have a responsible adult stay with you. It is important to have someone help care for you until you are awake and alert.  Rest as needed.  Do not: ? Participate in activities in which you could fall or become injured. ? Drive. ? Use heavy machinery. ? Drink alcohol. ? Take sleeping pills or medicines that cause drowsiness. ? Make important decisions or sign legal documents. ? Take care of children on your own. Eating and drinking  Follow any instructions from your health care provider about eating or drinking restrictions.  When you feel hungry, start by eating small amounts of foods that are soft and easy to digest (bland), such as toast. Gradually return to your regular diet.  Drink enough fluid to keep your urine pale yellow.  If you vomit, rehydrate by drinking water, juice, or clear broth. General instructions  If you have sleep apnea, surgery and certain medicines can increase your risk for breathing problems. Follow instructions from your health care provider about wearing your sleep device: ? Anytime you are sleeping, including during daytime naps. ? While taking prescription pain medicines, sleeping medicines, or medicines that make you drowsy.  Return to  your normal activities as told by your health care provider. Ask your health care provider what activities are safe for you.  Take over-the-counter and prescription medicines only as told by your health care provider.  If you smoke, do not smoke without supervision.  Keep all follow-up visits as told by your health care provider. This is important. Contact a health care provider if:  You have nausea or vomiting that does not get better with medicine.  You cannot eat or drink without vomiting.  You have pain that does not get better with medicine.  You are unable to pass urine.  You develop a skin rash.  You have a fever.  You have redness around your IV site that gets worse. Get help right away if:  You have difficulty breathing.  You have chest pain.  You have blood in your urine or stool, or you vomit blood. Summary  After the procedure, it is common to have a sore throat or nausea. It is also common to feel tired.  Have a responsible adult stay with you for the first 24 hours after general anesthesia. It is important to have someone help care for you until you are awake and alert.  When you feel hungry, start by eating small amounts of foods that are soft and easy to digest (bland), such as toast. Gradually return to your regular diet.  Drink enough fluid to keep your urine pale yellow.  Return to your normal activities as told by your health care provider. Ask your health care provider what activities are safe for you. This information is not   intended to replace advice given to you by your health care provider. Make sure you discuss any questions you have with your health care provider. Document Revised: 03/28/2017 Document Reviewed: 11/08/2016 Elsevier Patient Education  2020 Elsevier Inc.  

## 2019-07-08 NOTE — Progress Notes (Signed)
Hematology/Oncology follow up  note [facility] [phone] Fax:([phone]) [phone]   Patient Care Team: Glean Hess, MD as PCP - General (Internal Medicine) Jannet Mantis, MD (Dermatology)  CHIEF COMPLAINTS/REASON FOR VISIT:  Follow-up for breast cancer.  HISTORY OF PRESENTING ILLNESS:  Cheryl Mooney is a  54 y.o.  female with PMH listed below who was referred to me for evaluation of breast cancer Patient reports feeling mass of her right breast week before her annual mammogram. 12/03/2018 patient had bilateral diagnostic mammogram and ultrasound  which showed suspicious 2.1 x 1.3 x 1.6 irregular mass at 7:00 in the right breast, 2 cm from the nipple. 2 borderline lymph nodes are seen in the right axilla.  1 of the nodes demonstrate a cortex of 4.4 mm.  Both lymph nodes demonstrated retention of fatty hila. Patient underwent ultrasound-guided biopsy of right breast mass as well as ultrasound-guided biopsy of 1 of the 2 mildly abnormal right axillary lymph nodes. 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 Right axilla lymph node negative for malignancy. ER> 90% positive, PR11-50% positive, HER-2 equivocal, 2+ by IHC.  FISH negative.   Nipple discharge: Denies Family history: Mother diagnosed with breast cancer at age of 54, and colon cancer at age of 35.  Mother has BRCA2 mutation.   Maternal grandmother breast cancer, maternal great aunt breast cancer.  Patient recalls that she was tested long time ago and was not aware of any results.  OCP use: In her 20s-30s Estrogen and progesterone therapy: denies History of radiation to chest: denies.  Previous breast surgery: Denies  BRCA 2 positive.  # 01/14/2019 patient underwent bilateral mastectomy with sentinel lymph node biopsy of left and the right axillary. Right breast showed invasive mammary carcinoma, DCIS  positive, apocrine metaplastic, stromal fibrosis, duct ectasia, simple cyst formation, usual epithelial hyperplasia.  Sentinel lymph node on the right side was negative. pT2 pN0 Left prophylactic mastectomy and a sentinel lymph node negative for malignancy.  #OncotypeDX recurrence score 31, adjuvant chemotherapy benefit more than 15%. # She follows up with plastic surgeon Dr. Marla Roe, she has expander, expanding process is being during chemotherapy. Mediport was placed by Dr. Peyton Najjar on 02/18/2019.  Patient is status post 1 cycle of AC, chemotherapy plan was switched to docetaxel and carboplatin after consulting expert opinion.   She has finished 4 cycle of docetaxel and carboplatin.  INTERVAL HISTORY Cheryl Mooney is a 54 y.o. female who has above history reviewed by me today presents for follow up visit for management of stage I breast cancer, ER PR positive, HER-2 negative, hereditary BRCA 2 mutation.  Problems and complaints are listed below:  patient has been on  Arimidex and she tolerates well.  Intermittent hot flash, manageable.  No new complaints. She has followed up with gynecology and plan to have prophylactic ovary and K[place] tube removal at the end of April. She also follows up with plastic surgeon for reconstruction. ..   Review of Systems  Constitutional: Negative for appetite change, chills, fatigue and fever.  HENT:   Negative for hearing loss and voice change.   Eyes: Negative for eye problems.  Respiratory: Negative for chest tightness and cough.   Cardiovascular: Negative for chest pain.  Gastrointestinal: Negative for abdominal distention, abdominal pain, blood in stool and diarrhea.  Endocrine: Positive for hot flashes.  Genitourinary: Negative for difficulty urinating and frequency.   Musculoskeletal: Negative for arthralgias.  Skin: Negative  for itching and rash.  Neurological: Negative for extremity weakness.  Hematological: Negative for adenopathy.   Psychiatric/Behavioral: Negative for confusion. The patient is not nervous/anxious.     MEDICAL HISTORY:  Past Medical History:  Diagnosis Date  . Benign neoplasm of sigmoid colon   . Benign neoplasm of transverse colon   . Breast cancer ([place])   . Family history of BRCA2 gene positive   . Family history of breast cancer   . Family history of colon cancer   . Family history of lung cancer   . Family history of pancreatic cancer   . GERD (gastroesophageal reflux disease)   . History of kidney stones   . Hypertension   . Malignant neoplasm of lower-outer quadrant of right breast of female, estrogen receptor positive ([place]) 12/17/2018  . Smoker     SURGICAL HISTORY: Past Surgical History:  Procedure Laterality Date  . AXILLARY SENTINEL NODE BIOPSY Bilateral 01/14/2019   Procedure: AXILLARY SENTINEL NODE BIOPSY;  Surgeon: Herbert Pun, MD;  Location: [facility];  Service: General;  Laterality: Bilateral;  . BREAST BIOPSY Right 12/08/2018   [place] bx of mass with calcs path pending  . BREAST BIOPSY Right 12/08/2018   [place] bx of LN, path pending  . BREAST RECONSTRUCTION WITH PLACEMENT OF TISSUE EXPANDER AND ALLODERM Bilateral 01/14/2019   Procedure: BILATERAL BREAST RECONSTRUCTION WITH PLACEMENT OF TISSUE EXPANDER AND FLEX HD;  Surgeon: Wallace Going, DO;  Location: [facility];  Service: Plastics;  Laterality: Bilateral;  . COLONOSCOPY WITH PROPOFOL N/A 02/17/2015   Procedure: COLONOSCOPY WITH PROPOFOL;  Surgeon: Lucilla Lame, MD;  Location: [place];  Service: Endoscopy;  Laterality: N/A;  . POLYPECTOMY  02/17/2015   Procedure: POLYPECTOMY;  Surgeon: Lucilla Lame, MD;  Location: [place];  Service: Endoscopy;;  . PORTACATH PLACEMENT Left 02/18/2019   Procedure: INSERTION PORT-A-CATH;  Surgeon: Herbert Pun, MD;  Location: [facility];  Service: General;  Laterality: Left;  . SIMPLE MASTECTOMY WITH AXILLARY SENTINEL NODE BIOPSY Left 01/14/2019   Procedure:  LEFT SIMPLE MASTECTOMY;  Surgeon: Herbert Pun, MD;  Location: [facility];  Service: General;  Laterality: Left;  . TOTAL MASTECTOMY Right 01/14/2019   Procedure: RIGHT TOTAL MASTECTOMY;  Surgeon: Herbert Pun, MD;  Location: [facility];  Service: General;  Laterality: Right;  . WISDOM TOOTH EXTRACTION      SOCIAL HISTORY: Social History   Socioeconomic History  . Marital status: Married    Spouse name: Not on file  . Number of children: Not on file  . Years of education: Not on file  . Highest education level: Not on file  Occupational History  . Not on file  Tobacco Use  . Smoking status: Former Smoker    Packs/day: 0.05    Years: 20.00    Pack years: 1.00    Types: Cigarettes    Quit date: 01/07/2019    Years since quitting: 0.4  . Smokeless tobacco: Never Used  . Tobacco comment: 3 cig. per day  Substance and Sexual Activity  . Alcohol use: Yes    Alcohol/week: 2.0 standard drinks    Types: 2 Standard drinks or equivalent per week    Comment: occasional  . Drug use: No  . Sexual activity: Yes    Birth control/protection: Post-menopausal  Other Topics Concern  . Not on file  Social History Narrative  . Not on file   Social Determinants of Health   Financial Resource Strain:   . Difficulty of Paying Living Expenses:  Food Insecurity:   . Worried About Charity fundraiser in the Last Year:   . Arboriculturist in the Last Year:   Transportation Needs:   . Film/video editor (Medical):   Marland Kitchen Lack of Transportation (Non-Medical):   Physical Activity:   . Days of Exercise per Week:   . Minutes of Exercise per Session:   Stress:   . Feeling of Stress :   Social Connections:   . Frequency of Communication with Friends and Family:   . Frequency of Social Gatherings with Friends and Family:   . Attends Religious Services:   . Active Member of Clubs or Organizations:   . Attends Archivist Meetings:   Marland Kitchen Marital Status:   Intimate Partner  Violence:   . Fear of Current or Ex-Partner:   . Emotionally Abused:   Marland Kitchen Physically Abused:   . Sexually Abused:   She is a retired Pharmacist, hospital  FAMILY HISTORY: Family History  Problem Relation Age of Onset  . Colon cancer Mother 63  . Breast cancer Mother 90       "BRCA2+"  . Hypertension Father   . Stroke Father   . Alzheimer's disease Father   . Prostate cancer Father   . Breast cancer Maternal Aunt        mat great aunt  . Breast cancer Maternal Grandmother 29  . Pancreatic cancer Maternal Grandfather 60    ALLERGIES:  has No Known Allergies.  MEDICATIONS:  Current Outpatient Medications  Medication Sig Dispense Refill  . acetaminophen (TYLENOL) 500 MG tablet Take 500 mg by mouth every 6 (six) hours as needed for mild pain.     Marland Kitchen anastrozole (ARIMIDEX) 1 MG tablet Take 1 tablet (1 mg total) by mouth daily. 90 tablet 1  . cetirizine (ZYRTEC) 10 MG tablet Take 10 mg by mouth daily.    . DULoxetine (CYMBALTA) 60 MG capsule Take 1 capsule (60 mg total) by mouth daily. 90 capsule 3  . lidocaine-prilocaine (EMLA) cream Apply 1 application topically as needed. Place a pea size amount to port 1-2 hours before chemotherapy. 30 g 3  . lisinopril (ZESTRIL) 20 MG tablet Take 1 tablet by mouth once daily 90 tablet 3  . omeprazole (PRILOSEC) 40 MG capsule Take 1 capsule (40 mg total) by mouth daily. 90 capsule 1  . ondansetron (ZOFRAN) 8 MG tablet Take 1 tablet (8 mg total) by mouth 2 (two) times daily as needed for refractory nausea / vomiting. Start on day 3 after chemo. (Patient not taking: Reported on 07/08/2019) 30 tablet 1  . prochlorperazine (COMPAZINE) 10 MG tablet Take 1 tablet (10 mg total) by mouth every 6 (six) hours as needed (Nausea or vomiting). (Patient not taking: Reported on 07/08/2019) 30 tablet 1   No current facility-administered medications for this visit.     PHYSICAL EXAMINATION: ECOG PERFORMANCE STATUS: 0 - Asymptomatic Vitals:   07/08/19 0916  BP: (!) 143/90   Pulse: 70  Resp: 16  Temp: (!) 96.9 F (36.1 C)   Filed Weights   07/08/19 0916  Weight: 160 lb 1.6 oz (72.6 kg)    Physical Exam Constitutional:      General: She is not in acute distress. HENT:     Head: Normocephalic and atraumatic.  Eyes:     General: No scleral icterus.    Pupils: Pupils are equal, round, and reactive to light.  Cardiovascular:     Rate and Rhythm: Normal rate and regular rhythm.  Heart sounds: Normal heart sounds.  Pulmonary:     Effort: Pulmonary effort is normal. No respiratory distress.     Breath sounds: No wheezing.  Abdominal:     General: Bowel sounds are normal. There is no distension.     Palpations: Abdomen is soft. There is no mass.     Tenderness: There is no abdominal tenderness.  Musculoskeletal:        General: No deformity. Normal range of motion.     Cervical back: Normal range of motion and neck supple.  Skin:    General: Skin is warm and dry.     Findings: No erythema or rash.  Neurological:     Mental Status: She is alert and oriented to person, place, and time. Mental status is at baseline.     Cranial Nerves: No cranial nerve deficit.     Coordination: Coordination normal.  Psychiatric:        Mood and Affect: Mood normal.    LABORATORY DATA:  I have reviewed the data as listed Lab Results  Component Value Date   WBC 6.2 07/05/2019   HGB 12.6 07/05/2019   HCT 38.5 07/05/2019   MCV 97.2 07/05/2019   PLT 284 07/05/2019   Recent Labs    05/03/19 0818 05/24/19 0806 07/05/19 1309  NA 135 133* 136  K 3.5 3.9 3.5  CL 103 101 105  CO2 21* 20* 23  GLUCOSE 176* 231* 158*  BUN 12 12 8   CREATININE 0.57 0.58 0.61  CALCIUM 9.5 9.2 9.1  GFRNONAA >60 >60 >60  GFRAA >60 >60 >60  PROT 7.3 7.1 7.1  ALBUMIN 4.2 4.0 4.1  AST 20 23 19   ALT 16 18 14   ALKPHOS 94 95 103  BILITOT 0.5 0.4 0.3   Iron/TIBC/Ferritin/ %Sat No results found for: IRON, TIBC, FERRITIN, IRONPCTSAT   RADIOGRAPHIC STUDIES: I have personally  reviewed the radiological images as listed and agreed with the findings in the report. US PELVIS TRANSVAGINAL NON-OB (TV ONLY)  Result Date: 06/15/2019 Patient Name: Cheryl Mooney DOB: 1965/05/06 MRN: [id number] ULTRASOUND REPORT Location: [facility] Date of Service: 06/14/2019 Indications:Abnormal Uterine Bleeding Findings: The uterus is retroverted and measures 4.8 x 3.9 x 3.4 cm. Echo texture is heterogenous with evidence of focal masses. Within the uterus are multiple suspected fibroids measuring: Fibroid 1:14.7 x 10.5 x 12.8 mm intramural anterior The Endometrium measures 5.1 mm. Right Ovary measures 2.2 x 0.9 x 1.7 cm. It is normal in appearance. Left Ovary measures 1.5 x 1.0 x 1.0 cm. It is normal in appearance. Survey of the adnexa demonstrates no adnexal masses. There is no free fluid in the cul de sac. Impression: 1. The endometrium is 60m, this is within normal limits for a patient without postmenopausal bleeding 2. There is one intramural fibroid seen. 3. Normal appearing ovaries. Recommendations: 1.Clinical correlation with the patient's History and Physical Exam. EGweneth Dimitri RT I have reviewed this ultrasound and the report. I agree with the above assessment and plan. [facility] 06/15/19 11:40 AM       ASSESSMENT & PLAN:  1. Malignant neoplasm of lower-outer quadrant of right breast of female, estrogen receptor positive [place]   2. BRCA2 gene mutation positive in female   3. Port-A-Cath in place    Stage IB pT2 pN0 M0 ER + PR+ HER2 negative right breast cancer, grade 3, +LVI,   -BRCA2 positive She tolerates adjuvant endocrine therapy with Arimidex well.  Continue current regimen. Recommend DEXA scan.  Patient wants to discuss with plastic surgeon to see if there is any contraindication for her to get DEXA during her breast reconstruction process. We discussed about the rationale for adjuvant Zometa pending awaiting dental  clearance. Recommend calcium and vitamin D supplementation. #BRCA 2 gene mutation positive, patient has establish care with gynecology for further evaluation.   Patient has had ultrasound pelvis and is planned to have prophylactic ovary removal at the end of April.  Continue follow-up with GYN. Screening for pancreatic cancer can be considered.  I will wait until she finishes GYN surgeries.  #Port-A-Cath in place, recommend port flush every 6 to 8 weeks.  Continue port flush.  All questions were answered. The patient knows to call the clinic with any problems questions or concerns. Follow-up in 3 months.   Earlie Server, MD, PhD Hematology Oncology [facility] at [facility] Pager- [phone] 07/08/2019

## 2019-07-09 LAB — SARS CORONAVIRUS 2 (TAT 6-24 HRS): SARS Coronavirus 2: NEGATIVE

## 2019-07-12 ENCOUNTER — Encounter: Payer: Self-pay | Admitting: Gastroenterology

## 2019-07-12 ENCOUNTER — Ambulatory Visit
Admission: RE | Admit: 2019-07-12 | Discharge: 2019-07-12 | Disposition: A | Discharge status: Home / Self Care | Payer: BC Managed Care – PPO | Source: Ambulatory Visit | Attending: Gastroenterology | Admitting: Gastroenterology

## 2019-07-12 ENCOUNTER — Encounter
Admission: RE | Disposition: A | Discharge status: Home / Self Care | Payer: Self-pay | Source: Ambulatory Visit | Attending: Gastroenterology

## 2019-07-12 ENCOUNTER — Ambulatory Visit: Payer: BC Managed Care – PPO | Admitting: Anesthesiology

## 2019-07-12 ENCOUNTER — Other Ambulatory Visit: Payer: Self-pay

## 2019-07-12 DIAGNOSIS — Z800 Family history of malignant neoplasm of digestive organs: Secondary | ICD-10-CM | POA: Diagnosis not present

## 2019-07-12 DIAGNOSIS — Z79899 Other long term (current) drug therapy: Secondary | ICD-10-CM | POA: Diagnosis not present

## 2019-07-12 DIAGNOSIS — K641 Second degree hemorrhoids: Secondary | ICD-10-CM | POA: Diagnosis not present

## 2019-07-12 DIAGNOSIS — Z860100 Personal history of colon polyps, unspecified: Secondary | ICD-10-CM

## 2019-07-12 DIAGNOSIS — I10 Essential (primary) hypertension: Secondary | ICD-10-CM | POA: Diagnosis not present

## 2019-07-12 DIAGNOSIS — C50511 Malignant neoplasm of lower-outer quadrant of right female breast: Secondary | ICD-10-CM | POA: Diagnosis not present

## 2019-07-12 DIAGNOSIS — Z170 Estrogen receptor positive status [ER+]: Secondary | ICD-10-CM | POA: Diagnosis not present

## 2019-07-12 DIAGNOSIS — Z79811 Long term (current) use of aromatase inhibitors: Secondary | ICD-10-CM | POA: Insufficient documentation

## 2019-07-12 DIAGNOSIS — Z09 Encounter for follow-up examination after completed treatment for conditions other than malignant neoplasm: Secondary | ICD-10-CM | POA: Diagnosis present

## 2019-07-12 DIAGNOSIS — K219 Gastro-esophageal reflux disease without esophagitis: Secondary | ICD-10-CM | POA: Diagnosis not present

## 2019-07-12 DIAGNOSIS — K635 Polyp of colon: Secondary | ICD-10-CM

## 2019-07-12 DIAGNOSIS — D122 Benign neoplasm of ascending colon: Secondary | ICD-10-CM | POA: Diagnosis not present

## 2019-07-12 DIAGNOSIS — Z87891 Personal history of nicotine dependence: Secondary | ICD-10-CM | POA: Insufficient documentation

## 2019-07-12 HISTORY — PX: POLYPECTOMY: SHX5525

## 2019-07-12 HISTORY — PX: COLONOSCOPY WITH PROPOFOL: SHX5780

## 2019-07-12 SURGERY — COLONOSCOPY WITH PROPOFOL
Anesthesia: General | Site: Rectum

## 2019-07-12 MED ORDER — PROPOFOL 10 MG/ML IV BOLUS
INTRAVENOUS | Status: DC | PRN
  Administered 2019-07-12: 100 mg via INTRAVENOUS
  Administered 2019-07-12 (×5): 20 mg via INTRAVENOUS

## 2019-07-12 MED ORDER — LIDOCAINE HCL (CARDIAC) PF 100 MG/5ML IV SOSY
PREFILLED_SYRINGE | INTRAVENOUS | Status: DC | PRN
  Administered 2019-07-12: 50 mg via INTRAVENOUS

## 2019-07-12 MED ORDER — LACTATED RINGERS IV SOLN: INTRAVENOUS | Status: DC | PRN

## 2019-07-12 MED ORDER — STERILE WATER FOR IRRIGATION IR SOLN
Status: DC | PRN
  Administered 2019-07-12: 50 mL

## 2019-07-12 SURGICAL SUPPLY — 8 items
CANISTER SUCT 1200ML W/VALVE (MISCELLANEOUS) ×2 IMPLANT
FORCEPS BIOP RAD 4 LRG CAP 4 (CUTTING FORCEPS) ×2 IMPLANT
GOWN CVR UNV OPN BCK APRN NK (MISCELLANEOUS) ×2 IMPLANT
GOWN ISOL THUMB LOOP REG UNIV (MISCELLANEOUS) ×2
KIT ENDO PROCEDURE OLY (KITS) ×2 IMPLANT
SNARE SHORT THROW 13M SML OVAL (MISCELLANEOUS) ×2 IMPLANT
TRAP ETRAP POLY (MISCELLANEOUS) ×2 IMPLANT
WATER STERILE IRR 250ML POUR (IV SOLUTION) ×2 IMPLANT

## 2019-07-12 NOTE — H&P (Signed)
Lucilla Lame, MD [address] [place], [place] [address] Phone:[phone] Fax : [phone]  Primary Care Physician:  Glean Hess, MD Primary Gastroenterologist:  Dr. Allen Norris  Pre-Procedure History & Physical: HPI:  Cheryl Mooney is a 54 y.o. female is here for an colonoscopy.   Past Medical History:  Diagnosis Date  . Benign neoplasm of sigmoid colon   . Benign neoplasm of transverse colon   . Breast cancer ([place])   . Family history of BRCA2 gene positive   . Family history of breast cancer   . Family history of colon cancer   . Family history of lung cancer   . Family history of pancreatic cancer   . GERD (gastroesophageal reflux disease)   . History of kidney stones   . Hypertension   . Malignant neoplasm of lower-outer quadrant of right breast of female, estrogen receptor positive ([place]) 12/17/2018  . Smoker     Past Surgical History:  Procedure Laterality Date  . AXILLARY SENTINEL NODE BIOPSY Bilateral 01/14/2019   Procedure: AXILLARY SENTINEL NODE BIOPSY;  Surgeon: Herbert Pun, MD;  Location: [facility];  Service: General;  Laterality: Bilateral;  . BREAST BIOPSY Right 12/08/2018   [place] bx of mass with calcs path pending  . BREAST BIOPSY Right 12/08/2018   [place] bx of LN, path pending  . BREAST RECONSTRUCTION WITH PLACEMENT OF TISSUE EXPANDER AND ALLODERM Bilateral 01/14/2019   Procedure: BILATERAL BREAST RECONSTRUCTION WITH PLACEMENT OF TISSUE EXPANDER AND FLEX HD;  Surgeon: Wallace Going, DO;  Location: [facility];  Service: Plastics;  Laterality: Bilateral;  . COLONOSCOPY WITH PROPOFOL N/A 02/17/2015   Procedure: COLONOSCOPY WITH PROPOFOL;  Surgeon: Lucilla Lame, MD;  Location: [facility];  Service: Endoscopy;  Laterality: N/A;  . POLYPECTOMY  02/17/2015   Procedure: POLYPECTOMY;  Surgeon: Lucilla Lame, MD;  Location: [facility];  Service: Endoscopy;;  . PORTACATH PLACEMENT Left 02/18/2019   Procedure: INSERTION  PORT-A-CATH;  Surgeon: Herbert Pun, MD;  Location: [facility];  Service: General;  Laterality: Left;  . SIMPLE MASTECTOMY WITH AXILLARY SENTINEL NODE BIOPSY Left 01/14/2019   Procedure: LEFT SIMPLE MASTECTOMY;  Surgeon: Herbert Pun, MD;  Location: [facility];  Service: General;  Laterality: Left;  . TOTAL MASTECTOMY Right 01/14/2019   Procedure: RIGHT TOTAL MASTECTOMY;  Surgeon: Herbert Pun, MD;  Location: [facility];  Service: General;  Laterality: Right;  . WISDOM TOOTH EXTRACTION      Prior to Admission medications   Medication Sig Start Date End Date Taking? Authorizing Provider  acetaminophen (TYLENOL) 500 MG tablet Take 500 mg by mouth every 6 (six) hours as needed for mild pain.    Yes [provider]  anastrozole (ARIMIDEX) 1 MG tablet Take 1 tablet (1 mg total) by mouth daily. 05/24/19  Yes Earlie Server, MD  cetirizine (ZYRTEC) 10 MG tablet Take 10 mg by mouth daily.   Yes [provider]  DULoxetine (CYMBALTA) 60 MG capsule Take 1 capsule (60 mg total) by mouth daily. 03/09/19  Yes Verlon Au, NP  lidocaine-prilocaine (EMLA) cream Apply 1 application topically as needed. Place a pea size amount to port 1-2 hours before chemotherapy. 05/03/19  Yes Earlie Server, MD  lisinopril (ZESTRIL) 20 MG tablet Take 1 tablet by mouth once daily 07/06/19  Yes Glean Hess, MD  omeprazole (PRILOSEC) 40 MG capsule Take 1 capsule (40 mg total) by mouth daily. 11/23/18  Yes Glean Hess, MD  ondansetron (ZOFRAN) 8 MG tablet Take  1 tablet (8 mg total) by mouth 2 (two) times daily as needed for refractory nausea / vomiting. Start on day 3 after chemo. Patient not taking: Reported on 07/08/2019 03/14/19   Earlie Server, MD  prochlorperazine (COMPAZINE) 10 MG tablet Take 1 tablet (10 mg total) by mouth every 6 (six) hours as needed (Nausea or vomiting). Patient not taking: Reported on 07/08/2019 03/14/19   Earlie Server, MD    Allergies as of 06/24/2019  . (No Known Allergies)     Family History  Problem Relation Age of Onset  . Colon cancer Mother 46  . Breast cancer Mother 81       "BRCA2+"  . Hypertension Father   . Stroke Father   . Alzheimer's disease Father   . Prostate cancer Father   . Breast cancer Maternal Aunt        mat great aunt  . Breast cancer Maternal Grandmother 24  . Pancreatic cancer Maternal Grandfather 58    Social History   Socioeconomic History  . Marital status: Married    Spouse name: Not on file  . Number of children: Not on file  . Years of education: Not on file  . Highest education level: Not on file  Occupational History  . Not on file  Tobacco Use  . Smoking status: Former Smoker    Packs/day: 0.05    Years: 20.00    Pack years: 1.00    Types: Cigarettes    Quit date: 01/07/2019    Years since quitting: 0.5  . Smokeless tobacco: Never Used  . Tobacco comment: 3 cig. per day  Substance and Sexual Activity  . Alcohol use: Yes    Alcohol/week: 2.0 standard drinks    Types: 2 Standard drinks or equivalent per week    Comment: occasional  . Drug use: No  . Sexual activity: Yes    Birth control/protection: Post-menopausal  Other Topics Concern  . Not on file  Social History Narrative  . Not on file   Social Determinants of Health   Financial Resource Strain:   . Difficulty of Paying Living Expenses:   Food Insecurity:   . Worried About Charity fundraiser in the Last Year:   . Arboriculturist in the Last Year:   Transportation Needs:   . Film/video editor (Medical):   Marland Kitchen Lack of Transportation (Non-Medical):   Physical Activity:   . Days of Exercise per Week:   . Minutes of Exercise per Session:   Stress:   . Feeling of Stress :   Social Connections:   . Frequency of Communication with Friends and Family:   . Frequency of Social Gatherings with Friends and Family:   . Attends Religious Services:   . Active Member of Clubs or Organizations:   . Attends Archivist Meetings:   Marland Kitchen  Marital Status:   Intimate Partner Violence:   . Fear of Current or Ex-Partner:   . Emotionally Abused:   Marland Kitchen Physically Abused:   . Sexually Abused:     Review of Systems: See HPI, otherwise negative ROS  Physical Exam: BP (!) 143/91   Pulse 68   Temp 98.2 F (36.8 C) (Temporal)   Ht 5' 5"  (1.651 m)   Wt 69.9 kg   SpO2 99%   BMI 25.63 kg/m  General:   Alert,  pleasant and cooperative in NAD Head:  Normocephalic and atraumatic. Neck:  Supple; no masses or thyromegaly. Lungs:  Clear throughout to  auscultation.    Heart:  Regular rate and rhythm. Abdomen:  Soft, nontender and nondistended. Normal bowel sounds, without guarding, and without rebound.   Neurologic:  Alert and  oriented x4;  grossly normal neurologically.  Impression/Plan: Cheryl Mooney is here for an colonoscopy to be performed for history of adenomatous polyps 02/17/2015  Risks, benefits, limitations, and alternatives regarding  colonoscopy have been reviewed with the patient.  Questions have been answered.  All parties agreeable.   Lucilla Lame, MD  07/12/2019, 9:27 AM

## 2019-07-12 NOTE — Anesthesia Procedure Notes (Signed)
Procedure Name: MAC Date/Time: 07/12/2019 9:49 AM Performed by: Vanetta Shawl, CRNA Pre-anesthesia Checklist: Patient identified, Emergency Drugs available, Suction available, Timeout performed and Patient being monitored Patient Re-evaluated:Patient Re-evaluated prior to induction Oxygen Delivery Method: Nasal cannula Placement Confirmation: positive ETCO2

## 2019-07-12 NOTE — Anesthesia Postprocedure Evaluation (Signed)
Anesthesia Post Note  Patient: Cheryl Mooney  Procedure(s) Performed: COLONOSCOPY WITH BIOPSY (N/A Rectum) POLYPECTOMY (N/A Rectum)     Patient location during evaluation: PACU Anesthesia Type: General Level of consciousness: awake and alert and oriented Pain management: satisfactory to patient Vital Signs Assessment: post-procedure vital signs reviewed and stable Respiratory status: spontaneous breathing, nonlabored ventilation and respiratory function stable Cardiovascular status: blood pressure returned to baseline and stable Postop Assessment: Adequate PO intake and No signs of nausea or vomiting Anesthetic complications: no    Raliegh Ip

## 2019-07-12 NOTE — Op Note (Signed)
[facility] Gastroenterology Patient Name: Cheryl Mooney Procedure Date: 07/12/2019 9:44 AM MRN: [id number] Account #: [id number] Date of Birth: [birth year] Admit Type: Outpatient Age: 54 Room: [facility] OR ROOM 01 Gender: Female Note Status: Finalized Procedure:             Colonoscopy Indications:           High risk colon cancer surveillance: Personal history                         of colonic polyps Providers:             Lucilla Lame MD, MD Referring MD:          Halina Maidens, MD (Referring MD) Medicines:             Propofol per Anesthesia Complications:         No immediate complications. Procedure:             Pre-Anesthesia Assessment:                        - Prior to the procedure, a History and Physical was                         performed, and patient medications and allergies were                         reviewed. The patient's tolerance of previous                         anesthesia was also reviewed. The risks and benefits                         of the procedure and the sedation options and risks                         were discussed with the patient. All questions were                         answered, and informed consent was obtained. Prior                         Anticoagulants: The patient has taken no previous                         anticoagulant or antiplatelet agents. ASA Grade                         Assessment: II - A patient with mild systemic disease.                         After reviewing the risks and benefits, the patient                         was deemed in satisfactory condition to undergo the                         procedure.  After obtaining informed consent, the colonoscope was                         passed under direct vision. Throughout the procedure,                         the patient's blood pressure, pulse, and oxygen                         saturations were monitored continuously. The was                   introduced through the anus and advanced to the the                         cecum, identified by appendiceal orifice and ileocecal                         valve. The colonoscopy was performed without                         difficulty. The patient tolerated the procedure well.                         The quality of the bowel preparation was excellent. Findings:      The perianal and digital rectal examinations were normal.      A 5 mm polyp was found in the ascending colon. The polyp was sessile.       The polyp was removed with a cold biopsy forceps. Resection and       retrieval were complete.      Two sessile polyps were found in the sigmoid colon. The polyps were 5 to       7 mm in size. These polyps were removed with a cold snare. Resection and       retrieval were complete.      Non-bleeding internal hemorrhoids were found during retroflexion. The       hemorrhoids were Grade II (internal hemorrhoids that prolapse but reduce       spontaneously). Impression:            - One 5 mm polyp in the ascending colon, removed with                         a cold biopsy forceps. Resected and retrieved.                        - Two 5 to 7 mm polyps in the sigmoid colon, removed                         with a cold snare. Resected and retrieved.                        - Non-bleeding internal hemorrhoids. Recommendation:        - Discharge patient to home.                        - Resume previous diet.                        -  Continue present medications.                        - Await pathology results.                        - Repeat colonoscopy in 5 years for surveillance. Procedure Code(s):     --- Professional ---                        [phone], Colonoscopy, flexible; with removal of                         tumor(s), polyp(s), or other lesion(s) by snare                         technique                        45380, 51, Colonoscopy, flexible; with biopsy, single                          or multiple Diagnosis Code(s):     --- Professional ---                        Z86.010, Personal history of colonic polyps                        K63.5, Polyp of colon CPT copyright 2019 American Medical Association. All rights reserved. The codes documented in this report are preliminary and upon coder review may  be revised to meet current compliance requirements. Lucilla Lame MD, MD 07/12/2019 10:09:50 AM This report has been signed electronically. Number of Addenda: 0 Note Initiated On: 07/12/2019 9:44 AM Scope Withdrawal Time: 0 hours 9 minutes 2 seconds  Total Procedure Duration: 0 hours 14 minutes 26 seconds  Estimated Blood Loss:  Estimated blood loss: none.      [facility]

## 2019-07-12 NOTE — Transfer of Care (Signed)
Immediate Anesthesia Transfer of Care Note  Patient: Cheryl Mooney  Procedure(s) Performed: COLONOSCOPY WITH BIOPSY (N/A Rectum) POLYPECTOMY (N/A Rectum)  Patient Location: PACU  Anesthesia Type: General  Level of Consciousness: awake, alert  and patient cooperative  Airway and Oxygen Therapy: Patient Spontanous Breathing and Patient connected to supplemental oxygen  Post-op Assessment: Post-op Vital signs reviewed, Patient's Cardiovascular Status Stable, Respiratory Function Stable, Patent Airway and No signs of Nausea or vomiting  Post-op Vital Signs: Reviewed and stable  Complications: No apparent anesthesia complications

## 2019-07-12 NOTE — Anesthesia Preprocedure Evaluation (Signed)
Anesthesia Evaluation  Patient identified by MRN, date of birth, ID band Patient awake    Reviewed: Allergy & Precautions, H&P , NPO status , Patient's Chart, lab work & pertinent test results  Airway Mallampati: II  TM Distance: >3 FB Neck ROM: full    Dental no notable dental hx.    Pulmonary former smoker,    Pulmonary exam normal breath sounds clear to auscultation       Cardiovascular hypertension, Normal cardiovascular exam Rhythm:regular Rate:Normal     Neuro/Psych    GI/Hepatic GERD  ,  Endo/Other    Renal/GU      Musculoskeletal   Abdominal   Peds  Hematology   Anesthesia Other Findings   Reproductive/Obstetrics                             Anesthesia Physical Anesthesia Plan  ASA: III  Anesthesia Plan: General   Post-op Pain Management:    Induction: Intravenous  PONV Risk Score and Plan: 3 and Treatment may vary due to age or medical condition, TIVA and Propofol infusion  Airway Management Planned: Natural Airway  Additional Equipment:   Intra-op Plan:   Post-operative Plan:   Informed Consent: I have reviewed the patients History and Physical, chart, labs and discussed the procedure including the risks, benefits and alternatives for the proposed anesthesia with the patient or authorized representative who has indicated his/her understanding and acceptance.     Dental Advisory Given  Plan Discussed with: CRNA  Anesthesia Plan Comments:         Anesthesia Quick Evaluation

## 2019-07-13 ENCOUNTER — Encounter: Payer: Self-pay | Admitting: *Deleted

## 2019-07-13 LAB — SURGICAL PATHOLOGY

## 2019-07-15 ENCOUNTER — Encounter: Payer: Self-pay | Admitting: Gastroenterology

## 2019-07-20 ENCOUNTER — Ambulatory Visit (INDEPENDENT_AMBULATORY_CARE_PROVIDER_SITE_OTHER): Payer: BC Managed Care – PPO | Admitting: Surgical

## 2019-07-20 ENCOUNTER — Encounter: Payer: Self-pay | Admitting: Surgical

## 2019-07-20 ENCOUNTER — Other Ambulatory Visit: Payer: Self-pay

## 2019-07-20 VITALS — BP 165/93 | HR 73 | Temp 97.7°F | Ht 65.0 in | Wt 157.8 lb

## 2019-07-20 DIAGNOSIS — Z170 Estrogen receptor positive status [ER+]: Secondary | ICD-10-CM

## 2019-07-20 DIAGNOSIS — Z1501 Genetic susceptibility to malignant neoplasm of breast: Secondary | ICD-10-CM | POA: Diagnosis not present

## 2019-07-20 DIAGNOSIS — Z1502 Genetic susceptibility to malignant neoplasm of ovary: Secondary | ICD-10-CM

## 2019-07-20 DIAGNOSIS — C50511 Malignant neoplasm of lower-outer quadrant of right female breast: Secondary | ICD-10-CM | POA: Diagnosis not present

## 2019-07-20 DIAGNOSIS — Z9013 Acquired absence of bilateral breasts and nipples: Secondary | ICD-10-CM | POA: Diagnosis not present

## 2019-07-20 DIAGNOSIS — Z1509 Genetic susceptibility to other malignant neoplasm: Secondary | ICD-10-CM

## 2019-07-20 NOTE — Progress Notes (Signed)
   Subjective:     Patient ID: Cheryl Mooney, female    DOB: [birth year], 54 y.o.   MRN: [id number]  Chief Complaint  Patient presents with  . Follow-up    HPI: The patient is a 54 y.o. female here for follow-up on her bilateral breast reconstruction after mastectomy for BRCA positive gene.  She has a history of chemotherapy which ended in February.  She is doing well today. She tolerated her last fill fine. Her incisions are healing nicely. No complaints.   Review of Systems  Constitutional: Negative.   Respiratory: Negative.   Cardiovascular: Negative.   Musculoskeletal: Negative.   Skin: Negative.      Objective:   Vital Signs BP (!) 165/93 (BP Location: Left Arm, Patient Position: Sitting, Cuff Size: Normal)   Pulse 73   Temp 97.7 F (36.5 C) (Temporal)   Ht '5\' 5"'$  (1.651 m)   Wt 157 lb 12.8 oz (71.6 kg)   SpO2 98%   BMI 26.26 kg/m  Vital Signs and Nursing Note Reviewed Chaperone present Physical Exam  Constitutional: She is oriented to person, place, and time and well-developed, well-nourished, and in no distress. No distress.  Pulmonary/Chest: Effort normal.    Bilateral mastectomy incisions healing nicely. She has a Port-A-Cath in place along left anterior chest wall. No sign of seroma, hematoma, infection. She has good symmetry.  Musculoskeletal:        General: No edema. Normal range of motion.  Neurological: She is alert and oriented to person, place, and time. Gait normal.  Skin: Skin is warm and dry. No rash noted. She is not diaphoretic. No erythema.  Psychiatric: Mood and affect normal.      Assessment/Plan:     ICD-10-CM   1. BRCA2 gene mutation positive in female  Z15.01    Z15.02    Z15.09   2. Acquired absence of bilateral breasts and nipples  Z90.13   3. Malignant neoplasm of lower-outer quadrant of right breast of female, estrogen receptor positive ([place])  C50.511    Z17.0     Cheryl Mooney is a 54 year old female here for follow-up  after bilateral breast reconstruction, her initial surgery was on January 14, 2019. She subsequently underwent chemotherapy which delayed reconstruction. She has finished chemotherapy and we have begun filling her expanders again.  We placed injectable saline in the Expander using a sterile technique: Right: 50 cc for a total of 330/535 cc Left: 50 cc for a total of 330/535 cc  At her previous visit the total amount in the expanders was miscalculated. Upon EMR review she has a total of 330 cc currently in each expander.  Follow-up in 10 days for additional fill. Call with any questions or concerns. She has no additional surgery scheduled at the end of this month and would like to have 1 more fill prior to that surgery.   Carola Rhine Aleem Elza, PA-C 07/20/2019, 10:20 AM

## 2019-07-22 ENCOUNTER — Telehealth: Payer: Self-pay

## 2019-07-22 ENCOUNTER — Other Ambulatory Visit: Payer: Self-pay

## 2019-07-22 ENCOUNTER — Encounter: Payer: Self-pay | Admitting: Obstetrics and Gynecology

## 2019-07-22 ENCOUNTER — Ambulatory Visit (INDEPENDENT_AMBULATORY_CARE_PROVIDER_SITE_OTHER): Payer: BC Managed Care – PPO | Admitting: Obstetrics and Gynecology

## 2019-07-22 VITALS — BP 136/88 | Ht 65.0 in | Wt 158.0 lb

## 2019-07-22 DIAGNOSIS — Z1501 Genetic susceptibility to malignant neoplasm of breast: Secondary | ICD-10-CM | POA: Diagnosis not present

## 2019-07-22 DIAGNOSIS — Z1509 Genetic susceptibility to other malignant neoplasm: Secondary | ICD-10-CM | POA: Diagnosis not present

## 2019-07-22 DIAGNOSIS — Z1502 Genetic susceptibility to malignant neoplasm of ovary: Secondary | ICD-10-CM | POA: Diagnosis not present

## 2019-07-22 DIAGNOSIS — Z803 Family history of malignant neoplasm of breast: Secondary | ICD-10-CM | POA: Diagnosis not present

## 2019-07-22 NOTE — Telephone Encounter (Signed)
Pt needs DEXA scan, but was going to check with plastic surgeon first. I contacted her and she stated that she saw plastic surgeon earlier this week but forgot to ask. She will message surgeon and call back to let us know.

## 2019-07-23 ENCOUNTER — Other Ambulatory Visit: Payer: Self-pay | Admitting: Internal Medicine

## 2019-07-23 ENCOUNTER — Encounter
Admission: RE | Admit: 2019-07-23 | Discharge: 2019-07-23 | Disposition: A | Discharge status: Home / Self Care | Payer: BC Managed Care – PPO | Source: Ambulatory Visit | Attending: Obstetrics and Gynecology | Admitting: Obstetrics and Gynecology

## 2019-07-23 ENCOUNTER — Encounter: Payer: Self-pay | Admitting: Oncology

## 2019-07-23 ENCOUNTER — Other Ambulatory Visit: Payer: Self-pay

## 2019-07-23 ENCOUNTER — Encounter: Payer: Self-pay | Admitting: Plastic Surgery

## 2019-07-23 DIAGNOSIS — Z01812 Encounter for preprocedural laboratory examination: Secondary | ICD-10-CM | POA: Insufficient documentation

## 2019-07-23 DIAGNOSIS — K219 Gastro-esophageal reflux disease without esophagitis: Secondary | ICD-10-CM

## 2019-07-23 HISTORY — DX: Depression, unspecified: F32.A

## 2019-07-23 HISTORY — DX: Anemia, unspecified: D64.9

## 2019-07-23 NOTE — Patient Instructions (Addendum)
INSTRUCTIONS FOR SURGERY     Your surgery is scheduled for:   Thursday, April 29TH     To find out your arrival time for the day of surgery,          please call [phone] between 1 pm and 3 pm on :  Wednesday, April 28TH     When you arrive for surgery, report to the [facility].       Do NOT stop on the first floor to register.    REMEMBER: Instructions that are not followed completely may result in serious medical risk,  up to and including death, or upon the discretion of your surgeon and anesthesiologist,            your surgery may need to be rescheduled.  __X__ 1. Do not eat food after midnight the night before your procedure.                    No gum, candy, lozenger, tic tacs, tums or hard candies.                  ABSOLUTELY NOTHING SOLID IN YOUR MOUTH AFTER MIDNIGHT                    You may drink unlimited clear liquids up to 2 hours before you are scheduled to arrive for surgery.                   Do not drink anything within those 2 hours unless you need to take medicine, then take the                   smallest amount you need.  Clear liquids include:  water, apple juice without pulp,                   any flavor Gatorade, Black coffee, black tea.  Sugar may be added but no dairy/ honey /lemon.                        Broth and jello is not considered a clear liquid.  __x__  2. On the morning of surgery, please brush your teeth with toothpaste and water. You may rinse with                  mouthwash if you wish but DO NOT SWALLOW TOOTHPASTE OR MOUTHWASH  __X___3. NO alcohol for 24 hours before or after surgery.  __x___ 4.  Do NOT smoke or use e-cigarettes for 24 HOURS PRIOR TO SURGERY.                      DO NOT Use any chewable tobacco products for at least 6 hours prior to surgery.  __x___ 5. If you start any new medication after this appointment and prior to surgery, please        Bring it with you on the day of surgery.  ___x__ 6. Notify your doctor if there is any change in your medical condition, such as fever,  infection, vomitting, diarrhea or any open sores.  __x___ 7.  USE the CHG SOAP as instructed, the night before surgery and the day of surgery.                   Once you have washed with this soap, do NOT use any of the following: Powders, perfumes                    or lotions. Please do not wear make up, hairpins, clips or nail polish. You MAY wear deodorant.                   Men may shave their face and neck.  Women need to shave 48 hours prior to surgery.                   DO NOT wear ANY jewelry on the day of surgery. If there are rings that are too tight to                    remove easily, please address this prior to the surgery day. Piercings need to be removed.                                                                     NO METAL ON YOUR BODY.                    Do NOT bring any valuables.  If you came to Pre-Admit testing then you will not need license,                     insurance card or credit card.  If you will be staying overnight, please either leave your things in                     the car or have your family be responsible for these items.                     [facility] IS NOT RESPONSIBLE FOR BELONGINGS OR VALUABLES.  ___X__ 8. DO NOT wear contact lenses on surgery day.  You may not have dentures,                     Hearing aides, contacts or glasses in the operating room. These items can be                    Placed in the Recovery Room to receive immediately after surgery.  __x___ 9. IF YOU ARE SCHEDULED TO GO HOME ON THE SAME DAY, YOU MUST                   Have someone to drive you home and to stay with you  for the first 24 hours.                    Have an arrangement prior to arriving on surgery day.  ___x__ 10. Take the following medications on the morning of surgery with a sip of water:  1. OMEPRAZOLE/prilosec                     2. CYMBALTA                     3. ARIMEDEX                     4. ZYRTEC                     5. EMLA CREAM                     6.  ___x__ 11.  Follow any instructions provided to you by your surgeon.                        Such as enema, clear liquid bowel prep                     ##PLEASE DRINK ALL OF THE CARBOHYDRATE DRINK ON THE                      MORNING OF SURGERY. HAVE IT CONSUMED BY 2 HOURS PRIOR                        TO ARRIVAL##  __X__  12. STOP ALL ASPIRIN PRODUCTS ONE WEEK BEFORE SURGERY.                        THIS INCLUDES BC POWDERS / GOODIES POWDER  __x___ 13. STOP Anti-inflammatories ONE WEEK BEFORE SURGERY                       This includes IBUPROFEN / MOTRIN / ADVIL / ALEVE/ NAPROXYN                    YOU MAY TAKE TYLENOL ANY TIME PRIOR TO SURGERY.  ___X___17.  Continue to take the following medications but do not take on the morning of surgery:                         LISINOPRIL  ______18. If staying overnight, please have appropriate shoes to wear to be able to walk around the unit.                   Wear clean and comfortable clothing to the hospital.  BRING A PHONE [place]. HAVE LOOSE CLOTHING TO WEAR HOME. PLEASE HAVE A STOOL SOFTENER TO USE ONCE HOME AND TAKING PAIN MEDICATION.

## 2019-07-30 ENCOUNTER — Encounter: Payer: Self-pay | Admitting: Surgical

## 2019-07-30 ENCOUNTER — Other Ambulatory Visit: Payer: Self-pay

## 2019-07-30 ENCOUNTER — Ambulatory Visit (INDEPENDENT_AMBULATORY_CARE_PROVIDER_SITE_OTHER): Payer: BC Managed Care – PPO | Admitting: Surgical

## 2019-07-30 VITALS — BP 160/100 | HR 72 | Temp 97.1°F | Ht 65.0 in | Wt 158.0 lb

## 2019-07-30 DIAGNOSIS — Z9013 Acquired absence of bilateral breasts and nipples: Secondary | ICD-10-CM | POA: Diagnosis not present

## 2019-07-30 DIAGNOSIS — Z1501 Genetic susceptibility to malignant neoplasm of breast: Secondary | ICD-10-CM | POA: Diagnosis not present

## 2019-07-30 DIAGNOSIS — Z1509 Genetic susceptibility to other malignant neoplasm: Secondary | ICD-10-CM

## 2019-07-30 DIAGNOSIS — C50511 Malignant neoplasm of lower-outer quadrant of right female breast: Secondary | ICD-10-CM | POA: Diagnosis not present

## 2019-07-30 DIAGNOSIS — Z1502 Genetic susceptibility to malignant neoplasm of ovary: Secondary | ICD-10-CM | POA: Diagnosis not present

## 2019-07-30 DIAGNOSIS — Z170 Estrogen receptor positive status [ER+]: Secondary | ICD-10-CM

## 2019-07-30 NOTE — Progress Notes (Signed)
   Subjective:     Patient ID: Cheryl Mooney, female    DOB: [birth year], 54 y.o.   MRN: [id number]  Chief Complaint  Patient presents with  . Follow-up    fill    HPI: The patient is a 54 y.o. female here for follow-up on her breast reconstruction.  She tolerated bilateral breast expansion at last appointment fine.  She is having no issues today.  Incisions are healing nicely.  No sign of any infection, seroma, hematoma.  Patient is scheduled to have a DEXA scan, ordering provider asked if patient would discuss with [place] if this is something she can have done with expanders in place.   Patient is also having laparoscopic hysterectomy in 6 days.   Review of Systems  Constitutional: Negative.   Respiratory: Negative.   Cardiovascular: Negative.   Skin: Negative.      Objective:   Vital Signs BP (!) 160/100 (BP Location: Left Arm, Patient Position: Sitting, Cuff Size: Normal)   Pulse 72   Temp (!) 97.1 F (36.2 C) (Temporal)   Ht '5\' 5"'$  (1.651 m)   Wt 158 lb (71.7 kg)   SpO2 100%   BMI 26.29 kg/m  Vital Signs and Nursing Note Reviewed Chaperone present Physical Exam  Constitutional: She is oriented to person, place, and time and well-developed, well-nourished, and in no distress.  Pulmonary/Chest: Effort normal.    Neurological: She is alert and oriented to person, place, and time. Gait normal.  Skin: Skin is warm and dry. No rash noted. No erythema.  Psychiatric: Mood and affect normal.      Assessment/Plan:     ICD-10-CM   1. BRCA2 gene mutation positive in female  Z15.01    Z15.02    Z15.09   2. Acquired absence of bilateral breasts and nipples  Z90.13   3. Malignant neoplasm of lower-outer quadrant of right breast of female, estrogen receptor positive ([place])  C50.511    Z17.0     Patient is a 54 year old female here for expander fill.  She is doing well.  Tolerated last expansion fine.  Incisions are well-healed.  No sign of infection, seroma,  hematoma.  We placed injectable saline in the Expander using a sterile technique: Right: 25 cc for a total of 355 / 535 cc Left: 25 cc for a total of 355 / 535 cc  We only filled 25 cc due to right breast being tight, she has some more room to expand but would like to give her skin a little bit more time to respond to expansion.  In regards to DEXA scan, recommend patient has her PCP discuss with radiology if patient can have DEXA scan with expanders in place due to metal port.  If metal is not an issue or concern then DEXA scan is fine with plastic surgery.  Follow-up in approximately 2 and half weeks, patient is having laparoscopic hysterectomy in 6 days.  I discussed with patient if she is not feeling well at the 2-1/2-week scheduled appointment that she is more than welcome to reschedule as I do not want her to be stressing her body too much and doing more than necessary.  We can always wait to expand.  Call with questions or concerns. Pictures were obtained of the patient and placed in the chart with the patient's or guardian's permission.    Carola Rhine Cheryl Seney, PA-C 07/30/2019, 10:29 AM

## 2019-08-03 ENCOUNTER — Other Ambulatory Visit
Admission: RE | Admit: 2019-08-03 | Discharge: 2019-08-03 | Disposition: A | Discharge status: Home / Self Care | Payer: BC Managed Care – PPO | Source: Ambulatory Visit | Attending: Obstetrics and Gynecology | Admitting: Obstetrics and Gynecology

## 2019-08-03 ENCOUNTER — Other Ambulatory Visit: Payer: Self-pay

## 2019-08-03 DIAGNOSIS — Z20822 Contact with and (suspected) exposure to covid-19: Secondary | ICD-10-CM | POA: Insufficient documentation

## 2019-08-03 DIAGNOSIS — Z01812 Encounter for preprocedural laboratory examination: Secondary | ICD-10-CM | POA: Diagnosis not present

## 2019-08-03 LAB — CBC
HCT: 44.2 % (ref 36.0–46.0)
Hemoglobin: 14.8 g/dL (ref 12.0–15.0)
MCH: 31.2 pg (ref 26.0–34.0)
MCHC: 33.5 g/dL (ref 30.0–36.0)
MCV: 93.1 fL (ref 80.0–100.0)
Platelets: 327 10*3/uL (ref 150–400)
RBC: 4.75 MIL/uL (ref 3.87–5.11)
RDW: 13.6 % (ref 11.5–15.5)
WBC: 6.5 10*3/uL (ref 4.0–10.5)
nRBC: 0 % (ref 0.0–0.2)

## 2019-08-03 LAB — SARS CORONAVIRUS 2 (TAT 6-24 HRS): SARS Coronavirus 2: NEGATIVE

## 2019-08-03 LAB — TYPE AND SCREEN
ABO/RH(D): O NEG
Antibody Screen: NEGATIVE

## 2019-08-05 ENCOUNTER — Encounter
Admission: RE | Disposition: A | Discharge status: Home / Self Care | Payer: Self-pay | Source: Ambulatory Visit | Attending: Obstetrics and Gynecology

## 2019-08-05 ENCOUNTER — Ambulatory Visit: Payer: BC Managed Care – PPO | Admitting: Certified Registered"

## 2019-08-05 ENCOUNTER — Encounter: Payer: Self-pay | Admitting: Obstetrics and Gynecology

## 2019-08-05 ENCOUNTER — Ambulatory Visit
Admission: RE | Admit: 2019-08-05 | Discharge: 2019-08-05 | Disposition: A | Discharge status: Home / Self Care | Payer: BC Managed Care – PPO | Source: Ambulatory Visit | Attending: Obstetrics and Gynecology | Admitting: Obstetrics and Gynecology

## 2019-08-05 ENCOUNTER — Other Ambulatory Visit: Payer: Self-pay

## 2019-08-05 DIAGNOSIS — Z803 Family history of malignant neoplasm of breast: Secondary | ICD-10-CM | POA: Diagnosis not present

## 2019-08-05 DIAGNOSIS — Z853 Personal history of malignant neoplasm of breast: Secondary | ICD-10-CM | POA: Insufficient documentation

## 2019-08-05 DIAGNOSIS — N801 Endometriosis of ovary: Secondary | ICD-10-CM | POA: Insufficient documentation

## 2019-08-05 DIAGNOSIS — Z1509 Genetic susceptibility to other malignant neoplasm: Secondary | ICD-10-CM

## 2019-08-05 DIAGNOSIS — N840 Polyp of corpus uteri: Secondary | ICD-10-CM | POA: Diagnosis not present

## 2019-08-05 DIAGNOSIS — N888 Other specified noninflammatory disorders of cervix uteri: Secondary | ICD-10-CM | POA: Insufficient documentation

## 2019-08-05 DIAGNOSIS — I10 Essential (primary) hypertension: Secondary | ICD-10-CM | POA: Insufficient documentation

## 2019-08-05 DIAGNOSIS — Z148 Genetic carrier of other disease: Secondary | ICD-10-CM

## 2019-08-05 DIAGNOSIS — Z1501 Genetic susceptibility to malignant neoplasm of breast: Secondary | ICD-10-CM | POA: Diagnosis not present

## 2019-08-05 DIAGNOSIS — Z800 Family history of malignant neoplasm of digestive organs: Secondary | ICD-10-CM | POA: Insufficient documentation

## 2019-08-05 DIAGNOSIS — F17210 Nicotine dependence, cigarettes, uncomplicated: Secondary | ICD-10-CM | POA: Diagnosis not present

## 2019-08-05 DIAGNOSIS — Z801 Family history of malignant neoplasm of trachea, bronchus and lung: Secondary | ICD-10-CM | POA: Diagnosis not present

## 2019-08-05 DIAGNOSIS — Z808 Family history of malignant neoplasm of other organs or systems: Secondary | ICD-10-CM | POA: Diagnosis not present

## 2019-08-05 DIAGNOSIS — Z1502 Genetic susceptibility to malignant neoplasm of ovary: Secondary | ICD-10-CM

## 2019-08-05 DIAGNOSIS — F329 Major depressive disorder, single episode, unspecified: Secondary | ICD-10-CM | POA: Insufficient documentation

## 2019-08-05 HISTORY — PX: TOTAL LAPAROSCOPIC HYSTERECTOMY WITH BILATERAL SALPINGO OOPHORECTOMY: SHX6845

## 2019-08-05 HISTORY — PX: CYSTOSCOPY: SHX5120

## 2019-08-05 LAB — ABO/RH: ABO/RH(D): O NEG

## 2019-08-05 SURGERY — HYSTERECTOMY, TOTAL, LAPAROSCOPIC, WITH BILATERAL SALPINGO-OOPHORECTOMY
Anesthesia: General

## 2019-08-05 MED ORDER — OXYCODONE HCL 5 MG PO TABS
ORAL_TABLET | ORAL | Status: AC
  Filled 2019-08-05: qty 1

## 2019-08-05 MED ORDER — LABETALOL HCL 5 MG/ML IV SOLN
INTRAVENOUS | Status: AC
  Filled 2019-08-05: qty 4

## 2019-08-05 MED ORDER — IBUPROFEN 600 MG PO TABS
600.0000 mg | ORAL_TABLET | Freq: Four times a day (QID) | ORAL | Status: DC | PRN
  Administered 2019-08-05: 600 mg via ORAL
  Filled 2019-08-05: qty 1

## 2019-08-05 MED ORDER — LACTATED RINGERS IV SOLN: INTRAVENOUS | Status: DC

## 2019-08-05 MED ORDER — SUCCINYLCHOLINE CHLORIDE 20 MG/ML IJ SOLN
INTRAMUSCULAR | Status: DC | PRN
  Administered 2019-08-05: 100 mg via INTRAVENOUS

## 2019-08-05 MED ORDER — SUCCINYLCHOLINE CHLORIDE 200 MG/10ML IV SOSY
PREFILLED_SYRINGE | INTRAVENOUS | Status: AC
  Filled 2019-08-05: qty 10

## 2019-08-05 MED ORDER — ACETAMINOPHEN 500 MG PO TABS: 1000.0000 mg | ORAL_TABLET | Freq: Four times a day (QID) | ORAL | 0 refills | Status: AC | PRN

## 2019-08-05 MED ORDER — MIDAZOLAM HCL 2 MG/2ML IJ SOLN
INTRAMUSCULAR | Status: DC | PRN
  Administered 2019-08-05: 2 mg via INTRAVENOUS

## 2019-08-05 MED ORDER — LIDOCAINE HCL (CARDIAC) PF 100 MG/5ML IV SOSY
PREFILLED_SYRINGE | INTRAVENOUS | Status: DC | PRN
  Administered 2019-08-05: 80 mg via INTRAVENOUS

## 2019-08-05 MED ORDER — DEXAMETHASONE SODIUM PHOSPHATE 10 MG/ML IJ SOLN
INTRAMUSCULAR | Status: AC
  Filled 2019-08-05: qty 1

## 2019-08-05 MED ORDER — PROPOFOL 10 MG/ML IV BOLUS
INTRAVENOUS | Status: DC | PRN
  Administered 2019-08-05: 150 mg via INTRAVENOUS

## 2019-08-05 MED ORDER — BUPIVACAINE HCL (PF) 0.5 % IJ SOLN
INTRAMUSCULAR | Status: AC
  Filled 2019-08-05: qty 30

## 2019-08-05 MED ORDER — SUGAMMADEX SODIUM 200 MG/2ML IV SOLN
INTRAVENOUS | Status: DC | PRN
  Administered 2019-08-05: 200 mg via INTRAVENOUS

## 2019-08-05 MED ORDER — CEFAZOLIN SODIUM-DEXTROSE 2-4 GM/100ML-% IV SOLN
2.0000 g | INTRAVENOUS | Status: AC
  Administered 2019-08-05: 2 g via INTRAVENOUS

## 2019-08-05 MED ORDER — ONDANSETRON HCL 4 MG/2ML IJ SOLN
INTRAMUSCULAR | Status: AC
  Filled 2019-08-05: qty 2

## 2019-08-05 MED ORDER — PROMETHAZINE HCL 25 MG/ML IJ SOLN
6.2500 mg | INTRAMUSCULAR | Status: DC | PRN
  Administered 2019-08-05: 6.25 mg via INTRAVENOUS

## 2019-08-05 MED ORDER — LACTATED RINGERS IV SOLN
10.0000 mL/h | INTRAVENOUS | Status: DC
  Administered 2019-08-05: 10 mL/h via INTRAVENOUS

## 2019-08-05 MED ORDER — OXYCODONE HCL 5 MG PO TABS: 5.0000 mg | ORAL_TABLET | Freq: Four times a day (QID) | ORAL | 0 refills | Status: AC | PRN

## 2019-08-05 MED ORDER — ROCURONIUM BROMIDE 10 MG/ML (PF) SYRINGE
PREFILLED_SYRINGE | INTRAVENOUS | Status: AC
  Filled 2019-08-05: qty 10

## 2019-08-05 MED ORDER — LIDOCAINE HCL (PF) 2 % IJ SOLN
INTRAMUSCULAR | Status: AC
  Filled 2019-08-05: qty 5

## 2019-08-05 MED ORDER — LABETALOL HCL 5 MG/ML IV SOLN
INTRAVENOUS | Status: DC | PRN
  Administered 2019-08-05: 5 mg via INTRAVENOUS

## 2019-08-05 MED ORDER — FENTANYL CITRATE (PF) 100 MCG/2ML IJ SOLN
INTRAMUSCULAR | Status: AC
  Filled 2019-08-05: qty 2

## 2019-08-05 MED ORDER — OXYCODONE HCL 5 MG PO TABS
5.0000 mg | ORAL_TABLET | Freq: Four times a day (QID) | ORAL | Status: DC | PRN
  Administered 2019-08-05: 5 mg via ORAL

## 2019-08-05 MED ORDER — BUPIVACAINE LIPOSOME 1.3 % IJ SUSP
INTRAMUSCULAR | Status: DC | PRN
  Administered 2019-08-05: 20 mL

## 2019-08-05 MED ORDER — IBUPROFEN 600 MG PO TABS: 600.0000 mg | ORAL_TABLET | Freq: Four times a day (QID) | ORAL | 3 refills | Status: DC | PRN

## 2019-08-05 MED ORDER — FENTANYL CITRATE (PF) 100 MCG/2ML IJ SOLN
INTRAMUSCULAR | Status: DC | PRN
  Administered 2019-08-05 (×2): 100 ug via INTRAVENOUS

## 2019-08-05 MED ORDER — PROPOFOL 10 MG/ML IV BOLUS
INTRAVENOUS | Status: AC
  Filled 2019-08-05: qty 20

## 2019-08-05 MED ORDER — PROMETHAZINE HCL 25 MG/ML IJ SOLN
INTRAMUSCULAR | Status: AC
  Filled 2019-08-05: qty 1

## 2019-08-05 MED ORDER — MIDAZOLAM HCL 2 MG/2ML IJ SOLN
INTRAMUSCULAR | Status: AC
  Filled 2019-08-05: qty 2

## 2019-08-05 MED ORDER — HYDRALAZINE HCL 20 MG/ML IJ SOLN
INTRAMUSCULAR | Status: AC
  Filled 2019-08-05: qty 1

## 2019-08-05 MED ORDER — IBUPROFEN 800 MG PO TABS
ORAL_TABLET | ORAL | Status: AC
  Filled 2019-08-05: qty 1

## 2019-08-05 MED ORDER — ROCURONIUM BROMIDE 100 MG/10ML IV SOLN
INTRAVENOUS | Status: DC | PRN
  Administered 2019-08-05: 5 mg via INTRAVENOUS
  Administered 2019-08-05: 40 mg via INTRAVENOUS

## 2019-08-05 MED ORDER — FENTANYL CITRATE (PF) 250 MCG/5ML IJ SOLN
INTRAMUSCULAR | Status: AC
  Filled 2019-08-05: qty 5

## 2019-08-05 MED ORDER — CEFAZOLIN SODIUM-DEXTROSE 2-4 GM/100ML-% IV SOLN
INTRAVENOUS | Status: AC
  Filled 2019-08-05: qty 100

## 2019-08-05 MED ORDER — DEXAMETHASONE SODIUM PHOSPHATE 10 MG/ML IJ SOLN
INTRAMUSCULAR | Status: DC | PRN
  Administered 2019-08-05: 5 mg via INTRAVENOUS

## 2019-08-05 MED ORDER — BUPIVACAINE LIPOSOME 1.3 % IJ SUSP
INTRAMUSCULAR | Status: AC
  Filled 2019-08-05: qty 20

## 2019-08-05 MED ORDER — ONDANSETRON HCL 4 MG/2ML IJ SOLN
INTRAMUSCULAR | Status: DC | PRN
  Administered 2019-08-05: 4 mg via INTRAVENOUS

## 2019-08-05 MED ORDER — FENTANYL CITRATE (PF) 100 MCG/2ML IJ SOLN
25.0000 ug | INTRAMUSCULAR | Status: DC | PRN
  Administered 2019-08-05 (×2): 50 ug via INTRAVENOUS

## 2019-08-05 MED ORDER — HYDRALAZINE HCL 20 MG/ML IJ SOLN
10.0000 mg | Freq: Once | INTRAMUSCULAR | Status: AC
  Administered 2019-08-05: 10 mg via INTRAVENOUS
  Filled 2019-08-05: qty 0.5

## 2019-08-05 SURGICAL SUPPLY — 50 items
BAG URINE DRAIN 2000ML AR STRL (UROLOGICAL SUPPLIES) ×3 IMPLANT
BLADE SURG SZ11 CARB STEEL (BLADE) ×3 IMPLANT
CANISTER SUCT 1200ML W/VALVE (MISCELLANEOUS) ×3 IMPLANT
CATH FOLEY 2WAY  5CC 16FR (CATHETERS) ×1
CATH URTH 16FR FL 2W BLN LF (CATHETERS) ×2 IMPLANT
CHLORAPREP W/TINT 26 (MISCELLANEOUS) ×3 IMPLANT
DEFOGGER SCOPE WARMER CLEARIFY (MISCELLANEOUS) ×3 IMPLANT
DERMABOND ADVANCED (GAUZE/BANDAGES/DRESSINGS) ×1
DERMABOND ADVANCED .7 DNX12 (GAUZE/BANDAGES/DRESSINGS) ×2 IMPLANT
DEVICE SUTURE ENDOST 10MM (ENDOMECHANICALS) IMPLANT
DRAPE CAMERA CLOSED 9X96 (DRAPES) IMPLANT
DRSG TEGADERM 2X2.25 PEDS (GAUZE/BANDAGES/DRESSINGS) ×12 IMPLANT
GAUZE 4X4 16PLY RFD (DISPOSABLE) ×3 IMPLANT
GLOVE BIOGEL PI IND STRL 6.5 (GLOVE) ×2 IMPLANT
GLOVE BIOGEL PI INDICATOR 6.5 (GLOVE) ×1
GLOVE SURG SYN 6.5 ES PF (GLOVE) ×6 IMPLANT
GOWN STRL REUS W/ TWL LRG LVL3 (GOWN DISPOSABLE) ×6 IMPLANT
GOWN STRL REUS W/TWL LRG LVL3 (GOWN DISPOSABLE) ×3
GRASPER SUT TROCAR 14GX15 (MISCELLANEOUS) IMPLANT
IRRIGATION STRYKERFLOW (MISCELLANEOUS) IMPLANT
IRRIGATOR STRYKERFLOW (MISCELLANEOUS)
IV LACTATED RINGERS 1000ML (IV SOLUTION) ×6 IMPLANT
KIT PINK PAD W/HEAD ARE REST (MISCELLANEOUS) ×3
KIT PINK PAD W/HEAD ARM REST (MISCELLANEOUS) ×2 IMPLANT
KIT TURNOVER CYSTO (KITS) ×6 IMPLANT
MANIPULATOR VCARE LG CRV RETR (MISCELLANEOUS) IMPLANT
MANIPULATOR VCARE SML CRV RETR (MISCELLANEOUS) IMPLANT
MANIPULATOR VCARE STD CRV RETR (MISCELLANEOUS) ×3 IMPLANT
NS IRRIG 1000ML POUR BTL (IV SOLUTION) ×3 IMPLANT
NS IRRIG 500ML POUR BTL (IV SOLUTION) ×3 IMPLANT
OCCLUDER COLPOPNEUMO (BALLOONS) ×3 IMPLANT
PACK GYN LAPAROSCOPIC (MISCELLANEOUS) ×3 IMPLANT
PAD OB MATERNITY 4.3X12.25 (PERSONAL CARE ITEMS) ×3 IMPLANT
PAD PREP 24X41 OB/GYN DISP (PERSONAL CARE ITEMS) ×3 IMPLANT
SCISSORS METZENBAUM CVD 33 (INSTRUMENTS) IMPLANT
SET CYSTO W/LG BORE CLAMP LF (SET/KITS/TRAYS/PACK) ×3 IMPLANT
SET TRI-LUMEN FLTR TB AIRSEAL (TUBING) ×3 IMPLANT
SHEARS HARMONIC ACE PLUS 36CM (ENDOMECHANICALS) ×3 IMPLANT
SLEEVE ENDOPATH XCEL 5M (ENDOMECHANICALS) ×3 IMPLANT
SPONGE GAUZE 2X2 8PLY STRL LF (GAUZE/BANDAGES/DRESSINGS) ×12 IMPLANT
SURGILUBE 2OZ TUBE FLIPTOP (MISCELLANEOUS) ×3 IMPLANT
SUT ENDO VLOC 180-0-8IN (SUTURE) IMPLANT
SUT MNCRL 4-0 (SUTURE) ×1
SUT MNCRL 4-0 27XMFL (SUTURE) ×2
SUT VIC AB 0 CT1 36 (SUTURE) ×3 IMPLANT
SUTURE MNCRL 4-0 27XMF (SUTURE) ×2 IMPLANT
SYR 10ML LL (SYRINGE) ×3 IMPLANT
SYR 50ML LL SCALE MARK (SYRINGE) ×3 IMPLANT
TROCAR PORT AIRSEAL 8X100 (TROCAR) ×3 IMPLANT
TROCAR XCEL NON-BLD 5MMX100MML (ENDOMECHANICALS) ×3 IMPLANT

## 2019-08-05 NOTE — Transfer of Care (Signed)
Immediate Anesthesia Transfer of Care Note  Patient: Cheryl Mooney  Procedure(s) Performed: TOTAL LAPAROSCOPIC HYSTERECTOMY WITH BILATERAL SALPINGO OOPHORECTOMY (Bilateral ) CYSTOSCOPY (N/A )  Patient Location: PACU  Anesthesia Type:General  Level of Consciousness: awake and alert   Airway & Oxygen Therapy: Patient Spontanous Breathing and Patient connected to nasal cannula oxygen  Post-op Assessment: Report given to RN and Post -op Vital signs reviewed and stable  Post vital signs: Reviewed and stable  Last Vitals:  Vitals Value Taken Time  BP 175/88 08/05/19 0932  Temp 36.6 C 08/05/19 0932  Pulse 70 08/05/19 0937  Resp 16 08/05/19 0937  SpO2 100 % 08/05/19 0937  Vitals shown include unvalidated device data.  Last Pain:  Vitals:   08/05/19 0932  TempSrc:   PainSc: Asleep         Complications: No apparent anesthesia complications

## 2019-08-05 NOTE — H&P (View-Only) (Signed)
Cheryl Mooney is an 54 y.o. female.   Chief Complaint: BRCA2 +  HPI: Cheryl Mooney was diagnosed breast cancer in August of 2020. 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 Right axilla lymph node negative for malignancy. ER> 90% positive, PR11-50% positive, HER-2 equivocal, 2+ by IHC.  FISH negative. She was also diagnosed with hereditary BRCA 2. She has completed initial treatment for her breast cancer and presents today for a preoperative visit regarding removal of her bilateral ovaries, fallopian tubes and total hysterectomy. She is feeling well.   Past Medical History:  Diagnosis Date  . Anemia    things seem back to normal  . Benign neoplasm of sigmoid colon   . Benign neoplasm of transverse colon   . Breast cancer ([place])   . Depression   . Family history of BRCA2 gene positive   . Family history of breast cancer   . Family history of colon cancer   . Family history of lung cancer   . Family history of pancreatic cancer   . GERD (gastroesophageal reflux disease)   . History of kidney stones   . Hypertension   . Malignant neoplasm of lower-outer quadrant of right breast of female, estrogen receptor positive ([place]) 12/17/2018  . Smoker     Past Surgical History:  Procedure Laterality Date  . AXILLARY SENTINEL NODE BIOPSY Bilateral 01/14/2019   Procedure: AXILLARY SENTINEL NODE BIOPSY;  Surgeon: Herbert Pun, MD;  Location: [facility];  Service: General;  Laterality: Bilateral;  . BREAST BIOPSY Right 12/08/2018   [place] bx of mass with calcs path pending  . BREAST BIOPSY Right 12/08/2018   [place] bx of LN, path pending  . BREAST RECONSTRUCTION WITH PLACEMENT OF TISSUE EXPANDER AND ALLODERM Bilateral 01/14/2019   Procedure: BILATERAL BREAST RECONSTRUCTION WITH PLACEMENT OF TISSUE EXPANDER AND FLEX HD;  Surgeon: Wallace Going, DO;  Location: [facility];  Service: Plastics;  Laterality: Bilateral;  .  COLONOSCOPY WITH PROPOFOL N/A 02/17/2015   Procedure: COLONOSCOPY WITH PROPOFOL;  Surgeon: Lucilla Lame, MD;  Location: [place];  Service: Endoscopy;  Laterality: N/A;  . COLONOSCOPY WITH PROPOFOL N/A 07/12/2019   Procedure: COLONOSCOPY WITH BIOPSY;  Surgeon: Lucilla Lame, MD;  Location: [place];  Service: Endoscopy;  Laterality: N/A;  priority 3 PORT A CATH NEEDS ACCESSED  . POLYPECTOMY  02/17/2015   Procedure: POLYPECTOMY;  Surgeon: Lucilla Lame, MD;  Location: [place];  Service: Endoscopy;;  . POLYPECTOMY N/A 07/12/2019   Procedure: POLYPECTOMY;  Surgeon: Lucilla Lame, MD;  Location: [facility];  Service: Endoscopy;  Laterality: N/A;  . PORTACATH PLACEMENT Left 02/18/2019   Procedure: INSERTION PORT-A-CATH;  Surgeon: Herbert Pun, MD;  Location: [facility];  Service: General;  Laterality: Left;  . SIMPLE MASTECTOMY WITH AXILLARY SENTINEL NODE BIOPSY Left 01/14/2019   Procedure: LEFT SIMPLE MASTECTOMY;  Surgeon: Herbert Pun, MD;  Location: [facility];  Service: General;  Laterality: Left;  . TOTAL MASTECTOMY Right 01/14/2019   Procedure: RIGHT TOTAL MASTECTOMY;  Surgeon: Herbert Pun, MD;  Location: [facility];  Service: General;  Laterality: Right;  . WISDOM TOOTH EXTRACTION      Family History  Problem Relation Age of Onset  . Colon cancer Mother 19  . Breast cancer Mother 62       "BRCA2+"  . Hypertension Father   . Stroke Father   . Alzheimer's disease Father   . Prostate cancer Father   . Breast cancer Maternal  Aunt        mat great aunt  . Breast cancer Maternal Grandmother 68  . Pancreatic cancer Maternal Grandfather 33   Social History:  reports that she has been smoking cigarettes. She has a 1.00 pack-year smoking history. She has never used smokeless tobacco. She reports current alcohol use of about 2.0 standard drinks of alcohol per week. She reports that she does not use drugs.  Allergies: No Known  Allergies  (Not in a hospital admission)   No results found for this or any previous visit (from the past 48 hour(s)). No results found.  Review of Systems  Constitutional: Negative for chills and fever.  HENT: Negative for congestion, hearing loss and sinus pain.   Respiratory: Negative for cough, shortness of breath and wheezing.   Cardiovascular: Negative for chest pain, palpitations and leg swelling.  Gastrointestinal: Negative for abdominal pain, constipation, diarrhea, nausea and vomiting.  Genitourinary: Negative for dysuria, flank pain, frequency, hematuria and urgency.  Musculoskeletal: Negative for back pain.  Skin: Negative for rash.  Neurological: Negative for dizziness and headaches.  Psychiatric/Behavioral: Negative for suicidal ideas. The patient is not nervous/anxious.     Blood pressure 136/88, height 5' 5"  (1.651 m), weight 158 lb (71.7 kg). Physical Exam  Nursing note and vitals reviewed. Constitutional: She is oriented to person, place, and time. She appears well-developed and well-nourished.  HENT:  Head: Normocephalic and atraumatic.  Cardiovascular: Normal rate and regular rhythm.  Respiratory: Effort normal and breath sounds normal.  GI: Soft. Bowel sounds are normal. She exhibits no distension. There is no abdominal tenderness.  Musculoskeletal:        General: Normal range of motion.  Neurological: She is alert and oriented to person, place, and time.  Skin: Skin is warm and dry.  Psychiatric: She has a normal mood and affect. Her behavior is normal. Judgment and thought content normal.     Assessment/Plan 54 yo G0P0000  With hereditary BRCA 2+ here for properative visit.   I have had a careful discussion with this patient about all the options available and the risk/benefits of each.She understands that removal of her ovarian and fallopian tubes will result in about a 80% reduction in risk for ovarian and peritoneal cancer. She understands to still  monitor herself for symptoms of ovarian cancer.  I have fully informed this patient that a hysterectomy may subject her to a variety of discomforts and risks: She understands that most patients have surgery with little difficulty, but problems can happen ranging from minor to fatal. These include nausea, vomiting, pain, bleeding, infection, poor healing, hernia, wound separation, vaginal cuff separation or formation of adhesions. Unexpected reactions may occur from any drug or anesthetic given. Unintended injury may occur to other pelvic or abdominal structures such as Fallopian tubes, ovaries, bladder, ureter (tube from kidney to bladder), or bowel. Nerves going from the pelvis to the legs may be injured. Any such injury may require immediate or later additional surgery to correct the problem. Excessive blood loss requiring transfusion is very unlikely but possible. Dangerous blood clots may form in the legs or lungs. Physical and sexual activity will be restricted in varying degrees for an indeterminate period of time but most often 2-4 weeks. She understands that the plan is to do this laparoscopically, however, there is a chance that this will need to be performed via a larger incision. She may be hospitalized overnight. Finally, she understands that it is impossible to list every possible undesirable effect  and that the condition for which surgery is done is not always cured or significantly improved, and in rare cases may be even worse. I have also counseled her extensively about the pros and cons of ovarian conservation versus removal. Ample time was given to answer all questions.   More than 40 minutes were spent face to face with the patient in the room, reviewing the medical record, labs and images, and coordinating care for the patient. The plan of management was discussed in detail and counseling was provided.    Homero Fellers, MD 08/05/2019, 6:14 AM

## 2019-08-05 NOTE — Progress Notes (Signed)
Cheryl Mooney is an 54 y.o. female.   Chief Complaint: BRCA2 +  HPI: Cheryl Mooney was diagnosed breast cancer in August of 2020. 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 Right axilla lymph node negative for malignancy. ER> 90% positive, PR11-50% positive, HER-2 equivocal, 2+ by IHC.  FISH negative. She was also diagnosed with hereditary BRCA 2. She has completed initial treatment for her breast cancer and presents today for a preoperative visit regarding removal of her bilateral ovaries, fallopian tubes and total hysterectomy. She is feeling well.   Past Medical History:  Diagnosis Date  . Anemia    things seem back to normal  . Benign neoplasm of sigmoid colon   . Benign neoplasm of transverse colon   . Breast cancer ([place])   . Depression   . Family history of BRCA2 gene positive   . Family history of breast cancer   . Family history of colon cancer   . Family history of lung cancer   . Family history of pancreatic cancer   . GERD (gastroesophageal reflux disease)   . History of kidney stones   . Hypertension   . Malignant neoplasm of lower-outer quadrant of right breast of female, estrogen receptor positive ([place]) 12/17/2018  . Smoker     Past Surgical History:  Procedure Laterality Date  . AXILLARY SENTINEL NODE BIOPSY Bilateral 01/14/2019   Procedure: AXILLARY SENTINEL NODE BIOPSY;  Surgeon: Herbert Pun, MD;  Location: [facility];  Service: General;  Laterality: Bilateral;  . BREAST BIOPSY Right 12/08/2018   [place] bx of mass with calcs path pending  . BREAST BIOPSY Right 12/08/2018   [place] bx of LN, path pending  . BREAST RECONSTRUCTION WITH PLACEMENT OF TISSUE EXPANDER AND ALLODERM Bilateral 01/14/2019   Procedure: BILATERAL BREAST RECONSTRUCTION WITH PLACEMENT OF TISSUE EXPANDER AND FLEX HD;  Surgeon: Wallace Going, DO;  Location: [facility];  Service: Plastics;  Laterality: Bilateral;  .  COLONOSCOPY WITH PROPOFOL N/A 02/17/2015   Procedure: COLONOSCOPY WITH PROPOFOL;  Surgeon: Lucilla Lame, MD;  Location: [place];  Service: Endoscopy;  Laterality: N/A;  . COLONOSCOPY WITH PROPOFOL N/A 07/12/2019   Procedure: COLONOSCOPY WITH BIOPSY;  Surgeon: Lucilla Lame, MD;  Location: [place];  Service: Endoscopy;  Laterality: N/A;  priority 3 PORT A CATH NEEDS ACCESSED  . POLYPECTOMY  02/17/2015   Procedure: POLYPECTOMY;  Surgeon: Lucilla Lame, MD;  Location: [place];  Service: Endoscopy;;  . POLYPECTOMY N/A 07/12/2019   Procedure: POLYPECTOMY;  Surgeon: Lucilla Lame, MD;  Location: [place];  Service: Endoscopy;  Laterality: N/A;  . PORTACATH PLACEMENT Left 02/18/2019   Procedure: INSERTION PORT-A-CATH;  Surgeon: Herbert Pun, MD;  Location: [facility];  Service: General;  Laterality: Left;  . SIMPLE MASTECTOMY WITH AXILLARY SENTINEL NODE BIOPSY Left 01/14/2019   Procedure: LEFT SIMPLE MASTECTOMY;  Surgeon: Herbert Pun, MD;  Location: [facility];  Service: General;  Laterality: Left;  . TOTAL MASTECTOMY Right 01/14/2019   Procedure: RIGHT TOTAL MASTECTOMY;  Surgeon: Herbert Pun, MD;  Location: [facility];  Service: General;  Laterality: Right;  . WISDOM TOOTH EXTRACTION      Family History  Problem Relation Age of Onset  . Colon cancer Mother 19  . Breast cancer Mother 62       "BRCA2+"  . Hypertension Father   . Stroke Father   . Alzheimer's disease Father   . Prostate cancer Father   . Breast cancer Maternal  Aunt        mat great aunt  . Breast cancer Maternal Grandmother 68  . Pancreatic cancer Maternal Grandfather 33   Social History:  reports that she has been smoking cigarettes. She has a 1.00 pack-year smoking history. She has never used smokeless tobacco. She reports current alcohol use of about 2.0 standard drinks of alcohol per week. She reports that she does not use drugs.  Allergies: No Known  Allergies  (Not in a hospital admission)   No results found for this or any previous visit (from the past 48 hour(s)). No results found.  Review of Systems  Constitutional: Negative for chills and fever.  HENT: Negative for congestion, hearing loss and sinus pain.   Respiratory: Negative for cough, shortness of breath and wheezing.   Cardiovascular: Negative for chest pain, palpitations and leg swelling.  Gastrointestinal: Negative for abdominal pain, constipation, diarrhea, nausea and vomiting.  Genitourinary: Negative for dysuria, flank pain, frequency, hematuria and urgency.  Musculoskeletal: Negative for back pain.  Skin: Negative for rash.  Neurological: Negative for dizziness and headaches.  Psychiatric/Behavioral: Negative for suicidal ideas. The patient is not nervous/anxious.     Blood pressure 136/88, height 5' 5"  (1.651 m), weight 158 lb (71.7 kg). Physical Exam  Nursing note and vitals reviewed. Constitutional: She is oriented to person, place, and time. She appears well-developed and well-nourished.  HENT:  Head: Normocephalic and atraumatic.  Cardiovascular: Normal rate and regular rhythm.  Respiratory: Effort normal and breath sounds normal.  GI: Soft. Bowel sounds are normal. She exhibits no distension. There is no abdominal tenderness.  Musculoskeletal:        General: Normal range of motion.  Neurological: She is alert and oriented to person, place, and time.  Skin: Skin is warm and dry.  Psychiatric: She has a normal mood and affect. Her behavior is normal. Judgment and thought content normal.     Assessment/Plan 54 yo G0P0000  With hereditary BRCA 2+ here for properative visit.   I have had a careful discussion with this patient about all the options available and the risk/benefits of each.She understands that removal of her ovarian and fallopian tubes will result in about a 80% reduction in risk for ovarian and peritoneal cancer. She understands to still  monitor herself for symptoms of ovarian cancer.  I have fully informed this patient that a hysterectomy may subject her to a variety of discomforts and risks: She understands that most patients have surgery with little difficulty, but problems can happen ranging from minor to fatal. These include nausea, vomiting, pain, bleeding, infection, poor healing, hernia, wound separation, vaginal cuff separation or formation of adhesions. Unexpected reactions may occur from any drug or anesthetic given. Unintended injury may occur to other pelvic or abdominal structures such as Fallopian tubes, ovaries, bladder, ureter (tube from kidney to bladder), or bowel. Nerves going from the pelvis to the legs may be injured. Any such injury may require immediate or later additional surgery to correct the problem. Excessive blood loss requiring transfusion is very unlikely but possible. Dangerous blood clots may form in the legs or lungs. Physical and sexual activity will be restricted in varying degrees for an indeterminate period of time but most often 2-4 weeks. She understands that the plan is to do this laparoscopically, however, there is a chance that this will need to be performed via a larger incision. She may be hospitalized overnight. Finally, she understands that it is impossible to list every possible undesirable effect  and that the condition for which surgery is done is not always cured or significantly improved, and in rare cases may be even worse. I have also counseled her extensively about the pros and cons of ovarian conservation versus removal. Ample time was given to answer all questions.   More than 40 minutes were spent face to face with the patient in the room, reviewing the medical record, labs and images, and coordinating care for the patient. The plan of management was discussed in detail and counseling was provided.    Homero Fellers, MD 08/05/2019, 6:14 AM

## 2019-08-05 NOTE — Discharge Instructions (Signed)
AMBULATORY SURGERY  DISCHARGE INSTRUCTIONS   1) The drugs that you were given will stay in your system until tomorrow so for the next 24 hours you should not:  A) Drive an automobile B) Make any legal decisions C) Drink any alcoholic beverage   2) You may resume regular meals tomorrow.  Today it is better to start with liquids and gradually work up to solid foods.  You may eat anything you prefer, but it is better to start with liquids, then soup and crackers, and gradually work up to solid foods.   3) Please notify your doctor immediately if you have any unusual bleeding, trouble breathing, redness and pain at the surgery site, drainage, fever, or pain not relieved by medication.    4) Additional Instructions:        Please contact your physician with any problems or Same Day Surgery at [phone], Monday through Friday 6 am to 4 pm, or [place] at [facility] number at [phone].Laparoscopically Assisted Vaginal Hysterectomy, Care After This sheet gives you information about how to care for yourself after your procedure. Your health care provider may also give you more specific instructions. If you have problems or questions, contact your health care provider. What can I expect after the procedure? After the procedure, it is common to have:  Soreness and numbness in your incision areas.  Abdominal pain. You will be given pain medicine to control it.  Vaginal bleeding and discharge. You will need to use a sanitary napkin after this procedure.  Sore throat from the breathing tube that was inserted during surgery. Follow these instructions at home: Medicines  Take over-the-counter and prescription medicines only as told by your health care provider.  Do not take aspirin or ibuprofen. These medicines can cause bleeding.  Do not drive or use heavy machinery while taking prescription pain medicine.  Do not drive for 24 hours if you were given a medicine to help  you relax (sedative) during the procedure. Incision care   Follow instructions from your health care provider about how to take care of your incisions. Make sure you: ? Wash your hands with soap and water before you change your bandage (dressing). If soap and water are not available, use hand sanitizer. ? Change your dressing as told by your health care provider. ? Leave stitches (sutures), skin glue, or adhesive strips in place. These skin closures may need to stay in place for 2 weeks or longer. If adhesive strip edges start to loosen and curl up, you may trim the loose edges. Do not remove adhesive strips completely unless your health care provider tells you to do that.  Check your incision area every day for signs of infection. Check for: ? Redness, swelling, or pain. ? Fluid or blood. ? Warmth. ? Pus or a bad smell. Activity  Get regular exercise as told by your health care provider. You may be told to take short walks every day and go farther each time.  Return to your normal activities as told by your health care provider. Ask your health care provider what activities are safe for you.  Do not douche, use tampons, or have sexual intercourse for at least 12 weeks, or until your health care provider gives you permission.  Do not lift anything that is heavier than 10 lb (4.5 kg), or the limit that your health care provider tells you, until he or she says that it is safe. General instructions  Do not take baths, swim,  or use a hot tub until your health care provider approves. Take showers instead of baths.  Do not drive for 24 hours if you received a sedative.  Do not drive or operate heavy machinery while taking prescription pain medicine.  To prevent or treat constipation while you are taking prescription pain medicine, your health care provider may recommend that you: ? Drink enough fluid to keep your urine clear or pale yellow. ? Take over-the-counter or prescription  medicines. ? Eat foods that are high in fiber, such as fresh fruits and vegetables, whole grains, and beans. ? Limit foods that are high in fat and processed sugars, such as fried and sweet foods.  Keep all follow-up visits as told by your health care provider. This is important. Contact a health care provider if:  You have signs of infection, such as: ? Redness, swelling, or pain around your incision sites. ? Fluid or blood coming from an incision. ? An incision that feels warm to the touch. ? Pus or a bad smell coming from an incision.  Your incision breaks open.  Your pain medicine is not helping.  You feel dizzy or light-headed.  You have pain or bleeding when you urinate.  You have persistent nausea and vomiting.  You have blood, pus, or a bad-smelling discharge from your vagina. Get help right away if:  You have a fever.  You have severe abdominal pain.  You have chest pain.  You have shortness of breath.  You faint.  You have pain, swelling, or redness in your leg.  You have heavy bleeding from your vagina. Summary  After the procedure, it is common to have abdominal pain and vaginal bleeding.  You should not drive or lift heavy objects until your health care provider says that it is safe.  Contact your health care provider if you have any symptoms of infection, excessive vaginal bleeding, nausea, vomiting, or shortness of breath. This information is not intended to replace advice given to you by your health care provider. Make sure you discuss any questions you have with your health care provider. Document Revised: 03/07/2017 Document Reviewed: 05/21/2016 Elsevier Patient Education  2020 [employer].

## 2019-08-05 NOTE — Op Note (Addendum)
Operative Report:  PRE-OP DIAGNOSIS: BRCA 2+   POST-OP DIAGNOSIS: BRCA 2+   PROCEDURE: Procedure(s): TOTAL LAPAROSCOPIC HYSTERECTOMY WITH BILATERAL SALPINGO OOPHORECTOMY CYSTOSCOPY  SURGEON: Adrian Prows MD  ASSISTANT: Barnett Applebaum MD   ANESTHESIA: General endotracheal anesthesia  ESTIMATED BLOOD LOSS: Minimal, 5 cc  SPECIMENS: Uterus, bilateral fallopian tubes and ovaries  COMPLICATIONS: None  DISPOSITION: stable to PACU  FINDINGS: Intraabdominal adhesions were not noted. Normal uterus, fallopian tubes and ovaries. Gallbladder enlarged. Normal liver edge.  PROCEDURE:  The patient was taken to the OR where anesthesia was administed. She was prepped and draped in the normal sterile fashion in the dorsal lithotomy position in the [place] stirrups. A time out was performed. A Graves speculum was inserted, the cervix was grasped with a single tooth tenaculum and the endometrial cavity was sounded. The cervix was progressively dilated to a size 18 [place] with [employer] dilators. A V-Care uterine manipulator was inserted in the usual fashion without incident. Gloves were changed and attention was turned to the abdomen.   An infraumbilical transverse 10 mm skin incision was made with the scalpel after local anesthesia applied to the skin. A 5 mm trocar was inserted under direct visualization.  A pneumoperitoneum was obtained by insufflation of CO2 (opening pressure of 71mHg) to 151mg. A diagnostic laparoscopy was performed yielding the previously described findings. Attention was turned to the left lower quadrant where after visualization of the inferior epigastric vessels a 2m23mkin incision was made with the scalpel. A 5 mm laparoscopic port was inserted. The umbilical port was exchanged for a 11 mm Airseal port. Attention was turned to the right lower quadrant where after visualization of the inferior epigastric vessels a 2mm74min incision was made with the scalpel. A 5 mm laparoscopic port  was inserted.    Attention was then turned to the left adnexa. The left fallopian tube and ovary were visualized. The left infundibulo pelvic ligament was coagulated and transected using the harmonic scalpel .  Attention was turned to the left aspect of the uterus, where after visualization of the ureter, the round ligament was coagulated and transected using the 2mm 4mmonic Scapel. The anterior and posterior leafs of the broad ligament were dissected off as the anterior one was coagulated and transected in a caudal direction towards the cuff of the uterine manipulator.  Attention was turned to the right aspect of the uterus where the same procedure was performed.  The vesicouterine reflection of the peritoneum was dissected with the harmonic scapel and the bladder flap was created bluntly.  The uterine vessels were coagulated and transected bilaterally using first bipolar cautery and then the harmonic scapel. A 360 degree, circumferential colpotomy was done to completely amputate the uterus with cervix and tubes. Once the specimen was amputated it was delivered through the vagina.   The colpotomy was repaired in a simple running fashion using a delayed absorbable suture with an endo-stitch device.  Vaginal exam confirmed complete closure. A survey of the pelvic cavity revealed adequate hemostasis and no injury to bowel, bladder, or ureter.   At this point the procedure was finalized. umbilical fascia incision was closed with a vicryl suture using the fascia closure device. All the instruments were removed from the patient's body. Gas was expelled and patient is leveled.  Incisions are closed with 4-0 monocryl and skin adhesive.      A diagnostic cystoscopy was performed using saline distension of bladder with no lesions or injuries noted.  Bilateral urine flow from each  ureteral orifice was visualized.  Dr. Kenton Kingfisher assisted with the case. No other skilled surgical assistant available. He help with trocar  placement, retractation of tissue, operation of the camera, and coagulation and transection of tissue.   Patient was taken to the recovery room in stable condition.  All sponge, instrument, and needle counts are correct x2.        Adrian Prows MD [facility], [facility] 08/05/2019 9:30 AM   .crsor

## 2019-08-05 NOTE — Anesthesia Preprocedure Evaluation (Signed)
Anesthesia Evaluation  Patient identified by MRN, date of birth, ID band Patient awake    Reviewed: Allergy & Precautions, H&P , NPO status , reviewed documented beta blocker date and time   History of Anesthesia Complications Negative for: history of anesthetic complications  Airway Mallampati: II  TM Distance: >3 FB Neck ROM: full    Dental  (+) Caps, Teeth Intact, Dental Advidsory Given   Pulmonary neg shortness of breath, neg COPD, neg recent URI, Current Smoker and Patient abstained from smoking.,    Pulmonary exam normal        Cardiovascular Exercise Tolerance: Good hypertension, (-) angina(-) Past MI and (-) Cardiac Stents Normal cardiovascular exam(-) dysrhythmias (-) Valvular Problems/Murmurs     Neuro/Psych PSYCHIATRIC DISORDERS Depression negative neurological ROS     GI/Hepatic Neg liver ROS, GERD  Medicated and Controlled,  Endo/Other  negative endocrine ROS  Renal/GU negative Renal ROS     Musculoskeletal   Abdominal   Peds  Hematology  (+) Blood dyscrasia, anemia ,   Anesthesia Other Findings Past Medical History: No date: Benign neoplasm of sigmoid colon No date: Benign neoplasm of transverse colon No date: Breast cancer (HCC) No date: Family history of BRCA2 gene positive No date: Family history of breast cancer No date: Family history of colon cancer No date: Family history of lung cancer No date: Family history of pancreatic cancer No date: GERD (gastroesophageal reflux disease) No date: History of kidney stones No date: Hypertension 12/17/2018: Malignant neoplasm of lower-outer quadrant of right breast  of female, estrogen receptor positive ([place]) No date: Smoker  Past Surgical History: 01/14/2019: AXILLARY SENTINEL NODE BIOPSY; Bilateral     Comment:  Procedure: AXILLARY SENTINEL NODE BIOPSY;  Surgeon:               Herbert Pun, MD;  Location: [facility];  Service:               General;  Laterality: Bilateral; 12/08/2018: BREAST BIOPSY; Right     Comment:  [place] bx of mass with calcs path pending 12/08/2018: BREAST BIOPSY; Right     Comment:  [place] bx of LN, path pending 01/14/2019: BREAST RECONSTRUCTION WITH PLACEMENT OF TISSUE EXPANDER  AND ALLODERM; Bilateral     Comment:  Procedure: BILATERAL BREAST RECONSTRUCTION WITH               PLACEMENT OF TISSUE EXPANDER AND FLEX HD;  Surgeon:               Wallace Going, DO;  Location: [facility];  Service:               Plastics;  Laterality: Bilateral; 02/17/2015: COLONOSCOPY WITH PROPOFOL; N/A     Comment:  Procedure: COLONOSCOPY WITH PROPOFOL;  Surgeon: Lucilla Lame, MD;  Location: [place];  Service:               Endoscopy;  Laterality: N/A; 02/17/2015: POLYPECTOMY     Comment:  Procedure: POLYPECTOMY;  Surgeon: Lucilla Lame, MD;                Location: [employer];  Service: Endoscopy;; 01/14/2019: SIMPLE MASTECTOMY WITH AXILLARY SENTINEL NODE BIOPSY; Left     Comment:  Procedure: LEFT SIMPLE MASTECTOMY;  Surgeon:               Herbert Pun, MD;  Location: [facility];  Service:  General;  Laterality: Left; 01/14/2019: TOTAL MASTECTOMY; Right     Comment:  Procedure: RIGHT TOTAL MASTECTOMY;  Surgeon:               Herbert Pun, MD;  Location: [facility];  Service:              General;  Laterality: Right; No date: WISDOM TOOTH EXTRACTION  BMI    Body Mass Index: 26.41 kg/m      Reproductive/Obstetrics                             Anesthesia Physical  Anesthesia Plan  ASA: II  Anesthesia Plan: General   Post-op Pain Management:    Induction: Intravenous  PONV Risk Score and Plan: Ondansetron, Dexamethasone, Midazolam, Promethazine and Treatment may vary due to age or medical condition  Airway Management Planned: Oral ETT  Additional Equipment:   Intra-op Plan:   Post-operative Plan: Extubation in  OR  Informed Consent: I have reviewed the patients History and Physical, chart, labs and discussed the procedure including the risks, benefits and alternatives for the proposed anesthesia with the patient or authorized representative who has indicated his/her understanding and acceptance.     Dental Advisory Given  Plan Discussed with: CRNA  Anesthesia Plan Comments: (Possible LMA vs [place])        Anesthesia Quick Evaluation

## 2019-08-05 NOTE — Interval H&P Note (Signed)
History and Physical Interval Note:  08/05/2019 7:10 AM  Cheryl Mooney  has presented today for surgery, with the diagnosis of Z15.01.  The various methods of treatment have been discussed with the patient and family. After consideration of risks, benefits and other options for treatment, the patient has consented to  Procedure(s): TOTAL LAPAROSCOPIC HYSTERECTOMY WITH BILATERAL SALPINGO OOPHORECTOMY (Bilateral) CYSTOSCOPY (N/A) as a surgical intervention.  The patient's history has been reviewed, patient examined, no change in status, stable for surgery.  I have reviewed the patient's chart and labs.  Questions were answered to the patient's satisfaction.     [place]

## 2019-08-05 NOTE — Anesthesia Procedure Notes (Signed)
Procedure Name: Intubation Performed by: Fredderick Phenix, CRNA Pre-anesthesia Checklist: Patient identified, Emergency Drugs available, Suction available and Patient being monitored Patient Re-evaluated:Patient Re-evaluated prior to induction Oxygen Delivery Method: Circle system utilized Preoxygenation: Pre-oxygenation with 100% oxygen Induction Type: IV induction Ventilation: Mask ventilation without difficulty Laryngoscope Size: McGraph Grade View: Grade II Tube type: Oral Tube size: 7.0 mm Number of attempts: 1 Airway Equipment and Method: Bougie stylet Placement Confirmation: ETT inserted through vocal cords under direct vision,  positive ETCO2 and breath sounds checked- equal and bilateral Secured at: 20 cm Tube secured with: Tape Dental Injury: Teeth and Oropharynx as per pre-operative assessment

## 2019-08-08 NOTE — Anesthesia Postprocedure Evaluation (Signed)
Anesthesia Post Note  Patient: EULANDA CANTLON  Procedure(s) Performed: TOTAL LAPAROSCOPIC HYSTERECTOMY WITH BILATERAL SALPINGO OOPHORECTOMY (Bilateral ) CYSTOSCOPY (N/A )  Patient location during evaluation: PACU Anesthesia Type: General Level of consciousness: awake and alert and oriented Pain management: pain level controlled Vital Signs Assessment: post-procedure vital signs reviewed and stable Respiratory status: spontaneous breathing Cardiovascular status: blood pressure returned to baseline Anesthetic complications: no     Last Vitals:  Vitals:   08/05/19 1120 08/05/19 1258  BP: (!) 149/88 (!) 152/87  Pulse:  69  Resp:  16  Temp:    SpO2:  98%    Last Pain:  Vitals:   08/06/19 0801  TempSrc:   PainSc: 2                  Annaleese Guier

## 2019-08-09 LAB — SURGICAL PATHOLOGY

## 2019-08-09 NOTE — Progress Notes (Signed)
Normal pathology- called and left patient message.

## 2019-08-12 ENCOUNTER — Ambulatory Visit: Payer: BC Managed Care – PPO | Admitting: Internal Medicine

## 2019-08-12 ENCOUNTER — Telehealth: Payer: Self-pay

## 2019-08-12 DIAGNOSIS — C50511 Malignant neoplasm of lower-outer quadrant of right female breast: Secondary | ICD-10-CM

## 2019-08-12 NOTE — Telephone Encounter (Signed)
Survivorship Care Plan visit completed.  Treatment summary reviewed and mailed to patient.  ASCO answers booklet reviewed and mailed to patient.  CARE program and Cancer Transitions discussed with patient along with other resources cancer center offers to patients and caregivers.  Patient verbalized understanding.  SCP packet mailed.  Patient in agreement for APP to have a Virtual visit to introduce them to the Survivorship Clinic.  Encouraged patient to call for any questions or concerns. 

## 2019-08-13 ENCOUNTER — Other Ambulatory Visit: Payer: Self-pay

## 2019-08-13 ENCOUNTER — Encounter: Payer: Self-pay | Admitting: Obstetrics and Gynecology

## 2019-08-13 ENCOUNTER — Ambulatory Visit (INDEPENDENT_AMBULATORY_CARE_PROVIDER_SITE_OTHER): Payer: BC Managed Care – PPO | Admitting: Obstetrics and Gynecology

## 2019-08-13 VITALS — BP 148/90 | Ht 65.0 in | Wt 158.0 lb

## 2019-08-13 DIAGNOSIS — Z90721 Acquired absence of ovaries, unilateral: Secondary | ICD-10-CM

## 2019-08-13 DIAGNOSIS — Z90710 Acquired absence of both cervix and uterus: Secondary | ICD-10-CM

## 2019-08-13 DIAGNOSIS — K828 Other specified diseases of gallbladder: Secondary | ICD-10-CM

## 2019-08-13 NOTE — Progress Notes (Signed)
  Postoperative Follow-up Patient presents post op from laparoscopic assisted vaginal hysterectomy for BRCA 2, 1 week ago.  Subjective: Patient reports marked improvement in her preop symptoms. Eating a regular diet without difficulty. Pain is controlled with current analgesics. Medications being used: acetaminophen, ibuprofen (OTC) and roxicodone.  Activity: normal activities of daily living. Patient reports additional symptom's since surgery of None.  Objective: BP (!) 148/90   Ht 5' 5" (1.651 m)   Wt 158 lb (71.7 kg)   BMI 26.29 kg/m  Physical Exam Constitutional:      Appearance: She is well-developed.  Genitourinary:     Vagina and uterus normal.     No lesions in the vagina.     No cervical motion tenderness.     No right or left adnexal mass present.  HENT:     Head: Normocephalic and atraumatic.  Neck:     Thyroid: No thyromegaly.  Cardiovascular:     Rate and Rhythm: Normal rate and regular rhythm.     Heart sounds: Normal heart sounds.  Pulmonary:     Effort: Pulmonary effort is normal.     Breath sounds: Normal breath sounds.  Chest:     Breasts:        Right: No inverted nipple, mass, nipple discharge or skin change.        Left: No inverted nipple, mass, nipple discharge or skin change.  Abdominal:     General: Abdomen is flat. Bowel sounds are normal. There is no distension.     Palpations: Abdomen is soft. There is no mass.     Comments: Incisions, clean, dry, intact  Musculoskeletal:     Cervical back: Neck supple.  Neurological:     Mental Status: She is alert and oriented to person, place, and time.  Skin:    General: Skin is warm and dry.  Psychiatric:        Behavior: Behavior normal.        Thought Content: Thought content normal.        Judgment: Judgment normal.  Vitals reviewed.    Assessment: s/p :  total laparoscopic hysterectomy with bilateral salpingectomy and oophorectomy- stable Plan: Patient has done well after surgery with no  apparent complications.  I have discussed the post-operative course to date, and the expected progress moving forward.  The patient understands what complications to be concerned about.  I will see the patient in routine follow up, or sooner if needed.    Will order [place] for enlarged gallbladder seen during surgery.  Activity plan: No heavy lifting.  Pelvic rest.  Cheryl Mooney 08/13/2019, 11:38 AM

## 2019-08-16 ENCOUNTER — Other Ambulatory Visit: Payer: Self-pay

## 2019-08-16 ENCOUNTER — Telehealth: Payer: Self-pay

## 2019-08-16 ENCOUNTER — Inpatient Hospital Stay: Payer: BC Managed Care – PPO | Attending: Oncology

## 2019-08-16 DIAGNOSIS — Z853 Personal history of malignant neoplasm of breast: Secondary | ICD-10-CM | POA: Insufficient documentation

## 2019-08-16 DIAGNOSIS — Z95828 Presence of other vascular implants and grafts: Secondary | ICD-10-CM

## 2019-08-16 DIAGNOSIS — Z87891 Personal history of nicotine dependence: Secondary | ICD-10-CM | POA: Insufficient documentation

## 2019-08-16 DIAGNOSIS — G4700 Insomnia, unspecified: Secondary | ICD-10-CM | POA: Insufficient documentation

## 2019-08-16 DIAGNOSIS — E871 Hypo-osmolality and hyponatremia: Secondary | ICD-10-CM | POA: Insufficient documentation

## 2019-08-16 DIAGNOSIS — C50511 Malignant neoplasm of lower-outer quadrant of right female breast: Secondary | ICD-10-CM

## 2019-08-16 MED ORDER — HEPARIN SOD (PORK) LOCK FLUSH 100 UNIT/ML IV SOLN
INTRAVENOUS | Status: AC
  Filled 2019-08-16: qty 5

## 2019-08-16 MED ORDER — HEPARIN SOD (PORK) LOCK FLUSH 100 UNIT/ML IV SOLN
500.0000 [IU] | Freq: Once | INTRAVENOUS | Status: AC
  Administered 2019-08-16: 500 [IU] via INTRAVENOUS
  Filled 2019-08-16: qty 5

## 2019-08-16 MED ORDER — SODIUM CHLORIDE 0.9% FLUSH
10.0000 mL | Freq: Once | INTRAVENOUS | Status: AC
  Administered 2019-08-16: 10 mL via INTRAVENOUS
  Filled 2019-08-16: qty 10

## 2019-08-16 NOTE — Telephone Encounter (Signed)
Dental clearance request letter faxed to dentist office.  

## 2019-08-17 ENCOUNTER — Other Ambulatory Visit: Payer: Self-pay | Admitting: Oncology

## 2019-08-17 ENCOUNTER — Ambulatory Visit (INDEPENDENT_AMBULATORY_CARE_PROVIDER_SITE_OTHER): Payer: BC Managed Care – PPO | Admitting: Surgical

## 2019-08-17 ENCOUNTER — Other Ambulatory Visit: Payer: Self-pay | Admitting: Internal Medicine

## 2019-08-17 ENCOUNTER — Encounter: Payer: Self-pay | Admitting: Surgical

## 2019-08-17 ENCOUNTER — Encounter: Payer: Self-pay | Admitting: Internal Medicine

## 2019-08-17 VITALS — BP 150/101 | HR 77 | Temp 98.2°F | Ht 64.0 in | Wt 154.0 lb

## 2019-08-17 DIAGNOSIS — Z1501 Genetic susceptibility to malignant neoplasm of breast: Secondary | ICD-10-CM | POA: Diagnosis not present

## 2019-08-17 DIAGNOSIS — Z9013 Acquired absence of bilateral breasts and nipples: Secondary | ICD-10-CM

## 2019-08-17 DIAGNOSIS — C50511 Malignant neoplasm of lower-outer quadrant of right female breast: Secondary | ICD-10-CM | POA: Diagnosis not present

## 2019-08-17 DIAGNOSIS — Z170 Estrogen receptor positive status [ER+]: Secondary | ICD-10-CM

## 2019-08-17 DIAGNOSIS — I10 Essential (primary) hypertension: Secondary | ICD-10-CM

## 2019-08-17 DIAGNOSIS — Z1502 Genetic susceptibility to malignant neoplasm of ovary: Secondary | ICD-10-CM

## 2019-08-17 DIAGNOSIS — Z1509 Genetic susceptibility to other malignant neoplasm: Secondary | ICD-10-CM

## 2019-08-17 MED ORDER — LISINOPRIL-HYDROCHLOROTHIAZIDE 20-25 MG PO TABS: 1.0000 | ORAL_TABLET | Freq: Every day | ORAL | 1 refills | Status: DC

## 2019-08-17 MED ORDER — DIAZEPAM 2 MG PO TABS: 2.0000 mg | ORAL_TABLET | Freq: Two times a day (BID) | ORAL | 0 refills | Status: DC | PRN

## 2019-08-17 NOTE — Telephone Encounter (Signed)
Please schedule zometa treatment, next available date. Also lab encounter please order BMP. Thanks.

## 2019-08-17 NOTE — Progress Notes (Signed)
   Subjective:     Patient ID: Cheryl Mooney, female    DOB: 1966/03/25, 54 y.o.   MRN: [id number]  Chief Complaint  Patient presents with  . Follow-up   HPI: The patient is a 54 y.o. female here for follow-up on her bilateral breast reconstruction, she had expanders placed after mastectomy on 01/14/2019.  Patient is doing well today.  She is postop from laparoscopic hysterectomy.  She tolerated last expansion fine without any complaints.  Review of Systems  Constitutional: Negative.   Skin: Negative.    Objective:   Vital Signs BP (!) 150/101 (BP Location: Left Arm, Patient Position: Sitting, Cuff Size: Normal)   Pulse 77   Temp 98.2 F (36.8 C) (Temporal)   Ht '5\' 4"'$  (1.626 m)   Wt 154 lb (69.9 kg)   SpO2 98%   BMI 26.43 kg/m  Vital Signs and Nursing Note Reviewed Chaperone present Physical Exam  Constitutional: She is oriented to person, place, and time.  Pulmonary/Chest: Effort normal.    Musculoskeletal:        General: Normal range of motion.  Neurological: She is alert and oriented to person, place, and time. Gait normal.  Skin: Skin is warm and dry.    Assessment/Plan:     ICD-10-CM   1. BRCA2 gene mutation positive in female  Z15.01    Z15.02    Z15.09   2. Acquired absence of bilateral breasts and nipples  Z90.13   3. Malignant neoplasm of lower-outer quadrant of right breast of female, estrogen receptor positive ([place])  C50.511    Z17.0      Patient is doing well, incisions are well-healed, no sign of infection, seroma, hematoma.  She is nearing maximum expansion on the right side due to tightness of mastectomy skin, patient to be evaluated in 2 and half weeks with Dr. Marla Roe to discuss possibility for further refills.  We placed injectable saline in the Expander using a sterile technique: Right: 50 cc for a total of 405/535 cc Left: 50 cc for a total of 405/535 cc  Carola Rhine Emil Klassen, PA-C 08/17/2019, 11:34 AM

## 2019-08-17 NOTE — Telephone Encounter (Signed)
Dental clearance scanned in media.  When would you like to get her scheduled and is it Zometa as discussed at last visit?

## 2019-08-18 ENCOUNTER — Inpatient Hospital Stay: Payer: BC Managed Care – PPO

## 2019-08-18 NOTE — Telephone Encounter (Signed)
Done...  Called pt and made her aware of her 08/24/19 appt sched for Lab/*NEW* Zometa

## 2019-08-18 NOTE — Telephone Encounter (Signed)
Please schedule and inform pt of appt (Zometa *new*)

## 2019-08-20 ENCOUNTER — Ambulatory Visit
Admission: RE | Admit: 2019-08-20 | Discharge: 2019-08-20 | Disposition: A | Discharge status: Home / Self Care | Payer: BC Managed Care – PPO | Source: Ambulatory Visit | Attending: Obstetrics and Gynecology | Admitting: Obstetrics and Gynecology

## 2019-08-20 ENCOUNTER — Other Ambulatory Visit: Payer: Self-pay | Admitting: Oncology

## 2019-08-20 ENCOUNTER — Other Ambulatory Visit: Payer: Self-pay

## 2019-08-20 DIAGNOSIS — K828 Other specified diseases of gallbladder: Secondary | ICD-10-CM

## 2019-08-23 ENCOUNTER — Other Ambulatory Visit: Payer: Self-pay

## 2019-08-23 ENCOUNTER — Ambulatory Visit (INDEPENDENT_AMBULATORY_CARE_PROVIDER_SITE_OTHER): Payer: BC Managed Care – PPO

## 2019-08-23 DIAGNOSIS — Z23 Encounter for immunization: Secondary | ICD-10-CM | POA: Diagnosis not present

## 2019-08-24 ENCOUNTER — Ambulatory Visit: Payer: BC Managed Care – PPO

## 2019-08-24 ENCOUNTER — Inpatient Hospital Stay: Payer: BC Managed Care – PPO

## 2019-08-24 VITALS — BP 130/82 | HR 78 | Temp 98.0°F | Resp 16

## 2019-08-24 DIAGNOSIS — C50919 Malignant neoplasm of unspecified site of unspecified female breast: Secondary | ICD-10-CM

## 2019-08-24 DIAGNOSIS — C50511 Malignant neoplasm of lower-outer quadrant of right female breast: Secondary | ICD-10-CM

## 2019-08-24 DIAGNOSIS — E871 Hypo-osmolality and hyponatremia: Secondary | ICD-10-CM

## 2019-08-24 LAB — CBC WITH DIFFERENTIAL/PLATELET
Abs Immature Granulocytes: 0.02 10*3/uL (ref 0.00–0.07)
Basophils Absolute: 0.1 10*3/uL (ref 0.0–0.1)
Basophils Relative: 1 %
Eosinophils Absolute: 0.2 10*3/uL (ref 0.0–0.5)
Eosinophils Relative: 2 %
HCT: 42.4 % (ref 36.0–46.0)
Hemoglobin: 14.5 g/dL (ref 12.0–15.0)
Immature Granulocytes: 0 %
Lymphocytes Relative: 23 %
Lymphs Abs: 1.8 10*3/uL (ref 0.7–4.0)
MCH: 30.3 pg (ref 26.0–34.0)
MCHC: 34.2 g/dL (ref 30.0–36.0)
MCV: 88.7 fL (ref 80.0–100.0)
Monocytes Absolute: 0.5 10*3/uL (ref 0.1–1.0)
Monocytes Relative: 7 %
Neutro Abs: 5.2 10*3/uL (ref 1.7–7.7)
Neutrophils Relative %: 67 %
Platelets: 318 10*3/uL (ref 150–400)
RBC: 4.78 MIL/uL (ref 3.87–5.11)
RDW: 13.2 % (ref 11.5–15.5)
WBC: 7.7 10*3/uL (ref 4.0–10.5)
nRBC: 0 % (ref 0.0–0.2)

## 2019-08-24 LAB — COMPREHENSIVE METABOLIC PANEL
ALT: 13 U/L (ref 0–44)
AST: 19 U/L (ref 15–41)
Albumin: 4.3 g/dL (ref 3.5–5.0)
Alkaline Phosphatase: 102 U/L (ref 38–126)
Anion gap: 10 (ref 5–15)
BUN: 8 mg/dL (ref 6–20)
CO2: 24 mmol/L (ref 22–32)
Calcium: 9.5 mg/dL (ref 8.9–10.3)
Chloride: 99 mmol/L (ref 98–111)
Creatinine, Ser: 0.71 mg/dL (ref 0.44–1.00)
GFR calc Af Amer: >60 mL/min (ref >60)
GFR calc non Af Amer: >60 mL/min (ref >60)
Glucose, Bld: 189 mg/dL — ABNORMAL HIGH (ref 70–99)
Potassium: 3.4 mmol/L — ABNORMAL LOW (ref 3.5–5.1)
Sodium: 133 mmol/L — ABNORMAL LOW (ref 135–145)
Total Bilirubin: 0.5 mg/dL (ref 0.3–1.2)
Total Protein: 7.4 g/dL (ref 6.5–8.1)

## 2019-08-24 MED ORDER — HEPARIN SOD (PORK) LOCK FLUSH 100 UNIT/ML IV SOLN
500.0000 [IU] | Freq: Once | INTRAVENOUS | Status: AC | PRN
  Administered 2019-08-24: 500 [IU]
  Filled 2019-08-24: qty 5

## 2019-08-24 MED ORDER — SODIUM CHLORIDE 0.9 % IV SOLN
Freq: Once | INTRAVENOUS | Status: AC
  Filled 2019-08-24: qty 250

## 2019-08-24 MED ORDER — ZOLEDRONIC ACID 4 MG/100ML IV SOLN
4.0000 mg | Freq: Once | INTRAVENOUS | Status: AC
  Administered 2019-08-24: 4 mg via INTRAVENOUS
  Filled 2019-08-24: qty 100

## 2019-08-24 MED ORDER — SODIUM CHLORIDE 0.9% FLUSH
10.0000 mL | Freq: Once | INTRAVENOUS | Status: DC | PRN
  Filled 2019-08-24: qty 10

## 2019-08-24 MED ORDER — HEPARIN SOD (PORK) LOCK FLUSH 100 UNIT/ML IV SOLN
INTRAVENOUS | Status: AC
  Filled 2019-08-24: qty 5

## 2019-08-26 ENCOUNTER — Inpatient Hospital Stay (HOSPITAL_BASED_OUTPATIENT_CLINIC_OR_DEPARTMENT_OTHER): Payer: BC Managed Care – PPO | Admitting: Oncology

## 2019-08-26 DIAGNOSIS — C50511 Malignant neoplasm of lower-outer quadrant of right female breast: Secondary | ICD-10-CM

## 2019-08-26 DIAGNOSIS — Z170 Estrogen receptor positive status [ER+]: Secondary | ICD-10-CM

## 2019-08-26 MED ORDER — TRAZODONE HCL 50 MG PO TABS: 50.0000 mg | ORAL_TABLET | Freq: Every day | ORAL | 0 refills | Status: DC

## 2019-08-26 NOTE — Progress Notes (Signed)
Survivorship Clinic Consult Note [facility]  [phone] [phone]  CLINIC:  Survivorship  REASON FOR VISIT:  Survivorship surveillance visit for patient with history of Breast Cancer   I connected with Cheryl Mooney on 08/26/19 at 10:00 AM EDT by telephone visit and verified that I am speaking with the correct person using two identifiers.   I discussed the limitations, risks, security and privacy concerns of performing an evaluation and management service by telemedicine and the availability of in-person appointments. I also discussed with the patient that there may be a patient responsible charge related to this service. The patient expressed understanding and agreed to proceed.   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 breast of female, estrogen receptor positive ([place])   01/21/2019 Cancer Staging   Staging form: Breast, AJCC 8th Edition - Pathologic stage from 01/21/2019: Stage IB (pT2, pN0, cM0, G3, ER+, PR+, HER2-) - Signed by Earlie Server, MD on 01/22/2019   02/26/2019 - 03/11/2019 Chemotherapy   The patient had DOXOrubicin (ADRIAMYCIN) chemo injection 100 mg, 110 mg, Intravenous,  Once, 1 of 4 cycles Administration: 100 mg (02/26/2019) palonosetron (ALOXI) injection 0.25 mg, 0.25 mg, Intravenous,  Once, 1 of 4 cycles Administration: 0.25 mg (02/26/2019) pegfilgrastim-jmdb (FULPHILA) injection 6 mg, 6 mg, Subcutaneous,  Once, 1 of 4 cycles Administration: 6 mg (03/01/2019) cyclophosphamide (CYTOXAN) 1,000 mg in sodium chloride 0.9 % 250 mL chemo infusion, 1,100 mg, Intravenous,  Once, 1 of 4  cycles Administration: 1,000 mg (02/26/2019) PACLitaxel (TAXOL) 144 mg in sodium chloride 0.9 % 250 mL chemo infusion (</= 61m/m2), 80 mg/m2, Intravenous,  Once, 0 of 12 cycles fosaprepitant (EMEND) 150 mg, dexamethasone (DECADRON) 12 mg in sodium chloride 0.9 % 145 mL IVPB, , Intravenous,  Once, 1 of 4 cycles Administration:  (02/26/2019)  for chemotherapy treatment.    03/18/2019 -  Chemotherapy   The patient had palonosetron (ALOXI) injection 0.25 mg, 0.25 mg, Intravenous,  Once, 4 of 4 cycles Administration: 0.25 mg (03/18/2019), 0.25 mg (04/12/2019), 0.25 mg (05/03/2019), 0.25 mg (05/24/2019) pegfilgrastim-jmdb (FULPHILA) injection 6 mg, 6 mg, Subcutaneous,  Once, 4 of 4 cycles Administration: 6 mg (03/19/2019), 6 mg (04/13/2019), 6 mg (05/04/2019), 6 mg (05/25/2019) CARBOplatin (PARAPLATIN) 570 mg in sodium chloride 0.9 % 250 mL chemo infusion, 570 mg (100 % of original dose 565.5 mg), Intravenous,  Once, 4 of 4 cycles Dose modification:   (original dose 565.5 mg, Cycle 1),   (original dose 565.5 mg, Cycle 2) Administration: 570 mg (03/18/2019), 570 mg (04/12/2019), 570 mg (05/03/2019), 570 mg (05/24/2019) DOCEtaxel (TAXOTERE) 130 mg in sodium chloride 0.9 % 250 mL chemo infusion, 75 mg/m2 = 130 mg, Intravenous,  Once, 4 of 4 cycles Administration: 130 mg (03/18/2019), 130 mg (04/12/2019), 130 mg (05/03/2019), 130 mg (05/24/2019) fosaprepitant (EMEND) 150 mg in sodium chloride 0.9 % 145 mL IVPB, 150 mg, Intravenous,  Once, 1 of 1 cycle fosaprepitant (EMEND) 150 mg, dexamethasone (DECADRON) 12 mg in sodium chloride 0.9 % 145 mL IVPB, , Intravenous,  Once, 4 of 4 cycles Administration:  (03/18/2019),  (04/12/2019),  (05/03/2019),  (05/24/2019)  for chemotherapy treatment.    Breast cancer ([place]  01/14/2019 Initial Diagnosis   [place] ([place]  03/18/2019 -  Chemotherapy   The patient had palonosetron (ALOXI) injection 0.25 mg, 0.25 mg, Intravenous,  Once, 4 of 4 cycles Administration: 0.25 mg  (03/18/2019), 0.25 mg (04/12/2019), 0.25 mg (05/03/2019), 0.25 mg (05/24/2019) pegfilgrastim-jmdb (FULPHILA) injection 6 mg, 6 mg, Subcutaneous,  Once, 4 of 4 cycles Administration: 6 mg (03/19/2019), 6 mg (04/13/2019), 6 mg (05/04/2019), 6 mg (05/25/2019) CARBOplatin (PARAPLATIN) 570 mg in sodium chloride 0.9 % 250 mL chemo infusion, 570 mg (100 % of original dose 565.5 mg), Intravenous,  Once, 4 of 4 cycles Dose modification:   (original dose 565.5 mg, Cycle 1),   (original dose 565.5 mg, Cycle 2) Administration: 570 mg (03/18/2019), 570 mg (04/12/2019), 570 mg (05/03/2019), 570 mg (05/24/2019) DOCEtaxel (TAXOTERE) 130 mg in sodium chloride 0.9 % 250 mL chemo infusion, 75 mg/m2 = 130 mg, Intravenous,  Once, 4 of 4 cycles Administration: 130 mg (03/18/2019), 130 mg (04/12/2019), 130 mg (05/03/2019), 130 mg (05/24/2019) fosaprepitant (EMEND) 150 mg in sodium chloride 0.9 % 145 mL IVPB, 150 mg, Intravenous,  Once, 1 of 1 cycle fosaprepitant (EMEND) 150 mg, dexamethasone (DECADRON) 12 mg in sodium chloride 0.9 % 145 mL IVPB, , Intravenous,  Once, 4 of 4 cycles Administration:  (03/18/2019),  (04/12/2019),  (05/03/2019),  (05/24/2019)  for chemotherapy treatment.     INTERVAL HISTORY:  Cheryl Mooney presents below to discuss her survivorship care plan and to answer any concerns or questions she has at this time.  She was evaluated by Dr. Tasia Catchings last on 07/08/2019.  She appeared to be tolerating Arimidex well with only intermittent hot flashes that were manageable.  She was scheduled to be seen by her gynecologist for prophylactic ovarian and fallopian tube removal at the end of April.  On 08/05/2019 patient had total laparoscopic hysterectomy with bilateral salpingo-oophorectomy.  Pathology was negative for malignancy.  She was considered eligible for pancreatic screening.  ADDITIONAL REVIEW OF SYSTEMS:  Review of Systems  Constitutional: Negative.  Negative for chills, fever, malaise/fatigue and weight loss.  HENT:  Negative for congestion, ear pain and tinnitus.   Eyes: Negative.  Negative for blurred vision and double vision.  Respiratory: Negative.  Negative for cough, sputum production and shortness of breath.   Cardiovascular: Negative.  Negative for chest pain, palpitations and leg swelling.  Gastrointestinal: Negative.  Negative for abdominal pain, constipation, diarrhea, nausea and vomiting.  Genitourinary: Negative for dysuria, frequency and urgency.  Musculoskeletal: Negative for back pain and falls.  Skin: Negative.  Negative for rash.  Neurological: Negative.  Negative for weakness and headaches.  Endo/Heme/Allergies: Negative.  Does not bruise/bleed easily.  Psychiatric/Behavioral: Negative for depression. The patient has insomnia. The patient is not nervous/anxious.     PAST MEDICAL & SURGICAL HISTORY:  Past Medical History:  Diagnosis Date  . Anemia    things seem back to normal  . Benign neoplasm of sigmoid colon   . Benign neoplasm of transverse colon   . Breast cancer ([place])   . Depression   . Family history of BRCA2 gene positive   . Family history of breast cancer   . Family history of colon cancer   . Family history of lung cancer   . Family history of pancreatic cancer   . GERD (gastroesophageal reflux disease)   . History of kidney stones   . Hypertension   . Malignant neoplasm of lower-outer quadrant of right breast of female, estrogen receptor positive ([place]) 12/17/2018  . Smoker    Past Surgical History:  Procedure Laterality Date  .  AXILLARY SENTINEL NODE BIOPSY Bilateral 01/14/2019   Procedure: AXILLARY SENTINEL NODE BIOPSY;  Surgeon: Herbert Pun, MD;  Location: [facility];  Service: General;  Laterality: Bilateral;  . BREAST BIOPSY Right 12/08/2018   [place] bx of mass with calcs path pending  . BREAST BIOPSY Right 12/08/2018   [place] bx of LN, path pending  . BREAST RECONSTRUCTION WITH PLACEMENT OF TISSUE EXPANDER AND ALLODERM Bilateral 01/14/2019   Procedure:  BILATERAL BREAST RECONSTRUCTION WITH PLACEMENT OF TISSUE EXPANDER AND FLEX HD;  Surgeon: Wallace Going, DO;  Location: [facility];  Service: Plastics;  Laterality: Bilateral;  . COLONOSCOPY WITH PROPOFOL N/A 02/17/2015   Procedure: COLONOSCOPY WITH PROPOFOL;  Surgeon: Lucilla Lame, MD;  Location: [place];  Service: Endoscopy;  Laterality: N/A;  . COLONOSCOPY WITH PROPOFOL N/A 07/12/2019   Procedure: COLONOSCOPY WITH BIOPSY;  Surgeon: Lucilla Lame, MD;  Location: [place];  Service: Endoscopy;  Laterality: N/A;  priority 3 PORT A CATH NEEDS ACCESSED  . CYSTOSCOPY N/A 08/05/2019   Procedure: CYSTOSCOPY;  Surgeon: Homero Fellers, MD;  Location: [facility];  Service: Gynecology;  Laterality: N/A;  . POLYPECTOMY  02/17/2015   Procedure: POLYPECTOMY;  Surgeon: Lucilla Lame, MD;  Location: [place];  Service: Endoscopy;;  . POLYPECTOMY N/A 07/12/2019   Procedure: POLYPECTOMY;  Surgeon: Lucilla Lame, MD;  Location: [place];  Service: Endoscopy;  Laterality: N/A;  . PORTACATH PLACEMENT Left 02/18/2019   Procedure: INSERTION PORT-A-CATH;  Surgeon: Herbert Pun, MD;  Location: [facility];  Service: General;  Laterality: Left;  . SIMPLE MASTECTOMY WITH AXILLARY SENTINEL NODE BIOPSY Left 01/14/2019   Procedure: LEFT SIMPLE MASTECTOMY;  Surgeon: Herbert Pun, MD;  Location: [facility];  Service: General;  Laterality: Left;  . TOTAL LAPAROSCOPIC HYSTERECTOMY WITH BILATERAL SALPINGO OOPHORECTOMY Bilateral 08/05/2019   Procedure: TOTAL LAPAROSCOPIC HYSTERECTOMY WITH BILATERAL SALPINGO OOPHORECTOMY;  Surgeon: Homero Fellers, MD;  Location: [facility];  Service: Gynecology;  Laterality: Bilateral;  . TOTAL MASTECTOMY Right 01/14/2019   Procedure: RIGHT TOTAL MASTECTOMY;  Surgeon: Herbert Pun, MD;  Location: [facility];  Service: General;  Laterality: Right;  . WISDOM TOOTH EXTRACTION      SOCIAL HISTORY:  None   CURRENT MEDICATIONS:   Current Outpatient Medications on File Prior to Visit  Medication Sig Dispense Refill  . acetaminophen (TYLENOL) 500 MG tablet Take 2 tablets (1,000 mg total) by mouth every 6 (six) hours as needed for mild pain. 30 tablet 0  . anastrozole (ARIMIDEX) 1 MG tablet Take 1 tablet by mouth once daily 90 tablet 0  . cetirizine (ZYRTEC) 10 MG tablet Take 10 mg by mouth daily.    . diazepam (VALIUM) 2 MG tablet Take 1 tablet (2 mg total) by mouth every 12 (twelve) hours as needed for anxiety. 20 tablet 0  . DULoxetine (CYMBALTA) 60 MG capsule Take 1 capsule (60 mg total) by mouth daily. 90 capsule 3  . ibuprofen (ADVIL) 600 MG tablet Take 1 tablet (600 mg total) by mouth every 6 (six) hours as needed. (Patient not taking: Reported on 08/17/2019) 60 tablet 3  . lidocaine-prilocaine (EMLA) cream Apply 1 application topically as needed. Place a pea size amount to port 1-2 hours before chemotherapy. (Patient taking differently: Apply 1 application topically as needed (prior to port flushing). ) 30 g 3  . lisinopril-hydrochlorothiazide (ZESTORETIC) 20-25 MG tablet Take 1 tablet by mouth daily. 90 tablet 1  . omeprazole (PRILOSEC) 40 MG capsule Take 1 capsule by mouth once daily 90 capsule  3   No current facility-administered medications on file prior to visit.    ALLERGIES:  No Known Allergies  PHYSICAL EXAM:  Limited due to virtual platform  LABORATORY DATA:  Lab Results  Component Value Date   WBC 7.7 08/24/2019   HGB 14.5 08/24/2019   HCT 42.4 08/24/2019   MCV 88.7 08/24/2019   PLT 318 08/24/2019      Chemistry      Component Value Date/Time   NA 133 (L) 08/24/2019 1319   NA 139 11/23/2018 1020   K 3.4 (L) 08/24/2019 1319   CL 99 08/24/2019 1319   CO2 24 08/24/2019 1319   BUN 8 08/24/2019 1319   BUN 9 11/23/2018 1020   CREATININE 0.71 08/24/2019 1319      Component Value Date/Time   CALCIUM 9.5 08/24/2019 1319   ALKPHOS 102 08/24/2019 1319   AST 19 08/24/2019 1319   ALT 13  08/24/2019 1319   BILITOT 0.5 08/24/2019 1319   BILITOT 0.2 11/23/2018 1020      DIAGNOSTIC IMAGING:  12/03/19 BI-RADS CATEGORY  5: Highly suggestive of malignancy.  MUGA EF 59%  ASSESSMENT & PLAN:  Ms. Blake is a pleasant 54 y.o. female with history of stage I breast cancer ER/PR positive HER-2/neu negative hereditary BRCA2 mutation.  She is status post left prophylactic mastectomy and sentinel lymph node dissection which was negative for malignancy.  She completed 1 cycle of AC chemotherapy which was switched to docetaxel and carboplatin.  She completed a total of 4 cycles of docetaxel and carboplatin.  She sees Dr. Tedra Coupe him for reconstructive surgery of left breast.  Oncotype DX score risk of recurrence was 31.  She is on Arimidex and tolerating well.  DEXA currently on hold secondary to left breast reconstructive surgery.  Awaiting dental clearance for adjuvant Zometa. Patient presents to survivorship clinic today for survivorship care plan visit and to address any acute survivorship concerns since completing treatment.    1. History of stage I left breast cancer: Clinically, she is without evidence of disease recurrence based on physical exam/diagnostic imaging.  Today, she received a copy of her survivorship care plan (SCP) document, which was reviewed with her in detail.  The SCP details her cancer treatment history and potential late/long-term side effects of those treatments.  We discussed the follow-up schedule she can anticipate with interval imaging for surveillance of her cancer.  I have also shared a copy of her treatment summary/SCP with her PCP.  Cheryl Mooney will return to the survivorship clinic as needed; she will return to [place] at [facility] for surveillance visit with Dr. Tasia Catchings on 10/14/2019.  2. Problem at visit: Insomnia: Has tried OTC sleep aids without relief. She is having trouble falling and staying asleep.  Could try trazadone 50 mg at bedtime to see if her  sleep improves. Will touch base with Dr. Tasia Catchings to discuss.   3. Smoking cessation: I commended Cheryl Mooney's continued efforts to remain tobacco-free.  We discussed that one of the most important risk reduction strategies in preventing cancer recurrence in lung cancer patients is smoking cessation.  He is committed to abstaining from tobacco.  4. Physical activity/Healthy eating: Getting adequate physical activity and maintaining a healthy diet as a cancer survivor is important for overall wellness and reduces the risk of cancer recurrence. We discussed the CARE program which is a fitness program that is offered to cancer survivors free of charge.  We also reviewed the American Cancer Society's booklet  with recommendations for nutrition and physical activity.    4. Health promotion/Cancer screening:  Cheryl Mooney is reportedly up-to-date on her colonoscopy, pap smear, PSA tests, skin screenings, and vaccinations.  I encouraged her to talk with his PCP about arranging appropriate cancer screening tests, as appropriate.   5. Support services/Counseling: Cheryl Mooney was seen today in in effort to address both the physical and social concerns of our cancer survivors at [facility]. It is not uncommon for this period of the patient's cancer care trajectory to be one of many emotions and stressors.  I provided support today through active listening, validation of concerns, and expressive supportive counseling.  Cheryl Mooney was encouraged to take advantage of our support services programs and support groups to better cope in her new life as a cancer survivor after completing anti-cancer treatment.   NCCN Guidelines and Recommendations:  NCCN guideline recommendations for invasive breast cancer ER/PR positive HER-2/neu negative disease  1.  Interval history and physical exam 1-4 times per year as clinically appropriate for 5 years, then annually.  2.  Mammogram every 12 months.  3,  endocrine therapy: Assess and encourage adherence; patients taking tamoxifen should have annual gynecological  assessment.   4.  Patient is on an AI or those who experienced ovarian failure secondary to treatment should have monitoring of bone health with a bone mineral density at baseline and periodically thereafter.  Dispo:  -RTC for follow-up with oncologist Dr. Tasia Catchings on 10/14/2019. -RTC on 09/03/19 for f/u with Dr. Marla Roe her plastic surgeon. -RTC for follow-up with radiation oncology as scheduled -RX trazadone 50 mg daily at bedtime for sleep.    A total of 30 minutes was spent in face-to-face care of this patient, with greater than 50% of that time spent in counseling and care coordination.    Rulon Abide, AGNP-C [place] [facility] [place] (office) 08/26/19 9:58 AM

## 2019-09-03 ENCOUNTER — Ambulatory Visit (INDEPENDENT_AMBULATORY_CARE_PROVIDER_SITE_OTHER): Payer: BC Managed Care – PPO | Admitting: Plastic Surgery

## 2019-09-03 ENCOUNTER — Other Ambulatory Visit: Payer: Self-pay

## 2019-09-03 ENCOUNTER — Encounter: Payer: Self-pay | Admitting: Plastic Surgery

## 2019-09-03 VITALS — BP 148/95 | HR 77 | Temp 97.8°F | Ht 64.0 in | Wt 154.0 lb

## 2019-09-03 DIAGNOSIS — Z9013 Acquired absence of bilateral breasts and nipples: Secondary | ICD-10-CM

## 2019-09-03 NOTE — Progress Notes (Signed)
   Subjective:    Patient ID: Cheryl Mooney, female    DOB: 08/02/1965, 54 y.o.   MRN: [id number]  The patient is a 54 year old female here for follow-up on her breast reconstruction.  She has expanders in place.  She is tolerating the expansion extremely well.  There is no sign of seroma or hematoma.  Incisions are healing nicely.  She is pleased with her progress.  She is getting close to her desired size.     Review of Systems  Constitutional: Negative.   Respiratory: Negative.   Cardiovascular: Negative.   Gastrointestinal: Negative.        Objective:   Physical Exam Vitals and nursing note reviewed.  Constitutional:      Appearance: Normal appearance.  HENT:     Head: Normocephalic and atraumatic.  Cardiovascular:     Pulses: Normal pulses.  Pulmonary:     Effort: Pulmonary effort is normal.  Abdominal:     General: Abdomen is flat.  Neurological:     General: No focal deficit present.     Mental Status: She is alert. Mental status is at baseline.  Psychiatric:        Mood and Affect: Mood normal.        Thought Content: Thought content normal.         Assessment & Plan:     ICD-10-CM   1. Acquired absence of bilateral breasts and nipples  Z90.13   We are close to her desired size.  Plan for removal of bilateral expanders and placement of silicone implants.  We may get 1 more fill.  We placed injectable saline in the Expander using a sterile technique: Right: 50 cc for a total of 455 / 535 cc Left: 50 cc for a total of 455 / 535 cc

## 2019-09-22 ENCOUNTER — Encounter: Payer: Self-pay | Admitting: Plastic Surgery

## 2019-09-22 ENCOUNTER — Other Ambulatory Visit: Payer: Self-pay

## 2019-09-22 ENCOUNTER — Ambulatory Visit (INDEPENDENT_AMBULATORY_CARE_PROVIDER_SITE_OTHER): Payer: BC Managed Care – PPO | Admitting: Plastic Surgery

## 2019-09-22 VITALS — BP 149/92 | HR 86 | Temp 97.7°F

## 2019-09-22 DIAGNOSIS — Z170 Estrogen receptor positive status [ER+]: Secondary | ICD-10-CM

## 2019-09-22 DIAGNOSIS — Z9013 Acquired absence of bilateral breasts and nipples: Secondary | ICD-10-CM

## 2019-09-22 DIAGNOSIS — C50511 Malignant neoplasm of lower-outer quadrant of right female breast: Secondary | ICD-10-CM

## 2019-09-22 MED ORDER — HYDROCODONE-ACETAMINOPHEN 5-325 MG PO TABS: 1.0000 | ORAL_TABLET | Freq: Three times a day (TID) | ORAL | 0 refills | Status: AC | PRN

## 2019-09-22 MED ORDER — CEPHALEXIN 500 MG PO CAPS: 500.0000 mg | ORAL_CAPSULE | Freq: Four times a day (QID) | ORAL | 0 refills | Status: AC

## 2019-09-22 NOTE — H&P (View-Only) (Signed)
ICD-10-CM   1. Acquired absence of bilateral breasts and nipples  Z90.13   2. Malignant neoplasm of lower-outer quadrant of right breast of female, estrogen receptor positive ([place])  C50.511    Z17.0       Patient ID: Cheryl Mooney, female    DOB: 06-01-1965, 54 y.o.   MRN: [id number]   History of Present Illness: Cheryl Mooney is a 54 y.o.  female  with a history of bilateral mastectomies due to right sided breast cancer.  She presents for preoperative evaluation for upcoming procedure, bilateral removal of tissue expanders and placement of silicone implants, scheduled for 10/13/2019 with Dr. Marla Roe.  Patient underwent bilateral mastectomies with immediate first stage reconstruction.  She has been tolerating her fills well.  At this time she is ready to move forward with exchange of tissue expanders for silicone implants. She would like to leave port in place for now as she received treatment with them currently.  We placed injectable saline in the Expander using a sterile technique: Right: 30 cc for a total of 485 / 535 cc Left: 0 cc for a total of 455 / 535 cc  PMH Significant for: Breast cancer (right side), bilateral mastectomies, depression, HTN, GERD  The patient has not had problems with anesthesia.  Past Medical History: Allergies: No Known Allergies  Current Medications:  Current Outpatient Medications:  .  acetaminophen (TYLENOL) 500 MG tablet, Take 2 tablets (1,000 mg total) by mouth every 6 (six) hours as needed for mild pain., Disp: 30 tablet, Rfl: 0 .  anastrozole (ARIMIDEX) 1 MG tablet, Take 1 tablet by mouth once daily, Disp: 90 tablet, Rfl: 0 .  cetirizine (ZYRTEC) 10 MG tablet, Take 10 mg by mouth daily., Disp: , Rfl:  .  diazepam (VALIUM) 2 MG tablet, Take 1 tablet (2 mg total) by mouth every 12 (twelve) hours as needed for anxiety., Disp: 20 tablet, Rfl: 0 .  DULoxetine (CYMBALTA) 60 MG capsule, Take 1 capsule (60 mg total) by mouth daily., Disp: 90  capsule, Rfl: 3 .  lidocaine-prilocaine (EMLA) cream, Apply 1 application topically as needed. Place a pea size amount to port 1-2 hours before chemotherapy. (Patient taking differently: Apply 1 application topically as needed (prior to port flushing). ), Disp: 30 g, Rfl: 3 .  lisinopril-hydrochlorothiazide (ZESTORETIC) 20-25 MG tablet, Take 1 tablet by mouth daily., Disp: 90 tablet, Rfl: 1 .  omeprazole (PRILOSEC) 40 MG capsule, Take 1 capsule by mouth once daily, Disp: 90 capsule, Rfl: 3 .  traZODone (DESYREL) 50 MG tablet, Take 1 tablet (50 mg total) by mouth at bedtime. (Patient not taking: Reported on 09/22/2019), Disp: 30 tablet, Rfl: 0  Past Medical Problems: Past Medical History:  Diagnosis Date  . Anemia    things seem back to normal  . Benign neoplasm of sigmoid colon   . Benign neoplasm of transverse colon   . Breast cancer ([place])   . Depression   . Family history of BRCA2 gene positive   . Family history of breast cancer   . Family history of colon cancer   . Family history of lung cancer   . Family history of pancreatic cancer   . GERD (gastroesophageal reflux disease)   . History of kidney stones   . Hypertension   . Malignant neoplasm of lower-outer quadrant of right breast of female, estrogen receptor positive ([place]) 12/17/2018  . Smoker     Past Surgical History: Past Surgical History:  Procedure Laterality  Date  . AXILLARY SENTINEL NODE BIOPSY Bilateral 01/14/2019   Procedure: AXILLARY SENTINEL NODE BIOPSY;  Surgeon: Herbert Pun, MD;  Location: [facility];  Service: General;  Laterality: Bilateral;  . BREAST BIOPSY Right 12/08/2018   [place] bx of mass with calcs path pending  . BREAST BIOPSY Right 12/08/2018   [place] bx of LN, path pending  . BREAST RECONSTRUCTION WITH PLACEMENT OF TISSUE EXPANDER AND ALLODERM Bilateral 01/14/2019   Procedure: BILATERAL BREAST RECONSTRUCTION WITH PLACEMENT OF TISSUE EXPANDER AND FLEX HD;  Surgeon: Wallace Going, DO;  Location:  [facility];  Service: Plastics;  Laterality: Bilateral;  . COLONOSCOPY WITH PROPOFOL N/A 02/17/2015   Procedure: COLONOSCOPY WITH PROPOFOL;  Surgeon: Lucilla Lame, MD;  Location: [place];  Service: Endoscopy;  Laterality: N/A;  . COLONOSCOPY WITH PROPOFOL N/A 07/12/2019   Procedure: COLONOSCOPY WITH BIOPSY;  Surgeon: Lucilla Lame, MD;  Location: [place];  Service: Endoscopy;  Laterality: N/A;  priority 3 PORT A CATH NEEDS ACCESSED  . CYSTOSCOPY N/A 08/05/2019   Procedure: CYSTOSCOPY;  Surgeon: Homero Fellers, MD;  Location: [facility];  Service: Gynecology;  Laterality: N/A;  . POLYPECTOMY  02/17/2015   Procedure: POLYPECTOMY;  Surgeon: Lucilla Lame, MD;  Location: [place];  Service: Endoscopy;;  . POLYPECTOMY N/A 07/12/2019   Procedure: POLYPECTOMY;  Surgeon: Lucilla Lame, MD;  Location: [place];  Service: Endoscopy;  Laterality: N/A;  . PORTACATH PLACEMENT Left 02/18/2019   Procedure: INSERTION PORT-A-CATH;  Surgeon: Herbert Pun, MD;  Location: [facility];  Service: General;  Laterality: Left;  . SIMPLE MASTECTOMY WITH AXILLARY SENTINEL NODE BIOPSY Left 01/14/2019   Procedure: LEFT SIMPLE MASTECTOMY;  Surgeon: Herbert Pun, MD;  Location: [facility];  Service: General;  Laterality: Left;  . TOTAL LAPAROSCOPIC HYSTERECTOMY WITH BILATERAL SALPINGO OOPHORECTOMY Bilateral 08/05/2019   Procedure: TOTAL LAPAROSCOPIC HYSTERECTOMY WITH BILATERAL SALPINGO OOPHORECTOMY;  Surgeon: Homero Fellers, MD;  Location: [facility];  Service: Gynecology;  Laterality: Bilateral;  . TOTAL MASTECTOMY Right 01/14/2019   Procedure: RIGHT TOTAL MASTECTOMY;  Surgeon: Herbert Pun, MD;  Location: [facility];  Service: General;  Laterality: Right;  . WISDOM TOOTH EXTRACTION      Social History: Social History   Socioeconomic History  . Marital status: Married    Spouse name: Jenny Reichmann  . Number of children: Not on file  . Years of education: Not on  file  . Highest education level: Not on file  Occupational History  . Occupation: Physicist, medical    Comment: retired  Tobacco Use  . Smoking status: Current Some Day Smoker    Packs/day: 0.05    Years: 20.00    Pack years: 1.00    Types: Cigarettes    Last attempt to quit: 01/07/2019    Years since quitting: 0.7  . Smokeless tobacco: Never Used  . Tobacco comment: 4 cigs a day.   Vaping Use  . Vaping Use: Never used  Substance and Sexual Activity  . Alcohol use: Yes    Alcohol/week: 2.0 standard drinks    Types: 2 Standard drinks or equivalent per week    Comment: occasional  . Drug use: No  . Sexual activity: Yes    Birth control/protection: Post-menopausal  Other Topics Concern  . Not on file  Social History Narrative   Patient lives with husband.   Social Determinants of Health   Financial Resource Strain:   . Difficulty of Paying Living Expenses:   Food Insecurity:   . Worried About Running  Out of Food in the Last Year:   . [address] in the Last Year:   Transportation Needs:   . Lack of Transportation (Medical):   Marland Kitchen Lack of Transportation (Non-Medical):   Physical Activity:   . Days of Exercise per Week:   . Minutes of Exercise per Session:   Stress:   . Feeling of Stress :   Social Connections:   . Frequency of Communication with Friends and Family:   . Frequency of Social Gatherings with Friends and Family:   . Attends Religious Services:   . Active Member of Clubs or Organizations:   . Attends Archivist Meetings:   Marland Kitchen Marital Status:   Intimate Partner Violence:   . Fear of Current or Ex-Partner:   . Emotionally Abused:   Marland Kitchen Physically Abused:   . Sexually Abused:     Family History: Family History  Problem Relation Age of Onset  . Colon cancer Mother 57  . Breast cancer Mother 61       "BRCA2+"  . Hypertension Father   . Stroke Father   . Alzheimer's disease Father   . Prostate cancer Father   . Breast cancer Maternal Aunt         mat great aunt  . Breast cancer Maternal Grandmother 30  . Pancreatic cancer Maternal Grandfather 60    Review of Systems: Review of Systems  Constitutional: Negative for chills and fever.  HENT: Negative for congestion and sore throat.   Respiratory: Negative for cough and shortness of breath.   Cardiovascular: Negative for chest pain and palpitations.  Gastrointestinal: Negative for abdominal pain, nausea and vomiting.  Musculoskeletal: Negative for back pain, joint pain, myalgias and neck pain.  Skin: Negative for itching and rash.    Physical Exam: Vital Signs BP (!) 149/92 (BP Location: Left Arm, Patient Position: Sitting, Cuff Size: Normal)   Pulse 86   Temp 97.7 F (36.5 C) (Temporal)   SpO2 97%  Physical Exam Constitutional:      Appearance: Normal appearance. She is normal weight.  HENT:     Head: Normocephalic and atraumatic.  Eyes:     Extraocular Movements: Extraocular movements intact.  Cardiovascular:     Rate and Rhythm: Normal rate and regular rhythm.     Pulses: Normal pulses.     Heart sounds: Normal heart sounds.  Pulmonary:     Effort: Pulmonary effort is normal.     Breath sounds: Normal breath sounds. No wheezing, rhonchi or rales.  Chest:       Comments: Bilateral mastectomies. Incisions healing well, c/d/i. No signs of infection. Breasts sit a bit lateral due to muscle.  Abdominal:     General: Bowel sounds are normal.     Palpations: Abdomen is soft.  Musculoskeletal:        General: No swelling. Normal range of motion.     Cervical back: Normal range of motion.  Skin:    General: Skin is warm and dry.     Coloration: Skin is not pale.     Findings: No erythema or rash.  Neurological:     General: No focal deficit present.     Mental Status: She is alert and oriented to person, place, and time.  Psychiatric:        Mood and Affect: Mood normal.        Behavior: Behavior normal.        Thought Content: Thought content normal.  Judgment: Judgment normal.     Assessment/Plan:  Ms. Lorenz scheduled for bilateral removal of breast tissue expanders and placement of implants with Dr. Marla Roe.  Risks, benefits, and alternatives of procedure discussed, questions answered and consent obtained.    Smoking Status: Smokes a cigarette once in a while due to stress; Counseling Given? Yes - Patient reports they quit before and plan to quit again immediately to help improve wound healing outcomes. Last Mammogram: N/A bilateral mastectomies  Caprini Score: 8 High; Risk Factors include: 53 yr-old female, BMI > 25, Port, hx cancer, and length of planned surgery. Recommendation for mechanical and pharmacological prophylaxis during surgery. Encourage early ambulation.   Pictures obtained: 07/30/19  Post-op Rx sent to pharmacy: Norco, Keflex  Patient was provided with the General Surgical Risk consent document and Pain Medication Agreement prior to their appointment.  They had adequate time to read through the risk consent documents and Pain Medication Agreement. We also discussed them in person together during this preop appointment. All of their questions were answered to their satisfaction.  Recommended calling if they have any further questions.  Risk consent form and Pain Medication Agreement to be scanned into patient's chart.  The [place] was signed into law in 2016 which includes the topic of electronic health records.  This provides immediate access to information in MyChart.  This includes consultation notes, operative notes, office notes, lab results and pathology reports.  If you have any questions about what you read please let us know at your next visit or call us at the office.  We are right here with you.   Electronically signed by: Threasa Heads, PA-C 09/22/2019 4:40 PM

## 2019-09-22 NOTE — Progress Notes (Signed)
ICD-10-CM   1. Acquired absence of bilateral breasts and nipples  Z90.13   2. Malignant neoplasm of lower-outer quadrant of right breast of female, estrogen receptor positive ([place])  C50.511    Z17.0       Patient ID: Cheryl Mooney, female    DOB: 06-01-1965, 54 y.o.   MRN: [id number]   History of Present Illness: Cheryl Mooney is a 54 y.o.  female  with a history of bilateral mastectomies due to right sided breast cancer.  She presents for preoperative evaluation for upcoming procedure, bilateral removal of tissue expanders and placement of silicone implants, scheduled for 10/13/2019 with Dr. Marla Roe.  Patient underwent bilateral mastectomies with immediate first stage reconstruction.  She has been tolerating her fills well.  At this time she is ready to move forward with exchange of tissue expanders for silicone implants. She would like to leave port in place for now as she received treatment with them currently.  We placed injectable saline in the Expander using a sterile technique: Right: 30 cc for a total of 485 / 535 cc Left: 0 cc for a total of 455 / 535 cc  PMH Significant for: Breast cancer (right side), bilateral mastectomies, depression, HTN, GERD  The patient has not had problems with anesthesia.  Past Medical History: Allergies: No Known Allergies  Current Medications:  Current Outpatient Medications:  .  acetaminophen (TYLENOL) 500 MG tablet, Take 2 tablets (1,000 mg total) by mouth every 6 (six) hours as needed for mild pain., Disp: 30 tablet, Rfl: 0 .  anastrozole (ARIMIDEX) 1 MG tablet, Take 1 tablet by mouth once daily, Disp: 90 tablet, Rfl: 0 .  cetirizine (ZYRTEC) 10 MG tablet, Take 10 mg by mouth daily., Disp: , Rfl:  .  diazepam (VALIUM) 2 MG tablet, Take 1 tablet (2 mg total) by mouth every 12 (twelve) hours as needed for anxiety., Disp: 20 tablet, Rfl: 0 .  DULoxetine (CYMBALTA) 60 MG capsule, Take 1 capsule (60 mg total) by mouth daily., Disp: 90  capsule, Rfl: 3 .  lidocaine-prilocaine (EMLA) cream, Apply 1 application topically as needed. Place a pea size amount to port 1-2 hours before chemotherapy. (Patient taking differently: Apply 1 application topically as needed (prior to port flushing). ), Disp: 30 g, Rfl: 3 .  lisinopril-hydrochlorothiazide (ZESTORETIC) 20-25 MG tablet, Take 1 tablet by mouth daily., Disp: 90 tablet, Rfl: 1 .  omeprazole (PRILOSEC) 40 MG capsule, Take 1 capsule by mouth once daily, Disp: 90 capsule, Rfl: 3 .  traZODone (DESYREL) 50 MG tablet, Take 1 tablet (50 mg total) by mouth at bedtime. (Patient not taking: Reported on 09/22/2019), Disp: 30 tablet, Rfl: 0  Past Medical Problems: Past Medical History:  Diagnosis Date  . Anemia    things seem back to normal  . Benign neoplasm of sigmoid colon   . Benign neoplasm of transverse colon   . Breast cancer ([place])   . Depression   . Family history of BRCA2 gene positive   . Family history of breast cancer   . Family history of colon cancer   . Family history of lung cancer   . Family history of pancreatic cancer   . GERD (gastroesophageal reflux disease)   . History of kidney stones   . Hypertension   . Malignant neoplasm of lower-outer quadrant of right breast of female, estrogen receptor positive ([place]) 12/17/2018  . Smoker     Past Surgical History: Past Surgical History:  Procedure Laterality  Date  . AXILLARY SENTINEL NODE BIOPSY Bilateral 01/14/2019   Procedure: AXILLARY SENTINEL NODE BIOPSY;  Surgeon: Herbert Pun, MD;  Location: [facility];  Service: General;  Laterality: Bilateral;  . BREAST BIOPSY Right 12/08/2018   [place] bx of mass with calcs path pending  . BREAST BIOPSY Right 12/08/2018   [place] bx of LN, path pending  . BREAST RECONSTRUCTION WITH PLACEMENT OF TISSUE EXPANDER AND ALLODERM Bilateral 01/14/2019   Procedure: BILATERAL BREAST RECONSTRUCTION WITH PLACEMENT OF TISSUE EXPANDER AND FLEX HD;  Surgeon: Wallace Going, DO;  Location:  [facility];  Service: Plastics;  Laterality: Bilateral;  . COLONOSCOPY WITH PROPOFOL N/A 02/17/2015   Procedure: COLONOSCOPY WITH PROPOFOL;  Surgeon: Lucilla Lame, MD;  Location: [place];  Service: Endoscopy;  Laterality: N/A;  . COLONOSCOPY WITH PROPOFOL N/A 07/12/2019   Procedure: COLONOSCOPY WITH BIOPSY;  Surgeon: Lucilla Lame, MD;  Location: [place];  Service: Endoscopy;  Laterality: N/A;  priority 3 PORT A CATH NEEDS ACCESSED  . CYSTOSCOPY N/A 08/05/2019   Procedure: CYSTOSCOPY;  Surgeon: Homero Fellers, MD;  Location: [facility];  Service: Gynecology;  Laterality: N/A;  . POLYPECTOMY  02/17/2015   Procedure: POLYPECTOMY;  Surgeon: Lucilla Lame, MD;  Location: [place];  Service: Endoscopy;;  . POLYPECTOMY N/A 07/12/2019   Procedure: POLYPECTOMY;  Surgeon: Lucilla Lame, MD;  Location: [place];  Service: Endoscopy;  Laterality: N/A;  . PORTACATH PLACEMENT Left 02/18/2019   Procedure: INSERTION PORT-A-CATH;  Surgeon: Herbert Pun, MD;  Location: [facility];  Service: General;  Laterality: Left;  . SIMPLE MASTECTOMY WITH AXILLARY SENTINEL NODE BIOPSY Left 01/14/2019   Procedure: LEFT SIMPLE MASTECTOMY;  Surgeon: Herbert Pun, MD;  Location: [facility];  Service: General;  Laterality: Left;  . TOTAL LAPAROSCOPIC HYSTERECTOMY WITH BILATERAL SALPINGO OOPHORECTOMY Bilateral 08/05/2019   Procedure: TOTAL LAPAROSCOPIC HYSTERECTOMY WITH BILATERAL SALPINGO OOPHORECTOMY;  Surgeon: Homero Fellers, MD;  Location: [facility];  Service: Gynecology;  Laterality: Bilateral;  . TOTAL MASTECTOMY Right 01/14/2019   Procedure: RIGHT TOTAL MASTECTOMY;  Surgeon: Herbert Pun, MD;  Location: [facility];  Service: General;  Laterality: Right;  . WISDOM TOOTH EXTRACTION      Social History: Social History   Socioeconomic History  . Marital status: Married    Spouse name: Jenny Reichmann  . Number of children: Not on file  . Years of education: Not on  file  . Highest education level: Not on file  Occupational History  . Occupation: Physicist, medical    Comment: retired  Tobacco Use  . Smoking status: Current Some Day Smoker    Packs/day: 0.05    Years: 20.00    Pack years: 1.00    Types: Cigarettes    Last attempt to quit: 01/07/2019    Years since quitting: 0.7  . Smokeless tobacco: Never Used  . Tobacco comment: 4 cigs a day.   Vaping Use  . Vaping Use: Never used  Substance and Sexual Activity  . Alcohol use: Yes    Alcohol/week: 2.0 standard drinks    Types: 2 Standard drinks or equivalent per week    Comment: occasional  . Drug use: No  . Sexual activity: Yes    Birth control/protection: Post-menopausal  Other Topics Concern  . Not on file  Social History Narrative   Patient lives with husband.   Social Determinants of Health   Financial Resource Strain:   . Difficulty of Paying Living Expenses:   Food Insecurity:   . Worried About Running  Out of Food in the Last Year:   . [place] [address] in the Last Year:   Transportation Needs:   . Lack of Transportation (Medical):   Marland Kitchen Lack of Transportation (Non-Medical):   Physical Activity:   . Days of Exercise per Week:   . Minutes of Exercise per Session:   Stress:   . Feeling of Stress :   Social Connections:   . Frequency of Communication with Friends and Family:   . Frequency of Social Gatherings with Friends and Family:   . Attends Religious Services:   . Active Member of Clubs or Organizations:   . Attends Archivist Meetings:   Marland Kitchen Marital Status:   Intimate Partner Violence:   . Fear of Current or Ex-Partner:   . Emotionally Abused:   Marland Kitchen Physically Abused:   . Sexually Abused:     Family History: Family History  Problem Relation Age of Onset  . Colon cancer Mother 57  . Breast cancer Mother 61       "BRCA2+"  . Hypertension Father   . Stroke Father   . Alzheimer's disease Father   . Prostate cancer Father   . Breast cancer Maternal Aunt         mat great aunt  . Breast cancer Maternal Grandmother 30  . Pancreatic cancer Maternal Grandfather 60    Review of Systems: Review of Systems  Constitutional: Negative for chills and fever.  HENT: Negative for congestion and sore throat.   Respiratory: Negative for cough and shortness of breath.   Cardiovascular: Negative for chest pain and palpitations.  Gastrointestinal: Negative for abdominal pain, nausea and vomiting.  Musculoskeletal: Negative for back pain, joint pain, myalgias and neck pain.  Skin: Negative for itching and rash.    Physical Exam: Vital Signs BP (!) 149/92 (BP Location: Left Arm, Patient Position: Sitting, Cuff Size: Normal)   Pulse 86   Temp 97.7 F (36.5 C) (Temporal)   SpO2 97%  Physical Exam Constitutional:      Appearance: Normal appearance. She is normal weight.  HENT:     Head: Normocephalic and atraumatic.  Eyes:     Extraocular Movements: Extraocular movements intact.  Cardiovascular:     Rate and Rhythm: Normal rate and regular rhythm.     Pulses: Normal pulses.     Heart sounds: Normal heart sounds.  Pulmonary:     Effort: Pulmonary effort is normal.     Breath sounds: Normal breath sounds. No wheezing, rhonchi or rales.  Chest:       Comments: Bilateral mastectomies. Incisions healing well, c/d/i. No signs of infection. Breasts sit a bit lateral due to muscle.  Abdominal:     General: Bowel sounds are normal.     Palpations: Abdomen is soft.  Musculoskeletal:        General: No swelling. Normal range of motion.     Cervical back: Normal range of motion.  Skin:    General: Skin is warm and dry.     Coloration: Skin is not pale.     Findings: No erythema or rash.  Neurological:     General: No focal deficit present.     Mental Status: She is alert and oriented to person, place, and time.  Psychiatric:        Mood and Affect: Mood normal.        Behavior: Behavior normal.        Thought Content: Thought content normal.  Judgment: Judgment normal.     Assessment/Plan:  Ms. Lorenz scheduled for bilateral removal of breast tissue expanders and placement of implants with Dr. Marla Roe.  Risks, benefits, and alternatives of procedure discussed, questions answered and consent obtained.    Smoking Status: Smokes a cigarette once in a while due to stress; Counseling Given? Yes - Patient reports they quit before and plan to quit again immediately to help improve wound healing outcomes. Last Mammogram: N/A bilateral mastectomies  Caprini Score: 8 High; Risk Factors include: 53 yr-old female, BMI > 25, Port, hx cancer, and length of planned surgery. Recommendation for mechanical and pharmacological prophylaxis during surgery. Encourage early ambulation.   Pictures obtained: 07/30/19  Post-op Rx sent to pharmacy: Norco, Keflex  Patient was provided with the General Surgical Risk consent document and Pain Medication Agreement prior to their appointment.  They had adequate time to read through the risk consent documents and Pain Medication Agreement. We also discussed them in person together during this preop appointment. All of their questions were answered to their satisfaction.  Recommended calling if they have any further questions.  Risk consent form and Pain Medication Agreement to be scanned into patient's chart.  The [place] was signed into law in 2016 which includes the topic of electronic health records.  This provides immediate access to information in MyChart.  This includes consultation notes, operative notes, office notes, lab results and pathology reports.  If you have any questions about what you read please let us know at your next visit or call us at the office.  We are right here with you.   Electronically signed by: Threasa Heads, PA-C 09/22/2019 4:40 PM

## 2019-09-23 ENCOUNTER — Ambulatory Visit: Payer: BC Managed Care – PPO | Admitting: Plastic Surgery

## 2019-09-27 ENCOUNTER — Telehealth: Payer: Self-pay | Admitting: Oncology

## 2019-09-27 ENCOUNTER — Other Ambulatory Visit: Payer: Self-pay

## 2019-09-27 ENCOUNTER — Inpatient Hospital Stay: Payer: BC Managed Care – PPO | Attending: Oncology

## 2019-09-27 DIAGNOSIS — Z800 Family history of malignant neoplasm of digestive organs: Secondary | ICD-10-CM | POA: Insufficient documentation

## 2019-09-27 DIAGNOSIS — Z818 Family history of other mental and behavioral disorders: Secondary | ICD-10-CM | POA: Diagnosis not present

## 2019-09-27 DIAGNOSIS — Z86010 Personal history of colonic polyps: Secondary | ICD-10-CM | POA: Insufficient documentation

## 2019-09-27 DIAGNOSIS — I10 Essential (primary) hypertension: Secondary | ICD-10-CM | POA: Insufficient documentation

## 2019-09-27 DIAGNOSIS — Z79811 Long term (current) use of aromatase inhibitors: Secondary | ICD-10-CM | POA: Diagnosis not present

## 2019-09-27 DIAGNOSIS — Z823 Family history of stroke: Secondary | ICD-10-CM | POA: Diagnosis not present

## 2019-09-27 DIAGNOSIS — F17210 Nicotine dependence, cigarettes, uncomplicated: Secondary | ICD-10-CM | POA: Diagnosis not present

## 2019-09-27 DIAGNOSIS — Z90722 Acquired absence of ovaries, bilateral: Secondary | ICD-10-CM | POA: Insufficient documentation

## 2019-09-27 DIAGNOSIS — Z8249 Family history of ischemic heart disease and other diseases of the circulatory system: Secondary | ICD-10-CM | POA: Insufficient documentation

## 2019-09-27 DIAGNOSIS — Z801 Family history of malignant neoplasm of trachea, bronchus and lung: Secondary | ICD-10-CM | POA: Diagnosis not present

## 2019-09-27 DIAGNOSIS — R1011 Right upper quadrant pain: Secondary | ICD-10-CM | POA: Diagnosis not present

## 2019-09-27 DIAGNOSIS — Z803 Family history of malignant neoplasm of breast: Secondary | ICD-10-CM | POA: Insufficient documentation

## 2019-09-27 DIAGNOSIS — Z79899 Other long term (current) drug therapy: Secondary | ICD-10-CM | POA: Insufficient documentation

## 2019-09-27 DIAGNOSIS — Z170 Estrogen receptor positive status [ER+]: Secondary | ICD-10-CM | POA: Diagnosis not present

## 2019-09-27 DIAGNOSIS — C50511 Malignant neoplasm of lower-outer quadrant of right female breast: Secondary | ICD-10-CM | POA: Diagnosis not present

## 2019-09-27 DIAGNOSIS — R232 Flushing: Secondary | ICD-10-CM | POA: Diagnosis not present

## 2019-09-27 DIAGNOSIS — Z90710 Acquired absence of both cervix and uterus: Secondary | ICD-10-CM | POA: Insufficient documentation

## 2019-09-27 DIAGNOSIS — Z95828 Presence of other vascular implants and grafts: Secondary | ICD-10-CM

## 2019-09-27 MED ORDER — HEPARIN SOD (PORK) LOCK FLUSH 100 UNIT/ML IV SOLN
INTRAVENOUS | Status: AC
  Filled 2019-09-27: qty 5

## 2019-09-27 MED ORDER — SODIUM CHLORIDE 0.9% FLUSH
10.0000 mL | Freq: Once | INTRAVENOUS | Status: AC
  Administered 2019-09-27: 10 mL via INTRAVENOUS
  Filled 2019-09-27: qty 10

## 2019-09-27 MED ORDER — HEPARIN SOD (PORK) LOCK FLUSH 100 UNIT/ML IV SOLN
500.0000 [IU] | Freq: Once | INTRAVENOUS | Status: AC
  Administered 2019-09-27: 500 [IU] via INTRAVENOUS
  Filled 2019-09-27: qty 5

## 2019-09-27 NOTE — Telephone Encounter (Signed)
Patient spoke with Probation officer and stated that patient has surgery on 10-13-19 and asked for her appt to be moved sooner. Appts rescheduled for 10-07-19.

## 2019-10-05 ENCOUNTER — Encounter (HOSPITAL_BASED_OUTPATIENT_CLINIC_OR_DEPARTMENT_OTHER): Payer: Self-pay | Admitting: Plastic Surgery

## 2019-10-05 ENCOUNTER — Other Ambulatory Visit: Payer: Self-pay

## 2019-10-06 ENCOUNTER — Encounter: Payer: Self-pay | Admitting: Oncology

## 2019-10-06 ENCOUNTER — Inpatient Hospital Stay (HOSPITAL_BASED_OUTPATIENT_CLINIC_OR_DEPARTMENT_OTHER): Payer: BC Managed Care – PPO | Admitting: Oncology

## 2019-10-06 ENCOUNTER — Inpatient Hospital Stay: Payer: BC Managed Care – PPO

## 2019-10-06 VITALS — BP 111/79 | HR 81 | Temp 96.8°F | Resp 18 | Wt 153.3 lb

## 2019-10-06 DIAGNOSIS — C50511 Malignant neoplasm of lower-outer quadrant of right female breast: Secondary | ICD-10-CM

## 2019-10-06 DIAGNOSIS — Z1501 Genetic susceptibility to malignant neoplasm of breast: Secondary | ICD-10-CM

## 2019-10-06 DIAGNOSIS — Z170 Estrogen receptor positive status [ER+]: Secondary | ICD-10-CM

## 2019-10-06 DIAGNOSIS — Z1502 Genetic susceptibility to malignant neoplasm of ovary: Secondary | ICD-10-CM

## 2019-10-06 DIAGNOSIS — C50919 Malignant neoplasm of unspecified site of unspecified female breast: Secondary | ICD-10-CM

## 2019-10-06 DIAGNOSIS — Z79811 Long term (current) use of aromatase inhibitors: Secondary | ICD-10-CM

## 2019-10-06 DIAGNOSIS — Z95828 Presence of other vascular implants and grafts: Secondary | ICD-10-CM | POA: Diagnosis not present

## 2019-10-06 DIAGNOSIS — Z1509 Genetic susceptibility to other malignant neoplasm: Secondary | ICD-10-CM

## 2019-10-06 LAB — COMPREHENSIVE METABOLIC PANEL
ALT: 13 U/L (ref 0–44)
AST: 19 U/L (ref 15–41)
Albumin: 4.3 g/dL (ref 3.5–5.0)
Alkaline Phosphatase: 82 U/L (ref 38–126)
Anion gap: 11 (ref 5–15)
BUN: 8 mg/dL (ref 6–20)
CO2: 24 mmol/L (ref 22–32)
Calcium: 9.2 mg/dL (ref 8.9–10.3)
Chloride: 100 mmol/L (ref 98–111)
Creatinine, Ser: 0.61 mg/dL (ref 0.44–1.00)
GFR calc Af Amer: >60 mL/min (ref >60)
GFR calc non Af Amer: >60 mL/min (ref >60)
Glucose, Bld: 147 mg/dL — ABNORMAL HIGH (ref 70–99)
Potassium: 3.5 mmol/L (ref 3.5–5.1)
Sodium: 135 mmol/L (ref 135–145)
Total Bilirubin: 0.7 mg/dL (ref 0.3–1.2)
Total Protein: 7.6 g/dL (ref 6.5–8.1)

## 2019-10-06 LAB — CBC WITH DIFFERENTIAL/PLATELET
Abs Immature Granulocytes: 0.01 10*3/uL (ref 0.00–0.07)
Basophils Absolute: 0.1 10*3/uL (ref 0.0–0.1)
Basophils Relative: 1 %
Eosinophils Absolute: 0.2 10*3/uL (ref 0.0–0.5)
Eosinophils Relative: 2 %
HCT: 40.9 % (ref 36.0–46.0)
Hemoglobin: 13.9 g/dL (ref 12.0–15.0)
Immature Granulocytes: 0 %
Lymphocytes Relative: 36 %
Lymphs Abs: 2.4 10*3/uL (ref 0.7–4.0)
MCH: 29.4 pg (ref 26.0–34.0)
MCHC: 34 g/dL (ref 30.0–36.0)
MCV: 86.7 fL (ref 80.0–100.0)
Monocytes Absolute: 0.4 10*3/uL (ref 0.1–1.0)
Monocytes Relative: 7 %
Neutro Abs: 3.6 10*3/uL (ref 1.7–7.7)
Neutrophils Relative %: 54 %
Platelets: 300 10*3/uL (ref 150–400)
RBC: 4.72 MIL/uL (ref 3.87–5.11)
RDW: 14.6 % (ref 11.5–15.5)
WBC: 6.7 10*3/uL (ref 4.0–10.5)
nRBC: 0 % (ref 0.0–0.2)

## 2019-10-06 NOTE — Progress Notes (Signed)
Pt here for follow up. Pt will have breast reconstruction on 7/7 and is considering port removal at that time.

## 2019-10-06 NOTE — Progress Notes (Signed)

## 2019-10-06 NOTE — Progress Notes (Signed)
Hematology/Oncology follow up  note [facility] [phone] [phone]   Patient Care Team: Glean Hess, MD as PCP - General (Internal Medicine) Jannet Mantis, MD (Dermatology) Rico Junker, RN as Registered Nurse Herbert Pun, MD as Consulting Physician (General Surgery) Earlie Server, MD as Consulting Physician (Oncology) Dillingham, Loel Lofty, DO as Attending Physician (Plastic Surgery)  CHIEF COMPLAINTS/REASON FOR VISIT:  Follow-up for breast cancer.  HISTORY OF PRESENTING ILLNESS:  YAMARI VENTOLA is a  54 y.o.  female with PMH listed below who was referred to me for evaluation of breast cancer Patient reports feeling mass of her right breast week before her annual mammogram. 12/03/2018 patient had bilateral diagnostic mammogram and ultrasound  which showed suspicious 2.1 x 1.3 x 1.6 irregular mass at 7:00 in the right breast, 2 cm from the nipple. 2 borderline lymph nodes are seen in the right axilla.  1 of the nodes demonstrate a cortex of 4.4 mm.  Both lymph nodes demonstrated retention of fatty hila. Patient underwent ultrasound-guided biopsy of right breast mass as well as ultrasound-guided biopsy of 1 of the 2 mildly abnormal right axillary lymph nodes. 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 Right axilla lymph node negative for malignancy. ER> 90% positive, PR11-50% positive, HER-2 equivocal, 2+ by IHC.  FISH negative.   Nipple discharge: Denies Family history: Mother diagnosed with breast cancer at age of 40, and colon cancer at age of 82.  Mother has BRCA2 mutation.   Maternal grandmother breast cancer, maternal great aunt breast cancer.  Patient recalls that she was tested long time ago and was not aware of any results.  OCP use: In her 20s-30s Estrogen and progesterone therapy: denies History of radiation to chest: denies.    Previous breast surgery: Denies  BRCA 2 positive.  # 01/14/2019 patient underwent bilateral mastectomy with sentinel lymph node biopsy of left and the right axillary. Right breast showed invasive mammary carcinoma, DCIS positive, apocrine metaplastic, stromal fibrosis, duct ectasia, simple cyst formation, usual epithelial hyperplasia.  Sentinel lymph node on the right side was negative. pT2 pN0 Left prophylactic mastectomy and a sentinel lymph node negative for malignancy.  #OncotypeDX recurrence score 31, adjuvant chemotherapy benefit more than 15%. # She follows up with plastic surgeon Dr. Marla Roe, she has expander, expanding process is being during chemotherapy. Mediport was placed by Dr. Peyton Najjar on 02/18/2019.  Patient had 1 cycle of AC, chemotherapy plan was switched to docetaxel and carboplatin after consulting expert opinion. She has finished 4 cycle of docetaxel and carboplatin. May 2021, patient underwent laparoscopic-assisted vaginal hysterectomy with bilateralsalpingectomy and oophorectomy  INTERVAL HISTORY Avigayil Ton Hofland is a 54 y.o. female who has above history reviewed by me today presents for follow up visit for management of stage I breast cancer, ER PR positive, HER-2 negative, hereditary BRCA 2 mutation.  Problems and complaints are listed below: Patient takes Arimidex daily.  She tolerates well.  Manageable intermittent hot flash. She does not have any other new complaints Has bilateral expanders and has been following up with plastic surgeon.  There is a plan for bilateral breast reconstruction on 10/13/2019  .Marland Kitchen   Review of Systems  Constitutional: Negative for appetite change, chills, fatigue and fever.  HENT:   Negative for hearing loss and voice change.   Eyes: Negative for eye problems.  Respiratory: Negative for chest tightness and cough.   Cardiovascular: Negative for chest pain.  Gastrointestinal: Negative  for abdominal distention, abdominal pain, blood in  stool and diarrhea.  Endocrine: Positive for hot flashes.  Genitourinary: Negative for difficulty urinating and frequency.   Musculoskeletal: Negative for arthralgias.  Skin: Negative for itching and rash.  Neurological: Negative for extremity weakness.  Hematological: Negative for adenopathy.  Psychiatric/Behavioral: Negative for confusion. The patient is not nervous/anxious.     MEDICAL HISTORY:  Past Medical History:  Diagnosis Date  . Anemia    things seem back to normal  . Benign neoplasm of sigmoid colon   . Benign neoplasm of transverse colon   . Breast cancer ([place])   . Depression   . Family history of BRCA2 gene positive   . Family history of breast cancer   . Family history of colon cancer   . Family history of lung cancer   . Family history of pancreatic cancer   . GERD (gastroesophageal reflux disease)   . History of kidney stones   . Hypertension   . Malignant neoplasm of lower-outer quadrant of right breast of female, estrogen receptor positive ([place]) 12/17/2018  . Smoker     SURGICAL HISTORY: Past Surgical History:  Procedure Laterality Date  . AXILLARY SENTINEL NODE BIOPSY Bilateral 01/14/2019   Procedure: AXILLARY SENTINEL NODE BIOPSY;  Surgeon: Herbert Pun, MD;  Location: [facility];  Service: General;  Laterality: Bilateral;  . BREAST BIOPSY Right 12/08/2018   [place] bx of mass with calcs path pending  . BREAST BIOPSY Right 12/08/2018   [place] bx of LN, path pending  . BREAST RECONSTRUCTION WITH PLACEMENT OF TISSUE EXPANDER AND ALLODERM Bilateral 01/14/2019   Procedure: BILATERAL BREAST RECONSTRUCTION WITH PLACEMENT OF TISSUE EXPANDER AND FLEX HD;  Surgeon: Wallace Going, DO;  Location: [facility];  Service: Plastics;  Laterality: Bilateral;  . COLONOSCOPY WITH PROPOFOL N/A 02/17/2015   Procedure: COLONOSCOPY WITH PROPOFOL;  Surgeon: Lucilla Lame, MD;  Location: [place];  Service: Endoscopy;  Laterality: N/A;  . COLONOSCOPY WITH PROPOFOL N/A  07/12/2019   Procedure: COLONOSCOPY WITH BIOPSY;  Surgeon: Lucilla Lame, MD;  Location: [place];  Service: Endoscopy;  Laterality: N/A;  priority 3 PORT A CATH NEEDS ACCESSED  . CYSTOSCOPY N/A 08/05/2019   Procedure: CYSTOSCOPY;  Surgeon: Homero Fellers, MD;  Location: [facility];  Service: Gynecology;  Laterality: N/A;  . POLYPECTOMY  02/17/2015   Procedure: POLYPECTOMY;  Surgeon: Lucilla Lame, MD;  Location: [place];  Service: Endoscopy;;  . POLYPECTOMY N/A 07/12/2019   Procedure: POLYPECTOMY;  Surgeon: Lucilla Lame, MD;  Location: [place];  Service: Endoscopy;  Laterality: N/A;  . PORTACATH PLACEMENT Left 02/18/2019   Procedure: INSERTION PORT-A-CATH;  Surgeon: Herbert Pun, MD;  Location: [facility];  Service: General;  Laterality: Left;  . SIMPLE MASTECTOMY WITH AXILLARY SENTINEL NODE BIOPSY Left 01/14/2019   Procedure: LEFT SIMPLE MASTECTOMY;  Surgeon: Herbert Pun, MD;  Location: [facility];  Service: General;  Laterality: Left;  . TOTAL LAPAROSCOPIC HYSTERECTOMY WITH BILATERAL SALPINGO OOPHORECTOMY Bilateral 08/05/2019   Procedure: TOTAL LAPAROSCOPIC HYSTERECTOMY WITH BILATERAL SALPINGO OOPHORECTOMY;  Surgeon: Homero Fellers, MD;  Location: [facility];  Service: Gynecology;  Laterality: Bilateral;  . TOTAL MASTECTOMY Right 01/14/2019   Procedure: RIGHT TOTAL MASTECTOMY;  Surgeon: Herbert Pun, MD;  Location: [facility];  Service: General;  Laterality: Right;  . WISDOM TOOTH EXTRACTION      SOCIAL HISTORY: Social History   Socioeconomic History  . Marital status: Married    Spouse name: Jenny Reichmann  . Number of children: Not  on file  . Years of education: Not on file  . Highest education level: Not on file  Occupational History  . Occupation: Physicist, medical    Comment: retired  Tobacco Use  . Smoking status: Current Some Day Smoker    Packs/day: 0.05    Years: 20.00    Pack years: 1.00    Types: Cigarettes    Last attempt to  quit: 01/07/2019    Years since quitting: 0.7  . Smokeless tobacco: Never Used  . Tobacco comment: 4 cigs a day.   Vaping Use  . Vaping Use: Never used  Substance and Sexual Activity  . Alcohol use: Yes    Alcohol/week: 2.0 standard drinks    Types: 2 Standard drinks or equivalent per week    Comment: occasional  . Drug use: No  . Sexual activity: Yes    Birth control/protection: Surgical  Other Topics Concern  . Not on file  Social History Narrative   Patient lives with husband.   Social Determinants of Health   Financial Resource Strain:   . Difficulty of Paying Living Expenses:   Food Insecurity:   . Worried About Charity fundraiser in the Last Year:   . Arboriculturist in the Last Year:   Transportation Needs:   . Film/video editor (Medical):   Marland Kitchen Lack of Transportation (Non-Medical):   Physical Activity:   . Days of Exercise per Week:   . Minutes of Exercise per Session:   Stress:   . Feeling of Stress :   Social Connections:   . Frequency of Communication with Friends and Family:   . Frequency of Social Gatherings with Friends and Family:   . Attends Religious Services:   . Active Member of Clubs or Organizations:   . Attends Archivist Meetings:   Marland Kitchen Marital Status:   Intimate Partner Violence:   . Fear of Current or Ex-Partner:   . Emotionally Abused:   Marland Kitchen Physically Abused:   . Sexually Abused:   She is a retired Pharmacist, hospital  FAMILY HISTORY: Family History  Problem Relation Age of Onset  . Colon cancer Mother 61  . Breast cancer Mother 80       "BRCA2+"  . Hypertension Father   . Stroke Father   . Alzheimer's disease Father   . Prostate cancer Father   . Breast cancer Maternal Aunt        mat great aunt  . Breast cancer Maternal Grandmother 18  . Pancreatic cancer Maternal Grandfather 60    ALLERGIES:  has No Known Allergies.  MEDICATIONS:  Current Outpatient Medications  Medication Sig Dispense Refill  . acetaminophen (TYLENOL)  500 MG tablet Take 2 tablets (1,000 mg total) by mouth every 6 (six) hours as needed for mild pain. 30 tablet 0  . anastrozole (ARIMIDEX) 1 MG tablet Take 1 tablet by mouth once daily 90 tablet 0  . cetirizine (ZYRTEC) 10 MG tablet Take 10 mg by mouth daily.    . diazepam (VALIUM) 2 MG tablet Take 1 tablet (2 mg total) by mouth every 12 (twelve) hours as needed for anxiety. 20 tablet 0  . DULoxetine (CYMBALTA) 60 MG capsule Take 1 capsule (60 mg total) by mouth daily. 90 capsule 3  . lidocaine-prilocaine (EMLA) cream Apply 1 application topically as needed. Place a pea size amount to port 1-2 hours before chemotherapy. (Patient taking differently: Apply 1 application topically as needed (prior to port flushing). ) 30 g 3  .  lisinopril-hydrochlorothiazide (ZESTORETIC) 20-25 MG tablet Take 1 tablet by mouth daily. 90 tablet 1  . omeprazole (PRILOSEC) 40 MG capsule Take 1 capsule by mouth once daily 90 capsule 3  . traZODone (DESYREL) 50 MG tablet Take 1 tablet (50 mg total) by mouth at bedtime. (Patient not taking: Reported on 09/22/2019) 30 tablet 0   No current facility-administered medications for this visit.     PHYSICAL EXAMINATION: ECOG PERFORMANCE STATUS: 0 - Asymptomatic Vitals:   10/06/19 1333  BP: 111/79  Pulse: 81  Resp: 18  Temp: (!) 96.8 F (36 C)   Filed Weights   10/06/19 1333  Weight: 153 lb 4.8 oz (69.5 kg)    Physical Exam Constitutional:      General: She is not in acute distress. HENT:     Head: Normocephalic and atraumatic.  Eyes:     General: No scleral icterus.    Pupils: Pupils are equal, round, and reactive to light.  Cardiovascular:     Rate and Rhythm: Normal rate and regular rhythm.     Heart sounds: Normal heart sounds.  Pulmonary:     Effort: Pulmonary effort is normal. No respiratory distress.     Breath sounds: No wheezing.  Abdominal:     General: Bowel sounds are normal. There is no distension.     Palpations: Abdomen is soft. There is no  mass.     Tenderness: There is no abdominal tenderness.  Musculoskeletal:        General: No deformity. Normal range of motion.     Cervical back: Normal range of motion and neck supple.  Skin:    General: Skin is warm and dry.     Findings: No erythema or rash.  Neurological:     Mental Status: She is alert and oriented to person, place, and time. Mental status is at baseline.     Cranial Nerves: No cranial nerve deficit.     Coordination: Coordination normal.  Psychiatric:        Mood and Affect: Mood normal.   Breast exam is performed in seated and lying down position. Patient is status post bilateral mastectomy with expanders. The edges are intact and there is no evidence of any chest wall recurrence.  No palpable bilateral axillary adenopathy   LABORATORY DATA:  I have reviewed the data as listed Lab Results  Component Value Date   WBC 6.7 10/06/2019   HGB 13.9 10/06/2019   HCT 40.9 10/06/2019   MCV 86.7 10/06/2019   PLT 300 10/06/2019   Recent Labs    07/05/19 1309 08/24/19 1319 10/06/19 1308  NA 136 133* 135  K 3.5 3.4* 3.5  CL 105 99 100  CO2 _0 GLUCOSE 158* 189* 147*  BUN _1 CREATININE 0.61 0.71 0.61  CALCIUM 9.1 9.5 9.2  GFRNONAA >60 >60 >60  GFRAA >60 >60 >60  PROT 7.1 7.4 7.6  ALBUMIN 4.1 4.3 4.3  AST _2 ALT _3 ALKPHOS 103 102 82  BILITOT 0.3 0.5 0.7   Iron/TIBC/Ferritin/ %Sat No results found for: IRON, TIBC, FERRITIN, IRONPCTSAT   RADIOGRAPHIC STUDIES: I have personally reviewed the radiological images as listed and agreed with the findings in the report. US ABDOMEN LIMITED RUQ  Result Date: 08/20/2019 CLINICAL DATA:  Enlarged gallbladder.  Right upper quadrant pain. EXAM: ULTRASOUND ABDOMEN LIMITED RIGHT UPPER QUADRANT COMPARISON:  None. FINDINGS: Gallbladder: No gallstones or wall thickening visualized. No sonographic Murphy sign noted  by sonographer. Gallbladder appears to be normal in size. Common bile duct: Diameter:  3.1 mm, normal. Liver: No focal lesion identified. Within normal limits in parenchymal echogenicity. Portal vein is patent on color Doppler imaging with normal direction of blood flow towards the liver. Other: None. IMPRESSION: Normal right upper quadrant ultrasound. No evidence of gallbladder pathology or abnormal size. Electronically Signed   By: Nelson Chimes M.D.   On: 08/20/2019 13:46      ASSESSMENT & PLAN:  1. Malignant neoplasm of lower-outer quadrant of right breast of female, estrogen receptor positive ([place])   2. BRCA2 gene mutation positive in female   3. Port-A-Cath in place   4. Aromatase inhibitor use    Stage IB pT2 pN0 M0 ER + PR+ HER2 negative right breast cancer, grade 3, +LVI,   -BRCA2 positive She tolerates adjuvant endocrine therapy.  Continue Arimidex. Recommend calcium supplementation as well as vitamin D supplementation We discussed about DEXA scan.  Due to the expander, patient prefers to do DEXA after she finishes reconstruction procedures.  She will call us to have DEXA scheduled when she is ready.  #BRCA2 gene mutation positive, status post hysterectomy with bilateral salpingo-oophorectomy. Screening for pancreatic cancer can be considered in the future.  I will wait until she finishes her breast reconstruction.  Procedures  #Port-A-Cath in place, recommend port flush every 6 to 8 weeks.  Continue port flush.  I discussed about keeping port 1 year after surgery.  She agrees.  All questions were answered. The patient knows to call the clinic with any problems questions or concerns. Follow-up in 3 months.   Earlie Server, MD, PhD Hematology Oncology [facility] at [facility] Pager- [phone] 10/06/2019

## 2019-10-07 ENCOUNTER — Other Ambulatory Visit: Payer: BC Managed Care – PPO

## 2019-10-07 ENCOUNTER — Ambulatory Visit: Payer: BC Managed Care – PPO | Admitting: Oncology

## 2019-10-09 ENCOUNTER — Other Ambulatory Visit (HOSPITAL_COMMUNITY): Payer: BC Managed Care – PPO

## 2019-10-12 ENCOUNTER — Other Ambulatory Visit (HOSPITAL_COMMUNITY)
Admission: RE | Admit: 2019-10-12 | Discharge: 2019-10-12 | Disposition: A | Discharge status: Home / Self Care | Payer: BC Managed Care – PPO | Source: Ambulatory Visit | Attending: Plastic Surgery | Admitting: Plastic Surgery

## 2019-10-12 DIAGNOSIS — Z01812 Encounter for preprocedural laboratory examination: Secondary | ICD-10-CM | POA: Diagnosis not present

## 2019-10-12 DIAGNOSIS — Z20822 Contact with and (suspected) exposure to covid-19: Secondary | ICD-10-CM | POA: Insufficient documentation

## 2019-10-12 LAB — SARS CORONAVIRUS 2 (TAT 6-24 HRS): SARS Coronavirus 2: NEGATIVE

## 2019-10-13 ENCOUNTER — Ambulatory Visit (HOSPITAL_BASED_OUTPATIENT_CLINIC_OR_DEPARTMENT_OTHER): Payer: BC Managed Care – PPO | Admitting: Anesthesiology

## 2019-10-13 ENCOUNTER — Other Ambulatory Visit: Payer: Self-pay

## 2019-10-13 ENCOUNTER — Ambulatory Visit (HOSPITAL_BASED_OUTPATIENT_CLINIC_OR_DEPARTMENT_OTHER)
Admission: RE | Admit: 2019-10-13 | Discharge: 2019-10-13 | Disposition: A | Discharge status: Home / Self Care | Payer: BC Managed Care – PPO | Source: Ambulatory Visit | Attending: Plastic Surgery | Admitting: Plastic Surgery

## 2019-10-13 ENCOUNTER — Encounter (HOSPITAL_BASED_OUTPATIENT_CLINIC_OR_DEPARTMENT_OTHER): Payer: Self-pay | Admitting: Plastic Surgery

## 2019-10-13 ENCOUNTER — Encounter (HOSPITAL_BASED_OUTPATIENT_CLINIC_OR_DEPARTMENT_OTHER)
Admission: RE | Disposition: A | Discharge status: Home / Self Care | Payer: Self-pay | Source: Ambulatory Visit | Attending: Plastic Surgery

## 2019-10-13 DIAGNOSIS — Z170 Estrogen receptor positive status [ER+]: Secondary | ICD-10-CM | POA: Insufficient documentation

## 2019-10-13 DIAGNOSIS — I10 Essential (primary) hypertension: Secondary | ICD-10-CM | POA: Diagnosis not present

## 2019-10-13 DIAGNOSIS — Z9013 Acquired absence of bilateral breasts and nipples: Secondary | ICD-10-CM | POA: Diagnosis not present

## 2019-10-13 DIAGNOSIS — C50511 Malignant neoplasm of lower-outer quadrant of right female breast: Secondary | ICD-10-CM | POA: Diagnosis not present

## 2019-10-13 DIAGNOSIS — Z45811 Encounter for adjustment or removal of right breast implant: Secondary | ICD-10-CM | POA: Insufficient documentation

## 2019-10-13 DIAGNOSIS — K219 Gastro-esophageal reflux disease without esophagitis: Secondary | ICD-10-CM | POA: Diagnosis not present

## 2019-10-13 DIAGNOSIS — Z79811 Long term (current) use of aromatase inhibitors: Secondary | ICD-10-CM | POA: Diagnosis not present

## 2019-10-13 DIAGNOSIS — Z803 Family history of malignant neoplasm of breast: Secondary | ICD-10-CM | POA: Diagnosis not present

## 2019-10-13 DIAGNOSIS — F17210 Nicotine dependence, cigarettes, uncomplicated: Secondary | ICD-10-CM | POA: Insufficient documentation

## 2019-10-13 DIAGNOSIS — Z79899 Other long term (current) drug therapy: Secondary | ICD-10-CM | POA: Diagnosis not present

## 2019-10-13 DIAGNOSIS — Z45812 Encounter for adjustment or removal of left breast implant: Secondary | ICD-10-CM | POA: Insufficient documentation

## 2019-10-13 DIAGNOSIS — F329 Major depressive disorder, single episode, unspecified: Secondary | ICD-10-CM | POA: Insufficient documentation

## 2019-10-13 DIAGNOSIS — Z452 Encounter for adjustment and management of vascular access device: Secondary | ICD-10-CM | POA: Diagnosis not present

## 2019-10-13 HISTORY — PX: REMOVAL OF BILATERAL TISSUE EXPANDERS WITH PLACEMENT OF BILATERAL BREAST IMPLANTS: SHX6431

## 2019-10-13 HISTORY — PX: PORTA CATH REMOVAL: CATH118286

## 2019-10-13 SURGERY — REMOVAL, TISSUE EXPANDER, BREAST, BILATERAL, WITH BILATERAL IMPLANT IMPLANT INSERTION
Anesthesia: General | Site: Chest | Laterality: Left

## 2019-10-13 MED ORDER — BUPIVACAINE-EPINEPHRINE 0.25% -1:200000 IJ SOLN
INTRAMUSCULAR | Status: DC | PRN
  Administered 2019-10-13: 10 mL

## 2019-10-13 MED ORDER — FENTANYL CITRATE (PF) 100 MCG/2ML IJ SOLN
INTRAMUSCULAR | Status: DC | PRN
  Administered 2019-10-13: 50 ug via INTRAVENOUS
  Administered 2019-10-13: 25 ug via INTRAVENOUS
  Administered 2019-10-13: 50 ug via INTRAVENOUS
  Administered 2019-10-13: 25 ug via INTRAVENOUS

## 2019-10-13 MED ORDER — ACETAMINOPHEN 325 MG PO TABS: 325.0000 mg | ORAL_TABLET | Freq: Once | ORAL | Status: DC

## 2019-10-13 MED ORDER — SODIUM CHLORIDE 0.9 % IV SOLN
INTRAVENOUS | Status: DC | PRN
  Administered 2019-10-13: 1000 mL

## 2019-10-13 MED ORDER — LIDOCAINE 2% (20 MG/ML) 5 ML SYRINGE
INTRAMUSCULAR | Status: AC
  Filled 2019-10-13: qty 5

## 2019-10-13 MED ORDER — ACETAMINOPHEN 160 MG/5ML PO SOLN: 325.0000 mg | Freq: Once | ORAL | Status: DC

## 2019-10-13 MED ORDER — PROPOFOL 500 MG/50ML IV EMUL
INTRAVENOUS | Status: DC | PRN
  Administered 2019-10-13: 25 ug/kg/min via INTRAVENOUS

## 2019-10-13 MED ORDER — LACTATED RINGERS IV SOLN: INTRAVENOUS | Status: DC

## 2019-10-13 MED ORDER — PROPOFOL 10 MG/ML IV BOLUS
INTRAVENOUS | Status: DC | PRN
  Administered 2019-10-13: 100 mg via INTRAVENOUS

## 2019-10-13 MED ORDER — SODIUM CHLORIDE 0.9% FLUSH: 3.0000 mL | Freq: Two times a day (BID) | INTRAVENOUS | Status: DC

## 2019-10-13 MED ORDER — ACETAMINOPHEN 325 MG PO TABS: 650.0000 mg | ORAL_TABLET | ORAL | Status: DC | PRN

## 2019-10-13 MED ORDER — HYDROMORPHONE HCL 1 MG/ML IJ SOLN
INTRAMUSCULAR | Status: AC
  Filled 2019-10-13: qty 0.5

## 2019-10-13 MED ORDER — FENTANYL CITRATE (PF) 100 MCG/2ML IJ SOLN
INTRAMUSCULAR | Status: AC
  Filled 2019-10-13: qty 2

## 2019-10-13 MED ORDER — OXYCODONE HCL 5 MG PO TABS
ORAL_TABLET | ORAL | Status: AC
  Filled 2019-10-13: qty 1

## 2019-10-13 MED ORDER — EPHEDRINE 5 MG/ML INJ
INTRAVENOUS | Status: AC
  Filled 2019-10-13: qty 10

## 2019-10-13 MED ORDER — LIDOCAINE HCL (CARDIAC) PF 100 MG/5ML IV SOSY
PREFILLED_SYRINGE | INTRAVENOUS | Status: DC | PRN
  Administered 2019-10-13: 60 mg via INTRAVENOUS

## 2019-10-13 MED ORDER — ONDANSETRON HCL 4 MG/2ML IJ SOLN
INTRAMUSCULAR | Status: AC
  Filled 2019-10-13: qty 4

## 2019-10-13 MED ORDER — CEFAZOLIN SODIUM-DEXTROSE 2-4 GM/100ML-% IV SOLN
INTRAVENOUS | Status: AC
  Filled 2019-10-13: qty 100

## 2019-10-13 MED ORDER — ACETAMINOPHEN 325 MG RE SUPP: 650.0000 mg | RECTAL | Status: DC | PRN

## 2019-10-13 MED ORDER — MORPHINE SULFATE (PF) 4 MG/ML IV SOLN: 2.0000 mg | INTRAVENOUS | Status: DC | PRN

## 2019-10-13 MED ORDER — OXYCODONE HCL 5 MG PO TABS: 5.0000 mg | ORAL_TABLET | Freq: Once | ORAL | Status: DC | PRN

## 2019-10-13 MED ORDER — CHLORHEXIDINE GLUCONATE CLOTH 2 % EX PADS: 6.0000 | MEDICATED_PAD | Freq: Once | CUTANEOUS | Status: DC

## 2019-10-13 MED ORDER — DEXAMETHASONE SODIUM PHOSPHATE 4 MG/ML IJ SOLN
INTRAMUSCULAR | Status: DC | PRN
  Administered 2019-10-13: 4 mg via INTRAVENOUS

## 2019-10-13 MED ORDER — OXYCODONE HCL 5 MG PO TABS
5.0000 mg | ORAL_TABLET | ORAL | Status: DC | PRN
  Administered 2019-10-13: 5 mg via ORAL

## 2019-10-13 MED ORDER — ONDANSETRON HCL 4 MG/2ML IJ SOLN
INTRAMUSCULAR | Status: DC | PRN
  Administered 2019-10-13: 4 mg via INTRAVENOUS

## 2019-10-13 MED ORDER — SODIUM CHLORIDE 0.9% FLUSH: 3.0000 mL | INTRAVENOUS | Status: DC | PRN

## 2019-10-13 MED ORDER — MEPERIDINE HCL 25 MG/ML IJ SOLN: 6.2500 mg | INTRAMUSCULAR | Status: DC | PRN

## 2019-10-13 MED ORDER — ONDANSETRON HCL 4 MG/2ML IJ SOLN: 4.0000 mg | Freq: Once | INTRAMUSCULAR | Status: DC | PRN

## 2019-10-13 MED ORDER — SODIUM CHLORIDE 0.9 % IV SOLN: 250.0000 mL | INTRAVENOUS | Status: DC | PRN

## 2019-10-13 MED ORDER — PROPOFOL 10 MG/ML IV BOLUS
INTRAVENOUS | Status: AC
  Filled 2019-10-13: qty 20

## 2019-10-13 MED ORDER — CEFAZOLIN SODIUM-DEXTROSE 2-4 GM/100ML-% IV SOLN
2.0000 g | INTRAVENOUS | Status: AC
  Administered 2019-10-13: 2 g via INTRAVENOUS

## 2019-10-13 MED ORDER — DEXAMETHASONE SODIUM PHOSPHATE 10 MG/ML IJ SOLN
INTRAMUSCULAR | Status: AC
  Filled 2019-10-13: qty 1

## 2019-10-13 MED ORDER — MIDAZOLAM HCL 5 MG/5ML IJ SOLN
INTRAMUSCULAR | Status: DC | PRN
  Administered 2019-10-13: 2 mg via INTRAVENOUS

## 2019-10-13 MED ORDER — PROPOFOL 500 MG/50ML IV EMUL
INTRAVENOUS | Status: AC
  Filled 2019-10-13: qty 50

## 2019-10-13 MED ORDER — MIDAZOLAM HCL 2 MG/2ML IJ SOLN
INTRAMUSCULAR | Status: AC
  Filled 2019-10-13: qty 2

## 2019-10-13 MED ORDER — HYDROMORPHONE HCL 1 MG/ML IJ SOLN
0.2500 mg | INTRAMUSCULAR | Status: DC | PRN
  Administered 2019-10-13: 0.5 mg via INTRAVENOUS

## 2019-10-13 MED ORDER — LIDOCAINE HCL (PF) 1 % IJ SOLN
INTRAMUSCULAR | Status: AC
  Filled 2019-10-13: qty 30

## 2019-10-13 MED ORDER — OXYCODONE HCL 5 MG/5ML PO SOLN: 5.0000 mg | Freq: Once | ORAL | Status: DC | PRN

## 2019-10-13 SURGICAL SUPPLY — 68 items
BAG DECANTER FOR FLEXI CONT (MISCELLANEOUS) ×3 IMPLANT
BINDER BREAST LRG (GAUZE/BANDAGES/DRESSINGS) ×3 IMPLANT
BINDER BREAST MEDIUM (GAUZE/BANDAGES/DRESSINGS) IMPLANT
BINDER BREAST XLRG (GAUZE/BANDAGES/DRESSINGS) IMPLANT
BINDER BREAST XXLRG (GAUZE/BANDAGES/DRESSINGS) IMPLANT
BIOPATCH RED 1 DISK 7.0 (GAUZE/BANDAGES/DRESSINGS) IMPLANT
BLADE HEX COATED 2.75 (ELECTRODE) ×3 IMPLANT
BLADE SURG 15 STRL LF DISP TIS (BLADE) ×4 IMPLANT
BLADE SURG 15 STRL SS (BLADE) ×2
CANISTER SUCT 1200ML W/VALVE (MISCELLANEOUS) ×3 IMPLANT
CORD BIPOLAR FORCEPS 12FT (ELECTRODE) IMPLANT
COVER BACK TABLE 60X90IN (DRAPES) ×3 IMPLANT
COVER MAYO STAND STRL (DRAPES) ×3 IMPLANT
COVER WAND RF STERILE (DRAPES) IMPLANT
DECANTER SPIKE VIAL GLASS SM (MISCELLANEOUS) IMPLANT
DERMABOND ADVANCED (GAUZE/BANDAGES/DRESSINGS)
DERMABOND ADVANCED .7 DNX12 (GAUZE/BANDAGES/DRESSINGS) IMPLANT
DRAIN CHANNEL 19F RND (DRAIN) IMPLANT
DRAPE LAPAROSCOPIC ABDOMINAL (DRAPES) ×3 IMPLANT
DRSG PAD ABDOMINAL 8X10 ST (GAUZE/BANDAGES/DRESSINGS) ×6 IMPLANT
ELECT BLADE 4.0 EZ CLEAN MEGAD (MISCELLANEOUS) ×3
ELECT REM PT RETURN 9FT ADLT (ELECTROSURGICAL) ×3
ELECTRODE BLDE 4.0 EZ CLN MEGD (MISCELLANEOUS) ×2 IMPLANT
ELECTRODE REM PT RTRN 9FT ADLT (ELECTROSURGICAL) ×2 IMPLANT
EVACUATOR SILICONE 100CC (DRAIN) IMPLANT
FUNNEL KELLER 2 DISP (MISCELLANEOUS) ×3 IMPLANT
GAUZE SPONGE 4X4 12PLY STRL LF (GAUZE/BANDAGES/DRESSINGS) IMPLANT
GLOVE BIO SURGEON STRL SZ 6.5 (GLOVE) ×12 IMPLANT
GLOVE BIO SURGEON STRL SZ7 (GLOVE) ×9 IMPLANT
GOWN STRL REUS W/ TWL LRG LVL3 (GOWN DISPOSABLE) ×6 IMPLANT
GOWN STRL REUS W/TWL LRG LVL3 (GOWN DISPOSABLE) ×3
IMPL BREAST SMOOTH UH 455CC (Breast) ×2 IMPLANT
IMPL SILICONE 480CC (Breast) ×2 IMPLANT
IMPLANT BREAST SMOOTH UH 455CC (Breast) ×3 IMPLANT
IMPLANT SILICONE 480CC (Breast) ×3 IMPLANT
IV NS 1000ML (IV SOLUTION)
IV NS 1000ML BAXH (IV SOLUTION) IMPLANT
IV NS 500ML (IV SOLUTION)
IV NS 500ML BAXH (IV SOLUTION) IMPLANT
KIT FILL SYSTEM UNIVERSAL (SET/KITS/TRAYS/PACK) IMPLANT
NDL SAFETY ECLIPSE 18X1.5 (NEEDLE) ×2 IMPLANT
NEEDLE HYPO 18GX1.5 SHARP (NEEDLE) ×1
NEEDLE HYPO 25X1 1.5 SAFETY (NEEDLE) ×3 IMPLANT
PACK BASIN DAY SURGERY FS (CUSTOM PROCEDURE TRAY) ×3 IMPLANT
PENCIL SMOKE EVACUATOR (MISCELLANEOUS) ×3 IMPLANT
PIN SAFETY STERILE (MISCELLANEOUS) IMPLANT
SIZER BREAST GEL REUSE 480CC (SIZER) ×3
SIZER BREAST REUSE 455CC (SIZER) ×3
SIZER BRST GEL REUSE 480CC (SIZER) ×2 IMPLANT
SIZER BRST REUSE 455CC (SIZER) ×2 IMPLANT
SLEEVE SCD COMPRESS KNEE MED (MISCELLANEOUS) ×3 IMPLANT
SPONGE LAP 18X18 RF (DISPOSABLE) ×9 IMPLANT
STRIP SUTURE WOUND CLOSURE 1/2 (MISCELLANEOUS) IMPLANT
SUT MNCRL AB 4-0 PS2 18 (SUTURE) ×6 IMPLANT
SUT MON AB 3-0 SH 27 (SUTURE) ×4
SUT MON AB 3-0 SH27 (SUTURE) ×8 IMPLANT
SUT MON AB 5-0 PS2 18 (SUTURE) ×3 IMPLANT
SUT PDS AB 2-0 CT2 27 (SUTURE) IMPLANT
SUT VIC AB 3-0 SH 27 (SUTURE)
SUT VIC AB 3-0 SH 27X BRD (SUTURE) IMPLANT
SUT VICRYL 4-0 PS2 18IN ABS (SUTURE) IMPLANT
SYR BULB IRRIG 60ML STRL (SYRINGE) ×3 IMPLANT
SYR CONTROL 10ML LL (SYRINGE) ×3 IMPLANT
TOWEL GREEN STERILE FF (TOWEL DISPOSABLE) ×6 IMPLANT
TRAY DSU PREP LF (CUSTOM PROCEDURE TRAY) ×3 IMPLANT
TUBE CONNECTING 20X1/4 (TUBING) ×3 IMPLANT
UNDERPAD 30X36 HEAVY ABSORB (UNDERPADS AND DIAPERS) ×6 IMPLANT
YANKAUER SUCT BULB TIP NO VENT (SUCTIONS) ×3 IMPLANT

## 2019-10-13 NOTE — Anesthesia Postprocedure Evaluation (Signed)
Anesthesia Post Note  Patient: Cheryl Mooney  Procedure(s) Performed: REMOVAL OF BILATERAL TISSUE EXPANDERS WITH PLACEMENT OF BILATERAL BREAST IMPLANTS (Bilateral Breast) PORTA CATH REMOVAL (Left Chest)     Patient location during evaluation: PACU Anesthesia Type: General Level of consciousness: awake and alert and oriented Pain management: pain level controlled Vital Signs Assessment: post-procedure vital signs reviewed and stable Respiratory status: spontaneous breathing, nonlabored ventilation and respiratory function stable Cardiovascular status: blood pressure returned to baseline and stable Postop Assessment: no apparent nausea or vomiting Anesthetic complications: no   No complications documented.  Last Vitals:  Vitals:   10/13/19 1515 10/13/19 1547  BP: 138/88 (!) 147/86  Pulse: 78 74  Resp: 15 18  Temp:  36.4 C  SpO2: 98% 94%    Last Pain:  Vitals:   10/13/19 1547  TempSrc: Oral  PainSc: 3                  Tanvi Gatling A.

## 2019-10-13 NOTE — Interval H&P Note (Signed)
History and Physical Interval Note:  10/13/2019 12:24 PM  Cheryl Mooney  has presented today for surgery, with the diagnosis of Acquired Absence Of Bilateral Breasts And Nipples.  The various methods of treatment have been discussed with the patient and family. After consideration of risks, benefits and other options for treatment, the patient has consented to  Procedure(s) with comments: REMOVAL OF BILATERAL TISSUE EXPANDERS WITH PLACEMENT OF BILATERAL BREAST IMPLANTS (Bilateral) - 2 hours please as a surgical intervention.  The patient's history has been reviewed, patient examined, no change in status, stable for surgery.  I have reviewed the patient's chart and labs.  Questions were answered to the patient's satisfaction.     Cheryl Mooney

## 2019-10-13 NOTE — Anesthesia Preprocedure Evaluation (Signed)
Anesthesia Evaluation  Patient identified by MRN, date of birth, ID band Patient awake    Reviewed: Allergy & Precautions, NPO status , Patient's Chart, lab work & pertinent test results  Airway Mallampati: II  TM Distance: >3 FB Neck ROM: Full    Dental no notable dental hx. (+) Teeth Intact, Caps   Pulmonary Current Smoker,    Pulmonary exam normal breath sounds clear to auscultation       Cardiovascular hypertension, Pt. on medications Normal cardiovascular exam Rhythm:Regular Rate:Normal     Neuro/Psych PSYCHIATRIC DISORDERS Depression negative neurological ROS     GI/Hepatic Neg liver ROS, GERD  ,  Endo/Other  Acquired absence of bilateral breast and nipple Hx/o Breast Ca  Renal/GU Hx/o renal calculi  negative genitourinary   Musculoskeletal negative musculoskeletal ROS (+)   Abdominal   Peds  Hematology  (+) anemia ,   Anesthesia Other Findings   Reproductive/Obstetrics                             Anesthesia Physical Anesthesia Plan  ASA: II  Anesthesia Plan: General   Post-op Pain Management:    Induction: Intravenous  PONV Risk Score and Plan: 3 and Scopolamine patch - Pre-op, Midazolam and Ondansetron  Airway Management Planned: LMA and Oral ETT  Additional Equipment:   Intra-op Plan:   Post-operative Plan: Extubation in OR  Informed Consent: I have reviewed the patients History and Physical, chart, labs and discussed the procedure including the risks, benefits and alternatives for the proposed anesthesia with the patient or authorized representative who has indicated his/her understanding and acceptance.     Dental advisory given  Plan Discussed with: CRNA and Surgeon  Anesthesia Plan Comments:         Anesthesia Quick Evaluation

## 2019-10-13 NOTE — Discharge Instructions (Signed)
INSTRUCTIONS FOR AFTER SURGERY   You will likely have some questions about what to expect following your operation.  The following information will help you and your family understand what to expect when you are discharged from the hospital.  Following these guidelines will help ensure a smooth recovery and reduce risks of complications.  Postoperative instructions include information on: diet, wound care, medications and physical activity.  AFTER SURGERY Expect to go home after the procedure.  In some cases, you may need to spend one night in the hospital for observation.  DIET This surgery does not require a specific diet.  However, I have to mention that the healthier you eat the better your body can start healing. It is important to increasing your protein intake.  This means limiting the foods with added sugar.  Focus on fruits and vegetables and some meat.  If you have any liposuction during your procedure be sure to drink water.  If your urine is bright yellow, then it is concentrated, and you need to drink more water.  As a general rule after surgery, you should have 8 ounces of water every hour while awake.  If you find you are persistently nauseated or unable to take in liquids let us know.  NO TOBACCO USE or EXPOSURE.  This will slow your healing process and increase the risk of a wound.  WOUND CARE If you don't have a drain: You can shower the day after surgery.  Use fragrance free soap.  Dial, Dove, Ivory and Cetaphil are usually mild on the skin.  If you have steri-strips / tape directly attached to your skin leave them in place. It is OK to get these wet.  No baths, pools or hot tubs for two weeks. We close your incision to leave the smallest and best-looking scar. No ointment or creams on your incisions until given the go ahead.  Especially not Neosporin (Too many skin reactions with this one).  A few weeks after surgery you can use Mederma and start massaging the scar. We ask you to  wear your binder or sports bra for the first 6 weeks around the clock, including while sleeping. This provides added comfort and helps reduce the fluid accumulation at the surgery site.  ACTIVITY No heavy lifting until cleared by the doctor.  It is OK to walk and climb stairs. In fact, moving your legs is very important to decrease your risk of a blood clot.  It will also help keep you from getting deconditioned.  Every 1 to 2 hours get up and walk for 5 minutes. This will help with a quicker recovery back to normal.  Let pain be your guide so you don't do too much.  NO, you cannot do the spring cleaning and don't plan on taking care of anyone else.  This is your time for TLC.   WORK Everyone returns to work at different times. As a rough guide, most people take at least 1 - 2 weeks off prior to returning to work. If you need documentation for your job, bring the forms to your postoperative follow up visit.  DRIVING Arrange for someone to bring you home from the hospital.  You may be able to drive a few days after surgery but not while taking any narcotics or valium.  BOWEL MOVEMENTS Constipation can occur after anesthesia and while taking pain medication.  It is important to stay ahead for your comfort.  We recommend taking Milk of Magnesia (2 tablespoons; twice   a day) while taking the pain pills.  SEROMA This is fluid your body tried to put in the surgical site.  This is normal but if it creates excessive pain and swelling let us know.  It usually decreases in a few weeks.  MEDICATIONS and PAIN CONTROL At your preoperative visit for you history and physical you were given the following medications: 1. An antibiotic: Start this medication when you get home and take according to the instructions on the bottle. 2. Zofran 4 mg:  This is to treat nausea and vomiting.  You can take this every 6 hours as needed and only if needed. 3. Norco (hydrocodone/acetaminophen) 5/325 mg:  This is only to be  used after you have taken the motrin or the tylenol. Every 8 hours as needed. Over the counter Medication to take: 4. Ibuprofen (Motrin) 600 mg:  Take this every 6 hours.  If you have additional pain then take 500 mg of the tylenol.  Only take the Norco after you have tried these two. 5. Miralax or stool softener of choice: Take this according to the bottle if you take the Norco.  WHEN TO CALL Call your surgeon's office if any of the following occur: . Fever 101 degrees F or greater . Excessive bleeding or fluid from the incision site. . Pain that increases over time without aid from the medications . Redness, warmth, or pus draining from incision sites . Persistent nausea or inability to take in liquids . Severe misshapen area that underwent the operation.   Post Anesthesia Home Care Instructions  Activity: Get plenty of rest for the remainder of the day. A responsible individual must stay with you for 24 hours following the procedure.  For the next 24 hours, DO NOT: -Drive a car -Operate machinery -Drink alcoholic beverages -Take any medication unless instructed by your physician -Make any legal decisions or sign important papers.  Meals: Start with liquid foods such as gelatin or soup. Progress to regular foods as tolerated. Avoid greasy, spicy, heavy foods. If nausea and/or vomiting occur, drink only clear liquids until the nausea and/or vomiting subsides. Call your physician if vomiting continues.  Special Instructions/Symptoms: Your throat may feel dry or sore from the anesthesia or the breathing tube placed in your throat during surgery. If this causes discomfort, gargle with warm salt water. The discomfort should disappear within 24 hours.     

## 2019-10-13 NOTE — Anesthesia Procedure Notes (Signed)
Procedure Name: LMA Insertion Date/Time: 10/13/2019 12:47 PM Performed by: Signe Colt, CRNA Pre-anesthesia Checklist: Patient identified, Emergency Drugs available, Suction available and Patient being monitored Patient Re-evaluated:Patient Re-evaluated prior to induction Oxygen Delivery Method: Circle system utilized Preoxygenation: Pre-oxygenation with 100% oxygen Induction Type: IV induction Ventilation: Mask ventilation without difficulty LMA: LMA inserted LMA Size: 4.0 and 3.0 Number of attempts: 1 Airway Equipment and Method: Bite block Placement Confirmation: positive ETCO2 Tube secured with: Tape Dental Injury: Teeth and Oropharynx as per pre-operative assessment

## 2019-10-13 NOTE — Transfer of Care (Signed)
Immediate Anesthesia Transfer of Care Note  Patient: Cheryl Mooney  Procedure(s) Performed: REMOVAL OF BILATERAL TISSUE EXPANDERS WITH PLACEMENT OF BILATERAL BREAST IMPLANTS (Bilateral Breast) PORTA CATH REMOVAL (Left Chest)  Patient Location: PACU  Anesthesia Type:General  Level of Consciousness: drowsy and patient cooperative  Airway & Oxygen Therapy: Patient Spontanous Breathing and Patient connected to face mask oxygen  Post-op Assessment: Report given to RN and Post -op Vital signs reviewed and stable  Post vital signs: Reviewed and stable  Last Vitals:  Vitals Value Taken Time  BP    Temp    Pulse 75 10/13/19 1430  Resp 11 10/13/19 1430  SpO2 100 % 10/13/19 1430  Vitals shown include unvalidated device data.  Last Pain:  Vitals:   10/13/19 1040  TempSrc: Oral  PainSc: 0-No pain      Patients Stated Pain Goal: 3 (81/85/63 1497)  Complications: No complications documented.

## 2019-10-13 NOTE — Op Note (Addendum)
Op report Bilateral Exchange   DATE OF OPERATION: 10/13/2019  LOCATION: [facility]  SURGICAL DIVISION: Plastic Surgery  PREOPERATIVE DIAGNOSES:  1 History of breast cancer.  2. Acquired absence of bilateral breast.   POSTOPERATIVE DIAGNOSES:  1. History of breast cancer.  2. Acquired absence of bilateral breast.   PROCEDURE:  1. Bilateral exchange of tissue expanders for implants.  2. Bilateral complete and extensive capsulotomies for implant respositioning. 3. Port-a-cath removal.  SURGEON: Tris Howell Sanger Euclide Granito, DO  ASSISTANT: Phoebe Sharps, PA  ANESTHESIA:  General.   COMPLICATIONS: None.   IMPLANTS: Left - [employer]or Smooth Round Ultra High Profile Gel 455cc. Ref #[id number].  Serial Number [id number] Right - [employer] Smooth Round Ultra High Profile Gel 480cc. Ref #[id number].  Serial Number [id number]  INDICATIONS FOR PROCEDURE:  The patient, Cheryl Mooney, is a 54 y.o. female born on [birth year], is here for treatment after bilateral mastectomies.  She had tissue expanders placed at the time of mastectomies. She now presents for exchange of her expanders for implants.  She requires capsulotomies to better position the implants. MRN: [id number]  CONSENT:  Informed consent was obtained directly from the patient. Risks, benefits and alternatives were fully discussed. Specific risks including but not limited to bleeding, infection, hematoma, seroma, scarring, pain, implant infection, implant extrusion, capsular contracture, asymmetry, wound healing problems, and need for further surgery were all discussed. The patient did have an ample opportunity to have her questions answered to her satisfaction.   DESCRIPTION OF PROCEDURE:  The patient was taken to the operating room. SCDs were placed and IV antibiotics were given. The patient's chest was prepped and draped in a sterile fashion. A time out was performed and the implants to be used were identified.     On the right breast: One percent Lidocaine with epinephrine was used to infiltrate at the incision site. The old mastectomy scar was excised.  The mastectomy flaps from the superior and inferior flaps were raised over the pectoralis major muscle for several centimeters to minimize tension for the closure. The pectoralis was split inferior to the skin incision to expose and remove the tissue expander.  Inspection of the pocket showed a normal healthy capsule and good integration of the biologic matrix.  The pocket was irrigated with antibiotic solution.  Circumferential capsulotomies were performed to allow for breast pocket expansion.  Measurements were made and a sizer used to confirm adequate pocket size for the implant dimensions.  Hemostasis was ensured with electrocautery. New gloves were placed. The implant was soaked in antibiotic solution and then placed in the pocket and oriented appropriately. The pectoralis major muscle and capsule on the anterior surface were re-closed with a 3-0 Monocryl suture. The remaining skin was closed with 4-0 Monocryl deep dermal and 5-0 Monocryl subcuticular stitches.   On the left breast: The old mastectomy scar was excised.  The mastectomy flaps from the superior and inferior flaps were raised over the pectoralis major muscle for several centimeters to minimize tension for the closure. The pectoralis was split inferior to the skin incision to expose and remove the tissue expander.  Inspection of the pocket showed a normal healthy capsule and good integration of the biologic matrix. I tried the 480 cc sizer but felt that the appearance was more symmetric with the smaller implant after trying the 455 cc sizer.  Circumferential capsulotomies were performed to allow for breast pocket expansion.  Measurements were made and a sizer utilized to confirm adequate pocket size for  the implant dimensions.  Hemostasis was ensured with the electrocautery.  New gloves were  applied. The implant was soaked in antibiotic solution and placed in the pocket and oriented appropriately. The pectoralis major muscle and capsule on the anterior surface were re-closed with a 3-0 Monocryl suture. The remaining skin was closed with 4-0 Monocryl deep dermal and 5-0 Monocryl subcuticular stitches.  Dermabond was applied to the incision site. A breast binder and ABDs were placed.  The patient was allowed to wake from anesthesia and taken to the recovery room in satisfactory condition.   Port-a-cath: Local with epinephrine was injected around the Port-A-Cath site.  A 15 blade was used to make an incision at the previous incision site of the placement of port-a-catheter.  The sutures that were securing the Port were released and removed.  The catheter was freed from the soft tissue.  A 4-0 Vicryl was placed around the entry of the catheter.  The patient was placed in the Trendelenburg position.  Pressure was applied to the entry site while removing the catheter to be sure to prevent an air embolus.  The suture was then secured in place.  The catheter was examined and found to be intact.  The deep layers were then closed with 4-0 Monocryl followed by a running subcuticular 5-0 Monocryl.  Dermabond was applied and a sterile dressing.  The advanced practice practitioner (APP) assisted throughout the case.  The APP was essential in retraction and counter traction when needed to make the case progress smoothly.  This retraction and assistance made it possible to see the tissue plans for the procedure.  The assistance was needed for blood control, tissue re-approximation and assisted with closure of the incision site.

## 2019-10-14 ENCOUNTER — Other Ambulatory Visit: Payer: BC Managed Care – PPO

## 2019-10-14 ENCOUNTER — Ambulatory Visit: Payer: BC Managed Care – PPO | Admitting: Oncology

## 2019-10-15 ENCOUNTER — Encounter (HOSPITAL_BASED_OUTPATIENT_CLINIC_OR_DEPARTMENT_OTHER): Payer: Self-pay | Admitting: Plastic Surgery

## 2019-10-22 ENCOUNTER — Encounter: Payer: Self-pay | Admitting: Plastic Surgery

## 2019-10-22 ENCOUNTER — Ambulatory Visit (INDEPENDENT_AMBULATORY_CARE_PROVIDER_SITE_OTHER): Payer: BC Managed Care – PPO | Admitting: Plastic Surgery

## 2019-10-22 ENCOUNTER — Other Ambulatory Visit: Payer: Self-pay

## 2019-10-22 VITALS — BP 123/80 | HR 80 | Temp 97.3°F

## 2019-10-22 DIAGNOSIS — Z9013 Acquired absence of bilateral breasts and nipples: Secondary | ICD-10-CM

## 2019-10-22 NOTE — Progress Notes (Signed)
   Subjective:    Patient ID: Cheryl Mooney, female    DOB: [birth year], 54 y.o.   MRN: [id number]  The patient is a 54 year old female who is here for follow-up after undergoing removal of her bilateral breast expanders and placement of silicone implants.  She still has a little bit of swelling as we would expect.  There is no sign of fluid collection, seroma or hematoma.  The incisions are healing nicely.  As a reminder she has a left ultrahigh profile gel implant 455 cc and a right ultrahigh profile gel implant for 480 cc.  Her pain is controlled with the medication and she is otherwise healing very nicely.     Review of Systems  Constitutional: Positive for activity change.  HENT: Negative.   Eyes: Negative.   Respiratory: Negative.   Cardiovascular: Negative.   Genitourinary: Negative.   Musculoskeletal: Negative.        Objective:   Physical Exam Vitals and nursing note reviewed.  Constitutional:      Appearance: Normal appearance.  HENT:     Head: Normocephalic and atraumatic.  Cardiovascular:     Rate and Rhythm: Normal rate.     Pulses: Normal pulses.  Pulmonary:     Effort: Pulmonary effort is normal. No respiratory distress.  Neurological:     General: No focal deficit present.     Mental Status: She is alert. Mental status is at baseline.  Psychiatric:        Mood and Affect: Mood normal.        Behavior: Behavior normal.       Assessment & Plan:     ICD-10-CM   1. Acquired absence of bilateral breasts and nipples  Z90.13     Continue with the sports bra for another 4 weeks.  I had like to see her back in 2 to 4 weeks for follow-up.  No changes at this time.

## 2019-11-07 DIAGNOSIS — Z9889 Other specified postprocedural states: Secondary | ICD-10-CM | POA: Insufficient documentation

## 2019-11-07 NOTE — Progress Notes (Signed)
Patient is a 54 yr-old female here for follow-up after undergoing removal of bilateral breast expanders and placement of silicone implants on 09/07/51. (left: ultra high profile gel implant 455 cc; right: ultra high profile gel implant 480 cc).   ~ 4 weeks PO Patient is healing very well. Incisions c/d/i.  No signs of infection, redness, drainage, seroma/hematoma.  Steri-Strips were removed today.  Patient denies fever, chills, nausea/vomiting.  Patient spoke with Dr. Marla Roe about some concern she has.  Follow-up in 2 weeks with Dr. Marla Roe.  Call office with any questions/concerns.  Pictures were obtained of the patient and placed in the chart with the patient's or guardian's permission.  The [facility] was signed into law in 2016 which includes the topic of electronic health records.  This provides immediate access to information in MyChart.  This includes consultation notes, operative notes, office notes, lab results and pathology reports.  If you have any questions about what you read please let us know at your next visit or call us at the office.  We are right here with you.

## 2019-11-08 ENCOUNTER — Inpatient Hospital Stay: Payer: BC Managed Care – PPO

## 2019-11-09 ENCOUNTER — Ambulatory Visit (INDEPENDENT_AMBULATORY_CARE_PROVIDER_SITE_OTHER): Payer: BC Managed Care – PPO | Admitting: Plastic Surgery

## 2019-11-09 ENCOUNTER — Other Ambulatory Visit: Payer: Self-pay

## 2019-11-09 ENCOUNTER — Encounter: Payer: Self-pay | Admitting: Plastic Surgery

## 2019-11-09 DIAGNOSIS — Z9889 Other specified postprocedural states: Secondary | ICD-10-CM

## 2019-11-25 ENCOUNTER — Ambulatory Visit (INDEPENDENT_AMBULATORY_CARE_PROVIDER_SITE_OTHER): Payer: BC Managed Care – PPO | Admitting: Internal Medicine

## 2019-11-25 ENCOUNTER — Encounter: Payer: Self-pay | Admitting: Internal Medicine

## 2019-11-25 ENCOUNTER — Other Ambulatory Visit: Payer: Self-pay

## 2019-11-25 VITALS — BP 114/82 | HR 80 | Temp 98.2°F | Ht 64.0 in | Wt 153.0 lb

## 2019-11-25 DIAGNOSIS — I10 Essential (primary) hypertension: Secondary | ICD-10-CM

## 2019-11-25 DIAGNOSIS — E782 Mixed hyperlipidemia: Secondary | ICD-10-CM

## 2019-11-25 DIAGNOSIS — Z0000 Encounter for general adult medical examination without abnormal findings: Secondary | ICD-10-CM | POA: Diagnosis not present

## 2019-11-25 DIAGNOSIS — C50511 Malignant neoplasm of lower-outer quadrant of right female breast: Secondary | ICD-10-CM | POA: Diagnosis not present

## 2019-11-25 DIAGNOSIS — Z8742 Personal history of other diseases of the female genital tract: Secondary | ICD-10-CM | POA: Diagnosis not present

## 2019-11-25 DIAGNOSIS — D126 Benign neoplasm of colon, unspecified: Secondary | ICD-10-CM

## 2019-11-25 DIAGNOSIS — Z170 Estrogen receptor positive status [ER+]: Secondary | ICD-10-CM

## 2019-11-25 LAB — POCT URINALYSIS DIPSTICK
Bilirubin, UA: NEGATIVE
Blood, UA: NEGATIVE
Glucose, UA: NEGATIVE
Ketones, UA: NEGATIVE
Leukocytes, UA: NEGATIVE
Nitrite, UA: NEGATIVE
Protein, UA: NEGATIVE
Spec Grav, UA: <=1.005 — AB (ref 1.010–1.025)
Urobilinogen, UA: 0.2 E.U./dL
pH, UA: 7 (ref 5.0–8.0)

## 2019-11-25 NOTE — Progress Notes (Signed)
Date:  11/25/2019   Name:  Cheryl Mooney   DOB:  1966-01-09   MRN:  [id number]   Chief Complaint: Annual Exam (No breast exam or pap.)  Cheryl Mooney is a 54 y.o. female who presents today for her Complete Annual Exam. She feels well. She reports exercising - walking 5x days a week  . She reports she is sleeping well. Breast complaints - completed breast reconstruction recently.  She is doing well, still has some chest wall pain for which she takes cymbalta.  Mammogram: discontinued DEXA: not indicated Pap smear: 2019 neg thin prep Colonoscopy: 07/2019 - one tubular adenoma  Immunization History  Administered Date(s) Administered  . Influenza,inj,Quad PF,6+ Mos 12/20/2014  . Influenza,inj,quad, With Preservative 02/09/2019  . Influenza-Unspecified 02/09/2019  . Moderna SARS-COVID-2 Vaccination 06/26/2019, 07/25/2019  . PPD Test 11/18/2016  . Tdap 09/20/2011  . Zoster Recombinat (Shingrix) 02/22/2019, 08/23/2019    Hypertension Pertinent negatives include no chest pain, headaches, palpitations or shortness of breath.  Gastroesophageal Reflux She reports no abdominal pain, no chest pain, no coughing or no wheezing. Associated symptoms include fatigue.    Lab Results  Component Value Date   CREATININE 0.61 10/06/2019   BUN 8 10/06/2019   NA 135 10/06/2019   K 3.5 10/06/2019   CL 100 10/06/2019   CO2 24 10/06/2019   Lab Results  Component Value Date   CHOL 281 (H) 11/23/2018   HDL 59 11/23/2018   LDLCALC 196 (H) 11/23/2018   TRIG 131 11/23/2018   CHOLHDL 4.8 (H) 11/23/2018   Lab Results  Component Value Date   TSH 1.830 11/23/2018   Lab Results  Component Value Date   HGBA1C 6.9 (H) 04/19/2019   Lab Results  Component Value Date   WBC 6.7 10/06/2019   HGB 13.9 10/06/2019   HCT 40.9 10/06/2019   MCV 86.7 10/06/2019   PLT 300 10/06/2019   Lab Results  Component Value Date   ALT 13 10/06/2019   AST 19 10/06/2019   ALKPHOS 82 10/06/2019   BILITOT  0.7 10/06/2019     Review of Systems  Constitutional: Positive for fatigue. Negative for appetite change, chills, fever and unexpected weight change.  HENT: Negative for congestion, hearing loss, tinnitus, trouble swallowing and voice change.   Eyes: Negative for visual disturbance.  Respiratory: Negative for cough, chest tightness, shortness of breath and wheezing.   Cardiovascular: Negative for chest pain, palpitations and leg swelling.  Gastrointestinal: Negative for abdominal pain, constipation, diarrhea and vomiting.  Endocrine: Negative for polydipsia and polyuria.  Genitourinary: Negative for dysuria, frequency, genital sores, hematuria, vaginal bleeding and vaginal discharge.  Musculoskeletal: Negative for arthralgias, gait problem and joint swelling.  Skin: Negative for color change and rash.  Neurological: Negative for dizziness, tremors, light-headedness, numbness and headaches.  Hematological: Negative for adenopathy. Does not bruise/bleed easily.  Psychiatric/Behavioral: Positive for sleep disturbance (has bad dreams at night ). Negative for dysphoric mood. The patient is not nervous/anxious.     Patient Active Problem List   Diagnosis Date Noted  . S/P breast reconstruction, bilateral 11/07/2019  . Carrier of high risk cancer gene mutation   . Personal history of colonic polyps   . Polyp of sigmoid colon   . Acquired absence of bilateral breasts and nipples 07/06/2019  . Goals of care, counseling/discussion 03/26/2019  . Hyponatremia 03/08/2019  . Constipation 03/08/2019  . Chest wall pain following surgery 03/08/2019  . Breast cancer ([place]) 01/14/2019  . Encounter for antineoplastic  chemotherapy 12/30/2018  . BRCA2 gene mutation positive in female 12/30/2018  . Family history of pancreatic cancer   . Family history of lung cancer   . Family history of BRCA2 gene positive   . Family history of colon cancer   . Malignant neoplasm of lower-outer quadrant of right  breast of female, estrogen receptor positive ([place]) 12/17/2018  . Hyperlipidemia, mixed 11/19/2016  . Tubular adenoma of colon 02/21/2015  . Tobacco use disorder 11/27/2014  . Essential hypertension 11/27/2014  . Hx of abnormal cervical Pap smear 09/07/2011    No Known Allergies  Past Surgical History:  Procedure Laterality Date  . AXILLARY SENTINEL NODE BIOPSY Bilateral 01/14/2019   Procedure: AXILLARY SENTINEL NODE BIOPSY;  Surgeon: Herbert Pun, MD;  Location: [facility];  Service: General;  Laterality: Bilateral;  . BREAST BIOPSY Right 12/08/2018   [place] bx of mass with calcs path pending  . BREAST BIOPSY Right 12/08/2018   [place] bx of LN, path pending  . BREAST RECONSTRUCTION WITH PLACEMENT OF TISSUE EXPANDER AND ALLODERM Bilateral 01/14/2019   Procedure: BILATERAL BREAST RECONSTRUCTION WITH PLACEMENT OF TISSUE EXPANDER AND FLEX HD;  Surgeon: Wallace Going, DO;  Location: [facility];  Service: Plastics;  Laterality: Bilateral;  . COLONOSCOPY WITH PROPOFOL N/A 02/17/2015   Procedure: COLONOSCOPY WITH PROPOFOL;  Surgeon: Lucilla Lame, MD;  Location: [place];  Service: Endoscopy;  Laterality: N/A;  . COLONOSCOPY WITH PROPOFOL N/A 07/12/2019   Procedure: COLONOSCOPY WITH BIOPSY;  Surgeon: Lucilla Lame, MD;  Location: [place];  Service: Endoscopy;  Laterality: N/A;  priority 3 PORT A CATH NEEDS ACCESSED  . CYSTOSCOPY N/A 08/05/2019   Procedure: CYSTOSCOPY;  Surgeon: Homero Fellers, MD;  Location: [facility];  Service: Gynecology;  Laterality: N/A;  . POLYPECTOMY  02/17/2015   Procedure: POLYPECTOMY;  Surgeon: Lucilla Lame, MD;  Location: [place];  Service: Endoscopy;;  . POLYPECTOMY N/A 07/12/2019   Procedure: POLYPECTOMY;  Surgeon: Lucilla Lame, MD;  Location: [place];  Service: Endoscopy;  Laterality: N/A;  . PORTA CATH REMOVAL Left 10/13/2019   Procedure: PORTA CATH REMOVAL;  Surgeon: Wallace Going, DO;  Location: [place];  Service: Plastics;  Laterality: Left;  . PORTACATH PLACEMENT Left 02/18/2019   Procedure: INSERTION PORT-A-CATH;  Surgeon: Herbert Pun, MD;  Location: [facility];  Service: General;  Laterality: Left;  . REMOVAL OF BILATERAL TISSUE EXPANDERS WITH PLACEMENT OF BILATERAL BREAST IMPLANTS Bilateral 10/13/2019   Procedure: REMOVAL OF BILATERAL TISSUE EXPANDERS WITH PLACEMENT OF BILATERAL BREAST IMPLANTS;  Surgeon: Wallace Going, DO;  Location: [place];  Service: Plastics;  Laterality: Bilateral;  2 hours please  . SIMPLE MASTECTOMY WITH AXILLARY SENTINEL NODE BIOPSY Left 01/14/2019   Procedure: LEFT SIMPLE MASTECTOMY;  Surgeon: Herbert Pun, MD;  Location: [facility];  Service: General;  Laterality: Left;  . TOTAL LAPAROSCOPIC HYSTERECTOMY WITH BILATERAL SALPINGO OOPHORECTOMY Bilateral 08/05/2019   Procedure: TOTAL LAPAROSCOPIC HYSTERECTOMY WITH BILATERAL SALPINGO OOPHORECTOMY;  Surgeon: Homero Fellers, MD;  Location: [facility];  Service: Gynecology;  Laterality: Bilateral;  . TOTAL MASTECTOMY Right 01/14/2019   Procedure: RIGHT TOTAL MASTECTOMY;  Surgeon: Herbert Pun, MD;  Location: [facility];  Service: General;  Laterality: Right;  . WISDOM TOOTH EXTRACTION      Social History   Tobacco Use  . Smoking status: Current Some Day Smoker    Packs/day: 0.05    Years: 20.00    Pack years: 1.00    Types: Cigarettes  Last attempt to quit: 01/07/2019    Years since quitting: 0.8  . Smokeless tobacco: Never Used  . Tobacco comment: 4 cigs a day.   Vaping Use  . Vaping Use: Never used  Substance Use Topics  . Alcohol use: Yes    Alcohol/week: 2.0 standard drinks    Types: 2 Standard drinks or equivalent per week    Comment: occasional  . Drug use: No     Medication list has been reviewed and updated.  Current Meds  Medication Sig  . acetaminophen (TYLENOL) 500 MG tablet Take 2 tablets (1,000 mg total) by mouth every 6 (six)  hours as needed for mild pain.  [place] Kitchen anastrozole (ARIMIDEX) 1 MG tablet Take 1 tablet by mouth once daily  . cetirizine (ZYRTEC) 10 MG tablet Take 10 mg by mouth daily.  . DULoxetine (CYMBALTA) 60 MG capsule Take 1 capsule (60 mg total) by mouth daily.  Mar[place] Kitchen lisinopril-hydrochlorothiazide (ZESTORETIC) 20-25 MG tablet Take 1 tablet by mouth daily.  . multivitamin-iron-minerals-folic acid (CENTRUM) chewable tablet Chew 1 tablet by mouth daily.  Mar[place] Kitchen omeprazole (PRILOSEC) 40 MG capsule Take 1 capsule by mouth once daily    PHQ 2/9 Scores 11/25/2019 06/01/2019 11/23/2018 11/20/2017  PHQ - 2 Score 0 4 0 0  PHQ- 9 Score 1 10 0 -    GAD 7 : Generalized Anxiety Score 11/25/2019 06/01/2019  Nervous, Anxious, on Edge 1 0  Control/stop worrying 0 0  Worry too much - different things 0 0  Trouble relaxing 0 0  Restless 0 0  Easily annoyed or irritable 0 0  Afraid - awful might happen 0 0  Total GAD 7 Score 1 0  Anxiety Difficulty Not difficult at all Not difficult at all    BP Readings from Last 3 Encounters:  11/25/19 114/82  11/09/19 133/82  10/22/19 123/80    Physical Exam Vitals and nursing note reviewed.  Constitutional:      General: She is not in acute distress.    Appearance: She is well-developed.  HENT:     Head: Normocephalic and atraumatic.     Right Ear: Tympanic membrane and ear canal normal.     Left Ear: Tympanic membrane and ear canal normal.     Nose:     Right Sinus: No maxillary sinus tenderness.     Left Sinus: No maxillary sinus tenderness.  Eyes:     General: No scleral icterus.       Right eye: No discharge.        Left eye: No discharge.     Conjunctiva/sclera: Conjunctivae normal.  Neck:     Thyroid: No thyromegaly.     Vascular: No carotid bruit.  Cardiovascular:     Rate and Rhythm: Normal rate and regular rhythm.     Pulses: Normal pulses.     Heart sounds: Normal heart sounds.  Pulmonary:     Effort: Pulmonary effort is normal. No respiratory distress.      Breath sounds: No wheezing.  Abdominal:     General: Bowel sounds are normal.     Palpations: Abdomen is soft.     Tenderness: There is no abdominal tenderness.  Musculoskeletal:     Cervical back: Normal range of motion. No erythema.     Right lower leg: No edema.     Left lower leg: No edema.  Lymphadenopathy:     Cervical: No cervical adenopathy.  Skin:    General: Skin is warm and dry.  Findings: No rash.  Neurological:     Mental Status: She is alert and oriented to person, place, and time.     Cranial Nerves: No cranial nerve deficit.     Sensory: No sensory deficit.     Deep Tendon Reflexes: Reflexes are normal and symmetric.  Psychiatric:        Attention and Perception: Attention normal.        Mood and Affect: Mood normal.     Wt Readings from Last 3 Encounters:  11/25/19 153 lb (69.4 kg)  10/13/19 153 lb 14.1 oz (69.8 kg)  10/06/19 153 lb 4.8 oz (69.5 kg)    BP 114/82   Pulse 80   Temp 98.2 F (36.8 C) (Oral)   Ht 5' 4" (1.626 m)   Wt 153 lb (69.4 kg)   SpO2 96%   BMI 26.26 kg/m   Assessment and Plan: 1. Annual physical exam Normal exam Continue healthy diet and exercise Oncology plans to order a DEXA - POCT urinalysis dipstick - VITAMIN D 25 Hydroxy (Vit-D Deficiency, Fractures) - Hemoglobin A1c  2. Essential hypertension Clinically stable exam with well controlled BP with the addition of HCTZ last visit. Tolerating medications without side effects at this time. Pt to continue current regimen and low sodium diet; benefits of regular exercise as able discussed. - TSH  3. Hx of abnormal cervical Pap smear 2 consecutive paps have been normal She is now s/p total hysterectomy  4. Malignant neoplasm of lower-outer quadrant of right breast of female, estrogen receptor positive ([place]) Followed by Oncology  5. Tubular adenoma of colon On recent colonoscopy Repeat 5 yrs  6. Hyperlipidemia, mixed - Lipid panel   Partially dictated using  Editor, commissioning. Any errors are unintentional.  Halina Maidens, MD [facility]  11/25/2019

## 2019-11-26 ENCOUNTER — Encounter: Payer: Self-pay | Admitting: Internal Medicine

## 2019-11-26 ENCOUNTER — Other Ambulatory Visit: Payer: Self-pay

## 2019-11-26 ENCOUNTER — Encounter: Payer: Self-pay | Admitting: Plastic Surgery

## 2019-11-26 ENCOUNTER — Ambulatory Visit (INDEPENDENT_AMBULATORY_CARE_PROVIDER_SITE_OTHER): Payer: BC Managed Care – PPO | Admitting: Plastic Surgery

## 2019-11-26 VITALS — BP 116/82 | HR 76 | Temp 98.6°F

## 2019-11-26 DIAGNOSIS — R7303 Prediabetes: Secondary | ICD-10-CM | POA: Insufficient documentation

## 2019-11-26 DIAGNOSIS — E559 Vitamin D deficiency, unspecified: Secondary | ICD-10-CM | POA: Insufficient documentation

## 2019-11-26 DIAGNOSIS — Z9889 Other specified postprocedural states: Secondary | ICD-10-CM

## 2019-11-26 LAB — LIPID PANEL
Chol/HDL Ratio: 4.5 ratio — ABNORMAL HIGH (ref 0.0–4.4)
Cholesterol, Total: 264 mg/dL — ABNORMAL HIGH (ref 100–199)
HDL: 59 mg/dL (ref >39)
LDL Chol Calc (NIH): 183 mg/dL — ABNORMAL HIGH (ref 0–99)
Triglycerides: 121 mg/dL (ref 0–149)
VLDL Cholesterol Cal: 22 mg/dL (ref 5–40)

## 2019-11-26 LAB — HEMOGLOBIN A1C
Est. average glucose Bld gHb Est-mCnc: 123 mg/dL
Hgb A1c MFr Bld: 5.9 % — ABNORMAL HIGH (ref 4.8–5.6)

## 2019-11-26 LAB — TSH: TSH: 1.25 u[IU]/mL (ref 0.450–4.500)

## 2019-11-26 LAB — VITAMIN D 25 HYDROXY (VIT D DEFICIENCY, FRACTURES): Vit D, 25-Hydroxy: 20.5 ng/mL — ABNORMAL LOW (ref 30.0–100.0)

## 2019-11-26 NOTE — Progress Notes (Signed)
The patient is a 54 year old female here for follow-up on her bilateral breast reconstruction.  She has implants in place.  She is still concerned that it does not look good and that there is a difference in size.  She thinks the right side still looks smaller than the left.  We reviewed her implant sizes.  She has 455 cc implant on the and a 480 cc implant on the right.  I am concerned that if I had gone any larger on the right it would have been larger than the left and not equal.  This is based on what I did in the OR to examine the sizes with the implant sizers.  She does not have any sign of a hematoma or seroma.  Her incisions are healing nicely.  I removed 1 stitch on the left incision.  We talked about skin [place] as an option for scar therapy.  She also spent some time talking with [facility] about nipple areola tattooing.  I think this would help dramatically for giving her the appearance of normal breasts.  I would like to see her back in 2 to 3 months.  We can get her set up for a tattoo as well.  Pictures were obtained of the patient and placed in the chart with the patient's or guardian's permission.

## 2019-12-14 ENCOUNTER — Encounter: Payer: Self-pay | Admitting: Plastic Surgery

## 2019-12-20 ENCOUNTER — Inpatient Hospital Stay: Payer: BC Managed Care – PPO

## 2020-01-31 ENCOUNTER — Inpatient Hospital Stay: Payer: BC Managed Care – PPO

## 2020-02-07 ENCOUNTER — Telehealth: Payer: Self-pay

## 2020-02-07 ENCOUNTER — Inpatient Hospital Stay (HOSPITAL_BASED_OUTPATIENT_CLINIC_OR_DEPARTMENT_OTHER): Payer: BC Managed Care – PPO | Admitting: Oncology

## 2020-02-07 ENCOUNTER — Encounter: Payer: Self-pay | Admitting: Oncology

## 2020-02-07 ENCOUNTER — Inpatient Hospital Stay: Payer: BC Managed Care – PPO | Attending: Oncology

## 2020-02-07 ENCOUNTER — Other Ambulatory Visit: Payer: Self-pay

## 2020-02-07 VITALS — BP 124/83 | HR 71 | Temp 96.7°F | Resp 16 | Wt 154.1 lb

## 2020-02-07 DIAGNOSIS — Z170 Estrogen receptor positive status [ER+]: Secondary | ICD-10-CM | POA: Diagnosis not present

## 2020-02-07 DIAGNOSIS — Z90710 Acquired absence of both cervix and uterus: Secondary | ICD-10-CM | POA: Diagnosis not present

## 2020-02-07 DIAGNOSIS — Z803 Family history of malignant neoplasm of breast: Secondary | ICD-10-CM | POA: Diagnosis not present

## 2020-02-07 DIAGNOSIS — Z1509 Genetic susceptibility to other malignant neoplasm: Secondary | ICD-10-CM

## 2020-02-07 DIAGNOSIS — Z800 Family history of malignant neoplasm of digestive organs: Secondary | ICD-10-CM | POA: Insufficient documentation

## 2020-02-07 DIAGNOSIS — Z8249 Family history of ischemic heart disease and other diseases of the circulatory system: Secondary | ICD-10-CM | POA: Insufficient documentation

## 2020-02-07 DIAGNOSIS — Z1502 Genetic susceptibility to malignant neoplasm of ovary: Secondary | ICD-10-CM

## 2020-02-07 DIAGNOSIS — Z818 Family history of other mental and behavioral disorders: Secondary | ICD-10-CM | POA: Diagnosis not present

## 2020-02-07 DIAGNOSIS — Z801 Family history of malignant neoplasm of trachea, bronchus and lung: Secondary | ICD-10-CM | POA: Insufficient documentation

## 2020-02-07 DIAGNOSIS — Z823 Family history of stroke: Secondary | ICD-10-CM | POA: Diagnosis not present

## 2020-02-07 DIAGNOSIS — C50511 Malignant neoplasm of lower-outer quadrant of right female breast: Secondary | ICD-10-CM

## 2020-02-07 DIAGNOSIS — F17210 Nicotine dependence, cigarettes, uncomplicated: Secondary | ICD-10-CM | POA: Diagnosis not present

## 2020-02-07 DIAGNOSIS — Z79811 Long term (current) use of aromatase inhibitors: Secondary | ICD-10-CM

## 2020-02-07 DIAGNOSIS — Z1501 Genetic susceptibility to malignant neoplasm of breast: Secondary | ICD-10-CM | POA: Diagnosis not present

## 2020-02-07 DIAGNOSIS — Z86010 Personal history of colonic polyps: Secondary | ICD-10-CM | POA: Diagnosis not present

## 2020-02-07 DIAGNOSIS — Z90722 Acquired absence of ovaries, bilateral: Secondary | ICD-10-CM | POA: Diagnosis not present

## 2020-02-07 LAB — COMPREHENSIVE METABOLIC PANEL
ALT: 13 U/L (ref 0–44)
AST: 19 U/L (ref 15–41)
Albumin: 4.1 g/dL (ref 3.5–5.0)
Alkaline Phosphatase: 69 U/L (ref 38–126)
Anion gap: 11 (ref 5–15)
BUN: 10 mg/dL (ref 6–20)
CO2: 24 mmol/L (ref 22–32)
Calcium: 9.4 mg/dL (ref 8.9–10.3)
Chloride: 101 mmol/L (ref 98–111)
Creatinine, Ser: 0.73 mg/dL (ref 0.44–1.00)
GFR, Estimated: >60 mL/min (ref >60)
Glucose, Bld: 128 mg/dL — ABNORMAL HIGH (ref 70–99)
Potassium: 3.9 mmol/L (ref 3.5–5.1)
Sodium: 136 mmol/L (ref 135–145)
Total Bilirubin: 0.6 mg/dL (ref 0.3–1.2)
Total Protein: 7.1 g/dL (ref 6.5–8.1)

## 2020-02-07 LAB — CBC WITH DIFFERENTIAL/PLATELET
Abs Immature Granulocytes: 0.03 10*3/uL (ref 0.00–0.07)
Basophils Absolute: 0.1 10*3/uL (ref 0.0–0.1)
Basophils Relative: 1 %
Eosinophils Absolute: 0.3 10*3/uL (ref 0.0–0.5)
Eosinophils Relative: 3 %
HCT: 40.2 % (ref 36.0–46.0)
Hemoglobin: 13.9 g/dL (ref 12.0–15.0)
Immature Granulocytes: 0 %
Lymphocytes Relative: 38 %
Lymphs Abs: 3.6 10*3/uL (ref 0.7–4.0)
MCH: 31.8 pg (ref 26.0–34.0)
MCHC: 34.6 g/dL (ref 30.0–36.0)
MCV: 92 fL (ref 80.0–100.0)
Monocytes Absolute: 0.7 10*3/uL (ref 0.1–1.0)
Monocytes Relative: 7 %
Neutro Abs: 4.7 10*3/uL (ref 1.7–7.7)
Neutrophils Relative %: 51 %
Platelets: 297 10*3/uL (ref 150–400)
RBC: 4.37 MIL/uL (ref 3.87–5.11)
RDW: 13.3 % (ref 11.5–15.5)
WBC: 9.3 10*3/uL (ref 4.0–10.5)
nRBC: 0 % (ref 0.0–0.2)

## 2020-02-07 MED ORDER — DULOXETINE HCL 60 MG PO CPEP: 60.0000 mg | ORAL_CAPSULE | Freq: Every day | ORAL | 0 refills | Status: DC

## 2020-02-07 MED ORDER — ANASTROZOLE 1 MG PO TABS: 1.0000 mg | ORAL_TABLET | Freq: Every day | ORAL | 0 refills | Status: DC

## 2020-02-07 NOTE — Telephone Encounter (Signed)
Communication documented in [place] message.  Refill for Duloxetine 60 mg QD sent to pharmacy

## 2020-02-07 NOTE — Progress Notes (Signed)
Hematology/Oncology follow up  note [facility] Telephone:(336) [phone] Fax:(336) [phone]   Patient Care Team: Glean Hess, MD as PCP - General (Internal Medicine) Jannet Mantis, MD (Dermatology) Rico Junker, RN as Registered Nurse Herbert Pun, MD as Consulting Physician (General Surgery) Earlie Server, MD as Consulting Physician (Oncology) Dillingham, Loel Lofty, DO as Attending Physician (Plastic Surgery)  CHIEF COMPLAINTS/REASON FOR VISIT:  Follow-up for breast cancer.  HISTORY OF PRESENTING ILLNESS:  Cheryl Mooney is a  54 y.o.  female with PMH listed below who was referred to me for evaluation of breast cancer Patient reports feeling mass of her right breast week before her annual mammogram. 12/03/2018 patient had bilateral diagnostic mammogram and ultrasound  which showed suspicious 2.1 x 1.3 x 1.6 irregular mass at 7:00 in the right breast, 2 cm from the nipple. 2 borderline lymph nodes are seen in the right axilla.  1 of the nodes demonstrate a cortex of 4.4 mm.  Both lymph nodes demonstrated retention of fatty hila. Patient underwent ultrasound-guided biopsy of right breast mass as well as ultrasound-guided biopsy of 1 of the 2 mildly abnormal right axillary lymph nodes. 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 Right axilla lymph node negative for malignancy. ER> 90% positive, PR11-50% positive, HER-2 equivocal, 2+ by IHC.  FISH negative.   Nipple discharge: Denies Family history: Mother diagnosed with breast cancer at age of 43, and colon cancer at age of 29.  Mother has BRCA2 mutation.   Maternal grandmother breast cancer, maternal great aunt breast cancer.  Patient recalls that she was tested long time ago and was not aware of any results.  OCP use: In her 20s-30s Estrogen and progesterone therapy: denies History of radiation to chest: denies.   Previous breast surgery: Denies  BRCA 2 positive.  # 01/14/2019 patient underwent bilateral mastectomy with sentinel lymph node biopsy of left and the right axillary. Right breast showed invasive mammary carcinoma, DCIS positive, apocrine metaplastic, stromal fibrosis, duct ectasia, simple cyst formation, usual epithelial hyperplasia.  Sentinel lymph node on the right side was negative. pT2 pN0 Left prophylactic mastectomy and a sentinel lymph node negative for malignancy.  #OncotypeDX recurrence score 31, adjuvant chemotherapy benefit more than 15%. # She follows up with plastic surgeon Dr. Marla Roe, she has expander, expanding process is being during chemotherapy. Mediport was placed by Dr. Peyton Najjar on 02/18/2019.  Patient had 1 cycle of AC, chemotherapy plan was switched to docetaxel and carboplatin after consulting expert opinion. She has finished 4 cycle of docetaxel and carboplatin. May 2021, patient underwent laparoscopic-assisted vaginal hysterectomy with bilateralsalpingectomy and oophorectomy  INTERVAL HISTORY Cheryl Mooney is a 54 y.o. female who has above history reviewed by me today presents for follow up visit for management of stage I breast cancer, ER PR positive, HER-2 negative, hereditary BRCA 2 mutation.  Problems and complaints are listed below: Patient takes Arimidex daily.   She tolerates well.  Manageable symptoms.  No new concerns.  status post bilateral breast reconstruction on 10/13/2019 and she follows up with plastic surgeon.  Last seen by Dr. Elisabeth Cara on 11/26/2019.  ..   Review of Systems  Constitutional: Negative for appetite change, chills, fatigue and fever.  HENT:   Negative for hearing loss and voice change.   Eyes: Negative for eye problems.  Respiratory: Negative for chest tightness and cough.   Cardiovascular: Negative for chest pain.  Gastrointestinal: Negative for abdominal distention, abdominal pain,  blood in stool and diarrhea.  Endocrine:  Positive for hot flashes.  Genitourinary: Negative for difficulty urinating and frequency.   Musculoskeletal: Negative for arthralgias.  Skin: Negative for itching and rash.  Neurological: Negative for extremity weakness.  Hematological: Negative for adenopathy.  Psychiatric/Behavioral: Negative for confusion. The patient is not nervous/anxious.     MEDICAL HISTORY:  Past Medical History:  Diagnosis Date  . Anemia    things seem back to normal  . Benign neoplasm of sigmoid colon   . Benign neoplasm of transverse colon   . Breast cancer ([place])   . Depression   . Family history of BRCA2 gene positive   . Family history of breast cancer   . Family history of colon cancer   . Family history of lung cancer   . Family history of pancreatic cancer   . GERD (gastroesophageal reflux disease)   . History of kidney stones   . Hypertension   . Malignant neoplasm of lower-outer quadrant of right breast of female, estrogen receptor positive ([place]) 12/17/2018  . Smoker     SURGICAL HISTORY: Past Surgical History:  Procedure Laterality Date  . AXILLARY SENTINEL NODE BIOPSY Bilateral 01/14/2019   Procedure: AXILLARY SENTINEL NODE BIOPSY;  Surgeon: Herbert Pun, MD;  Location: [facility];  Service: General;  Laterality: Bilateral;  . BREAST BIOPSY Right 12/08/2018   [place] bx of mass with calcs path pending  . BREAST BIOPSY Right 12/08/2018   [place] bx of LN, path pending  . BREAST RECONSTRUCTION WITH PLACEMENT OF TISSUE EXPANDER AND ALLODERM Bilateral 01/14/2019   Procedure: BILATERAL BREAST RECONSTRUCTION WITH PLACEMENT OF TISSUE EXPANDER AND FLEX HD;  Surgeon: Wallace Going, DO;  Location: [facility];  Service: Plastics;  Laterality: Bilateral;  . COLONOSCOPY WITH PROPOFOL N/A 02/17/2015   Procedure: COLONOSCOPY WITH PROPOFOL;  Surgeon: Lucilla Lame, MD;  Location: [place];  Service: Endoscopy;  Laterality: N/A;  . COLONOSCOPY WITH PROPOFOL N/A 07/12/2019   Procedure: COLONOSCOPY  WITH BIOPSY;  Surgeon: Lucilla Lame, MD;  Location: [place];  Service: Endoscopy;  Laterality: N/A;  priority 3 PORT A CATH NEEDS ACCESSED  . CYSTOSCOPY N/A 08/05/2019   Procedure: CYSTOSCOPY;  Surgeon: Homero Fellers, MD;  Location: [facility];  Service: Gynecology;  Laterality: N/A;  . POLYPECTOMY  02/17/2015   Procedure: POLYPECTOMY;  Surgeon: Lucilla Lame, MD;  Location: [place];  Service: Endoscopy;;  . POLYPECTOMY N/A 07/12/2019   Procedure: POLYPECTOMY;  Surgeon: Lucilla Lame, MD;  Location: [place];  Service: Endoscopy;  Laterality: N/A;  . PORTA CATH REMOVAL Left 10/13/2019   Procedure: PORTA CATH REMOVAL;  Surgeon: Wallace Going, DO;  Location: [place];  Service: Plastics;  Laterality: Left;  . PORTACATH PLACEMENT Left 02/18/2019   Procedure: INSERTION PORT-A-CATH;  Surgeon: Herbert Pun, MD;  Location: [facility];  Service: General;  Laterality: Left;  . REMOVAL OF BILATERAL TISSUE EXPANDERS WITH PLACEMENT OF BILATERAL BREAST IMPLANTS Bilateral 10/13/2019   Procedure: REMOVAL OF BILATERAL TISSUE EXPANDERS WITH PLACEMENT OF BILATERAL BREAST IMPLANTS;  Surgeon: Wallace Going, DO;  Location: [place];  Service: Plastics;  Laterality: Bilateral;  2 hours please  . SIMPLE MASTECTOMY WITH AXILLARY SENTINEL NODE BIOPSY Left 01/14/2019   Procedure: LEFT SIMPLE MASTECTOMY;  Surgeon: Herbert Pun, MD;  Location: [facility];  Service: General;  Laterality: Left;  . TOTAL LAPAROSCOPIC HYSTERECTOMY WITH BILATERAL SALPINGO OOPHORECTOMY Bilateral 08/05/2019   Procedure: TOTAL LAPAROSCOPIC HYSTERECTOMY WITH BILATERAL SALPINGO OOPHORECTOMY;  Surgeon: Homero Fellers, MD;  Location: [facility];  Service: Gynecology;  Laterality: Bilateral;  . TOTAL MASTECTOMY Right 01/14/2019   Procedure: RIGHT TOTAL MASTECTOMY;  Surgeon: Herbert Pun, MD;  Location: [facility];  Service: General;  Laterality: Right;   . WISDOM TOOTH EXTRACTION      SOCIAL HISTORY: Social History   Socioeconomic History  . Marital status: Married    Spouse name: Jenny Reichmann  . Number of children: Not on file  . Years of education: Not on file  . Highest education level: Not on file  Occupational History  . Occupation: Physicist, medical    Comment: retired  Tobacco Use  . Smoking status: Current Some Day Smoker    Packs/day: 0.05    Years: 20.00    Pack years: 1.00    Types: Cigarettes    Last attempt to quit: 01/07/2019    Years since quitting: 1.0  . Smokeless tobacco: Never Used  . Tobacco comment: 4 cigs a day.   Vaping Use  . Vaping Use: Never used  Substance and Sexual Activity  . Alcohol use: Yes    Alcohol/week: 2.0 standard drinks    Types: 2 Standard drinks or equivalent per week    Comment: occasional  . Drug use: No  . Sexual activity: Yes    Birth control/protection: Surgical  Other Topics Concern  . Not on file  Social History Narrative   Patient lives with husband.   Social Determinants of Health   Financial Resource Strain:   . Difficulty of Paying Living Expenses: Not on file  Food Insecurity:   . Worried About Charity fundraiser in the Last Year: Not on file  . Ran Out of Food in the Last Year: Not on file  Transportation Needs:   . Lack of Transportation (Medical): Not on file  . Lack of Transportation (Non-Medical): Not on file  Physical Activity:   . Days of Exercise per Week: Not on file  . Minutes of Exercise per Session: Not on file  Stress:   . Feeling of Stress : Not on file  Social Connections:   . Frequency of Communication with Friends and Family: Not on file  . Frequency of Social Gatherings with Friends and Family: Not on file  . Attends Religious Services: Not on file  . Active Member of Clubs or Organizations: Not on file  . Attends Archivist Meetings: Not on file  . Marital Status: Not on file  Intimate Partner Violence:   . Fear of Current or  Ex-Partner: Not on file  . Emotionally Abused: Not on file  . Physically Abused: Not on file  . Sexually Abused: Not on file  She is a retired Pharmacist, hospital  FAMILY HISTORY: Family History  Problem Relation Age of Onset  . Colon cancer Mother 81  . Breast cancer Mother 60       "BRCA2+"  . Hypertension Father   . Stroke Father   . Alzheimer's disease Father   . Prostate cancer Father   . Breast cancer Maternal Aunt        mat great aunt  . Breast cancer Maternal Grandmother 16  . Pancreatic cancer Maternal Grandfather 60    ALLERGIES:  has No Known Allergies.  MEDICATIONS:  Current Outpatient Medications  Medication Sig Dispense Refill  . acetaminophen (TYLENOL) 500 MG tablet Take 2 tablets (1,000 mg total) by mouth every 6 (six) hours as needed for mild pain. 30 tablet 0  . anastrozole (  ARIMIDEX) 1 MG tablet Take 1 tablet (1 mg total) by mouth daily. 90 tablet 0  . cetirizine (ZYRTEC) 10 MG tablet Take 10 mg by mouth daily.    Marland Kitchen lisinopril-hydrochlorothiazide (ZESTORETIC) 20-25 MG tablet Take 1 tablet by mouth daily. 90 tablet 1  . multivitamin-iron-minerals-folic acid (CENTRUM) chewable tablet Chew 1 tablet by mouth daily.    Marland Kitchen omeprazole (PRILOSEC) 40 MG capsule Take 1 capsule by mouth once daily 90 capsule 3  . DULoxetine (CYMBALTA) 60 MG capsule Take 1 capsule (60 mg total) by mouth daily. 90 capsule 0   No current facility-administered medications for this visit.     PHYSICAL EXAMINATION: ECOG PERFORMANCE STATUS: 0 - Asymptomatic Vitals:   02/07/20 1007  BP: 124/83  Pulse: 71  Resp: 16  Temp: (!) 96.7 F (35.9 C)   Filed Weights   02/07/20 1007  Weight: 154 lb 1.6 oz (69.9 kg)    Physical Exam Constitutional:      General: She is not in acute distress. HENT:     Head: Normocephalic and atraumatic.  Eyes:     General: No scleral icterus.    Pupils: Pupils are equal, round, and reactive to light.  Cardiovascular:     Rate and Rhythm: Normal rate and  regular rhythm.     Heart sounds: Normal heart sounds.  Pulmonary:     Effort: Pulmonary effort is normal. No respiratory distress.     Breath sounds: No wheezing.  Abdominal:     General: Bowel sounds are normal. There is no distension.     Palpations: Abdomen is soft. There is no mass.     Tenderness: There is no abdominal tenderness.  Musculoskeletal:        General: No deformity. Normal range of motion.     Cervical back: Normal range of motion and neck supple.  Skin:    General: Skin is warm and dry.     Findings: No erythema or rash.  Neurological:     Mental Status: She is alert and oriented to person, place, and time. Mental status is at baseline.     Cranial Nerves: No cranial nerve deficit.     Coordination: Coordination normal.  Psychiatric:        Mood and Affect: Mood normal.   Breast exam is performed in seated and lying down position. Patient is status post bilateral mastectomy with implants.  The edges are intact and there is no evidence of any chest wall recurrence.  No palpable bilateral axillary adenopathy   LABORATORY DATA:  I have reviewed the data as listed Lab Results  Component Value Date   WBC 9.3 02/07/2020   HGB 13.9 02/07/2020   HCT 40.2 02/07/2020   MCV 92.0 02/07/2020   PLT 297 02/07/2020   Recent Labs    07/05/19 1309 07/05/19 1309 08/24/19 1319 10/06/19 1308 02/07/20 0955  NA 136   < > 133* 135 136  K 3.5   < > 3.4* 3.5 3.9  CL 105   < > 99 100 101  CO2 23   < > 24 24 24   GLUCOSE 158*   < > 189* 147* 128*  BUN 8   < > 8 8 10   CREATININE 0.61   < > 0.71 0.61 0.73  CALCIUM 9.1   < > 9.5 9.2 9.4  GFRNONAA >60   < > >60 >60 >60  GFRAA >60  --  >60 >60  --   PROT 7.1   < >  7.4 7.6 7.1  ALBUMIN 4.1   < > 4.3 4.3 4.1  AST 19   < > 19 19 19   ALT 14   < > 13 13 13   ALKPHOS 103   < > 102 82 69  BILITOT 0.3   < > 0.5 0.7 0.6   < > = values in this interval not displayed.   Iron/TIBC/Ferritin/ %Sat No results found for: IRON, TIBC,  FERRITIN, IRONPCTSAT   RADIOGRAPHIC STUDIES: I have personally reviewed the radiological images as listed and agreed with the findings in the report. No results found.    ASSESSMENT & PLAN:  1. Malignant neoplasm of lower-outer quadrant of right breast of female, estrogen receptor positive ([place])   2. BRCA2 gene mutation positive in female   3. Aromatase inhibitor use   4. Family history of pancreatic cancer    Stage IB pT2 pN0 M0 ER + PR+ HER2 negative right breast cancer, grade 3, +LVI,   -BRCA2 positive So far she tolerates adjuvant endocrine therapy with Arimidex.  Continue current regimen. Recommend patient to continue calcium supplementation as well as vitamin D supplementation. Previously discussed about DEXA and patient want to defer at that time.  Now she is agreeable with proceeding with DEXA evaluation.  We will proceed in the range Recommend adjuvant Zometa every 6 months.  Received Zometa on 08/24/2019.  We will proceed with next dose around 02/24/2020.  #BRCA2 gene mutation positive,status post hysterectomy with bilateral salpingo-oophorectomy. She has a family history of pancreatic cancer.  I recommend patient to consider pancreatic cancer screening with MRI abdomen.  She agrees.  Will arrange.  #Port-A-Cath in place, recommend port flush every 6 to 8 weeks.  Continue port flush.  I discussed about keeping port 1 year after surgery.  She agrees.  All questions were answered. The patient knows to call the clinic with any problems questions or concerns. Follow-up in 3 months.   Earlie Server, MD, PhD Hematology Oncology [facility] Pager- [phone] 02/07/2020

## 2020-02-07 NOTE — Telephone Encounter (Signed)
Patient acquired about a refill on the Duloxetine 60mg  QD as leaving MD appt today.  Dr. Tasia Catchings would like an update on how her neuropathy is doing and if stable she may want her to decrease the Duloxetine to 30mg  QD.  I have left a message on patients voicemail and also sent her a MyChart message.

## 2020-02-07 NOTE — Progress Notes (Signed)
Patient denies new problems/concerns today.   °

## 2020-02-08 ENCOUNTER — Telehealth: Payer: Self-pay

## 2020-02-08 DIAGNOSIS — Z170 Estrogen receptor positive status [ER+]: Secondary | ICD-10-CM

## 2020-02-08 DIAGNOSIS — C50511 Malignant neoplasm of lower-outer quadrant of right female breast: Secondary | ICD-10-CM

## 2020-02-08 NOTE — Telephone Encounter (Signed)
Patient notified via [place].   Cheryl Mooney, please schedule lab (bmp) /zometa around 11/18 and adjust future appts (lab/MD) to be 3 months from 11/18.

## 2020-02-08 NOTE — Telephone Encounter (Signed)
-----   Message from Earlie Server, MD sent at 02/07/2020  4:56 PM EDT ----- Please arrange her to do Zometa around 11/18, do bmp prior.  Also please adjust her next appt to 3 months from 11/18, so that in the future her 6 months follow up will be synchronized with Zometa in 6 months.

## 2020-02-08 NOTE — Telephone Encounter (Signed)
Done... Appt has been scheduled as requested A detailed message was left on pts VM making her aware of the sched dates and times

## 2020-02-11 ENCOUNTER — Telehealth: Payer: Self-pay

## 2020-02-11 DIAGNOSIS — Z8481 Family history of carrier of genetic disease: Secondary | ICD-10-CM

## 2020-02-11 DIAGNOSIS — Z800 Family history of malignant neoplasm of digestive organs: Secondary | ICD-10-CM

## 2020-02-11 DIAGNOSIS — Z1501 Genetic susceptibility to malignant neoplasm of breast: Secondary | ICD-10-CM

## 2020-02-11 NOTE — Telephone Encounter (Signed)
  Done.Marland Kitchen MRI was cx due to denial as requested. Order changed to CT ABD has been sched for 02/21/20 @ 9am I was unable to reach pt by phone but I did leave a detailed message  on her VM making her aware of the sched appt location, date, time  NPO after midnight and to pick up prep kit before appt date. A a reminder letter will be mailed out also

## 2020-02-11 NOTE — Telephone Encounter (Signed)
Please change MRI appt to CT and inform patient if appt details change.  Patient notified via MyChart of the image change.

## 2020-02-11 NOTE — Telephone Encounter (Signed)
-----   Message from Earlie Server, MD sent at 02/10/2020  4:41 PM EDT ----- Regarding: RE: MRI Denied Please change order to CT abdomen w contrast (pancrease protocol if we have it here) ----- Message ----- From: Elliot Gault Sent: 02/10/2020   4:33 PM EDT To: Maryann Conners, NT, Vanice Sarah, CMA, # Subject: MRI Denied                                     Good afternoon - [facility] has denied the request for MRI. They are wanting the patient to have CT scans first. Do you want to request an appeal or schedule CT's?  Thank you  Velna Hatchet

## 2020-02-16 ENCOUNTER — Telehealth: Payer: Self-pay

## 2020-02-16 NOTE — Telephone Encounter (Signed)
Done.. 02/21/20 CT scan has been cx and MRI ABD wwo has been sched as requested. A detailed message was left on pts VM making her aware of the location, date and time of her 03/01/20 appt A reminder letter will be mailed out as well

## 2020-02-16 NOTE — Telephone Encounter (Signed)
Please cx the CT and change to MRI and inform patient of any appt date/time change.  I updated of scan change via MyChart.   Message received that MRI is now approved:   Oswaldo Milian, MD; Vanice Sarah, CMA; [place], [place] R, [place] Good Morning! I am not sure what happened but the MRI abd has been approved now. [facility] withdrew my request for CT abd and approved the MRI instead.  So sorry for the confusion.   Thanks   [employer]

## 2020-02-17 NOTE — Telephone Encounter (Signed)
Left message informing her of CT changed back to MRI.

## 2020-02-18 ENCOUNTER — Ambulatory Visit: Payer: BC Managed Care – PPO

## 2020-02-21 ENCOUNTER — Ambulatory Visit: Payer: BC Managed Care – PPO

## 2020-02-22 ENCOUNTER — Encounter: Payer: Self-pay | Admitting: Plastic Surgery

## 2020-02-22 ENCOUNTER — Ambulatory Visit (INDEPENDENT_AMBULATORY_CARE_PROVIDER_SITE_OTHER): Payer: BC Managed Care – PPO | Admitting: Plastic Surgery

## 2020-02-22 ENCOUNTER — Other Ambulatory Visit: Payer: Self-pay

## 2020-02-22 VITALS — BP 139/89 | HR 78 | Temp 98.2°F

## 2020-02-22 DIAGNOSIS — Z9013 Acquired absence of bilateral breasts and nipples: Secondary | ICD-10-CM

## 2020-02-22 DIAGNOSIS — Z9889 Other specified postprocedural states: Secondary | ICD-10-CM

## 2020-02-22 DIAGNOSIS — N651 Disproportion of reconstructed breast: Secondary | ICD-10-CM | POA: Diagnosis not present

## 2020-02-22 NOTE — Progress Notes (Signed)
   Subjective:    Patient ID: Cheryl Mooney, female    DOB: Feb 13, 1966, 54 y.o.   MRN: [id number]  The patient is a 54 year old female here for follow-up on her bilateral breast reconstruction.  She had bilateral mastectomies with expanders placed.  She then had expanders removed and implants placed in July 2021.  The implants are settling in very nicely.  Her scars are nicely healed.  It does not feel like she has any contracture.  She has not had nipple areola tattooing yet but is now interested.  She has a little bit of volume loss in the upper poles medially of both breasts.     Review of Systems  Constitutional: Negative for appetite change.  Eyes: Negative.   Respiratory: Negative.  Negative for chest tightness and shortness of breath.   Cardiovascular: Negative.   Gastrointestinal: Negative.   Endocrine: Negative.   Genitourinary: Negative.   Musculoskeletal: Negative.        Objective:   Physical Exam Vitals and nursing note reviewed.  Constitutional:      Appearance: Normal appearance.  HENT:     Head: Normocephalic and atraumatic.  Cardiovascular:     Rate and Rhythm: Normal rate.     Pulses: Normal pulses.  Pulmonary:     Effort: Pulmonary effort is normal.  Neurological:     General: No focal deficit present.     Mental Status: She is alert and oriented to person, place, and time.  Psychiatric:        Mood and Affect: Mood normal.        Behavior: Behavior normal.        Assessment & Plan:     ICD-10-CM   1. S/P breast reconstruction, bilateral  Z98.890   2. Acquired absence of bilateral breasts and nipples  Z90.13   3. Breast asymmetry following reconstructive surgery  N65.1     The patient is a good candidate for symmetry surgery with Lipo filling for improved symmetry and volume of the upper pole of the bilateral breasts.  We will also get her set up for nipple areola tattoo placement for reconstruction in the office.  She can have either 1 at any  time.  1 does not need to be done before the other.  Pictures were obtained of the patient and placed in the chart with the patient's or guardian's permission.

## 2020-02-24 ENCOUNTER — Inpatient Hospital Stay: Payer: BC Managed Care – PPO

## 2020-02-24 ENCOUNTER — Inpatient Hospital Stay: Payer: BC Managed Care – PPO | Attending: Oncology

## 2020-02-24 ENCOUNTER — Ambulatory Visit: Payer: BC Managed Care – PPO | Admitting: Oncology

## 2020-02-24 ENCOUNTER — Ambulatory Visit: Payer: BC Managed Care – PPO

## 2020-02-24 ENCOUNTER — Other Ambulatory Visit: Payer: BC Managed Care – PPO

## 2020-02-24 ENCOUNTER — Other Ambulatory Visit: Payer: Self-pay

## 2020-02-24 VITALS — BP 129/84 | HR 83 | Temp 96.8°F | Resp 18

## 2020-02-24 DIAGNOSIS — Z90710 Acquired absence of both cervix and uterus: Secondary | ICD-10-CM | POA: Insufficient documentation

## 2020-02-24 DIAGNOSIS — Z86010 Personal history of colonic polyps: Secondary | ICD-10-CM | POA: Insufficient documentation

## 2020-02-24 DIAGNOSIS — Z79811 Long term (current) use of aromatase inhibitors: Secondary | ICD-10-CM | POA: Diagnosis not present

## 2020-02-24 DIAGNOSIS — Z803 Family history of malignant neoplasm of breast: Secondary | ICD-10-CM | POA: Diagnosis not present

## 2020-02-24 DIAGNOSIS — R232 Flushing: Secondary | ICD-10-CM | POA: Diagnosis not present

## 2020-02-24 DIAGNOSIS — C50511 Malignant neoplasm of lower-outer quadrant of right female breast: Secondary | ICD-10-CM | POA: Insufficient documentation

## 2020-02-24 DIAGNOSIS — Z79899 Other long term (current) drug therapy: Secondary | ICD-10-CM | POA: Insufficient documentation

## 2020-02-24 DIAGNOSIS — F17210 Nicotine dependence, cigarettes, uncomplicated: Secondary | ICD-10-CM | POA: Insufficient documentation

## 2020-02-24 DIAGNOSIS — Z818 Family history of other mental and behavioral disorders: Secondary | ICD-10-CM | POA: Insufficient documentation

## 2020-02-24 DIAGNOSIS — C50919 Malignant neoplasm of unspecified site of unspecified female breast: Secondary | ICD-10-CM

## 2020-02-24 DIAGNOSIS — E871 Hypo-osmolality and hyponatremia: Secondary | ICD-10-CM

## 2020-02-24 DIAGNOSIS — Z801 Family history of malignant neoplasm of trachea, bronchus and lung: Secondary | ICD-10-CM | POA: Diagnosis not present

## 2020-02-24 DIAGNOSIS — Z800 Family history of malignant neoplasm of digestive organs: Secondary | ICD-10-CM | POA: Diagnosis not present

## 2020-02-24 DIAGNOSIS — Z170 Estrogen receptor positive status [ER+]: Secondary | ICD-10-CM | POA: Insufficient documentation

## 2020-02-24 DIAGNOSIS — Z90722 Acquired absence of ovaries, bilateral: Secondary | ICD-10-CM | POA: Diagnosis not present

## 2020-02-24 DIAGNOSIS — I10 Essential (primary) hypertension: Secondary | ICD-10-CM | POA: Diagnosis not present

## 2020-02-24 DIAGNOSIS — Z823 Family history of stroke: Secondary | ICD-10-CM | POA: Diagnosis not present

## 2020-02-24 DIAGNOSIS — Z8249 Family history of ischemic heart disease and other diseases of the circulatory system: Secondary | ICD-10-CM | POA: Diagnosis not present

## 2020-02-24 DIAGNOSIS — R1011 Right upper quadrant pain: Secondary | ICD-10-CM | POA: Insufficient documentation

## 2020-02-24 LAB — CBC WITH DIFFERENTIAL/PLATELET
Abs Immature Granulocytes: 0.01 10*3/uL (ref 0.00–0.07)
Basophils Absolute: 0.1 10*3/uL (ref 0.0–0.1)
Basophils Relative: 1 %
Eosinophils Absolute: 0.2 10*3/uL (ref 0.0–0.5)
Eosinophils Relative: 3 %
HCT: 40.3 % (ref 36.0–46.0)
Hemoglobin: 14 g/dL (ref 12.0–15.0)
Immature Granulocytes: 0 %
Lymphocytes Relative: 36 %
Lymphs Abs: 2.7 10*3/uL (ref 0.7–4.0)
MCH: 32 pg (ref 26.0–34.0)
MCHC: 34.7 g/dL (ref 30.0–36.0)
MCV: 92 fL (ref 80.0–100.0)
Monocytes Absolute: 0.4 10*3/uL (ref 0.1–1.0)
Monocytes Relative: 5 %
Neutro Abs: 4.3 10*3/uL (ref 1.7–7.7)
Neutrophils Relative %: 55 %
Platelets: 308 10*3/uL (ref 150–400)
RBC: 4.38 MIL/uL (ref 3.87–5.11)
RDW: 13.2 % (ref 11.5–15.5)
WBC: 7.7 10*3/uL (ref 4.0–10.5)
nRBC: 0 % (ref 0.0–0.2)

## 2020-02-24 LAB — BASIC METABOLIC PANEL
Anion gap: 13 (ref 5–15)
BUN: 9 mg/dL (ref 6–20)
CO2: 25 mmol/L (ref 22–32)
Calcium: 9.7 mg/dL (ref 8.9–10.3)
Chloride: 96 mmol/L — ABNORMAL LOW (ref 98–111)
Creatinine, Ser: 0.71 mg/dL (ref 0.44–1.00)
GFR, Estimated: >60 mL/min (ref >60)
Glucose, Bld: 155 mg/dL — ABNORMAL HIGH (ref 70–99)
Potassium: 3.7 mmol/L (ref 3.5–5.1)
Sodium: 134 mmol/L — ABNORMAL LOW (ref 135–145)

## 2020-02-24 MED ORDER — ZOLEDRONIC ACID 4 MG/100ML IV SOLN
4.0000 mg | Freq: Once | INTRAVENOUS | Status: AC
  Administered 2020-02-24: 4 mg via INTRAVENOUS
  Filled 2020-02-24: qty 100

## 2020-02-24 MED ORDER — SODIUM CHLORIDE 0.9 % IV SOLN
Freq: Once | INTRAVENOUS | Status: AC
  Filled 2020-02-24: qty 250

## 2020-02-24 NOTE — Progress Notes (Signed)
1513- Patient tolerated Zometa infusion well. Patient discharged to home at this time.

## 2020-03-01 ENCOUNTER — Ambulatory Visit
Admission: RE | Admit: 2020-03-01 | Discharge: 2020-03-01 | Disposition: A | Discharge status: Home / Self Care | Payer: BC Managed Care – PPO | Source: Ambulatory Visit | Attending: Oncology | Admitting: Oncology

## 2020-03-01 ENCOUNTER — Other Ambulatory Visit: Payer: Self-pay

## 2020-03-01 DIAGNOSIS — Z170 Estrogen receptor positive status [ER+]: Secondary | ICD-10-CM | POA: Insufficient documentation

## 2020-03-01 DIAGNOSIS — Z1509 Genetic susceptibility to other malignant neoplasm: Secondary | ICD-10-CM

## 2020-03-01 DIAGNOSIS — Z1501 Genetic susceptibility to malignant neoplasm of breast: Secondary | ICD-10-CM | POA: Insufficient documentation

## 2020-03-01 DIAGNOSIS — Z800 Family history of malignant neoplasm of digestive organs: Secondary | ICD-10-CM

## 2020-03-01 DIAGNOSIS — Z1502 Genetic susceptibility to malignant neoplasm of ovary: Secondary | ICD-10-CM | POA: Insufficient documentation

## 2020-03-01 DIAGNOSIS — C50511 Malignant neoplasm of lower-outer quadrant of right female breast: Secondary | ICD-10-CM | POA: Insufficient documentation

## 2020-03-01 DIAGNOSIS — Z95828 Presence of other vascular implants and grafts: Secondary | ICD-10-CM | POA: Diagnosis present

## 2020-03-01 DIAGNOSIS — Z79811 Long term (current) use of aromatase inhibitors: Secondary | ICD-10-CM | POA: Diagnosis present

## 2020-03-01 MED ORDER — GADOBUTROL 1 MMOL/ML IV SOLN
7.0000 mL | Freq: Once | INTRAVENOUS | Status: AC | PRN
  Administered 2020-03-01: 7 mL via INTRAVENOUS

## 2020-03-04 ENCOUNTER — Other Ambulatory Visit: Payer: Self-pay | Admitting: Oncology

## 2020-03-09 NOTE — Progress Notes (Signed)
Patient ID: Cheryl Mooney, female    DOB: Mar 13, 1966, 54 y.o.   MRN: [id number]  Chief Complaint  Patient presents with  . Pre-op Exam      ICD-10-CM   1. S/P breast reconstruction, bilateral  Z98.890   2. Acquired absence of bilateral breasts and nipples  Z90.13     History of Present Illness: Cheryl Mooney is a 54 y.o.  female  with a history of bilateral mastectomies resulting in bilateral breast reconstruction.  She presents for preoperative evaluation for upcoming procedure, liposuction of abdomen for Lipo filling of bilateral breasts, scheduled for 03/16/2020 with Dr. Marla Roe  The patient has not had problems with anesthesia. No history of DVT/PE.  No family history of DVT/PE.  No family or personal history of bleeding or clotting disorders.  Patient is not currently taking any blood thinners.  No history of CVA/MI.   Patient reports that she occasionally smokes cigarettes, last cigarette was yesterday due to receiving some news about a diagnosis of osteoporosis.  She reports that she is going to avoid smoking until surgery and after surgery.  Summary of Previous Visit: Patient had bilateral mastectomies with expanders placed and then expanders exchanged to implants in July 2021.  PMH Significant for: Hypertension, GERD   Past Medical History: Allergies: No Known Allergies  Current Medications:  Current Outpatient Medications:  .  acetaminophen (TYLENOL) 500 MG tablet, Take 2 tablets (1,000 mg total) by mouth every 6 (six) hours as needed for mild pain., Disp: 30 tablet, Rfl: 0 .  anastrozole (ARIMIDEX) 1 MG tablet, Take 1 tablet (1 mg total) by mouth daily., Disp: 90 tablet, Rfl: 0 .  cetirizine (ZYRTEC) 10 MG tablet, Take 10 mg by mouth daily., Disp: , Rfl:  .  DULoxetine (CYMBALTA) 60 MG capsule, Take 1 capsule (60 mg total) by mouth daily., Disp: 90 capsule, Rfl: 0 .  lisinopril-hydrochlorothiazide (ZESTORETIC) 20-25 MG tablet, Take 1 tablet by mouth daily.,  Disp: 90 tablet, Rfl: 1 .  multivitamin-iron-minerals-folic acid (CENTRUM) chewable tablet, Chew 1 tablet by mouth daily., Disp: , Rfl:  .  omeprazole (PRILOSEC) 40 MG capsule, Take 1 capsule by mouth once daily, Disp: 90 capsule, Rfl: 3 .  cephALEXin (KEFLEX) 500 MG capsule, Take 1 capsule (500 mg total) by mouth 4 (four) times daily for 5 days., Disp: 20 capsule, Rfl: 0 .  HYDROcodone-acetaminophen (NORCO) 5-325 MG tablet, Take 1 tablet by mouth every 6 (six) hours as needed for up to 5 days for severe pain., Disp: 20 tablet, Rfl: 0 .  ondansetron (ZOFRAN) 4 MG tablet, Take 1 tablet (4 mg total) by mouth every 8 (eight) hours as needed for nausea or vomiting., Disp: 20 tablet, Rfl: 0  Past Medical Problems: Past Medical History:  Diagnosis Date  . Anemia    things seem back to normal  . Benign neoplasm of sigmoid colon   . Benign neoplasm of transverse colon   . Breast cancer ([place])   . Depression   . Family history of BRCA2 gene positive   . Family history of breast cancer   . Family history of colon cancer   . Family history of lung cancer   . Family history of pancreatic cancer   . GERD (gastroesophageal reflux disease)   . History of kidney stones   . Hypertension   . Malignant neoplasm of lower-outer quadrant of right breast of female, estrogen receptor positive ([place]) 12/17/2018  . Smoker     Past Surgical  History: Past Surgical History:  Procedure Laterality Date  . AXILLARY SENTINEL NODE BIOPSY Bilateral 01/14/2019   Procedure: AXILLARY SENTINEL NODE BIOPSY;  Surgeon: Herbert Pun, MD;  Location: [facility];  Service: General;  Laterality: Bilateral;  . BREAST BIOPSY Right 12/08/2018   [place] bx of mass with calcs path pending  . BREAST BIOPSY Right 12/08/2018   [place] bx of LN, path pending  . BREAST RECONSTRUCTION WITH PLACEMENT OF TISSUE EXPANDER AND ALLODERM Bilateral 01/14/2019   Procedure: BILATERAL BREAST RECONSTRUCTION WITH PLACEMENT OF TISSUE EXPANDER AND FLEX HD;   Surgeon: Wallace Going, DO;  Location: [facility];  Service: Plastics;  Laterality: Bilateral;  . COLONOSCOPY WITH PROPOFOL N/A 02/17/2015   Procedure: COLONOSCOPY WITH PROPOFOL;  Surgeon: Lucilla Lame, MD;  Location: [place];  Service: Endoscopy;  Laterality: N/A;  . COLONOSCOPY WITH PROPOFOL N/A 07/12/2019   Procedure: COLONOSCOPY WITH BIOPSY;  Surgeon: Lucilla Lame, MD;  Location: [place];  Service: Endoscopy;  Laterality: N/A;  priority 3 PORT A CATH NEEDS ACCESSED  . CYSTOSCOPY N/A 08/05/2019   Procedure: CYSTOSCOPY;  Surgeon: Homero Fellers, MD;  Location: [facility];  Service: Gynecology;  Laterality: N/A;  . POLYPECTOMY  02/17/2015   Procedure: POLYPECTOMY;  Surgeon: Lucilla Lame, MD;  Location: [place];  Service: Endoscopy;;  . POLYPECTOMY N/A 07/12/2019   Procedure: POLYPECTOMY;  Surgeon: Lucilla Lame, MD;  Location: [place];  Service: Endoscopy;  Laterality: N/A;  . PORTA CATH REMOVAL Left 10/13/2019   Procedure: PORTA CATH REMOVAL;  Surgeon: Wallace Going, DO;  Location: [place];  Service: Plastics;  Laterality: Left;  . PORTACATH PLACEMENT Left 02/18/2019   Procedure: INSERTION PORT-A-CATH;  Surgeon: Herbert Pun, MD;  Location: [facility];  Service: General;  Laterality: Left;  . REMOVAL OF BILATERAL TISSUE EXPANDERS WITH PLACEMENT OF BILATERAL BREAST IMPLANTS Bilateral 10/13/2019   Procedure: REMOVAL OF BILATERAL TISSUE EXPANDERS WITH PLACEMENT OF BILATERAL BREAST IMPLANTS;  Surgeon: Wallace Going, DO;  Location: [place];  Service: Plastics;  Laterality: Bilateral;  2 hours please  . SIMPLE MASTECTOMY WITH AXILLARY SENTINEL NODE BIOPSY Left 01/14/2019   Procedure: LEFT SIMPLE MASTECTOMY;  Surgeon: Herbert Pun, MD;  Location: [facility];  Service: General;  Laterality: Left;  . TOTAL LAPAROSCOPIC HYSTERECTOMY WITH BILATERAL SALPINGO OOPHORECTOMY Bilateral 08/05/2019    Procedure: TOTAL LAPAROSCOPIC HYSTERECTOMY WITH BILATERAL SALPINGO OOPHORECTOMY;  Surgeon: Homero Fellers, MD;  Location: [facility];  Service: Gynecology;  Laterality: Bilateral;  . TOTAL MASTECTOMY Right 01/14/2019   Procedure: RIGHT TOTAL MASTECTOMY;  Surgeon: Herbert Pun, MD;  Location: [facility];  Service: General;  Laterality: Right;  . WISDOM TOOTH EXTRACTION      Social History: Social History   Socioeconomic History  . Marital status: Married    Spouse name: Jenny Reichmann  . Number of children: Not on file  . Years of education: Not on file  . Highest education level: Not on file  Occupational History  . Occupation: Physicist, medical    Comment: retired  Tobacco Use  . Smoking status: Current Some Day Smoker    Packs/day: 0.05    Years: 20.00    Pack years: 1.00    Types: Cigarettes    Last attempt to quit: 01/07/2019    Years since quitting: 1.1  . Smokeless tobacco: Never Used  . Tobacco comment: 4 cigs a day.   Vaping Use  . Vaping Use: Never used  Substance and Sexual Activity  . Alcohol  use: Yes    Alcohol/week: 2.0 standard drinks    Types: 2 Standard drinks or equivalent per week    Comment: occasional  . Drug use: No  . Sexual activity: Yes    Birth control/protection: Surgical  Other Topics Concern  . Not on file  Social History Narrative   Patient lives with husband.   Social Determinants of Health   Financial Resource Strain:   . Difficulty of Paying Living Expenses: Not on file  Food Insecurity:   . Worried About Charity fundraiser in the Last Year: Not on file  . Ran Out of Food in the Last Year: Not on file  Transportation Needs:   . Lack of Transportation (Medical): Not on file  . Lack of Transportation (Non-Medical): Not on file  Physical Activity:   . Days of Exercise per Week: Not on file  . Minutes of Exercise per Session: Not on file  Stress:   . Feeling of Stress : Not on file  Social Connections:   . Frequency of Communication  with Friends and Family: Not on file  . Frequency of Social Gatherings with Friends and Family: Not on file  . Attends Religious Services: Not on file  . Active Member of Clubs or Organizations: Not on file  . Attends Archivist Meetings: Not on file  . Marital Status: Not on file  Intimate Partner Violence:   . Fear of Current or Ex-Partner: Not on file  . Emotionally Abused: Not on file  . Physically Abused: Not on file  . Sexually Abused: Not on file    Family History: Family History  Problem Relation Age of Onset  . Colon cancer Mother 44  . Breast cancer Mother 38       "BRCA2+"  . Hypertension Father   . Stroke Father   . Alzheimer's disease Father   . Prostate cancer Father   . Breast cancer Maternal Aunt        mat great aunt  . Breast cancer Maternal Grandmother 72  . Pancreatic cancer Maternal Grandfather 60    Review of Systems: Review of Systems  Constitutional: Negative.   Respiratory: Negative.   Cardiovascular: Negative.   Gastrointestinal: Negative.   Neurological: Negative.     Physical Exam: Vital Signs BP 135/88 (BP Location: Left Arm, Patient Position: Sitting, Cuff Size: Normal)   Pulse 73   Temp 98.6 F (37 C) (Oral)   Ht _0  (1.626 m)   Wt 156 lb 3.2 oz (70.9 kg)   SpO2 98%   BMI 26.81 kg/m  Physical Exam Exam conducted with a chaperone present.  Constitutional:      General: She is not in acute distress.    Appearance: Normal appearance. She is not ill-appearing.  HENT:     Head: Normocephalic and atraumatic.  Eyes:     Pupils: Pupils are equal, round Neck:     Musculoskeletal: Normal range of motion.  Cardiovascular:     Rate and Rhythm: Normal rate and regular rhythm.     Pulses: Normal pulses.     Heart sounds: Normal heart sounds. No murmur.  Pulmonary:     Effort: Pulmonary effort is normal. No respiratory distress.     Breath sounds: Normal breath sounds. No wheezing.  Abdominal:     General: Abdomen is  flat. There is no distension.     Palpations: Abdomen is soft.     Tenderness: There is no abdominal tenderness.  Musculoskeletal: Normal  range of motion.  Skin:    General: Skin is warm and dry.     Findings: No erythema or rash.  Neurological:     General: No focal deficit present.     Mental Status: She is alert and oriented to person, place, and time. Mental status is at baseline.     Motor: No weakness.  Psychiatric:        Mood and Affect: Mood normal.        Behavior: Behavior normal.    Assessment/Plan: The patient is scheduled for liposuction of abdomen for Lipo filling of bilateral breasts with Dr. Marla Roe.  Risks, benefits, and alternatives of procedure discussed, questions answered and consent obtained.    Smoking Status: Occasional smoker, last cigarette was 1 day ago; Counseling Given?  Yes, we discussed the risks associated with smoking and poor take of the fat grafting.  Patient is in agreement to avoid smoking prior to and after surgery at this time.  Last Mammogram: Bilateral mastectomies; Results: N/A  Caprini Score: 6, high; Risk Factors include: age, BMI greater than 25, history of cancer and length of planned surgery. Recommendation for mechanical and pharmacological prophylaxis. Encourage early ambulation.   Pictures obtained: 02/22/2020  Post-op Rx sent to pharmacy: Norco, Zofran, Keflex  Patient was provided with the General Surgical Risk consent document and Pain Medication Agreement prior to their appointment.  They had adequate time to read through the risk consent documents and Pain Medication Agreement. We also discussed them in person together during this preop appointment. All of their questions were answered to their satisfaction.  Recommended calling if they have any further questions.  Risk consent form and Pain Medication Agreement to be scanned into patient's chart.  The risks that can be encountered with and after liposuction were discussed and  include the following but no limited to these:  Asymmetry, fluid accumulation, firmness of the area, fat necrosis with death of fat tissue, bleeding, infection, delayed healing, anesthesia risks, skin sensation changes, injury to structures including nerves, blood vessels, and muscles which may be temporary or permanent, allergies to tape, suture materials and glues, blood products, topical preparations or injected agents, skin and contour irregularities, skin discoloration and swelling, deep vein thrombosis, cardiac and pulmonary complications, pain, which may persist, persistent pain, recurrence of the lesion, poor healing of the incision, possible need for revisional surgery or staged procedures. Thiere can also be persistent swelling, poor wound healing, rippling or loose skin, worsening of cellulite, swelling, and thermal burn or heat injury from ultrasound with the ultrasound-assisted lipoplasty technique. Any change in weight fluctuations can alter the outcome.   Electronically signed by: Carola Rhine Emonie Espericueta, PA-C 03/10/2020 10:44 AM

## 2020-03-09 NOTE — H&P (View-Only) (Signed)
Patient ID: Cheryl Mooney, female    DOB: Mar 13, 1966, 54 y.o.   MRN: [id number]  Chief Complaint  Patient presents with  . Pre-op Exam      ICD-10-CM   1. S/P breast reconstruction, bilateral  Z98.890   2. Acquired absence of bilateral breasts and nipples  Z90.13     History of Present Illness: Cheryl Mooney is a 54 y.o.  female  with a history of bilateral mastectomies resulting in bilateral breast reconstruction.  She presents for preoperative evaluation for upcoming procedure, liposuction of abdomen for Lipo filling of bilateral breasts, scheduled for 03/16/2020 with Dr. Marla Roe  The patient has not had problems with anesthesia. No history of DVT/PE.  No family history of DVT/PE.  No family or personal history of bleeding or clotting disorders.  Patient is not currently taking any blood thinners.  No history of CVA/MI.   Patient reports that she occasionally smokes cigarettes, last cigarette was yesterday due to receiving some news about a diagnosis of osteoporosis.  She reports that she is going to avoid smoking until surgery and after surgery.  Summary of Previous Visit: Patient had bilateral mastectomies with expanders placed and then expanders exchanged to implants in July 2021.  PMH Significant for: Hypertension, GERD   Past Medical History: Allergies: No Known Allergies  Current Medications:  Current Outpatient Medications:  .  acetaminophen (TYLENOL) 500 MG tablet, Take 2 tablets (1,000 mg total) by mouth every 6 (six) hours as needed for mild pain., Disp: 30 tablet, Rfl: 0 .  anastrozole (ARIMIDEX) 1 MG tablet, Take 1 tablet (1 mg total) by mouth daily., Disp: 90 tablet, Rfl: 0 .  cetirizine (ZYRTEC) 10 MG tablet, Take 10 mg by mouth daily., Disp: , Rfl:  .  DULoxetine (CYMBALTA) 60 MG capsule, Take 1 capsule (60 mg total) by mouth daily., Disp: 90 capsule, Rfl: 0 .  lisinopril-hydrochlorothiazide (ZESTORETIC) 20-25 MG tablet, Take 1 tablet by mouth daily.,  Disp: 90 tablet, Rfl: 1 .  multivitamin-iron-minerals-folic acid (CENTRUM) chewable tablet, Chew 1 tablet by mouth daily., Disp: , Rfl:  .  omeprazole (PRILOSEC) 40 MG capsule, Take 1 capsule by mouth once daily, Disp: 90 capsule, Rfl: 3 .  cephALEXin (KEFLEX) 500 MG capsule, Take 1 capsule (500 mg total) by mouth 4 (four) times daily for 5 days., Disp: 20 capsule, Rfl: 0 .  HYDROcodone-acetaminophen (NORCO) 5-325 MG tablet, Take 1 tablet by mouth every 6 (six) hours as needed for up to 5 days for severe pain., Disp: 20 tablet, Rfl: 0 .  ondansetron (ZOFRAN) 4 MG tablet, Take 1 tablet (4 mg total) by mouth every 8 (eight) hours as needed for nausea or vomiting., Disp: 20 tablet, Rfl: 0  Past Medical Problems: Past Medical History:  Diagnosis Date  . Anemia    things seem back to normal  . Benign neoplasm of sigmoid colon   . Benign neoplasm of transverse colon   . Breast cancer ([place])   . Depression   . Family history of BRCA2 gene positive   . Family history of breast cancer   . Family history of colon cancer   . Family history of lung cancer   . Family history of pancreatic cancer   . GERD (gastroesophageal reflux disease)   . History of kidney stones   . Hypertension   . Malignant neoplasm of lower-outer quadrant of right breast of female, estrogen receptor positive ([place]) 12/17/2018  . Smoker     Past Surgical  History: Past Surgical History:  Procedure Laterality Date  . AXILLARY SENTINEL NODE BIOPSY Bilateral 01/14/2019   Procedure: AXILLARY SENTINEL NODE BIOPSY;  Surgeon: Herbert Pun, MD;  Location: [facility];  Service: General;  Laterality: Bilateral;  . BREAST BIOPSY Right 12/08/2018   [place] bx of mass with calcs path pending  . BREAST BIOPSY Right 12/08/2018   [place] bx of LN, path pending  . BREAST RECONSTRUCTION WITH PLACEMENT OF TISSUE EXPANDER AND ALLODERM Bilateral 01/14/2019   Procedure: BILATERAL BREAST RECONSTRUCTION WITH PLACEMENT OF TISSUE EXPANDER AND FLEX HD;   Surgeon: Wallace Going, DO;  Location: [facility];  Service: Plastics;  Laterality: Bilateral;  . COLONOSCOPY WITH PROPOFOL N/A 02/17/2015   Procedure: COLONOSCOPY WITH PROPOFOL;  Surgeon: Lucilla Lame, MD;  Location: [place];  Service: Endoscopy;  Laterality: N/A;  . COLONOSCOPY WITH PROPOFOL N/A 07/12/2019   Procedure: COLONOSCOPY WITH BIOPSY;  Surgeon: Lucilla Lame, MD;  Location: [place];  Service: Endoscopy;  Laterality: N/A;  priority 3 PORT A CATH NEEDS ACCESSED  . CYSTOSCOPY N/A 08/05/2019   Procedure: CYSTOSCOPY;  Surgeon: Homero Fellers, MD;  Location: [facility];  Service: Gynecology;  Laterality: N/A;  . POLYPECTOMY  02/17/2015   Procedure: POLYPECTOMY;  Surgeon: Lucilla Lame, MD;  Location: [place];  Service: Endoscopy;;  . POLYPECTOMY N/A 07/12/2019   Procedure: POLYPECTOMY;  Surgeon: Lucilla Lame, MD;  Location: [place];  Service: Endoscopy;  Laterality: N/A;  . PORTA CATH REMOVAL Left 10/13/2019   Procedure: PORTA CATH REMOVAL;  Surgeon: Wallace Going, DO;  Location: [place];  Service: Plastics;  Laterality: Left;  . PORTACATH PLACEMENT Left 02/18/2019   Procedure: INSERTION PORT-A-CATH;  Surgeon: Herbert Pun, MD;  Location: [facility];  Service: General;  Laterality: Left;  . REMOVAL OF BILATERAL TISSUE EXPANDERS WITH PLACEMENT OF BILATERAL BREAST IMPLANTS Bilateral 10/13/2019   Procedure: REMOVAL OF BILATERAL TISSUE EXPANDERS WITH PLACEMENT OF BILATERAL BREAST IMPLANTS;  Surgeon: Wallace Going, DO;  Location: [place];  Service: Plastics;  Laterality: Bilateral;  2 hours please  . SIMPLE MASTECTOMY WITH AXILLARY SENTINEL NODE BIOPSY Left 01/14/2019   Procedure: LEFT SIMPLE MASTECTOMY;  Surgeon: Herbert Pun, MD;  Location: [facility];  Service: General;  Laterality: Left;  . TOTAL LAPAROSCOPIC HYSTERECTOMY WITH BILATERAL SALPINGO OOPHORECTOMY Bilateral 08/05/2019    Procedure: TOTAL LAPAROSCOPIC HYSTERECTOMY WITH BILATERAL SALPINGO OOPHORECTOMY;  Surgeon: Homero Fellers, MD;  Location: [facility];  Service: Gynecology;  Laterality: Bilateral;  . TOTAL MASTECTOMY Right 01/14/2019   Procedure: RIGHT TOTAL MASTECTOMY;  Surgeon: Herbert Pun, MD;  Location: [facility];  Service: General;  Laterality: Right;  . WISDOM TOOTH EXTRACTION      Social History: Social History   Socioeconomic History  . Marital status: Married    Spouse name: Jenny Reichmann  . Number of children: Not on file  . Years of education: Not on file  . Highest education level: Not on file  Occupational History  . Occupation: Physicist, medical    Comment: retired  Tobacco Use  . Smoking status: Current Some Day Smoker    Packs/day: 0.05    Years: 20.00    Pack years: 1.00    Types: Cigarettes    Last attempt to quit: 01/07/2019    Years since quitting: 1.1  . Smokeless tobacco: Never Used  . Tobacco comment: 4 cigs a day.   Vaping Use  . Vaping Use: Never used  Substance and Sexual Activity  . Alcohol  use: Yes    Alcohol/week: 2.0 standard drinks    Types: 2 Standard drinks or equivalent per week    Comment: occasional  . Drug use: No  . Sexual activity: Yes    Birth control/protection: Surgical  Other Topics Concern  . Not on file  Social History Narrative   Patient lives with husband.   Social Determinants of Health   Financial Resource Strain:   . Difficulty of Paying Living Expenses: Not on file  Food Insecurity:   . Worried About Running Out of Food in the Last Year: Not on file  . Ran Out of Food in the Last Year: Not on file  Transportation Needs:   . Lack of Transportation (Medical): Not on file  . Lack of Transportation (Non-Medical): Not on file  Physical Activity:   . Days of Exercise per Week: Not on file  . Minutes of Exercise per Session: Not on file  Stress:   . Feeling of Stress : Not on file  Social Connections:   . Frequency of Communication  with Friends and Family: Not on file  . Frequency of Social Gatherings with Friends and Family: Not on file  . Attends Religious Services: Not on file  . Active Member of Clubs or Organizations: Not on file  . Attends Club or Organization Meetings: Not on file  . Marital Status: Not on file  Intimate Partner Violence:   . Fear of Current or Ex-Partner: Not on file  . Emotionally Abused: Not on file  . Physically Abused: Not on file  . Sexually Abused: Not on file    Family History: Family History  Problem Relation Age of Onset  . Colon cancer Mother 68  . Breast cancer Mother 53       "BRCA2+"  . Hypertension Father   . Stroke Father   . Alzheimer's disease Father   . Prostate cancer Father   . Breast cancer Maternal Aunt        mat great aunt  . Breast cancer Maternal Grandmother 58  . Pancreatic cancer Maternal Grandfather 60    Review of Systems: Review of Systems  Constitutional: Negative.   Respiratory: Negative.   Cardiovascular: Negative.   Gastrointestinal: Negative.   Neurological: Negative.     Physical Exam: Vital Signs BP 135/88 (BP Location: Left Arm, Patient Position: Sitting, Cuff Size: Normal)   Pulse 73   Temp 98.6 F (37 C) (Oral)   Ht 5' 4" (1.626 m)   Wt 156 lb 3.2 oz (70.9 kg)   SpO2 98%   BMI 26.81 kg/m  Physical Exam Exam conducted with a chaperone present.  Constitutional:      General: She is not in acute distress.    Appearance: Normal appearance. She is not ill-appearing.  HENT:     Head: Normocephalic and atraumatic.  Eyes:     Pupils: Pupils are equal, round Neck:     Musculoskeletal: Normal range of motion.  Cardiovascular:     Rate and Rhythm: Normal rate and regular rhythm.     Pulses: Normal pulses.     Heart sounds: Normal heart sounds. No murmur.  Pulmonary:     Effort: Pulmonary effort is normal. No respiratory distress.     Breath sounds: Normal breath sounds. No wheezing.  Abdominal:     General: Abdomen is  flat. There is no distension.     Palpations: Abdomen is soft.     Tenderness: There is no abdominal tenderness.  Musculoskeletal: Normal   range of motion.  Skin:    General: Skin is warm and dry.     Findings: No erythema or rash.  Neurological:     General: No focal deficit present.     Mental Status: She is alert and oriented to person, place, and time. Mental status is at baseline.     Motor: No weakness.  Psychiatric:        Mood and Affect: Mood normal.        Behavior: Behavior normal.    Assessment/Plan: The patient is scheduled for liposuction of abdomen for Lipo filling of bilateral breasts with Dr. Marla Roe.  Risks, benefits, and alternatives of procedure discussed, questions answered and consent obtained.    Smoking Status: Occasional smoker, last cigarette was 1 day ago; Counseling Given?  Yes, we discussed the risks associated with smoking and poor take of the fat grafting.  Patient is in agreement to avoid smoking prior to and after surgery at this time.  Last Mammogram: Bilateral mastectomies; Results: N/A  Caprini Score: 6, high; Risk Factors include: age, BMI greater than 25, history of cancer and length of planned surgery. Recommendation for mechanical and pharmacological prophylaxis. Encourage early ambulation.   Pictures obtained: 02/22/2020  Post-op Rx sent to pharmacy: Norco, Zofran, Keflex  Patient was provided with the General Surgical Risk consent document and Pain Medication Agreement prior to their appointment.  They had adequate time to read through the risk consent documents and Pain Medication Agreement. We also discussed them in person together during this preop appointment. All of their questions were answered to their satisfaction.  Recommended calling if they have any further questions.  Risk consent form and Pain Medication Agreement to be scanned into patient's chart.  The risks that can be encountered with and after liposuction were discussed and  include the following but no limited to these:  Asymmetry, fluid accumulation, firmness of the area, fat necrosis with death of fat tissue, bleeding, infection, delayed healing, anesthesia risks, skin sensation changes, injury to structures including nerves, blood vessels, and muscles which may be temporary or permanent, allergies to tape, suture materials and glues, blood products, topical preparations or injected agents, skin and contour irregularities, skin discoloration and swelling, deep vein thrombosis, cardiac and pulmonary complications, pain, which may persist, persistent pain, recurrence of the lesion, poor healing of the incision, possible need for revisional surgery or staged procedures. Thiere can also be persistent swelling, poor wound healing, rippling or loose skin, worsening of cellulite, swelling, and thermal burn or heat injury from ultrasound with the ultrasound-assisted lipoplasty technique. Any change in weight fluctuations can alter the outcome.   Electronically signed by: Carola Rhine Veryl Abril, PA-C 03/10/2020 10:44 AM

## 2020-03-10 ENCOUNTER — Ambulatory Visit (INDEPENDENT_AMBULATORY_CARE_PROVIDER_SITE_OTHER): Payer: BC Managed Care – PPO | Admitting: Surgical

## 2020-03-10 ENCOUNTER — Other Ambulatory Visit: Payer: Self-pay

## 2020-03-10 ENCOUNTER — Encounter: Payer: Self-pay | Admitting: Surgical

## 2020-03-10 ENCOUNTER — Encounter (HOSPITAL_BASED_OUTPATIENT_CLINIC_OR_DEPARTMENT_OTHER): Payer: Self-pay | Admitting: Plastic Surgery

## 2020-03-10 VITALS — BP 135/88 | HR 73 | Temp 98.6°F | Ht 64.0 in | Wt 156.2 lb

## 2020-03-10 DIAGNOSIS — Z9889 Other specified postprocedural states: Secondary | ICD-10-CM

## 2020-03-10 DIAGNOSIS — Z9013 Acquired absence of bilateral breasts and nipples: Secondary | ICD-10-CM

## 2020-03-10 MED ORDER — HYDROCODONE-ACETAMINOPHEN 5-325 MG PO TABS: 1.0000 | ORAL_TABLET | Freq: Four times a day (QID) | ORAL | 0 refills | Status: AC | PRN

## 2020-03-10 MED ORDER — CEPHALEXIN 500 MG PO CAPS: 500.0000 mg | ORAL_CAPSULE | Freq: Four times a day (QID) | ORAL | 0 refills | Status: AC

## 2020-03-10 MED ORDER — ONDANSETRON HCL 4 MG PO TABS: 4.0000 mg | ORAL_TABLET | Freq: Three times a day (TID) | ORAL | 0 refills | Status: DC | PRN

## 2020-03-13 ENCOUNTER — Inpatient Hospital Stay: Payer: BC Managed Care – PPO

## 2020-03-13 ENCOUNTER — Encounter (HOSPITAL_BASED_OUTPATIENT_CLINIC_OR_DEPARTMENT_OTHER)
Admission: RE | Admit: 2020-03-13 | Discharge: 2020-03-13 | Disposition: A | Discharge status: Home / Self Care | Payer: BC Managed Care – PPO | Source: Ambulatory Visit | Attending: Plastic Surgery | Admitting: Plastic Surgery

## 2020-03-13 DIAGNOSIS — Z01812 Encounter for preprocedural laboratory examination: Secondary | ICD-10-CM | POA: Insufficient documentation

## 2020-03-13 DIAGNOSIS — Z9013 Acquired absence of bilateral breasts and nipples: Secondary | ICD-10-CM | POA: Diagnosis not present

## 2020-03-13 DIAGNOSIS — Z853 Personal history of malignant neoplasm of breast: Secondary | ICD-10-CM | POA: Diagnosis not present

## 2020-03-13 DIAGNOSIS — Z79899 Other long term (current) drug therapy: Secondary | ICD-10-CM | POA: Diagnosis not present

## 2020-03-13 DIAGNOSIS — Z9882 Breast implant status: Secondary | ICD-10-CM | POA: Diagnosis not present

## 2020-03-13 DIAGNOSIS — N6489 Other specified disorders of breast: Secondary | ICD-10-CM | POA: Diagnosis present

## 2020-03-13 LAB — BASIC METABOLIC PANEL
Anion gap: 14 (ref 5–15)
BUN: 9 mg/dL (ref 6–20)
CO2: 20 mmol/L — ABNORMAL LOW (ref 22–32)
Calcium: 9.3 mg/dL (ref 8.9–10.3)
Chloride: 98 mmol/L (ref 98–111)
Creatinine, Ser: 0.75 mg/dL (ref 0.44–1.00)
GFR, Estimated: >60 mL/min (ref >60)
Glucose, Bld: 219 mg/dL — ABNORMAL HIGH (ref 70–99)
Potassium: 3.8 mmol/L (ref 3.5–5.1)
Sodium: 132 mmol/L — ABNORMAL LOW (ref 135–145)

## 2020-03-14 ENCOUNTER — Other Ambulatory Visit
Admission: RE | Admit: 2020-03-14 | Discharge: 2020-03-14 | Disposition: A | Discharge status: Home / Self Care | Payer: BC Managed Care – PPO | Source: Ambulatory Visit | Attending: Plastic Surgery | Admitting: Plastic Surgery

## 2020-03-14 ENCOUNTER — Other Ambulatory Visit: Payer: Self-pay

## 2020-03-14 DIAGNOSIS — Z01812 Encounter for preprocedural laboratory examination: Secondary | ICD-10-CM | POA: Diagnosis not present

## 2020-03-14 DIAGNOSIS — Z20822 Contact with and (suspected) exposure to covid-19: Secondary | ICD-10-CM | POA: Diagnosis not present

## 2020-03-14 LAB — SARS CORONAVIRUS 2 (TAT 6-24 HRS): SARS Coronavirus 2: NEGATIVE

## 2020-03-16 ENCOUNTER — Ambulatory Visit (HOSPITAL_BASED_OUTPATIENT_CLINIC_OR_DEPARTMENT_OTHER): Payer: BC Managed Care – PPO | Admitting: Anesthesiology

## 2020-03-16 ENCOUNTER — Other Ambulatory Visit: Payer: Self-pay

## 2020-03-16 ENCOUNTER — Encounter (HOSPITAL_BASED_OUTPATIENT_CLINIC_OR_DEPARTMENT_OTHER): Payer: Self-pay | Admitting: Plastic Surgery

## 2020-03-16 ENCOUNTER — Encounter (HOSPITAL_BASED_OUTPATIENT_CLINIC_OR_DEPARTMENT_OTHER)
Admission: RE | Disposition: A | Discharge status: Home / Self Care | Payer: Self-pay | Source: Ambulatory Visit | Attending: Plastic Surgery

## 2020-03-16 ENCOUNTER — Ambulatory Visit (HOSPITAL_BASED_OUTPATIENT_CLINIC_OR_DEPARTMENT_OTHER)
Admission: RE | Admit: 2020-03-16 | Discharge: 2020-03-16 | Disposition: A | Discharge status: Home / Self Care | Payer: BC Managed Care – PPO | Source: Ambulatory Visit | Attending: Plastic Surgery | Admitting: Plastic Surgery

## 2020-03-16 DIAGNOSIS — Z853 Personal history of malignant neoplasm of breast: Secondary | ICD-10-CM | POA: Insufficient documentation

## 2020-03-16 DIAGNOSIS — Z421 Encounter for breast reconstruction following mastectomy: Secondary | ICD-10-CM | POA: Diagnosis not present

## 2020-03-16 DIAGNOSIS — Z79899 Other long term (current) drug therapy: Secondary | ICD-10-CM | POA: Insufficient documentation

## 2020-03-16 DIAGNOSIS — N6489 Other specified disorders of breast: Secondary | ICD-10-CM | POA: Diagnosis not present

## 2020-03-16 DIAGNOSIS — Z9882 Breast implant status: Secondary | ICD-10-CM | POA: Insufficient documentation

## 2020-03-16 DIAGNOSIS — N641 Fat necrosis of breast: Secondary | ICD-10-CM | POA: Diagnosis not present

## 2020-03-16 DIAGNOSIS — Z9013 Acquired absence of bilateral breasts and nipples: Secondary | ICD-10-CM | POA: Insufficient documentation

## 2020-03-16 HISTORY — PX: LIPOSUCTION WITH LIPOFILLING: SHX6436

## 2020-03-16 SURGERY — LIPOSUCTION, WITH FAT TRANSFER
Anesthesia: General | Site: Breast | Laterality: Bilateral

## 2020-03-16 MED ORDER — DEXAMETHASONE SODIUM PHOSPHATE 4 MG/ML IJ SOLN
INTRAMUSCULAR | Status: DC | PRN
  Administered 2020-03-16: 10 mg via INTRAVENOUS

## 2020-03-16 MED ORDER — CEFAZOLIN SODIUM-DEXTROSE 2-4 GM/100ML-% IV SOLN
INTRAVENOUS | Status: AC
  Filled 2020-03-16: qty 100

## 2020-03-16 MED ORDER — AMISULPRIDE (ANTIEMETIC) 5 MG/2ML IV SOLN: 10.0000 mg | Freq: Once | INTRAVENOUS | Status: DC | PRN

## 2020-03-16 MED ORDER — LIDOCAINE HCL (PF) 1 % IJ SOLN
INTRAMUSCULAR | Status: AC
  Filled 2020-03-16: qty 60

## 2020-03-16 MED ORDER — FENTANYL CITRATE (PF) 100 MCG/2ML IJ SOLN
INTRAMUSCULAR | Status: AC
  Filled 2020-03-16: qty 2

## 2020-03-16 MED ORDER — ACETAMINOPHEN 325 MG RE SUPP: 650.0000 mg | RECTAL | Status: DC | PRN

## 2020-03-16 MED ORDER — OXYCODONE HCL 5 MG PO TABS
5.0000 mg | ORAL_TABLET | Freq: Once | ORAL | Status: AC | PRN
  Administered 2020-03-16: 5 mg via ORAL

## 2020-03-16 MED ORDER — FENTANYL CITRATE (PF) 100 MCG/2ML IJ SOLN: 25.0000 ug | INTRAMUSCULAR | Status: DC | PRN

## 2020-03-16 MED ORDER — LIDOCAINE-EPINEPHRINE 1 %-1:100000 IJ SOLN
INTRAMUSCULAR | Status: DC | PRN
  Administered 2020-03-16: 5 mL

## 2020-03-16 MED ORDER — ONDANSETRON HCL 4 MG/2ML IJ SOLN: 4.0000 mg | Freq: Once | INTRAMUSCULAR | Status: DC | PRN

## 2020-03-16 MED ORDER — LIDOCAINE-EPINEPHRINE 1 %-1:100000 IJ SOLN
INTRAMUSCULAR | Status: AC
  Filled 2020-03-16: qty 1

## 2020-03-16 MED ORDER — SODIUM CHLORIDE 0.9 % IV SOLN: 250.0000 mL | INTRAVENOUS | Status: DC | PRN

## 2020-03-16 MED ORDER — OXYCODONE HCL 5 MG PO TABS: 5.0000 mg | ORAL_TABLET | ORAL | Status: DC | PRN

## 2020-03-16 MED ORDER — CEFAZOLIN SODIUM-DEXTROSE 2-4 GM/100ML-% IV SOLN
2.0000 g | INTRAVENOUS | Status: AC
  Administered 2020-03-16: 2 g via INTRAVENOUS

## 2020-03-16 MED ORDER — PROPOFOL 10 MG/ML IV BOLUS
INTRAVENOUS | Status: DC | PRN
  Administered 2020-03-16: 50 mg via INTRAVENOUS
  Administered 2020-03-16: 120 mg via INTRAVENOUS

## 2020-03-16 MED ORDER — LIDOCAINE 2% (20 MG/ML) 5 ML SYRINGE
INTRAMUSCULAR | Status: AC
  Filled 2020-03-16: qty 5

## 2020-03-16 MED ORDER — LACTATED RINGERS IV SOLN: INTRAVENOUS | Status: DC

## 2020-03-16 MED ORDER — MIDAZOLAM HCL 2 MG/2ML IJ SOLN
INTRAMUSCULAR | Status: AC
  Filled 2020-03-16: qty 2

## 2020-03-16 MED ORDER — MIDAZOLAM HCL 5 MG/5ML IJ SOLN
INTRAMUSCULAR | Status: DC | PRN
  Administered 2020-03-16: 2 mg via INTRAVENOUS

## 2020-03-16 MED ORDER — ACETAMINOPHEN 325 MG PO TABS: 650.0000 mg | ORAL_TABLET | ORAL | Status: DC | PRN

## 2020-03-16 MED ORDER — CHLORHEXIDINE GLUCONATE CLOTH 2 % EX PADS: 6.0000 | MEDICATED_PAD | Freq: Once | CUTANEOUS | Status: DC

## 2020-03-16 MED ORDER — SODIUM CHLORIDE 0.9% FLUSH: 3.0000 mL | Freq: Two times a day (BID) | INTRAVENOUS | Status: DC

## 2020-03-16 MED ORDER — OXYCODONE HCL 5 MG PO TABS
ORAL_TABLET | ORAL | Status: AC
  Filled 2020-03-16: qty 1

## 2020-03-16 MED ORDER — PROPOFOL 10 MG/ML IV BOLUS
INTRAVENOUS | Status: AC
  Filled 2020-03-16: qty 20

## 2020-03-16 MED ORDER — FENTANYL CITRATE (PF) 100 MCG/2ML IJ SOLN
INTRAMUSCULAR | Status: DC | PRN
  Administered 2020-03-16 (×2): 50 ug via INTRAVENOUS
  Administered 2020-03-16: 25 ug via INTRAVENOUS
  Administered 2020-03-16: 50 ug via INTRAVENOUS
  Administered 2020-03-16: 25 ug via INTRAVENOUS

## 2020-03-16 MED ORDER — SODIUM CHLORIDE 0.9% FLUSH: 3.0000 mL | INTRAVENOUS | Status: DC | PRN

## 2020-03-16 MED ORDER — BUPIVACAINE HCL (PF) 0.25 % IJ SOLN
INTRAMUSCULAR | Status: AC
  Filled 2020-03-16: qty 30

## 2020-03-16 MED ORDER — FENTANYL CITRATE (PF) 100 MCG/2ML IJ SOLN
25.0000 ug | INTRAMUSCULAR | Status: DC | PRN
  Administered 2020-03-16 (×3): 25 ug via INTRAVENOUS

## 2020-03-16 MED ORDER — EPINEPHRINE PF 1 MG/ML IJ SOLN
INTRAMUSCULAR | Status: AC
  Filled 2020-03-16: qty 1

## 2020-03-16 MED ORDER — LIDOCAINE 2% (20 MG/ML) 5 ML SYRINGE
INTRAMUSCULAR | Status: DC | PRN
  Administered 2020-03-16: 60 mg via INTRAVENOUS

## 2020-03-16 MED ORDER — LIDOCAINE HCL 1 % IJ SOLN
INTRAVENOUS | Status: DC | PRN
  Administered 2020-03-16: 1000 mL

## 2020-03-16 MED ORDER — OXYCODONE HCL 5 MG/5ML PO SOLN: 5.0000 mg | Freq: Once | ORAL | Status: AC | PRN

## 2020-03-16 SURGICAL SUPPLY — 51 items
ADH SKN CLS APL DERMABOND .7 (GAUZE/BANDAGES/DRESSINGS) ×1
BAG DRN INLT TBG SET TISS ACC (MISCELLANEOUS) ×1
BINDER ABDOMINAL  9 SM 30-45 (SOFTGOODS) ×1
BINDER ABDOMINAL 10 UNV 27-48 (MISCELLANEOUS) IMPLANT
BINDER ABDOMINAL 12 SM 30-45 (SOFTGOODS) IMPLANT
BINDER ABDOMINAL 9 SM 30-45 (SOFTGOODS) ×1 IMPLANT
BINDER BREAST LRG (GAUZE/BANDAGES/DRESSINGS) ×2 IMPLANT
BINDER BREAST MEDIUM (GAUZE/BANDAGES/DRESSINGS) IMPLANT
BINDER BREAST XLRG (GAUZE/BANDAGES/DRESSINGS) IMPLANT
BINDER BREAST XXLRG (GAUZE/BANDAGES/DRESSINGS) IMPLANT
BLADE HEX COATED 2.75 (ELECTRODE) IMPLANT
BLADE SURG 15 STRL LF DISP TIS (BLADE) ×2 IMPLANT
BLADE SURG 15 STRL SS (BLADE) ×4
BNDG GAUZE ELAST 4 BULKY (GAUZE/BANDAGES/DRESSINGS) IMPLANT
COVER BACK TABLE 60X90IN (DRAPES) ×2 IMPLANT
COVER MAYO STAND STRL (DRAPES) ×4 IMPLANT
COVER WAND RF STERILE (DRAPES) IMPLANT
DECANTER SPIKE VIAL GLASS SM (MISCELLANEOUS) IMPLANT
DERMABOND ADVANCED (GAUZE/BANDAGES/DRESSINGS) ×1
DERMABOND ADVANCED .7 DNX12 (GAUZE/BANDAGES/DRESSINGS) ×1 IMPLANT
DRAPE LAPAROSCOPIC ABDOMINAL (DRAPES) ×2 IMPLANT
DRSG PAD ABDOMINAL 8X10 ST (GAUZE/BANDAGES/DRESSINGS) ×4 IMPLANT
ELECT REM PT RETURN 9FT ADLT (ELECTROSURGICAL) ×2
ELECTRODE REM PT RTRN 9FT ADLT (ELECTROSURGICAL) ×1 IMPLANT
EXTRACTOR CANIST REVOLVE STRL (CANNISTER) IMPLANT
GLOVE BIO SURGEON STRL SZ 6.5 (GLOVE) ×4 IMPLANT
GOWN STRL REUS W/ TWL LRG LVL3 (GOWN DISPOSABLE) ×2 IMPLANT
GOWN STRL REUS W/TWL LRG LVL3 (GOWN DISPOSABLE) ×4
IV LACTATED RINGERS 1000ML (IV SOLUTION) ×4 IMPLANT
LINER CANISTER 1000CC FLEX (MISCELLANEOUS) ×2 IMPLANT
NDL SAFETY ECLIPSE 18X1.5 (NEEDLE) ×1 IMPLANT
NEEDLE HYPO 18GX1.5 SHARP (NEEDLE) ×2
NEEDLE HYPO 25X1 1.5 SAFETY (NEEDLE) IMPLANT
PACK BASIN DAY SURGERY FS (CUSTOM PROCEDURE TRAY) ×2 IMPLANT
PAD ALCOHOL SWAB (MISCELLANEOUS) ×2 IMPLANT
PENCIL SMOKE EVACUATOR (MISCELLANEOUS) IMPLANT
SLEEVE SCD COMPRESS KNEE MED (MISCELLANEOUS) ×2 IMPLANT
SPONGE LAP 18X18 RF (DISPOSABLE) ×2 IMPLANT
SUT MNCRL AB 4-0 PS2 18 (SUTURE) IMPLANT
SUT MON AB 5-0 PS2 18 (SUTURE) ×4 IMPLANT
SYR 10ML LL (SYRINGE) ×8 IMPLANT
SYR 3ML 18GX1 1/2 (SYRINGE) IMPLANT
SYR 50ML LL SCALE MARK (SYRINGE) ×4 IMPLANT
SYR CONTROL 10ML LL (SYRINGE) ×2 IMPLANT
SYR TOOMEY 50ML (SYRINGE) ×4 IMPLANT
SYSTEM FAT FILTRATION 250 (MISCELLANEOUS) ×2 IMPLANT
TOWEL GREEN STERILE FF (TOWEL DISPOSABLE) ×4 IMPLANT
TRAY DSU PREP LF (CUSTOM PROCEDURE TRAY) ×2 IMPLANT
TUBING INFILTRATION IT-10001 (TUBING) IMPLANT
TUBING SET GRADUATE ASPIR 12FT (MISCELLANEOUS) ×2 IMPLANT
UNDERPAD 30X36 HEAVY ABSORB (UNDERPADS AND DIAPERS) ×4 IMPLANT

## 2020-03-16 NOTE — Anesthesia Postprocedure Evaluation (Signed)
Anesthesia Post Note  Patient: Cheryl Mooney  Procedure(s) Performed: Bilateral Lipofilling of breasts for symmetry (Bilateral Breast)     Patient location during evaluation: PACU Anesthesia Type: General Level of consciousness: awake and alert Pain management: pain level controlled Vital Signs Assessment: post-procedure vital signs reviewed and stable Respiratory status: spontaneous breathing, nonlabored ventilation and respiratory function stable Cardiovascular status: blood pressure returned to baseline and stable Postop Assessment: no apparent nausea or vomiting Anesthetic complications: no   No complications documented.  Last Vitals:  Vitals:   03/16/20 1258 03/16/20 1321  BP: (!) 141/81 (!) 158/87  Pulse: 72 74  Resp: 13 18  Temp:  36.6 C  SpO2: 97% 97%    Last Pain:  Vitals:   03/16/20 1321  TempSrc: Oral  PainSc: [place]

## 2020-03-16 NOTE — Discharge Instructions (Addendum)
INSTRUCTIONS FOR AFTER SURGERY   You will likely have some questions about what to expect following your operation.  The following information will help you and your family understand what to expect when you are discharged from the hospital.  Following these guidelines will help ensure a smooth recovery and reduce risks of complications.  Postoperative instructions include information on: diet, wound care, medications and physical activity.  AFTER SURGERY Expect to go home after the procedure.  In some cases, you may need to spend one night in the hospital for observation.  DIET This surgery does not require a specific diet.  However, I have to mention that the healthier you eat the better your body can start healing. It is important to increasing your protein intake.  This means limiting the foods with added sugar.  Focus on fruits and vegetables and some meat. It is very important to drink water after your surgery.  If your urine is bright yellow, then it is concentrated, and you need to drink more water.  As a general rule after surgery, you should have 8 ounces of water every hour while awake.  If you find you are persistently nauseated or unable to take in liquids let us know.  NO TOBACCO USE or EXPOSURE.  This will slow your healing process and increase the risk of a wound.  WOUND CARE You can shower the day after surgery.  Use fragrance free soap.  Dial, Dove, Ivory and Cetaphil are usually mild on the skin.  If you have steri-strips / tape directly attached to your skin leave them in place. It is OK to get these wet.  No baths, pools or hot tubs for two weeks. We close your incision to leave the smallest and best-looking scar. No ointment or creams on your incisions until given the go ahead.  Especially not Neosporin (Too many skin reactions with this one).  A few weeks after surgery you can use Mederma and start massaging the scar. We ask you to wear your binder or sports bra for the first 6  weeks around the clock, including while sleeping. This provides added comfort and helps reduce the fluid accumulation at the surgery site.  ACTIVITY No heavy lifting until cleared by the doctor.  It is OK to walk and climb stairs. In fact, moving your legs is very important to decrease your risk of a blood clot.  It will also help keep you from getting deconditioned.  Every 1 to 2 hours get up and walk for 5 minutes. This will help with a quicker recovery back to normal.  Let pain be your guide so you don't do too much.  NO, you cannot do the spring cleaning and don't plan on taking care of anyone else.  This is your time for TLC.   WORK Everyone returns to work at different times. As a rough guide, most people take at least 1 - 2 weeks off prior to returning to work. If you need documentation for your job, bring the forms to your postoperative follow up visit.  DRIVING Arrange for someone to bring you home from the hospital.  You may be able to drive a few days after surgery but not while taking any narcotics or valium.  BOWEL MOVEMENTS Constipation can occur after anesthesia and while taking pain medication.  It is important to stay ahead for your comfort.  We recommend taking Milk of Magnesia (2 tablespoons; twice a day) while taking the pain pills.  SEROMA This   is fluid your body tried to put in the surgical site.  This is normal but if it creates excessive pain and swelling let us know.  It usually decreases in a few weeks.  MEDICATIONS and PAIN CONTROL At your preoperative visit for you history and physical you were given the following medications: 1. An antibiotic: Start this medication when you get home and take according to the instructions on the bottle. 2. Zofran 4 mg:  This is to treat nausea and vomiting.  You can take this every 6 hours as needed and only if needed. 3. Norco (hydrocodone/acetaminophen) 5/325 mg:  This is only to be used after you have taken the motrin or the  tylenol. Every 8 hours as needed. Over the counter Medication to take: 4. Ibuprofen (Motrin) 600 mg:  Take this every 6 hours.  If you have additional pain then take 500 mg of the tylenol.  Only take the Norco after you have tried these two. 5. Miralax or stool softener of choice: Take this according to the bottle if you take the Norco.  WHEN TO CALL Call your surgeon's office if any of the following occur: . Fever 101 degrees F or greater . Excessive bleeding or fluid from the incision site. . Pain that increases over time without aid from the medications . Redness, warmth, or pus draining from incision sites . Persistent nausea or inability to take in liquids . Severe misshapen area that underwent the operation.   Post Anesthesia Home Care Instructions  Activity: Get plenty of rest for the remainder of the day. A responsible individual must stay with you for 24 hours following the procedure.  For the next 24 hours, DO NOT: -Drive a car -Operate machinery -Drink alcoholic beverages -Take any medication unless instructed by your physician -Make any legal decisions or sign important papers.  Meals: Start with liquid foods such as gelatin or soup. Progress to regular foods as tolerated. Avoid greasy, spicy, heavy foods. If nausea and/or vomiting occur, drink only clear liquids until the nausea and/or vomiting subsides. Call your physician if vomiting continues.  Special Instructions/Symptoms: Your throat may feel dry or sore from the anesthesia or the breathing tube placed in your throat during surgery. If this causes discomfort, gargle with warm salt water. The discomfort should disappear within 24 hours.  If you had a scopolamine patch placed behind your ear for the management of post- operative nausea and/or vomiting:  1. The medication in the patch is effective for 72 hours, after which it should be removed.  Wrap patch in a tissue and discard in the trash. Wash hands thoroughly  with soap and water. 2. You may remove the patch earlier than 72 hours if you experience unpleasant side effects which may include dry mouth, dizziness or visual disturbances. 3. Avoid touching the patch. Wash your hands with soap and water after contact with the patch.     

## 2020-03-16 NOTE — Op Note (Signed)
DATE OF OPERATION: 03/16/2020  LOCATION: [facility]  PREOPERATIVE DIAGNOSIS: Breast asymmetry after breast cancer treatment  POSTOPERATIVE DIAGNOSIS: Same  PROCEDURE: Lipofilling of bilateral breasts for symmetry RIght: 90 cc Left:  90 cc  SURGEON: Aylah Yeary Sanger Reganne Messerschmidt, DO  ASSISTANT: Roetta Sessions, PA  EBL: 2 cc  CONDITION: Stable  COMPLICATIONS: None  INDICATION: The patient, Cheryl Mooney, is a 54 y.o. female born on Aug 30, 1965, is here for treatment of bilateral breast asymmetry after bilateral mastectomies for treatment of breast cancer.   PROCEDURE DETAILS:  The patient was seen prior to surgery and marked.  The IV antibiotics were given. The patient was taken to the operating room and given a general anesthetic. A standard time out was performed and all information was confirmed by those in the room. SCDs were placed.   The abdomen and breasts were prepped and draped.  The local was placed in the umbilical area and medial breast area at the incision.  The #15 blade was used to make a 3 mm incision in each place. The tumescent was infused into the abdomen.  After waiting for the local to take effect the liposuction device was used to obtain the adipose for transfer.  The pure graft device was used for collection.  The manufacture guidelines were used to prepare the tissue.  The fat was placed in 10 cc syringes and used to inject into the medial and superior aspect of each breast.  The left was injected with 90 cc and the right 90 cc. An additional 3 cc was placed under the port-a-cath site.  The incisions were closed with the 5-0 Monocryl.  Derma bond was used and sterile dressing applied with binders.  The patient was allowed to wake up and taken to recovery room in stable condition at the end of the case. The family was notified at the end of the case.   The advanced practice practitioner (APP) assisted throughout the case.  The APP was essential in  retraction and counter traction when needed to make the case progress smoothly.  This retraction and assistance made it possible to see the tissue plans for the procedure.  The assistance was needed for blood control, tissue re-approximation and assisted with closure of the incision site.

## 2020-03-16 NOTE — Transfer of Care (Signed)
Immediate Anesthesia Transfer of Care Note  Patient: Cheryl Mooney  Procedure(s) Performed: Bilateral Lipofilling of breasts for symmetry (Bilateral Breast)  Patient Location: PACU  Anesthesia Type:General  Level of Consciousness: sedated  Airway & Oxygen Therapy: Patient Spontanous Breathing and Patient connected to face mask oxygen  Post-op Assessment: Report given to RN and Post -op Vital signs reviewed and stable  Post vital signs: Reviewed and stable  Last Vitals:  Vitals Value Taken Time  BP 170/94 03/16/20 1200  Temp 36.4 C 03/16/20 1200  Pulse 70 03/16/20 1204  Resp 12 03/16/20 1204  SpO2 100 % 03/16/20 1204  Vitals shown include unvalidated device data.  Last Pain:  Vitals:   03/16/20 1200  TempSrc:   PainSc: 0-No pain         Complications: No complications documented.

## 2020-03-16 NOTE — Interval H&P Note (Signed)
History and Physical Interval Note:  03/16/2020 9:55 AM  Cheryl Mooney  has presented today for surgery, with the diagnosis of history of breast cancer.  The various methods of treatment have been discussed with the patient and family. After consideration of risks, benefits and other options for treatment, the patient has consented to  Procedure(s) with comments: Bilateral Lipofilling of breasts for symmetry (Bilateral) - 1.5 hours as a surgical intervention.  The patient's history has been reviewed, patient examined, no change in status, stable for surgery.  I have reviewed the patient's chart and labs.  Questions were answered to the patient's satisfaction.     Cheryl Mooney

## 2020-03-16 NOTE — Anesthesia Procedure Notes (Signed)
Procedure Name: LMA Insertion Date/Time: 03/16/2020 10:14 AM Performed by: Maryella Shivers, CRNA Pre-anesthesia Checklist: Patient identified, Emergency Drugs available, Suction available and Patient being monitored Patient Re-evaluated:Patient Re-evaluated prior to induction Oxygen Delivery Method: Circle system utilized Preoxygenation: Pre-oxygenation with 100% oxygen Induction Type: IV induction Ventilation: Mask ventilation without difficulty LMA: LMA inserted LMA Size: 4.0 Number of attempts: 1 Airway Equipment and Method: Bite block Placement Confirmation: positive ETCO2 Tube secured with: Tape Dental Injury: Teeth and Oropharynx as per pre-operative assessment

## 2020-03-16 NOTE — Anesthesia Preprocedure Evaluation (Signed)
Anesthesia Evaluation  Patient identified by MRN, date of birth, ID band Patient awake    Reviewed: Allergy & Precautions, NPO status , Patient's Chart, lab work & pertinent test results  History of Anesthesia Complications Negative for: history of anesthetic complications  Airway Mallampati: II  TM Distance: >3 FB Neck ROM: Full    Dental  (+) Teeth Intact   Pulmonary Current Smoker,    Pulmonary exam normal        Cardiovascular hypertension, Normal cardiovascular exam     Neuro/Psych Depression negative neurological ROS     GI/Hepatic Neg liver ROS, GERD  ,  Endo/Other  negative endocrine ROS  Renal/GU negative Renal ROS  negative genitourinary   Musculoskeletal negative musculoskeletal ROS (+)   Abdominal   Peds  Hematology negative hematology ROS (+)   Anesthesia Other Findings   Reproductive/Obstetrics                             Anesthesia Physical Anesthesia Plan  ASA: II  Anesthesia Plan: General   Post-op Pain Management:    Induction: Intravenous  PONV Risk Score and Plan: Ondansetron, Dexamethasone, Midazolam and Treatment may vary due to age or medical condition  Airway Management Planned: LMA  Additional Equipment: None  Intra-op Plan:   Post-operative Plan: Extubation in OR  Informed Consent: I have reviewed the patients History and Physical, chart, labs and discussed the procedure including the risks, benefits and alternatives for the proposed anesthesia with the patient or authorized representative who has indicated his/her understanding and acceptance.     Dental advisory given  Plan Discussed with:   Anesthesia Plan Comments:         Anesthesia Quick Evaluation

## 2020-03-17 ENCOUNTER — Encounter (HOSPITAL_BASED_OUTPATIENT_CLINIC_OR_DEPARTMENT_OTHER): Payer: Self-pay | Admitting: Plastic Surgery

## 2020-03-24 ENCOUNTER — Other Ambulatory Visit: Payer: Self-pay

## 2020-03-24 ENCOUNTER — Encounter: Payer: Self-pay | Admitting: Plastic Surgery

## 2020-03-24 ENCOUNTER — Ambulatory Visit (INDEPENDENT_AMBULATORY_CARE_PROVIDER_SITE_OTHER): Payer: BC Managed Care – PPO | Admitting: Plastic Surgery

## 2020-03-24 VITALS — BP 112/80 | HR 74 | Temp 97.8°F

## 2020-03-24 DIAGNOSIS — N651 Disproportion of reconstructed breast: Secondary | ICD-10-CM

## 2020-03-24 NOTE — Progress Notes (Signed)
The patient is a 54 year old female here for follow-up on Lipo filling of her breast for symmetry.  She has bruising and swelling as expected.  There is no sign of infection.  Her abdomen is slightly bruised.  No sign of infection and all incisions healing nicely.  She has been wearing the abdominal binder and sports bra.  She can go into a spanks.  She is planning on doing the nipple areola tattooing and February.  I would like to see her back in 2 weeks.  We will get pictures at that time.

## 2020-04-04 ENCOUNTER — Other Ambulatory Visit: Payer: Self-pay | Admitting: Internal Medicine

## 2020-04-04 DIAGNOSIS — I10 Essential (primary) hypertension: Secondary | ICD-10-CM

## 2020-04-12 ENCOUNTER — Ambulatory Visit: Payer: BC Managed Care – PPO | Admitting: Surgical

## 2020-04-12 ENCOUNTER — Telehealth: Payer: Self-pay | Admitting: Surgical

## 2020-04-12 NOTE — Telephone Encounter (Signed)
Front desk staff notified me yesterday afternoon that patient called saying she had some body aches and chills 2 nights ago.  I called the patient this morning to discuss, she did not answer.  I have notified nursing staff to please reach out to the patient with any questions or concerns.  Of note she is postop from bilateral Leiper filling of breasts for breast asymmetry.  This procedure was approximately 1 month ago.

## 2020-04-21 ENCOUNTER — Encounter: Payer: Self-pay | Admitting: Internal Medicine

## 2020-04-27 ENCOUNTER — Encounter: Payer: Self-pay | Admitting: Surgical

## 2020-04-27 ENCOUNTER — Other Ambulatory Visit: Payer: Self-pay

## 2020-04-27 ENCOUNTER — Ambulatory Visit (INDEPENDENT_AMBULATORY_CARE_PROVIDER_SITE_OTHER): Payer: Self-pay | Admitting: Surgical

## 2020-04-27 DIAGNOSIS — N651 Disproportion of reconstructed breast: Secondary | ICD-10-CM

## 2020-04-27 DIAGNOSIS — Z9889 Other specified postprocedural states: Secondary | ICD-10-CM

## 2020-04-27 NOTE — Progress Notes (Signed)
Patient is a 55 year old female here for follow-up after undergoing Lipo filling of bilateral breast for symmetry after bilateral breast reconstruction due to bilateral mastectomies.  She completed her expansion process and had her implants placed in July 2021.  Patient underwent bilateral Lipo filling on 03/16/2020 with Dr. Marla Roe.  Patient is doing really well, she reports that postoperative pain was quite intense, but she is doing well now and has no complaints in regards to that.  She is very pleased with the contour of her breasts.  She is very pleased with the overall process and is very pleased with her outcome.  She reports she is scheduled next month for bilateral NAC tattooing in our office.  She is looking forward to this.  On exam bilateral breast incisions intact, bilateral breasts are symmetric, superior poles of bilateral breasts have good contour and shape.  No erythema noted.  She does have a small scab in the left mastectomy incision midline where she likely had a Monocryl suture.  No suture noted on exam.  Recommend following up as needed, she does have an appointment in 1 month for an NAC tattooing in our office.  I recommend she call with any questions or concerns or any changes that she is concerned about.  We did take a photo and placed this in her chart with her permission. No sign of infection, seroma, hematoma.

## 2020-05-07 ENCOUNTER — Telehealth: Payer: Self-pay | Admitting: Physician Assistant

## 2020-05-07 DIAGNOSIS — J018 Other acute sinusitis: Secondary | ICD-10-CM

## 2020-05-07 DIAGNOSIS — Z8616 Personal history of COVID-19: Secondary | ICD-10-CM

## 2020-05-07 DIAGNOSIS — J208 Acute bronchitis due to other specified organisms: Secondary | ICD-10-CM

## 2020-05-07 MED ORDER — BENZONATATE 100 MG PO CAPS: 100.0000 mg | ORAL_CAPSULE | Freq: Three times a day (TID) | ORAL | 0 refills | Status: DC | PRN

## 2020-05-07 MED ORDER — AMOXICILLIN-POT CLAVULANATE 875-125 MG PO TABS: 1.0000 | ORAL_TABLET | Freq: Two times a day (BID) | ORAL | 0 refills | Status: DC

## 2020-05-07 MED ORDER — PREDNISONE 10 MG PO TABS: ORAL_TABLET | ORAL | 0 refills | Status: DC

## 2020-05-07 NOTE — Progress Notes (Signed)
E-Visit for Corona Virus Screening  Your current symptoms could be consistent with the coronavirus.  Many health care providers can now test patients at their office but not all are.  [facility] has multiple testing sites. For information on our [place] testing locations and hours go to [email or web address]  We are enrolling you in our [place] for [place] . Daily you will receive a questionnaire within the [facility] website. Our COVID 19 response team will be monitoring your responses daily.  Testing Information: The COVID-19 Community Testing sites are testing BY APPOINTMENT ONLY.  You can schedule online at [email or web address]  If you do not have access to a smart phone or computer you may call [phone] for an appointment.   Additional testing sites in the Community:  . For [facility] Testing sites in [facility]  FaceUpdate.[email or web address]  . For Pop-up testing sites in [place]  BowlDirectory.co.uk  . For [facility] [email or web address]  . For [facility] testing in [employer] and [employer] [email or web address]  . For [facility] testing in [facility]   [email or web address]  For  more information about community testing call [phone]   Please quarantine yourself while awaiting your test results. Please stay home for a minimum of 10 days from the first day of illness with improving symptoms and you have had 24 hours of no fever (without the use of Tylenol (Acetaminophen) Motrin (Ibuprofen) or any fever reducing medication).  Also - Do not get tested prior to returning to work because once you have had a positive test the test can stay  positive for more than a month in some cases.   You should wear a mask or cloth face covering over your nose and mouth if you must be around other people or animals, including pets (even at home). Try to stay at least 6 feet away from other people. This will protect the people around you.  Please continue good preventive care measures, including:  frequent hand-washing, avoid touching your face, cover coughs/sneezes, stay out of crowds and keep a 6 foot distance from others.  COVID-19 is a respiratory illness with symptoms that are similar to the flu. Symptoms are typically mild to moderate, but there have been cases of severe illness and death due to the virus.   The following symptoms may appear 2-14 days after exposure: . Fever . Cough . Shortness of breath or difficulty breathing . Chills . Repeated shaking with chills . Muscle pain . Headache . Sore throat . New loss of taste or smell . Fatigue . Congestion or runny nose . Nausea or vomiting . Diarrhea  Go to the nearest hospital ED for assessment if fever/cough/breathlessness are severe or illness seems like a threat to life.  It is vitally important that if you feel that you have an infection such as this virus or any other virus that you stay home and away from places where you may spread it to others.  You should avoid contact with people age 29 and older.   You can use medication such as A prescription cough medication called Tessalon Perles 100 mg. You may take 1-2 capsules every 8 hours as needed for cough   I have also prescribed for your cough:  Prednisone 10 mg daily for 6 days (see taper instructions below)  Directions for 6 day taper: Day 1: 2 tablets before breakfast, 1 after both lunch & dinner and 2 at bedtime Day 2: 1 tab before breakfast, 1 after both lunch &  dinner and 2 at bedtime Day 3: 1 tab at each meal & 1 at bedtime Day 4: 1 tab at breakfast, 1 at lunch, 1 at bedtime Day 5: 1 tab at breakfast & 1 tab at  bedtime Day 6: 1 tab at breakfast    We are sorry that you are not feeling well.  Here is how we plan to help!  Based on what you have shared with me it looks like you have sinusitis.  Sinusitis is inflammation and infection in the sinus cavities of the head.  Based on your presentation I believe you most likely have Acute Bacterial Sinusitis.  This is an infection caused by bacteria and is treated with antibiotics. I have prescribed Augmentin 875mg /125mg  one tablet twice daily with food, for 7 days. You may use an oral decongestant such as Mucinex D or if you have glaucoma or high blood pressure use plain Mucinex. Saline nasal spray help and can safely be used as often as needed for congestion.  If you develop worsening sinus pain, fever or notice severe headache and vision changes, or if symptoms are not better after completion of antibiotic, please schedule an appointment with a health care provider.    Sinus infections are not as easily transmitted as other respiratory infection, however we still recommend that you avoid close contact with loved ones, especially the very young and elderly.  Remember to wash your hands thoroughly throughout the day as this is the number one way to prevent the spread of infection!  Home Care:  Only take medications as instructed by your medical team.  Complete the entire course of an antibiotic.  Do not take these medications with alcohol.  A steam or ultrasonic humidifier can help congestion.  You can place a towel over your head and breathe in the steam from hot water coming from a faucet.  Avoid close contacts especially the very young and the elderly.  Cover your mouth when you cough or sneeze.  Always remember to wash your hands.  Get Help Right Away If:  You develop worsening fever or sinus pain.  You develop a severe head ache or visual changes.  Your symptoms persist after you have completed your treatment plan.     You may also take  acetaminophen (Tylenol) as needed for fever.  Reduce your risk of any infection by using the same precautions used for avoiding the common cold or flu:  Mar[place] Kitchen Wash your hands often with soap and warm water for at least 20 seconds.  If soap and water are not readily available, use an alcohol-based hand sanitizer with at least 60% alcohol.  . If coughing or sneezing, cover your mouth and nose by coughing or sneezing into the elbow areas of your shirt or coat, into a tissue or into your sleeve (not your hands). . Avoid shaking hands with others and consider head nods or verbal greetings only. . Avoid touching your eyes, nose, or mouth with unwashed hands.  . Avoid close contact with people who are sick. . Avoid places or events with large numbers of people in one location, like concerts or sporting events. . Carefully consider travel plans you have or are making. . If you are planning any travel outside or inside the [place], visit the CDC's Travelers' Health webpage for the latest health notices. . If you have some symptoms but not all symptoms, continue to monitor at home and seek medical attention if your symptoms worsen. . If you are having  a medical emergency, call 911.  HOME CARE . Only take medications as instructed by your medical team. . Drink plenty of fluids and get plenty of rest. . A steam or ultrasonic humidifier can help if you have congestion.   GET HELP RIGHT AWAY IF YOU HAVE EMERGENCY WARNING SIGNS** FOR COVID-19. If you or someone is showing any of these signs seek emergency medical care immediately. Call 911 or proceed to your closest emergency facility if: . You develop worsening high fever. . Trouble breathing . Bluish lips or face . Persistent pain or pressure in the chest . New confusion . Inability to wake or stay awake . You cough up blood. . Your symptoms become more severe  **This list is not all possible symptoms. Contact your medical provider for any symptoms that are  sever or concerning to you.  MAKE SURE YOU   Understand these instructions.  Will watch your condition.  Will get help right away if you are not doing well or get worse.  Your e-visit answers were reviewed by a board certified advanced clinical practitioner to complete your personal care plan.  Depending on the condition, your plan could have included both over the counter or prescription medications.  If there is a problem please reply once you have received a response from your provider.  Your safety is important to [place].  If you have drug allergies check your prescription carefully.    You can use MyChart to ask questions about today's visit, request a non-urgent call back, or ask for a work or school excuse for 24 hours related to this e-Visit. If it has been greater than 24 hours you will need to follow up with your provider, or enter a new e-Visit to address those concerns. You will get an e-mail in the next two days asking about your experience.  I hope that your e-visit has been valuable and will speed your recovery. Thank you for using e-visits.   I spent 5-10 minutes on review and completion of this note- Lacy Duverney [facility]

## 2020-05-15 ENCOUNTER — Ambulatory Visit (INDEPENDENT_AMBULATORY_CARE_PROVIDER_SITE_OTHER): Payer: BC Managed Care – PPO

## 2020-05-15 ENCOUNTER — Other Ambulatory Visit: Payer: Self-pay

## 2020-05-15 VITALS — BP 143/90 | HR 80 | Temp 97.9°F | Wt 153.0 lb

## 2020-05-15 DIAGNOSIS — Z9013 Acquired absence of bilateral breasts and nipples: Secondary | ICD-10-CM

## 2020-05-17 ENCOUNTER — Encounter: Payer: Self-pay | Admitting: Internal Medicine

## 2020-05-26 ENCOUNTER — Other Ambulatory Visit: Payer: Self-pay

## 2020-05-26 DIAGNOSIS — Z1501 Genetic susceptibility to malignant neoplasm of breast: Secondary | ICD-10-CM

## 2020-05-26 DIAGNOSIS — Z1502 Genetic susceptibility to malignant neoplasm of ovary: Secondary | ICD-10-CM

## 2020-05-26 NOTE — Progress Notes (Signed)
NIPPLE AREOLAR TATTOO PROCEDURE  PREOPERATIVE DIAGNOSIS:  Acquired absence of (BILATERAL) nipple areolar   POSTOPERATIVE DIAGNOSIS: Acquired absence of (BILATERAL) nipple areolar    PROCEDURES: (BILATERAL) nipple areolar tattoo   ATTENDING SURGEON: Dr. Lyndee Leo Dillingham   ANESTHESIA:  EMLA applied by pt 30 mins prior to procedure  COMPLICATIONS: None.  JUSTIFICATION FOR PROCEDURE:  Ms. Lagerquist is a 55 y.o. female with a history of breast cancer status post bilateral breast reconstruction. The patient presents for nipple areolar complex tattoo. Risks, benefits, indications, and alternatives of the above described procedures were discussed with the patient and all the patient's questions were answered.   DESCRIPTION OF PROCEDURE: After informed consent was obtained and proper identification of patient and surgical site was made, the patient was taken to the procedure room and pre-procedure photos were taken & entered into chart. Placement/size/color/shape chosen by pt Pt was then placed supine on the operating room table. A time out was performed to confirm patient's identity and surgical site. The patient was prepped and draped in the usual sterile fashion.   Using a #7 tattoo head, pigment was instilled to the designed nipple areolar complex.  once adequate pigment had been applied to the nipple areolar complex, a post-procedure photos taken. vaseline/ xeroform & gauze dressing was applied.  The patient tolerated the procedure well.  Post-procedure instructions reviewed with pt.  Tattoo Ink used= [employer]amous Ink: Josph Macho Skin - UGQ#[id number] / exp 11/21/22 Illa Level - lot# [id number] / exp 08/17/22 Warm Mink lot#[id number] / exp 03/17/22 Cool Honey lot# [id number] / exp 01/05/22 Cool Peach lot#[id number] / exp 03/17/22 Ultra Duration-lot# [id number] / exp 3/23 Used as needed for anesthesia

## 2020-05-26 NOTE — Patient Instructions (Signed)
Reviewed after care instructions & provided a copy to pt for the following: Use vaseline/xeroform & gauze dressing for approx 10 days She understands that the areas may be red/swollen & irritated for up to 2 weeks. She can use OTC Ibuprofen/Tylenol for pain. I informed her that the colors/shade may fade up to 40% which would require touch-up tattoo sessions She will call for any concerns or questions - otherwise f/u in approx 6 weeks

## 2020-05-29 ENCOUNTER — Encounter: Payer: Self-pay | Admitting: Oncology

## 2020-05-29 ENCOUNTER — Inpatient Hospital Stay: Payer: BC Managed Care – PPO | Attending: Oncology

## 2020-05-29 ENCOUNTER — Inpatient Hospital Stay (HOSPITAL_BASED_OUTPATIENT_CLINIC_OR_DEPARTMENT_OTHER): Payer: BC Managed Care – PPO | Admitting: Oncology

## 2020-05-29 VITALS — BP 149/93 | HR 83 | Temp 97.2°F | Resp 18 | Wt 155.2 lb

## 2020-05-29 DIAGNOSIS — Z86010 Personal history of colonic polyps: Secondary | ICD-10-CM | POA: Insufficient documentation

## 2020-05-29 DIAGNOSIS — Z801 Family history of malignant neoplasm of trachea, bronchus and lung: Secondary | ICD-10-CM | POA: Insufficient documentation

## 2020-05-29 DIAGNOSIS — Z8249 Family history of ischemic heart disease and other diseases of the circulatory system: Secondary | ICD-10-CM | POA: Insufficient documentation

## 2020-05-29 DIAGNOSIS — Z1502 Genetic susceptibility to malignant neoplasm of ovary: Secondary | ICD-10-CM

## 2020-05-29 DIAGNOSIS — M810 Age-related osteoporosis without current pathological fracture: Secondary | ICD-10-CM | POA: Diagnosis not present

## 2020-05-29 DIAGNOSIS — C50511 Malignant neoplasm of lower-outer quadrant of right female breast: Secondary | ICD-10-CM | POA: Diagnosis not present

## 2020-05-29 DIAGNOSIS — Z818 Family history of other mental and behavioral disorders: Secondary | ICD-10-CM | POA: Diagnosis not present

## 2020-05-29 DIAGNOSIS — D1803 Hemangioma of intra-abdominal structures: Secondary | ICD-10-CM | POA: Insufficient documentation

## 2020-05-29 DIAGNOSIS — Z90710 Acquired absence of both cervix and uterus: Secondary | ICD-10-CM | POA: Insufficient documentation

## 2020-05-29 DIAGNOSIS — Z1501 Genetic susceptibility to malignant neoplasm of breast: Secondary | ICD-10-CM | POA: Insufficient documentation

## 2020-05-29 DIAGNOSIS — Z800 Family history of malignant neoplasm of digestive organs: Secondary | ICD-10-CM | POA: Diagnosis not present

## 2020-05-29 DIAGNOSIS — Z803 Family history of malignant neoplasm of breast: Secondary | ICD-10-CM | POA: Diagnosis not present

## 2020-05-29 DIAGNOSIS — M8000XA Age-related osteoporosis with current pathological fracture, unspecified site, initial encounter for fracture: Secondary | ICD-10-CM | POA: Insufficient documentation

## 2020-05-29 DIAGNOSIS — Z79899 Other long term (current) drug therapy: Secondary | ICD-10-CM | POA: Diagnosis not present

## 2020-05-29 DIAGNOSIS — Z90722 Acquired absence of ovaries, bilateral: Secondary | ICD-10-CM | POA: Diagnosis not present

## 2020-05-29 DIAGNOSIS — Z170 Estrogen receptor positive status [ER+]: Secondary | ICD-10-CM | POA: Diagnosis present

## 2020-05-29 DIAGNOSIS — Z79811 Long term (current) use of aromatase inhibitors: Secondary | ICD-10-CM

## 2020-05-29 DIAGNOSIS — Z823 Family history of stroke: Secondary | ICD-10-CM | POA: Insufficient documentation

## 2020-05-29 DIAGNOSIS — F17210 Nicotine dependence, cigarettes, uncomplicated: Secondary | ICD-10-CM | POA: Insufficient documentation

## 2020-05-29 DIAGNOSIS — Z1509 Genetic susceptibility to other malignant neoplasm: Secondary | ICD-10-CM

## 2020-05-29 LAB — CBC WITH DIFFERENTIAL/PLATELET
Abs Immature Granulocytes: 0.03 10*3/uL (ref 0.00–0.07)
Basophils Absolute: 0.1 10*3/uL (ref 0.0–0.1)
Basophils Relative: 1 %
Eosinophils Absolute: 0.3 10*3/uL (ref 0.0–0.5)
Eosinophils Relative: 3 %
HCT: 42.3 % (ref 36.0–46.0)
Hemoglobin: 14.2 g/dL (ref 12.0–15.0)
Immature Granulocytes: 0 %
Lymphocytes Relative: 32 %
Lymphs Abs: 3 10*3/uL (ref 0.7–4.0)
MCH: 31.1 pg (ref 26.0–34.0)
MCHC: 33.6 g/dL (ref 30.0–36.0)
MCV: 92.6 fL (ref 80.0–100.0)
Monocytes Absolute: 0.6 10*3/uL (ref 0.1–1.0)
Monocytes Relative: 6 %
Neutro Abs: 5.3 10*3/uL (ref 1.7–7.7)
Neutrophils Relative %: 58 %
Platelets: 329 10*3/uL (ref 150–400)
RBC: 4.57 MIL/uL (ref 3.87–5.11)
RDW: 13.1 % (ref 11.5–15.5)
WBC: 9.2 10*3/uL (ref 4.0–10.5)
nRBC: 0 % (ref 0.0–0.2)

## 2020-05-29 LAB — COMPREHENSIVE METABOLIC PANEL
ALT: 17 U/L (ref 0–44)
AST: 22 U/L (ref 15–41)
Albumin: 4.5 g/dL (ref 3.5–5.0)
Alkaline Phosphatase: 78 U/L (ref 38–126)
Anion gap: 13 (ref 5–15)
BUN: 10 mg/dL (ref 6–20)
CO2: 25 mmol/L (ref 22–32)
Calcium: 9.3 mg/dL (ref 8.9–10.3)
Chloride: 97 mmol/L — ABNORMAL LOW (ref 98–111)
Creatinine, Ser: 0.67 mg/dL (ref 0.44–1.00)
GFR, Estimated: >60 mL/min (ref >60)
Glucose, Bld: 134 mg/dL — ABNORMAL HIGH (ref 70–99)
Potassium: 3.6 mmol/L (ref 3.5–5.1)
Sodium: 135 mmol/L (ref 135–145)
Total Bilirubin: 0.7 mg/dL (ref 0.3–1.2)
Total Protein: 7.5 g/dL (ref 6.5–8.1)

## 2020-05-29 MED ORDER — DULOXETINE HCL 30 MG PO CPEP: 30.0000 mg | ORAL_CAPSULE | Freq: Every day | ORAL | 1 refills | Status: DC

## 2020-05-29 MED ORDER — ANASTROZOLE 1 MG PO TABS
1.0000 mg | ORAL_TABLET | Freq: Every day | ORAL | 1 refills | Status: DC
Start: 2020-05-29 — End: 2020-08-24

## 2020-05-29 NOTE — Progress Notes (Signed)
Patient here for follow up. Pt reports that she has been sleeping a lot. No new breast problems.

## 2020-05-29 NOTE — Progress Notes (Signed)
Hematology/Oncology follow up  note [facility] Telephone:([phone] Fax:(336) [phone]   Patient Care Team: Glean Hess, MD as PCP - General (Internal Medicine) Jannet Mantis, MD (Dermatology) Rico Junker, RN as Registered Nurse Herbert Pun, MD as Consulting Physician (General Surgery) Earlie Server, MD as Consulting Physician (Oncology) Dillingham, Loel Lofty, DO as Attending Physician (Plastic Surgery)  CHIEF COMPLAINTS/REASON FOR VISIT:  Follow-up for breast cancer.  HISTORY OF PRESENTING ILLNESS:  Cheryl Mooney is a  55 y.o.  female with PMH listed below who was referred to me for evaluation of breast cancer Patient reports feeling mass of her right breast week before her annual mammogram. 12/03/2018 patient had bilateral diagnostic mammogram and ultrasound  which showed suspicious 2.1 x 1.3 x 1.6 irregular mass at 7:00 in the right breast, 2 cm from the nipple. 2 borderline lymph nodes are seen in the right axilla.  1 of the nodes demonstrate a cortex of 4.4 mm.  Both lymph nodes demonstrated retention of fatty hila. Patient underwent ultrasound-guided biopsy of right breast mass as well as ultrasound-guided biopsy of 1 of the 2 mildly abnormal right axillary lymph nodes. 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 Right axilla lymph node negative for malignancy. ER> 90% positive, PR11-50% positive, HER-2 equivocal, 2+ by IHC.  FISH negative.   Nipple discharge: Denies Family history: Mother diagnosed with breast cancer at age of 60, and colon cancer at age of 33.  Mother has BRCA2 mutation.   Maternal grandmother breast cancer, maternal great aunt breast cancer.  Patient recalls that she was tested long time ago and was not aware of any results.  OCP use: In her 20s-30s Estrogen and progesterone therapy: denies History of radiation to chest: denies.   Previous breast surgery: Denies  BRCA 2 positive.  # 01/14/2019 patient underwent bilateral mastectomy with sentinel lymph node biopsy of left and the right axillary. Right breast showed invasive mammary carcinoma, DCIS positive, apocrine metaplastic, stromal fibrosis, duct ectasia, simple cyst formation, usual epithelial hyperplasia.  Sentinel lymph node on the right side was negative. pT2 pN0 Left prophylactic mastectomy and a sentinel lymph node negative for malignancy.  #OncotypeDX recurrence score 31, adjuvant chemotherapy benefit more than 15%. # She follows up with plastic surgeon Dr. Marla Roe, she has expander, expanding process is being during chemotherapy. Mediport was placed by Dr. Peyton Najjar on 02/18/2019.  Patient had 1 cycle of AC, chemotherapy plan was switched to docetaxel and carboplatin after consulting expert opinion. She has finished 4 cycle of docetaxel and carboplatin. May 2021, patient underwent laparoscopic-assisted vaginal hysterectomy with bilateralsalpingectomy and oophorectomy 10/13/19 bilateral breast reconstruction  Porta cath removal.   INTERVAL HISTORY Cheryl Mooney is a 55 y.o. female who has above history reviewed by me today presents for follow up visit for management of stage I breast cancer, ER PR positive, HER-2 negative, hereditary BRCA 2 mutation.  Problems and complaints are listed below: Patient takes Arimidex daily.  manageble side effects.  Covid 19 infection in Jan 2022. She feels tired and fatigued recently.  She wonders if Cymbalta may have some side effects.  03/16/20 treatment for bilateral breast asymmetry treatment.   ..   Review of Systems  Constitutional: Positive for fatigue. Negative for appetite change, chills and fever.  HENT:   Negative for hearing loss and voice change.   Eyes: Negative for eye problems.  Respiratory: Negative for chest tightness and cough.   Cardiovascular:  Negative for chest pain.  Gastrointestinal:  Negative for abdominal distention, abdominal pain, blood in stool and diarrhea.  Endocrine: Positive for hot flashes.  Genitourinary: Negative for difficulty urinating and frequency.   Musculoskeletal: Negative for arthralgias.  Skin: Negative for itching and rash.  Neurological: Negative for extremity weakness.  Hematological: Negative for adenopathy.  Psychiatric/Behavioral: Negative for confusion. The patient is not nervous/anxious.     MEDICAL HISTORY:  Past Medical History:  Diagnosis Date  . Anemia    things seem back to normal  . Benign neoplasm of sigmoid colon   . Benign neoplasm of transverse colon   . Breast cancer ([place])   . Depression   . Family history of BRCA2 gene positive   . Family history of breast cancer   . Family history of colon cancer   . Family history of lung cancer   . Family history of pancreatic cancer   . GERD (gastroesophageal reflux disease)   . History of kidney stones   . Hypertension   . Malignant neoplasm of lower-outer quadrant of right breast of female, estrogen receptor positive ([place]) 12/17/2018  . Smoker     SURGICAL HISTORY: Past Surgical History:  Procedure Laterality Date  . ABDOMINAL HYSTERECTOMY    . AXILLARY SENTINEL NODE BIOPSY Bilateral 01/14/2019   Procedure: AXILLARY SENTINEL NODE BIOPSY;  Surgeon: Herbert Pun, MD;  Location: [facility];  Service: General;  Laterality: Bilateral;  . BREAST BIOPSY Right 12/08/2018   [place] bx of mass with calcs path pending  . BREAST BIOPSY Right 12/08/2018   [place] bx of LN, path pending  . BREAST RECONSTRUCTION WITH PLACEMENT OF TISSUE EXPANDER AND ALLODERM Bilateral 01/14/2019   Procedure: BILATERAL BREAST RECONSTRUCTION WITH PLACEMENT OF TISSUE EXPANDER AND FLEX HD;  Surgeon: Wallace Going, DO;  Location: [facility];  Service: Plastics;  Laterality: Bilateral;  . COLONOSCOPY WITH PROPOFOL N/A 02/17/2015   Procedure: COLONOSCOPY WITH PROPOFOL;  Surgeon: Lucilla Lame, MD;  Location: [place];  Service: Endoscopy;  Laterality: N/A;  . COLONOSCOPY WITH PROPOFOL N/A 07/12/2019   Procedure: COLONOSCOPY WITH BIOPSY;  Surgeon: Lucilla Lame, MD;  Location: [place];  Service: Endoscopy;  Laterality: N/A;  priority 3 PORT A CATH NEEDS ACCESSED  . CYSTOSCOPY N/A 08/05/2019   Procedure: CYSTOSCOPY;  Surgeon: Homero Fellers, MD;  Location: [facility];  Service: Gynecology;  Laterality: N/A;  . LIPOSUCTION WITH LIPOFILLING Bilateral 03/16/2020   Procedure: Bilateral Lipofilling of breasts for symmetry;  Surgeon: Wallace Going, DO;  Location: [place];  Service: Plastics;  Laterality: Bilateral;  1.5 hours  . POLYPECTOMY  02/17/2015   Procedure: POLYPECTOMY;  Surgeon: Lucilla Lame, MD;  Location: [place];  Service: Endoscopy;;  . POLYPECTOMY N/A 07/12/2019   Procedure: POLYPECTOMY;  Surgeon: Lucilla Lame, MD;  Location: [place];  Service: Endoscopy;  Laterality: N/A;  . PORTA CATH REMOVAL Left 10/13/2019   Procedure: PORTA CATH REMOVAL;  Surgeon: Wallace Going, DO;  Location: [place];  Service: Plastics;  Laterality: Left;  . PORTACATH PLACEMENT Left 02/18/2019   Procedure: INSERTION PORT-A-CATH;  Surgeon: Herbert Pun, MD;  Location: [facility];  Service: General;  Laterality: Left;  . REMOVAL OF BILATERAL TISSUE EXPANDERS WITH PLACEMENT OF BILATERAL BREAST IMPLANTS Bilateral 10/13/2019   Procedure: REMOVAL OF BILATERAL TISSUE EXPANDERS WITH PLACEMENT OF BILATERAL BREAST IMPLANTS;  Surgeon: Wallace Going, DO;  Location: [place];  Service: Plastics;  Laterality: Bilateral;  2 hours  please  . SIMPLE MASTECTOMY WITH AXILLARY SENTINEL NODE BIOPSY Left 01/14/2019   Procedure: LEFT SIMPLE MASTECTOMY;  Surgeon: Herbert Pun, MD;  Location: [facility];  Service: General;  Laterality: Left;  . TOTAL LAPAROSCOPIC HYSTERECTOMY WITH BILATERAL SALPINGO OOPHORECTOMY Bilateral  08/05/2019   Procedure: TOTAL LAPAROSCOPIC HYSTERECTOMY WITH BILATERAL SALPINGO OOPHORECTOMY;  Surgeon: Homero Fellers, MD;  Location: [facility];  Service: Gynecology;  Laterality: Bilateral;  . TOTAL MASTECTOMY Right 01/14/2019   Procedure: RIGHT TOTAL MASTECTOMY;  Surgeon: Herbert Pun, MD;  Location: [facility];  Service: General;  Laterality: Right;  . WISDOM TOOTH EXTRACTION      SOCIAL HISTORY: Social History   Socioeconomic History  . Marital status: Married    Spouse name: Jenny Reichmann  . Number of children: Not on file  . Years of education: Not on file  . Highest education level: Not on file  Occupational History  . Occupation: Physicist, medical    Comment: retired  Tobacco Use  . Smoking status: Current Some Day Smoker    Packs/day: 0.05    Years: 20.00    Pack years: 1.00    Types: Cigarettes    Last attempt to quit: 01/07/2019    Years since quitting: 1.3  . Smokeless tobacco: Never Used  . Tobacco comment: 4 cigs a day.   Vaping Use  . Vaping Use: Never used  Substance and Sexual Activity  . Alcohol use: Yes    Alcohol/week: 2.0 standard drinks    Types: 2 Standard drinks or equivalent per week    Comment: occasional  . Drug use: No  . Sexual activity: Yes    Birth control/protection: Surgical  Other Topics Concern  . Not on file  Social History Narrative   Patient lives with husband.   Social Determinants of Health   Financial Resource Strain: Not on file  Food Insecurity: Not on file  Transportation Needs: Not on file  Physical Activity: Not on file  Stress: Not on file  Social Connections: Not on file  Intimate Partner Violence: Not on file  She is a retired Pharmacist, hospital  FAMILY HISTORY: Family History  Problem Relation Age of Onset  . Colon cancer Mother 73  . Breast cancer Mother 72       "BRCA2+"  . Hypertension Father   . Stroke Father   . Alzheimer's disease Father   . Prostate cancer Father   . Breast cancer Maternal Aunt        mat  great aunt  . Breast cancer Maternal Grandmother 59  . Pancreatic cancer Maternal Grandfather 60    ALLERGIES:  has No Known Allergies.  MEDICATIONS:  Current Outpatient Medications  Medication Sig Dispense Refill  . acetaminophen (TYLENOL) 500 MG tablet Take 2 tablets (1,000 mg total) by mouth every 6 (six) hours as needed for mild pain. 30 tablet 0  . cetirizine (ZYRTEC) 10 MG tablet Take 10 mg by mouth daily.    . DULoxetine (CYMBALTA) 30 MG capsule Take 1 capsule (30 mg total) by mouth daily. 90 capsule 1  . lisinopril-hydrochlorothiazide (ZESTORETIC) 20-25 MG tablet Take 1 tablet by mouth once daily 90 tablet 0  . multivitamin-iron-minerals-folic acid (CENTRUM) chewable tablet Chew 1 tablet by mouth daily.    Marland Kitchen omeprazole (PRILOSEC) 40 MG capsule Take 1 capsule by mouth once daily 90 capsule 3  . ondansetron (ZOFRAN) 4 MG tablet Take 1 tablet (4 mg total) by mouth every 8 (eight) hours as needed for nausea or vomiting. 20 tablet 0  .  anastrozole (ARIMIDEX) 1 MG tablet Take 1 tablet (1 mg total) by mouth daily. 90 tablet 1   No current facility-administered medications for this visit.     PHYSICAL EXAMINATION: ECOG PERFORMANCE STATUS: 0 - Asymptomatic Vitals:   05/29/20 1313  BP: (!) 149/93  Pulse: 83  Resp: 18  Temp: (!) 97.2 F (36.2 C)   Filed Weights   05/29/20 1313  Weight: 155 lb 3.2 oz (70.4 kg)    Physical Exam Constitutional:      General: She is not in acute distress. HENT:     Head: Normocephalic and atraumatic.  Eyes:     General: No scleral icterus.    Pupils: Pupils are equal, round, and reactive to light.  Cardiovascular:     Rate and Rhythm: Normal rate and regular rhythm.     Heart sounds: Normal heart sounds.  Pulmonary:     Effort: Pulmonary effort is normal. No respiratory distress.     Breath sounds: No wheezing.  Abdominal:     General: Bowel sounds are normal. There is no distension.     Palpations: Abdomen is soft. There is no mass.      Tenderness: There is no abdominal tenderness.  Musculoskeletal:        General: No deformity. Normal range of motion.     Cervical back: Normal range of motion and neck supple.  Skin:    General: Skin is warm and dry.     Findings: No erythema or rash.  Neurological:     Mental Status: She is alert and oriented to person, place, and time. Mental status is at baseline.     Cranial Nerves: No cranial nerve deficit.     Coordination: Coordination normal.  Psychiatric:        Mood and Affect: Mood normal.    LABORATORY DATA:  I have reviewed the data as listed Lab Results  Component Value Date   WBC 9.2 05/29/2020   HGB 14.2 05/29/2020   HCT 42.3 05/29/2020   MCV 92.6 05/29/2020   PLT 329 05/29/2020   Recent Labs    07/05/19 1309 08/24/19 1319 10/06/19 1308 10/06/19 1308 02/07/20 0955 02/24/20 1350 03/13/20 1236 05/29/20 1258  NA 136 133* 135  --  136 134* 132* 135  K 3.5 3.4* 3.5  --  3.9 3.7 3.8 3.6  CL 105 99 100  --  101 96* 98 97*  CO2 23 24 24   --  24 25 20* 25  GLUCOSE 158* 189* 147*  --  128* 155* 219* 134*  BUN 8 8 8   --  10 9 9 10   CREATININE 0.61 0.71 0.61  --  0.73 0.71 0.75 0.67  CALCIUM 9.1 9.5 9.2  --  9.4 9.7 9.3 9.3  GFRNONAA >60 >60 >60   < > >60 >60 >60 >60  GFRAA >60 >60 >60  --   --   --   --   --   PROT 7.1 7.4 7.6  --  7.1  --   --  7.5  ALBUMIN 4.1 4.3 4.3  --  4.1  --   --  4.5  AST 19 19 19   --  19  --   --  22  ALT 14 13 13   --  13  --   --  17  ALKPHOS 103 102 82  --  69  --   --  78  BILITOT 0.3 0.5 0.7  --  0.6  --   --  0.7   < > = values in this interval not displayed.   Iron/TIBC/Ferritin/ %Sat No results found for: IRON, TIBC, FERRITIN, IRONPCTSAT   RADIOGRAPHIC STUDIES: I have personally reviewed the radiological images as listed and agreed with the findings in the report. MR ABDOMEN W WO CONTRAST  Result Date: 03/01/2020 CLINICAL DATA:  Personal history of BRCA mutation and family history of pancreatic cancer. EXAM: MRI  ABDOMEN WITHOUT AND WITH CONTRAST TECHNIQUE: Multiplanar multisequence MR imaging of the abdomen was performed both before and after the administration of intravenous contrast. CONTRAST:  26m GADAVIST GADOBUTROL 1 MMOL/ML IV SOLN COMPARISON:  Ultrasound of the abdomen of Aug 20, 2019. FINDINGS: Lower chest: Lung bases are clear. No consolidation. No pleural effusion. Limited assessment on MRI. Hepatobiliary: Hemangioma in the LEFT hepatic lobe with flash fill characteristics, follows blood pool on all submitted phases and shows marked T2 hyperintensity no pericholecystic stranding. No biliary duct dilation. Small amount of sludge in the dependent gallbladder. Pancreas: Normal intrinsic T1 signal and pancreatic parenchyma without signs of mass, ductal dilation or evidence of divisum. Spleen:  Normal Adrenals/Urinary Tract: Adrenal glands are normal. Symmetric renal enhancement. No signs of hydronephrosis. No suspicious renal lesion. Stomach/Bowel: No acute gastrointestinal process to the extent evaluated. Pelvic bowel loops are not imaged. Vascular/Lymphatic: Normal caliber abdominal aorta. No abdominal lymphadenopathy. Other:  No abdominal ascites. Musculoskeletal: Post bilateral mastectomy with breast implants. No suspicious bone lesion. IMPRESSION: 1. No evidence of pancreatic mass. 2. Hemangioma in the LEFT hepatic lobe. 3. Small amount of sludge in the dependent gallbladder. Electronically Signed   By: GZetta BillsM.D.   On: 03/01/2020 22:34   DG Bone Density  Result Date: 03/01/2020 EXAM: DUAL X-RAY ABSORPTIOMETRY (DXA) FOR BONE MINERAL DENSITY IMPRESSION: Your patient DTalyia Allendecompleted a BMD test on 03/01/2020 using the [place](analysis version: 14.10) manufactured by [employer] The following summarizes the results of our evaluation. PATIENT BIOGRAPHICAL: Name: GAvrianna, SmartPatient ID: [id number] Date: 007/20/1967Height: 64.0 in. Gender: Female Exam Date:  03/01/2020 Weight: 154.2 lbs. Indications: Bilateral Ovariectomy, Caucasian, Height Loss, History of Fracture (Adult), hx breast ca, Hysterectomy, POSTmenopausal, Tobacco User Fractures: Right Wrist Treatments: anastrozole, calcium /vit d, zometa ASSESSMENT: The BMD measured at AP Spine L1-L4 is 0.873 g/cm2 with a T-score of -2.6. This patient is considered osteoporotic according to [place] criteria. The scan quality is good. Site Region Measured Measured WHO Young Adult BMD Date       Age      Classification T-score AP Spine L1-L4 1/01/[age] 56.6 Osteoporosis -2.6 0.873 g/cm2 AP Spine L1-L4 03/01/2020 56.6 Osteopenia -2.4 0.894 g/cm2 DualFemur Neck Left 03/01/2020 56.6 Osteopenia -2.3 0.714 g/cm2 DualFemur Neck Left 03/01/2020 56.6 Osteoporosis -2.5 0.685 g/cm2 World Health Organization ([facility] criteria for post-menopausal, Caucasian Women: Normal:       T-score at or above -1 SD Osteopenia:   T-score between -1 and -2.5 SD Osteoporosis: T-score at or below -2.5 SD RECOMMENDATIONS: 1. All patients should optimize calcium and vitamin D intake. 2. Consider FDA-approved medical therapies in postmenopausal women and men aged 572years and older, based on the following: a. A hip or vertebral (clinical or morphometric) fracture b. T-score < -2.5 at the femoral neck or spine after appropriate evaluation to exclude secondary causes c. Low bone mass (T-score between -1.0 and -2.5 at the femoral neck or spine) and a 10-year probability of a hip fracture > 3% or a 10-year probability of a  major osteoporosis-related fracture > 20% based on the [place]-adapted WHO algorithm d. Clinician judgment and/or patient preferences may indicate treatment for people with 10-year fracture probabilities above or below these levels FOLLOW-UP: People with diagnosed cases of osteoporosis or at high risk for fracture should have regular bone mineral density tests. For patients eligible for Medicare, routine testing is allowed  once every 2 years. The testing frequency can be increased to one year for patients who have rapidly progressing disease, those who are receiving or discontinuing medical therapy to restore bone mass, or have additional risk factors. Electronically Signed   By: Marlaine Hind M.D.   On: 03/01/2020 10:16      ASSESSMENT & PLAN:  1. BRCA2 gene mutation positive in female   2. Aromatase inhibitor use   3. Osteoporosis, unspecified osteoporosis type, unspecified pathological fracture presence    Stage IB pT2 pN0 M0 ER + PR+ HER2 negative right breast cancer, grade 3, +LVI,   -BRCA2 positive So far she tolerates adjuvant endocrine therapy with Arimidex.   Continue Arimidex.   # Osteoporosis. Continue calcium and vitamin D supplementation.  Zometa every 6 months. Discussed about option of switch to Tamoxifen and she prefers to continue Arimidex.  Recommend exercise as tolerated.  #BRCA2 gene mutation positive,status post hysterectomy with bilateral salpingo-oophorectomy. She has a family history of pancreatic cancer.  03/01/20 MRI abdomen w/wo no evidence of pancreatic mass. hemangioma in left hepatic lobe.   #Port-A-Cath is removed.   All questions were answered. The patient knows to call the clinic with any problems questions or concerns. Follow-up in 3 months.   Earlie Server, MD, PhD Hematology Oncology [facility] Pager- [phone] 05/29/2020

## 2020-05-31 ENCOUNTER — Ambulatory Visit: Payer: BC Managed Care – PPO | Admitting: Internal Medicine

## 2020-05-31 ENCOUNTER — Other Ambulatory Visit: Payer: Self-pay

## 2020-05-31 ENCOUNTER — Encounter: Payer: Self-pay | Admitting: Internal Medicine

## 2020-05-31 VITALS — BP 140/92 | HR 77 | Temp 97.9°F | Ht 64.0 in | Wt 157.0 lb

## 2020-05-31 DIAGNOSIS — Z23 Encounter for immunization: Secondary | ICD-10-CM | POA: Diagnosis not present

## 2020-05-31 DIAGNOSIS — R0981 Nasal congestion: Secondary | ICD-10-CM | POA: Diagnosis not present

## 2020-05-31 DIAGNOSIS — I10 Essential (primary) hypertension: Secondary | ICD-10-CM

## 2020-05-31 DIAGNOSIS — C50511 Malignant neoplasm of lower-outer quadrant of right female breast: Secondary | ICD-10-CM | POA: Diagnosis not present

## 2020-05-31 DIAGNOSIS — Z170 Estrogen receptor positive status [ER+]: Secondary | ICD-10-CM

## 2020-05-31 NOTE — Patient Instructions (Addendum)
Monitor your BP at home. Send me the readings in a few weeks via Mychart. Goal BP is 135/80 or less  Use Flonase nasal spray for sinus symptoms

## 2020-05-31 NOTE — Progress Notes (Signed)
Date:  05/31/2020   Name:  Cheryl Mooney   DOB:  [birth year]   MRN:  [id number]   Chief Complaint: Hypertension and Flu Vaccine  Hypertension This is a chronic problem. The problem is controlled (not checking at home - has been somewhat elevated in other offices). Pertinent negatives include no chest pain, headaches, palpitations or shortness of breath. There are no associated agents to hypertension. Past treatments include ACE inhibitors and diuretics. The current treatment provides significant improvement. There are no compliance problems.  There is no history of kidney disease, CAD/MI or CVA.  Sinus Problem This is a new problem. The current episode started 1 to 4 weeks ago (since covid infection). The problem has been gradually improving since onset. There has been no fever. Associated symptoms include congestion and sinus pressure. Pertinent negatives include no coughing, diaphoresis, ear pain, headaches, hoarse voice, shortness of breath or sore throat. Treatments tried: treated by e-visit with amoxicllin. The treatment provided moderate relief.    Lab Results  Component Value Date   CREATININE 0.67 05/29/2020   BUN 10 05/29/2020   NA 135 05/29/2020   K 3.6 05/29/2020   CL 97 (L) 05/29/2020   CO2 25 05/29/2020   Lab Results  Component Value Date   CHOL 264 (H) 11/25/2019   HDL 59 11/25/2019   LDLCALC 183 (H) 11/25/2019   TRIG 121 11/25/2019   CHOLHDL 4.5 (H) 11/25/2019   Lab Results  Component Value Date   TSH 1.250 11/25/2019   Lab Results  Component Value Date   HGBA1C 5.9 (H) 11/25/2019   Lab Results  Component Value Date   WBC 9.2 05/29/2020   HGB 14.2 05/29/2020   HCT 42.3 05/29/2020   MCV 92.6 05/29/2020   PLT 329 05/29/2020   Lab Results  Component Value Date   ALT 17 05/29/2020   AST 22 05/29/2020   ALKPHOS 78 05/29/2020   BILITOT 0.7 05/29/2020     Review of Systems  Constitutional: Negative for diaphoresis, fatigue and unexpected weight  change.  HENT: Positive for congestion, postnasal drip and sinus pressure. Negative for ear pain, hoarse voice, nosebleeds, sore throat and trouble swallowing.   Eyes: Negative for visual disturbance.  Respiratory: Negative for cough, chest tightness, shortness of breath and wheezing.   Cardiovascular: Negative for chest pain, palpitations and leg swelling.  Gastrointestinal: Negative for abdominal pain, constipation and diarrhea.  Neurological: Negative for dizziness, weakness, light-headedness and headaches.    Patient Active Problem List   Diagnosis Date Noted  . Breast asymmetry following reconstructive surgery 02/22/2020  . Vitamin D deficiency 11/26/2019  . Prediabetes 11/26/2019  . S/P breast reconstruction, bilateral 11/07/2019  . Carrier of high risk cancer gene mutation   . Personal history of colonic polyps   . Polyp of sigmoid colon   . Acquired absence of bilateral breasts and nipples 07/06/2019  . Goals of care, counseling/discussion 03/26/2019  . Hyponatremia 03/08/2019  . Constipation 03/08/2019  . Chest wall pain following surgery 03/08/2019  . Breast cancer ([place]) 01/14/2019  . Encounter for antineoplastic chemotherapy 12/30/2018  . BRCA2 gene mutation positive in female 12/30/2018  . Family history of pancreatic cancer   . Family history of lung cancer   . Family history of BRCA2 gene positive   . Family history of colon cancer   . Malignant neoplasm of lower-outer quadrant of right breast of female, estrogen receptor positive ([place]) 12/17/2018  . Hyperlipidemia, mixed 11/19/2016  . Tubular adenoma of colon  02/21/2015  . Tobacco use disorder 11/27/2014  . Essential hypertension 11/27/2014  . Hx of abnormal cervical Pap smear 09/07/2011    No Known Allergies  Past Surgical History:  Procedure Laterality Date  . ABDOMINAL HYSTERECTOMY    . AXILLARY SENTINEL NODE BIOPSY Bilateral 01/14/2019   Procedure: AXILLARY SENTINEL NODE BIOPSY;  Surgeon: Herbert Pun, MD;  Location: [facility];  Service: General;  Laterality: Bilateral;  . BREAST BIOPSY Right 12/08/2018   [place] bx of mass with calcs path pending  . BREAST BIOPSY Right 12/08/2018   [place] bx of LN, path pending  . BREAST RECONSTRUCTION WITH PLACEMENT OF TISSUE EXPANDER AND ALLODERM Bilateral 01/14/2019   Procedure: BILATERAL BREAST RECONSTRUCTION WITH PLACEMENT OF TISSUE EXPANDER AND FLEX HD;  Surgeon: Wallace Going, DO;  Location: [facility];  Service: Plastics;  Laterality: Bilateral;  . COLONOSCOPY WITH PROPOFOL N/A 02/17/2015   Procedure: COLONOSCOPY WITH PROPOFOL;  Surgeon: Lucilla Lame, MD;  Location: [place];  Service: Endoscopy;  Laterality: N/A;  . COLONOSCOPY WITH PROPOFOL N/A 07/12/2019   Procedure: COLONOSCOPY WITH BIOPSY;  Surgeon: Lucilla Lame, MD;  Location: [place];  Service: Endoscopy;  Laterality: N/A;  priority 3 PORT A CATH NEEDS ACCESSED  . CYSTOSCOPY N/A 08/05/2019   Procedure: CYSTOSCOPY;  Surgeon: Homero Fellers, MD;  Location: [facility];  Service: Gynecology;  Laterality: N/A;  . LIPOSUCTION WITH LIPOFILLING Bilateral 03/16/2020   Procedure: Bilateral Lipofilling of breasts for symmetry;  Surgeon: Wallace Going, DO;  Location: [place];  Service: Plastics;  Laterality: Bilateral;  1.5 hours  . POLYPECTOMY  02/17/2015   Procedure: POLYPECTOMY;  Surgeon: Lucilla Lame, MD;  Location: [place];  Service: Endoscopy;;  . POLYPECTOMY N/A 07/12/2019   Procedure: POLYPECTOMY;  Surgeon: Lucilla Lame, MD;  Location: [place];  Service: Endoscopy;  Laterality: N/A;  . PORTA CATH REMOVAL Left 10/13/2019   Procedure: PORTA CATH REMOVAL;  Surgeon: Wallace Going, DO;  Location: [place];  Service: Plastics;  Laterality: Left;  . PORTACATH PLACEMENT Left 02/18/2019   Procedure: INSERTION PORT-A-CATH;  Surgeon: Herbert Pun, MD;  Location: [facility];  Service: General;  Laterality:  Left;  . REMOVAL OF BILATERAL TISSUE EXPANDERS WITH PLACEMENT OF BILATERAL BREAST IMPLANTS Bilateral 10/13/2019   Procedure: REMOVAL OF BILATERAL TISSUE EXPANDERS WITH PLACEMENT OF BILATERAL BREAST IMPLANTS;  Surgeon: Wallace Going, DO;  Location: [place];  Service: Plastics;  Laterality: Bilateral;  2 hours please  . SIMPLE MASTECTOMY WITH AXILLARY SENTINEL NODE BIOPSY Left 01/14/2019   Procedure: LEFT SIMPLE MASTECTOMY;  Surgeon: Herbert Pun, MD;  Location: [facility];  Service: General;  Laterality: Left;  . TOTAL LAPAROSCOPIC HYSTERECTOMY WITH BILATERAL SALPINGO OOPHORECTOMY Bilateral 08/05/2019   Procedure: TOTAL LAPAROSCOPIC HYSTERECTOMY WITH BILATERAL SALPINGO OOPHORECTOMY;  Surgeon: Homero Fellers, MD;  Location: [facility];  Service: Gynecology;  Laterality: Bilateral;  . TOTAL MASTECTOMY Right 01/14/2019   Procedure: RIGHT TOTAL MASTECTOMY;  Surgeon: Herbert Pun, MD;  Location: [facility];  Service: General;  Laterality: Right;  . WISDOM TOOTH EXTRACTION      Social History   Tobacco Use  . Smoking status: Current Some Day Smoker    Packs/day: 0.05    Years: 20.00    Pack years: 1.00    Types: Cigarettes    Last attempt to quit: 01/07/2019    Years since quitting: 1.3  . Smokeless tobacco: Never Used  . Tobacco comment: 4 cigs a day.  Vaping Use  . Vaping Use: Never used  Substance Use Topics  . Alcohol use: Yes    Alcohol/week: 2.0 standard drinks    Types: 2 Standard drinks or equivalent per week    Comment: occasional  . Drug use: No     Medication list has been reviewed and updated.  Current Meds  Medication Sig  . acetaminophen (TYLENOL) 500 MG tablet Take 2 tablets (1,000 mg total) by mouth every 6 (six) hours as needed for mild pain.  Marland Kitchen anastrozole (ARIMIDEX) 1 MG tablet Take 1 tablet (1 mg total) by mouth daily.  Marland Kitchen CALCIUM PO Take by mouth daily.  . cetirizine (ZYRTEC) 10 MG tablet Take 10 mg by mouth daily.  .  Cholecalciferol (VITAMIN D3 PO) Take by mouth daily.  . DULoxetine (CYMBALTA) 30 MG capsule Take 1 capsule (30 mg total) by mouth daily.  Marland Kitchen lisinopril-hydrochlorothiazide (ZESTORETIC) 20-25 MG tablet Take 1 tablet by mouth once daily  . multivitamin-iron-minerals-folic acid (CENTRUM) chewable tablet Chew 1 tablet by mouth daily.  Marland Kitchen omeprazole (PRILOSEC) 40 MG capsule Take 1 capsule by mouth once daily  . ondansetron (ZOFRAN) 4 MG tablet Take 1 tablet (4 mg total) by mouth every 8 (eight) hours as needed for nausea or vomiting.    PHQ 2/9 Scores 05/31/2020 11/25/2019 06/01/2019 11/23/2018  PHQ - 2 Score 1 0 4 0  PHQ- 9 Score 5 1 10  0    GAD 7 : Generalized Anxiety Score 05/31/2020 11/25/2019 06/01/2019  Nervous, Anxious, on Edge 0 1 0  Control/stop worrying 0 0 0  Worry too much - different things 0 0 0  Trouble relaxing 0 0 0  Restless 0 0 0  Easily annoyed or irritable 0 0 0  Afraid - awful might happen 0 0 0  Total GAD 7 Score 0 1 0  Anxiety Difficulty - Not difficult at all Not difficult at all    BP Readings from Last 3 Encounters:  05/31/20 (!) 140/92  05/29/20 (!) 149/93  05/15/20 (!) 143/90    Physical Exam Vitals and nursing note reviewed.  Constitutional:      General: She is not in acute distress.    Appearance: Normal appearance. She is well-developed.  HENT:     Head: Normocephalic and atraumatic.     Right Ear: Tympanic membrane and ear canal normal.     Left Ear: Tympanic membrane and ear canal normal.     Nose:     Right Sinus: No maxillary sinus tenderness or frontal sinus tenderness.     Left Sinus: No maxillary sinus tenderness or frontal sinus tenderness.  Cardiovascular:     Rate and Rhythm: Normal rate and regular rhythm.     Pulses: Normal pulses.     Heart sounds: No murmur heard.   Pulmonary:     Effort: Pulmonary effort is normal. No respiratory distress.     Breath sounds: No wheezing or rhonchi.  Musculoskeletal:     Cervical back: Normal range  of motion.     Right lower leg: No edema.     Left lower leg: No edema.  Lymphadenopathy:     Cervical: No cervical adenopathy.  Skin:    General: Skin is warm and dry.     Findings: No rash.  Neurological:     Mental Status: She is alert and oriented to person, place, and time.  Psychiatric:        Mood and Affect: Mood normal.  Behavior: Behavior normal.     Wt Readings from Last 3 Encounters:  05/31/20 157 lb (71.2 kg)  05/29/20 155 lb 3.2 oz (70.4 kg)  05/15/20 153 lb (69.4 kg)    BP (!) 140/92   Pulse 77   Temp 97.9 F (36.6 C) (Oral)   Ht 5' 4"  (1.626 m)   Wt 157 lb (71.2 kg)   SpO2 96%   BMI 26.95 kg/m   Assessment and Plan: 1. Essential hypertension Borderline elevation recently - could be from ongoing sinus congestion s/p COVID infection Continue current medication Check BP several times per week at home and send readings after several weeks Would increase to lisinopril 40 mg/hctz 25 mg  2. Nasal sinus congestion No evidence of bacterial infection - most likely mild residual from COVID infection Recommend Flonase NS  3. Malignant neoplasm of lower-outer quadrant of right breast of female, estrogen receptor positive ([place]) She has completed her reconstruction and is now finishing up with nipple tattoos and shading.   Partially dictated using Editor, commissioning. Any errors are unintentional.  Halina Maidens, MD [facility]  05/31/2020

## 2020-06-06 ENCOUNTER — Ambulatory Visit: Payer: BC Managed Care – PPO | Admitting: Oncology

## 2020-06-06 ENCOUNTER — Other Ambulatory Visit: Payer: BC Managed Care – PPO

## 2020-06-12 ENCOUNTER — Encounter: Payer: Self-pay | Admitting: Internal Medicine

## 2020-07-10 ENCOUNTER — Ambulatory Visit (INDEPENDENT_AMBULATORY_CARE_PROVIDER_SITE_OTHER): Payer: BC Managed Care – PPO

## 2020-07-10 ENCOUNTER — Other Ambulatory Visit: Payer: Self-pay

## 2020-07-10 VITALS — BP 136/85 | HR 73 | Temp 97.9°F | Ht 64.0 in | Wt 155.0 lb

## 2020-07-10 DIAGNOSIS — Z9013 Acquired absence of bilateral breasts and nipples: Secondary | ICD-10-CM | POA: Diagnosis not present

## 2020-07-10 DIAGNOSIS — Z719 Counseling, unspecified: Secondary | ICD-10-CM

## 2020-07-11 NOTE — Progress Notes (Signed)
NIPPLE AREOLAR TATTOO PROCEDURE  PREOPERATIVE DIAGNOSIS:  Acquired absence of (BILATERAL) nipple areolar   POSTOPERATIVE DIAGNOSIS: Acquired absence of (BILATERAL})nipple areolar    PROCEDURES: (BILATERAL) nipple areolar tattoo touch-up  ATTENDING SURGEON: Dr. Lyndee Leo Dillingham   ANESTHESIA:  EMLA 30 mins prior to procedure  COMPLICATIONS: None.  JUSTIFICATION FOR PROCEDURE:  Ms. Majid is a 55 y.o. female with a history of breast cancer status post bilateral breast reconstruction. The patient presents for nipple areolar complex tattoo touch-up.  Risks, benefits, indications, and alternatives of the above described procedures were discussed with the patient and all the patient's questions were answered.   DESCRIPTION OF PROCEDURE: After informed consent was obtained and proper identification of patient and surgical site was made, the patient was taken to the procedure room and pre-procedure photos were taken & entered into chart. Pt was placed supine on the operating room table. A time out was performed to confirm patient's identity and surgical site. The patient was prepped and draped in the usual sterile fashion.   Using a # 7  tattoo head, pigment was instilled to the designed nipple areolar complexes.   Once adequate pigment had been applied to the nipple areolar complex, a post-procedure photo taken vaseline/ xeroform / gauze dressing was applied. The patient tolerated the procedure well.   World Famous Tattoo Ink used: Josph Macho Skin/ [place]/ Tan Peach/ Portrait White/ Fair Peach & Duration used as needed for anesthesia Lot #'s & expiration dates on file

## 2020-07-11 NOTE — Patient Instructions (Signed)
Aftercare instructions were reviewed as follows: Use vaseline/xeroform/gauze for approx 1 week/ or as needed. Call for any concerns She is aware that she may need further touchup if needed.

## 2020-07-28 ENCOUNTER — Other Ambulatory Visit: Payer: Self-pay | Admitting: Internal Medicine

## 2020-07-28 DIAGNOSIS — I10 Essential (primary) hypertension: Secondary | ICD-10-CM

## 2020-08-17 ENCOUNTER — Other Ambulatory Visit: Payer: Self-pay | Admitting: Internal Medicine

## 2020-08-17 DIAGNOSIS — K219 Gastro-esophageal reflux disease without esophagitis: Secondary | ICD-10-CM

## 2020-08-17 NOTE — Telephone Encounter (Signed)
Requested Prescriptions  Pending Prescriptions Disp Refills  . omeprazole (PRILOSEC) 40 MG capsule [Pharmacy Med Name: Omeprazole 40 MG Oral Capsule Delayed Release] 90 capsule 0    Sig: Take 1 capsule by mouth once daily     Gastroenterology: Proton Pump Inhibitors Passed - 08/17/2020  3:18 PM      Passed - Valid encounter within last 12 months    Recent Outpatient Visits          2 months ago Essential hypertension   [facility] Glean Hess, MD   8 months ago Annual physical exam   [facility] Glean Hess, MD   1 year ago Essential hypertension   [facility] Glean Hess, MD   1 year ago Annual physical exam   [facility] Glean Hess, MD   2 years ago Annual physical exam   [facility] Glean Hess, MD      Future Appointments            In 3 months Army Melia Jesse Sans, MD [facility], [facility]

## 2020-08-23 ENCOUNTER — Other Ambulatory Visit: Payer: Self-pay

## 2020-08-24 ENCOUNTER — Inpatient Hospital Stay: Payer: BC Managed Care – PPO

## 2020-08-24 ENCOUNTER — Other Ambulatory Visit: Payer: Self-pay | Admitting: Oncology

## 2020-08-24 ENCOUNTER — Inpatient Hospital Stay (HOSPITAL_BASED_OUTPATIENT_CLINIC_OR_DEPARTMENT_OTHER): Payer: BC Managed Care – PPO | Admitting: Oncology

## 2020-08-24 ENCOUNTER — Encounter: Payer: Self-pay | Admitting: Oncology

## 2020-08-24 ENCOUNTER — Other Ambulatory Visit: Payer: Self-pay

## 2020-08-24 ENCOUNTER — Inpatient Hospital Stay: Payer: BC Managed Care – PPO | Attending: Oncology

## 2020-08-24 VITALS — BP 139/92 | HR 83 | Temp 98.4°F | Resp 18 | Wt 162.3 lb

## 2020-08-24 DIAGNOSIS — C50511 Malignant neoplasm of lower-outer quadrant of right female breast: Secondary | ICD-10-CM

## 2020-08-24 DIAGNOSIS — Z800 Family history of malignant neoplasm of digestive organs: Secondary | ICD-10-CM | POA: Diagnosis not present

## 2020-08-24 DIAGNOSIS — Z823 Family history of stroke: Secondary | ICD-10-CM | POA: Diagnosis not present

## 2020-08-24 DIAGNOSIS — Z1502 Genetic susceptibility to malignant neoplasm of ovary: Secondary | ICD-10-CM

## 2020-08-24 DIAGNOSIS — Z87891 Personal history of nicotine dependence: Secondary | ICD-10-CM | POA: Diagnosis not present

## 2020-08-24 DIAGNOSIS — Z79811 Long term (current) use of aromatase inhibitors: Secondary | ICD-10-CM | POA: Insufficient documentation

## 2020-08-24 DIAGNOSIS — Z801 Family history of malignant neoplasm of trachea, bronchus and lung: Secondary | ICD-10-CM | POA: Insufficient documentation

## 2020-08-24 DIAGNOSIS — Z90710 Acquired absence of both cervix and uterus: Secondary | ICD-10-CM | POA: Insufficient documentation

## 2020-08-24 DIAGNOSIS — Z1509 Genetic susceptibility to other malignant neoplasm: Secondary | ICD-10-CM

## 2020-08-24 DIAGNOSIS — Z90722 Acquired absence of ovaries, bilateral: Secondary | ICD-10-CM | POA: Diagnosis not present

## 2020-08-24 DIAGNOSIS — Z8249 Family history of ischemic heart disease and other diseases of the circulatory system: Secondary | ICD-10-CM | POA: Diagnosis not present

## 2020-08-24 DIAGNOSIS — Z803 Family history of malignant neoplasm of breast: Secondary | ICD-10-CM | POA: Diagnosis not present

## 2020-08-24 DIAGNOSIS — Z170 Estrogen receptor positive status [ER+]: Secondary | ICD-10-CM

## 2020-08-24 DIAGNOSIS — Z86010 Personal history of colonic polyps: Secondary | ICD-10-CM | POA: Diagnosis not present

## 2020-08-24 DIAGNOSIS — Z818 Family history of other mental and behavioral disorders: Secondary | ICD-10-CM | POA: Diagnosis not present

## 2020-08-24 DIAGNOSIS — Z1501 Genetic susceptibility to malignant neoplasm of breast: Secondary | ICD-10-CM

## 2020-08-24 DIAGNOSIS — E871 Hypo-osmolality and hyponatremia: Secondary | ICD-10-CM

## 2020-08-24 DIAGNOSIS — G629 Polyneuropathy, unspecified: Secondary | ICD-10-CM | POA: Insufficient documentation

## 2020-08-24 DIAGNOSIS — M810 Age-related osteoporosis without current pathological fracture: Secondary | ICD-10-CM | POA: Diagnosis not present

## 2020-08-24 DIAGNOSIS — Z79899 Other long term (current) drug therapy: Secondary | ICD-10-CM | POA: Diagnosis not present

## 2020-08-24 DIAGNOSIS — E876 Hypokalemia: Secondary | ICD-10-CM

## 2020-08-24 LAB — COMPREHENSIVE METABOLIC PANEL
ALT: 22 U/L (ref 0–44)
AST: 28 U/L (ref 15–41)
Albumin: 4.4 g/dL (ref 3.5–5.0)
Alkaline Phosphatase: 70 U/L (ref 38–126)
Anion gap: 9 (ref 5–15)
BUN: 14 mg/dL (ref 6–20)
CO2: 26 mmol/L (ref 22–32)
Calcium: 9.4 mg/dL (ref 8.9–10.3)
Chloride: 100 mmol/L (ref 98–111)
Creatinine, Ser: 0.71 mg/dL (ref 0.44–1.00)
GFR, Estimated: >60 mL/min (ref >60)
Glucose, Bld: 148 mg/dL — ABNORMAL HIGH (ref 70–99)
Potassium: 3.4 mmol/L — ABNORMAL LOW (ref 3.5–5.1)
Sodium: 135 mmol/L (ref 135–145)
Total Bilirubin: 0.8 mg/dL (ref 0.3–1.2)
Total Protein: 7.4 g/dL (ref 6.5–8.1)

## 2020-08-24 LAB — CBC WITH DIFFERENTIAL/PLATELET
Abs Immature Granulocytes: 0.03 10*3/uL (ref 0.00–0.07)
Basophils Absolute: 0.1 10*3/uL (ref 0.0–0.1)
Basophils Relative: 1 %
Eosinophils Absolute: 0.2 10*3/uL (ref 0.0–0.5)
Eosinophils Relative: 3 %
HCT: 39 % (ref 36.0–46.0)
Hemoglobin: 13.3 g/dL (ref 12.0–15.0)
Immature Granulocytes: 0 %
Lymphocytes Relative: 37 %
Lymphs Abs: 3.2 10*3/uL (ref 0.7–4.0)
MCH: 31.5 pg (ref 26.0–34.0)
MCHC: 34.1 g/dL (ref 30.0–36.0)
MCV: 92.4 fL (ref 80.0–100.0)
Monocytes Absolute: 0.5 10*3/uL (ref 0.1–1.0)
Monocytes Relative: 5 %
Neutro Abs: 4.6 10*3/uL (ref 1.7–7.7)
Neutrophils Relative %: 54 %
Platelets: 292 10*3/uL (ref 150–400)
RBC: 4.22 MIL/uL (ref 3.87–5.11)
RDW: 13.3 % (ref 11.5–15.5)
WBC: 8.6 10*3/uL (ref 4.0–10.5)
nRBC: 0 % (ref 0.0–0.2)

## 2020-08-24 MED ORDER — ANASTROZOLE 1 MG PO TABS
1.0000 mg | ORAL_TABLET | Freq: Every day | ORAL | 1 refills | Status: DC
Start: 2020-08-24 — End: 2021-03-05

## 2020-08-24 MED ORDER — ZOLEDRONIC ACID 4 MG/100ML IV SOLN
4.0000 mg | Freq: Once | INTRAVENOUS | Status: AC
  Administered 2020-08-24: 4 mg via INTRAVENOUS
  Filled 2020-08-24: qty 100

## 2020-08-24 MED ORDER — DULOXETINE HCL 60 MG PO CPEP: 60.0000 mg | ORAL_CAPSULE | Freq: Every day | ORAL | 1 refills | Status: DC

## 2020-08-24 MED ORDER — SODIUM CHLORIDE 0.9 % IV SOLN
Freq: Once | INTRAVENOUS | Status: AC
  Filled 2020-08-24: qty 250

## 2020-08-24 NOTE — Progress Notes (Signed)
Hematology/Oncology follow up  note [facility] Telephone:([phone]) [phone] Fax:(336) [phone]   Patient Care Team: Glean Hess, MD as PCP - General (Internal Medicine) Jannet Mantis, MD (Dermatology) Rico Junker, RN as Registered Nurse Herbert Pun, MD as Consulting Physician (General Surgery) Earlie Server, MD as Consulting Physician (Oncology) Dillingham, Loel Lofty, DO as Attending Physician (Plastic Surgery)  CHIEF COMPLAINTS/REASON FOR VISIT:  Follow-up for breast cancer.  HISTORY OF PRESENTING ILLNESS:  Cheryl Mooney is a  55 y.o.  female with PMH listed below who was referred to me for evaluation of breast cancer Patient reports feeling mass of her right breast week before her annual mammogram. 12/03/2018 patient had bilateral diagnostic mammogram and ultrasound  which showed suspicious 2.1 x 1.3 x 1.6 irregular mass at 7:00 in the right breast, 2 cm from the nipple. 2 borderline lymph nodes are seen in the right axilla.  1 of the nodes demonstrate a cortex of 4.4 mm.  Both lymph nodes demonstrated retention of fatty hila. Patient underwent ultrasound-guided biopsy of right breast mass as well as ultrasound-guided biopsy of 1 of the 2 mildly abnormal right axillary lymph nodes. 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 Right axilla lymph node negative for malignancy. ER> 90% positive, PR11-50% positive, HER-2 equivocal, 2+ by IHC.  FISH negative.   Nipple discharge: Denies Family history: Mother diagnosed with breast cancer at age of 22, and colon cancer at age of 4.  Mother has BRCA2 mutation.   Maternal grandmother breast cancer, maternal great aunt breast cancer.  Patient recalls that she was tested long time ago and was not aware of any results.  OCP use: In her 20s-30s Estrogen and progesterone therapy: denies History of radiation to chest: denies.   Previous breast surgery: Denies  BRCA 2 positive.  # 01/14/2019 patient underwent bilateral mastectomy with sentinel lymph node biopsy of left and the right axillary. Right breast showed invasive mammary carcinoma, DCIS positive, apocrine metaplastic, stromal fibrosis, duct ectasia, simple cyst formation, usual epithelial hyperplasia.  Sentinel lymph node on the right side was negative. pT2 pN0 Left prophylactic mastectomy and a sentinel lymph node negative for malignancy.  #OncotypeDX recurrence score 31, adjuvant chemotherapy benefit more than 15%. # She follows up with plastic surgeon Dr. Marla Roe, she has expander, expanding process is being during chemotherapy. Mediport was placed by Dr. Peyton Najjar on 02/18/2019.  Patient had 1 cycle of AC, chemotherapy plan was switched to docetaxel and carboplatin after consulting expert opinion. She has finished 4 cycle of docetaxel and carboplatin. May 2021, patient underwent laparoscopic-assisted vaginal hysterectomy with bilateralsalpingectomy and oophorectomy 10/13/19 bilateral breast reconstruction  Porta cath removal.  03/16/20 treatment for bilateral breast asymmetry treatment.   INTERVAL HISTORY Cheryl Mooney is a 55 y.o. female who has above history reviewed by me today presents for follow up visit for management of stage I breast cancer, ER PR positive, HER-2 negative, hereditary BRCA 2 mutation.  Problems and complaints are listed below: Patient takes Arimidex daily.  She has manageable side effects. Patient has tried Cymbalta 30 mg daily and neuropathy has gotten worse.  Wants to go back to 60 mg daily.   Denies any breast concerns.  Review of Systems  Constitutional: Negative for appetite change, chills and fever.  HENT:   Negative for hearing loss and voice change.   Eyes: Negative for eye problems.  Respiratory: Negative for chest tightness and cough.   Cardiovascular: Negative  for chest pain.  Gastrointestinal: Negative for  abdominal distention, abdominal pain, blood in stool and diarrhea.  Endocrine: Positive for hot flashes.  Genitourinary: Negative for difficulty urinating and frequency.   Skin: Negative for itching and rash.  Neurological: Negative for extremity weakness.  Hematological: Negative for adenopathy.  Psychiatric/Behavioral: Negative for confusion. The patient is not nervous/anxious.     MEDICAL HISTORY:  Past Medical History:  Diagnosis Date  . Anemia    things seem back to normal  . Benign neoplasm of sigmoid colon   . Benign neoplasm of transverse colon   . Breast cancer ([place])   . Depression   . Encounter for antineoplastic chemotherapy 12/30/2018   Pathogenic variant in BRCA2 called [id number] identified on the [employer] Breast Cancer STAT Panel. The STAT Breast cancer panel offered by [facility] includes sequencing and rearrangement analysis for the following 9 genes:  ATM, BRCA1, BRCA2, CDH1, CHEK2, PALB2, PTEN, STK11 and TP53.  The report date is 12/30/2018. Common Hereditary Cancer Panel results pending.   . Family history of BRCA2 gene positive   . Family history of breast cancer   . Family history of colon cancer   . Family history of lung cancer   . Family history of pancreatic cancer   . GERD (gastroesophageal reflux disease)   . History of kidney stones   . Hypertension   . Malignant neoplasm of lower-outer quadrant of right breast of female, estrogen receptor positive ([place]) 12/17/2018  . Smoker     SURGICAL HISTORY: Past Surgical History:  Procedure Laterality Date  . ABDOMINAL HYSTERECTOMY    . AXILLARY SENTINEL NODE BIOPSY Bilateral 01/14/2019   Procedure: AXILLARY SENTINEL NODE BIOPSY;  Surgeon: Herbert Pun, MD;  Location: [facility];  Service: General;  Laterality: Bilateral;  . BREAST BIOPSY Right 12/08/2018   [place] bx of mass with calcs path pending  . BREAST BIOPSY Right 12/08/2018   [place] bx of LN, path pending  . BREAST RECONSTRUCTION WITH PLACEMENT OF TISSUE  EXPANDER AND ALLODERM Bilateral 01/14/2019   Procedure: BILATERAL BREAST RECONSTRUCTION WITH PLACEMENT OF TISSUE EXPANDER AND FLEX HD;  Surgeon: Wallace Going, DO;  Location: [facility];  Service: Plastics;  Laterality: Bilateral;  . COLONOSCOPY WITH PROPOFOL N/A 02/17/2015   Procedure: COLONOSCOPY WITH PROPOFOL;  Surgeon: Lucilla Lame, MD;  Location: [place];  Service: Endoscopy;  Laterality: N/A;  . COLONOSCOPY WITH PROPOFOL N/A 07/12/2019   Procedure: COLONOSCOPY WITH BIOPSY;  Surgeon: Lucilla Lame, MD;  Location: [place];  Service: Endoscopy;  Laterality: N/A;  priority 3 PORT A CATH NEEDS ACCESSED  . CYSTOSCOPY N/A 08/05/2019   Procedure: CYSTOSCOPY;  Surgeon: Homero Fellers, MD;  Location: [facility];  Service: Gynecology;  Laterality: N/A;  . LIPOSUCTION WITH LIPOFILLING Bilateral 03/16/2020   Procedure: Bilateral Lipofilling of breasts for symmetry;  Surgeon: Wallace Going, DO;  Location: [place];  Service: Plastics;  Laterality: Bilateral;  1.5 hours  . POLYPECTOMY  02/17/2015   Procedure: POLYPECTOMY;  Surgeon: Lucilla Lame, MD;  Location: [place];  Service: Endoscopy;;  . POLYPECTOMY N/A 07/12/2019   Procedure: POLYPECTOMY;  Surgeon: Lucilla Lame, MD;  Location: [place];  Service: Endoscopy;  Laterality: N/A;  . PORTA CATH REMOVAL Left 10/13/2019   Procedure: PORTA CATH REMOVAL;  Surgeon: Wallace Going, DO;  Location: [place];  Service: Plastics;  Laterality: Left;  . PORTACATH PLACEMENT Left 02/18/2019   Procedure: INSERTION PORT-A-CATH;  Surgeon: Herbert Pun, MD;  Location: [facility];  Service: General;  Laterality: Left;  . REMOVAL OF BILATERAL TISSUE EXPANDERS WITH PLACEMENT OF BILATERAL BREAST IMPLANTS Bilateral 10/13/2019   Procedure: REMOVAL OF BILATERAL TISSUE EXPANDERS WITH PLACEMENT OF BILATERAL BREAST IMPLANTS;  Surgeon: Wallace Going, DO;  Location: [employer];  Service: Plastics;  Laterality: Bilateral;  2 hours please  . SIMPLE MASTECTOMY WITH AXILLARY SENTINEL NODE BIOPSY Left 01/14/2019   Procedure: LEFT SIMPLE MASTECTOMY;  Surgeon: Herbert Pun, MD;  Location: [facility];  Service: General;  Laterality: Left;  . TOTAL LAPAROSCOPIC HYSTERECTOMY WITH BILATERAL SALPINGO OOPHORECTOMY Bilateral 08/05/2019   Procedure: TOTAL LAPAROSCOPIC HYSTERECTOMY WITH BILATERAL SALPINGO OOPHORECTOMY;  Surgeon: Homero Fellers, MD;  Location: [facility];  Service: Gynecology;  Laterality: Bilateral;  . TOTAL MASTECTOMY Right 01/14/2019   Procedure: RIGHT TOTAL MASTECTOMY;  Surgeon: Herbert Pun, MD;  Location: [facility];  Service: General;  Laterality: Right;  . WISDOM TOOTH EXTRACTION      SOCIAL HISTORY: Social History   Socioeconomic History  . Marital status: Married    Spouse name: Jenny Reichmann  . Number of children: Not on file  . Years of education: Not on file  . Highest education level: Not on file  Occupational History  . Occupation: Physicist, medical    Comment: retired  Tobacco Use  . Smoking status: Former Smoker    Packs/day: 0.05    Years: 20.00    Pack years: 1.00    Types: Cigarettes    Quit date: 01/07/2019    Years since quitting: 1.6  . Smokeless tobacco: Never Used  . Tobacco comment: 4 cigs a day.   Vaping Use  . Vaping Use: Never used  Substance and Sexual Activity  . Alcohol use: Yes    Alcohol/week: 2.0 standard drinks    Types: 2 Standard drinks or equivalent per week    Comment: occasional  . Drug use: No  . Sexual activity: Yes    Birth control/protection: Surgical  Other Topics Concern  . Not on file  Social History Narrative   Patient lives with husband.   Social Determinants of Health   Financial Resource Strain: Not on file  Food Insecurity: Not on file  Transportation Needs: Not on file  Physical Activity: Not on file  Stress: Not on file  Social Connections: Not on file  Intimate  Partner Violence: Not on file  She is a retired Pharmacist, hospital  FAMILY HISTORY: Family History  Problem Relation Age of Onset  . Colon cancer Mother 74  . Breast cancer Mother 20       "BRCA2+"  . Hypertension Father   . Stroke Father   . Alzheimer's disease Father   . Prostate cancer Father   . Breast cancer Maternal Aunt        mat great aunt  . Breast cancer Maternal Grandmother 25  . Pancreatic cancer Maternal Grandfather 60    ALLERGIES:  has No Known Allergies.  MEDICATIONS:  Current Outpatient Medications  Medication Sig Dispense Refill  . acetaminophen (TYLENOL) 500 MG tablet Take 2 tablets (1,000 mg total) by mouth every 6 (six) hours as needed for mild pain. 30 tablet 0  . anastrozole (ARIMIDEX) 1 MG tablet Take 1 tablet (1 mg total) by mouth daily. 90 tablet 1  . CALCIUM PO Take by mouth daily.    . cetirizine (ZYRTEC) 10 MG tablet Take 10 mg by mouth daily.    . Cholecalciferol (VITAMIN D3 PO) Take by mouth daily.    Marland Kitchen  DULoxetine (CYMBALTA) 30 MG capsule Take 1 capsule (30 mg total) by mouth daily. 90 capsule 1  . lisinopril-hydrochlorothiazide (ZESTORETIC) 20-25 MG tablet Take 1 tablet by mouth once daily 90 tablet 0  . multivitamin-iron-minerals-folic acid (CENTRUM) chewable tablet Chew 1 tablet by mouth daily.    Marland Kitchen omeprazole (PRILOSEC) 40 MG capsule Take 1 capsule by mouth once daily 90 capsule 0  . ondansetron (ZOFRAN) 4 MG tablet Take 1 tablet (4 mg total) by mouth every 8 (eight) hours as needed for nausea or vomiting. 20 tablet 0   No current facility-administered medications for this visit.     PHYSICAL EXAMINATION: ECOG PERFORMANCE STATUS: 0 - Asymptomatic Vitals:   08/24/20 1331  BP: (!) 139/92  Pulse: 83  Resp: 18  Temp: 98.4 F (36.9 C)  SpO2: 98%   Filed Weights   08/24/20 1331  Weight: 162 lb 4.1 oz (73.6 kg)    Physical Exam Constitutional:      General: She is not in acute distress. HENT:     Head: Normocephalic and atraumatic.  Eyes:      General: No scleral icterus.    Pupils: Pupils are equal, round, and reactive to light.  Cardiovascular:     Rate and Rhythm: Normal rate and regular rhythm.     Heart sounds: Normal heart sounds.  Pulmonary:     Effort: Pulmonary effort is normal. No respiratory distress.     Breath sounds: No wheezing.  Abdominal:     General: Bowel sounds are normal. There is no distension.     Palpations: Abdomen is soft. There is no mass.     Tenderness: There is no abdominal tenderness.  Musculoskeletal:        General: No deformity. Normal range of motion.     Cervical back: Normal range of motion and neck supple.  Skin:    General: Skin is warm and dry.     Findings: No erythema or rash.  Neurological:     Mental Status: She is alert and oriented to person, place, and time. Mental status is at baseline.     Cranial Nerves: No cranial nerve deficit.     Coordination: Coordination normal.  Psychiatric:        Mood and Affect: Mood normal.   Bilateral mastectomy with implant Breast exam is performed in seated and lying down position. Patient is status post bilateral mastectomy with implants.  The edges are intact and there is no evidence of any chest wall recurrence.  No palpable bilateral axillary adenopathy   LABORATORY DATA:  I have reviewed the data as listed Lab Results  Component Value Date   WBC 8.6 08/24/2020   HGB 13.3 08/24/2020   HCT 39.0 08/24/2020   MCV 92.4 08/24/2020   PLT 292 08/24/2020   Recent Labs    10/06/19 1308 10/06/19 1308 02/07/20 0955 02/24/20 1350 03/13/20 1236 05/29/20 1258  NA 135  --  136 134* 132* 135  K 3.5  --  3.9 3.7 3.8 3.6  CL 100  --  101 96* 98 97*  CO2 24  --  24 25 20* 25  GLUCOSE 147*  --  128* 155* 219* 134*  BUN 8  --  _0 CREATININE 0.61  --  0.73 0.71 0.75 0.67  CALCIUM 9.2  --  9.4 9.7 9.3 9.3  GFRNONAA >60   < > >60 >60 >60 >60  GFRAA >60  --   --   --   --   --  PROT 7.6  --  7.1  --   --  7.5  ALBUMIN 4.3  --   4.1  --   --  4.5  AST 19  --  19  --   --  22  ALT 13  --  13  --   --  17  ALKPHOS 82  --  69  --   --  78  BILITOT 0.7  --  0.6  --   --  0.7   < > = values in this interval not displayed.   Iron/TIBC/Ferritin/ %Sat No results found for: IRON, TIBC, FERRITIN, IRONPCTSAT   RADIOGRAPHIC STUDIES: I have personally reviewed the radiological images as listed and agreed with the findings in the report. No results found.    ASSESSMENT & PLAN:  1. BRCA2 gene mutation positive in female   2. Aromatase inhibitor use   3. Malignant neoplasm of lower-outer quadrant of right breast of female, estrogen receptor positive ([place])   4. Hypokalemia    Stage IB pT2 pN0 M0 ER + PR+ HER2 negative right breast cancer, grade 3, +LVI,  Bilateral mastectomy and reconstruction [01/14/2019]  -BRCA2 positive Patient tolerates Arimidex 1 mg daily.  Continue current regimen.  Refill sent to pharmacy.   # Osteoporosis. Continue calcium and vitamin D supplementation.  Zometa every 6 months-proceed with Zometa today..  Patient prefers to stay on aromatase inhibitor rather than switch to tamoxifen. Recommend exercise as tolerated.  #Mild hypokalemia, recommend patient to increase potassium rich food intake. #BRCA2 gene mutation positive,status post hysterectomy with bilateral salpingo-oophorectomy. Annual dermatology evaluation. She has a family history of pancreatic cancer.  03/01/20 MRI abdomen w/wo no evidence of pancreatic mass. hemangioma in left hepatic lobe.   All questions were answered. The patient knows to call the clinic with any problems questions or concerns. Follow-up in 6 months.   Earlie Server, MD, PhD Hematology Oncology [facility] at [facility] Pager- [phone] 08/24/2020

## 2020-08-24 NOTE — Progress Notes (Signed)
Patient here for follow up. Pt reports that she has has increased her Cymbalta back to 60 mg because her neuropathy to hands was coming back.

## 2020-08-25 ENCOUNTER — Ambulatory Visit: Payer: BC Managed Care – PPO | Admitting: Oncology

## 2020-08-25 ENCOUNTER — Other Ambulatory Visit: Payer: BC Managed Care – PPO

## 2020-08-25 ENCOUNTER — Ambulatory Visit: Payer: BC Managed Care – PPO

## 2020-08-31 ENCOUNTER — Other Ambulatory Visit: Payer: Self-pay

## 2020-08-31 ENCOUNTER — Ambulatory Visit (INDEPENDENT_AMBULATORY_CARE_PROVIDER_SITE_OTHER): Payer: BC Managed Care – PPO

## 2020-08-31 VITALS — BP 143/88 | HR 77

## 2020-08-31 DIAGNOSIS — Z9013 Acquired absence of bilateral breasts and nipples: Secondary | ICD-10-CM | POA: Diagnosis not present

## 2020-09-06 NOTE — Patient Instructions (Signed)
Aftercare instructions reviewed- to use vaseline/xeroform/gauze dressing Call for any concerns

## 2020-09-06 NOTE — Progress Notes (Signed)
NIPPLE AREOLAR TATTOO PROCEDURE  PREOPERATIVE DIAGNOSIS:  Acquired absence of BILATERAL nipple areolar   POSTOPERATIVE DIAGNOSIS: Acquired absence of BILATERAL nipple areolar    PROCEDURES: BILATERAL nipple areolar tattoo/touch-up  ATTENDING SURGEON: Dr. Claudia Desanctis   ANESTHESIA:  EMLA  COMPLICATIONS: None.  JUSTIFICATION FOR PROCEDURE:  Ms. Robart is a 55 y.o. female with a history of breast cancer status post breast reconstruction. The patient presents for nipple areolar complex tattoo. Risks, benefits, indications, and alternatives of the above described procedures were discussed with the patient and all the patient's questions were answered.   DESCRIPTION OF PROCEDURE: After informed consent was obtained and proper identification of patient and surgical site was made, the patient was taken to the procedure room and pre-procedure photos taken & entered into chart. Patient was placed supine on the operating room table. A time out was performed to confirm patient's identity and surgical site. The patient was prepped and draped in the usual sterile fashion. . Using a #7 tattoo head, pigment was instilled to the designed nipple areolar complexes. . Once adequate pigment had been applied to the nipple areolar complex, a post-procedure photo taken. vaseline / xeroform & gauze dressing was applied. The patient tolerated the procedure well.   World Famous Tattoo Ink used: Bright Peach / King [place] /  Warm Honey / Fair Peach Duration

## 2020-10-26 ENCOUNTER — Other Ambulatory Visit: Payer: Self-pay | Admitting: Internal Medicine

## 2020-10-26 DIAGNOSIS — I10 Essential (primary) hypertension: Secondary | ICD-10-CM

## 2020-10-26 NOTE — Telephone Encounter (Signed)
Requested Prescriptions  Pending Prescriptions Disp Refills  . lisinopril-hydrochlorothiazide (ZESTORETIC) 20-25 MG tablet [Pharmacy Med Name: Lisinopril-hydroCHLOROthiazide 20-25 MG Oral Tablet] 90 tablet 0    Sig: Take 1 tablet by mouth once daily     Cardiovascular:  ACEI + Diuretic Combos Failed - 10/26/2020  5:30 AM      Failed - K in normal range and within 180 days    Potassium  Date Value Ref Range Status  08/24/2020 3.4 (L) 3.5 - 5.1 mmol/L Final         Failed - Last BP in normal range    BP Readings from Last 1 Encounters:  08/31/20 (!) 143/88         Passed - Na in normal range and within 180 days    Sodium  Date Value Ref Range Status  08/24/2020 135 135 - 145 mmol/L Final  11/23/2018 139 134 - 144 mmol/L Final         Passed - Cr in normal range and within 180 days    Creatinine, Ser  Date Value Ref Range Status  08/24/2020 0.71 0.44 - 1.00 mg/dL Final         Passed - Ca in normal range and within 180 days    Calcium  Date Value Ref Range Status  08/24/2020 9.4 8.9 - 10.3 mg/dL Final         Passed - Patient is not pregnant      Passed - Valid encounter within last 6 months    Recent Outpatient Visits          4 months ago Essential hypertension   [facility] Glean Hess, MD   11 months ago Annual physical exam   [facility] Glean Hess, MD   1 year ago Essential hypertension   [place], Laura H, MD   1 year ago Annual physical exam   [facility] Glean Hess, MD   2 years ago Annual physical exam   [facility] Glean Hess, MD      Future Appointments            In 1 month Army Melia, Jesse Sans, MD [facility]

## 2020-11-29 ENCOUNTER — Encounter: Payer: BC Managed Care – PPO | Admitting: Internal Medicine

## 2020-11-29 NOTE — Progress Notes (Deleted)
Date:  11/29/2020   Name:  Cheryl Mooney   DOB:  [birth year]   MRN:  [id number]   Chief Complaint: No chief complaint on file. Cheryl Mooney is a 55 y.o. female who presents today for her Complete Annual Exam. She feels {DESC; WELL/FAIRLY WELL/POORLY:18703}. She reports exercising ***. She reports she is sleeping {DESC; WELL/FAIRLY WELL/POORLY:18703}. Breast complaints ***.  Mammogram: discontinues DEXA: none Pap smear: 11/2017 thin prep Colonoscopy: 07/2019  Immunization History  Administered Date(s) Administered   Influenza,inj,Quad PF,6+ Mos 12/20/2014, 05/31/2020   Influenza,inj,quad, With Preservative 02/09/2019   Influenza-Unspecified 02/09/2019   Moderna Sars-Covid-2 Vaccination 06/26/2019, 07/25/2019   PPD Test 11/18/2016   Tdap 09/20/2011   Zoster Recombinat (Shingrix) 02/22/2019, 08/23/2019    HPI  Lab Results  Component Value Date   CREATININE 0.71 08/24/2020   BUN 14 08/24/2020   NA 135 08/24/2020   K 3.4 (L) 08/24/2020   CL 100 08/24/2020   CO2 26 08/24/2020   Lab Results  Component Value Date   CHOL 264 (H) 11/25/2019   HDL 59 11/25/2019   LDLCALC 183 (H) 11/25/2019   TRIG 121 11/25/2019   CHOLHDL 4.5 (H) 11/25/2019   Lab Results  Component Value Date   TSH 1.250 11/25/2019   Lab Results  Component Value Date   HGBA1C 5.9 (H) 11/25/2019   Lab Results  Component Value Date   WBC 8.6 08/24/2020   HGB 13.3 08/24/2020   HCT 39.0 08/24/2020   MCV 92.4 08/24/2020   PLT 292 08/24/2020   Lab Results  Component Value Date   ALT 22 08/24/2020   AST 28 08/24/2020   ALKPHOS 70 08/24/2020   BILITOT 0.8 08/24/2020     Review of Systems  Constitutional:  Negative for chills, fatigue and fever.  HENT:  Negative for congestion, hearing loss, tinnitus, trouble swallowing and voice change.   Eyes:  Negative for visual disturbance.  Respiratory:  Negative for cough, chest tightness, shortness of breath and wheezing.   Cardiovascular:  Negative  for chest pain, palpitations and leg swelling.  Gastrointestinal:  Negative for abdominal pain, constipation, diarrhea and vomiting.  Endocrine: Negative for polydipsia and polyuria.  Genitourinary:  Negative for dysuria, frequency, genital sores, vaginal bleeding and vaginal discharge.  Musculoskeletal:  Negative for arthralgias, gait problem and joint swelling.  Skin:  Negative for color change and rash.  Neurological:  Negative for dizziness, tremors, light-headedness and headaches.  Hematological:  Negative for adenopathy. Does not bruise/bleed easily.  Psychiatric/Behavioral:  Negative for dysphoric mood and sleep disturbance. The patient is not nervous/anxious.    Patient Active Problem List   Diagnosis Date Noted   Breast asymmetry following reconstructive surgery 02/22/2020   Vitamin D deficiency 11/26/2019   Prediabetes 11/26/2019   S/P breast reconstruction, bilateral 11/07/2019   Carrier of high risk cancer gene mutation    Personal history of colonic polyps    Polyp of sigmoid colon    Acquired absence of bilateral breasts and nipples 07/06/2019   Goals of care, counseling/discussion 03/26/2019   Hyponatremia 03/08/2019   Constipation 03/08/2019   Chest wall pain following surgery 03/08/2019   Breast cancer ([place]) 01/14/2019   BRCA2 gene mutation positive in female 12/30/2018   Family history of pancreatic cancer    Family history of lung cancer    Family history of BRCA2 gene positive    Family history of colon cancer    Malignant neoplasm of lower-outer quadrant of right breast of female, estrogen receptor  positive ([place]) 12/17/2018   Hyperlipidemia, mixed 11/19/2016   Tubular adenoma of colon 02/21/2015   Tobacco use disorder 11/27/2014   Essential hypertension 11/27/2014   Hx of abnormal cervical Pap smear 09/07/2011    No Known Allergies  Past Surgical History:  Procedure Laterality Date   ABDOMINAL HYSTERECTOMY     AXILLARY SENTINEL NODE BIOPSY Bilateral  01/14/2019   Procedure: AXILLARY SENTINEL NODE BIOPSY;  Surgeon: Herbert Pun, MD;  Location: [facility];  Service: General;  Laterality: Bilateral;   BREAST BIOPSY Right 12/08/2018   [place] bx of mass with calcs path pending   BREAST BIOPSY Right 12/08/2018   [place] bx of LN, path pending   BREAST RECONSTRUCTION WITH PLACEMENT OF TISSUE EXPANDER AND ALLODERM Bilateral 01/14/2019   Procedure: BILATERAL BREAST RECONSTRUCTION WITH PLACEMENT OF TISSUE EXPANDER AND FLEX HD;  Surgeon: Wallace Going, DO;  Location: [facility];  Service: Plastics;  Laterality: Bilateral;   COLONOSCOPY WITH PROPOFOL N/A 02/17/2015   Procedure: COLONOSCOPY WITH PROPOFOL;  Surgeon: Lucilla Lame, MD;  Location: [place];  Service: Endoscopy;  Laterality: N/A;   COLONOSCOPY WITH PROPOFOL N/A 07/12/2019   Procedure: COLONOSCOPY WITH BIOPSY;  Surgeon: Lucilla Lame, MD;  Location: [place];  Service: Endoscopy;  Laterality: N/A;  priority 3 PORT A CATH NEEDS ACCESSED   CYSTOSCOPY N/A 08/05/2019   Procedure: CYSTOSCOPY;  Surgeon: Homero Fellers, MD;  Location: [facility];  Service: Gynecology;  Laterality: N/A;   LIPOSUCTION WITH LIPOFILLING Bilateral 03/16/2020   Procedure: Bilateral Lipofilling of breasts for symmetry;  Surgeon: Wallace Going, DO;  Location: [place];  Service: Plastics;  Laterality: Bilateral;  1.5 hours   POLYPECTOMY  02/17/2015   Procedure: POLYPECTOMY;  Surgeon: Lucilla Lame, MD;  Location: [place];  Service: Endoscopy;;   POLYPECTOMY N/A 07/12/2019   Procedure: POLYPECTOMY;  Surgeon: Lucilla Lame, MD;  Location: [place];  Service: Endoscopy;  Laterality: N/A;   PORTA CATH REMOVAL Left 10/13/2019   Procedure: PORTA CATH REMOVAL;  Surgeon: Wallace Going, DO;  Location: [place];  Service: Plastics;  Laterality: Left;   PORTACATH PLACEMENT Left 02/18/2019   Procedure: INSERTION PORT-A-CATH;  Surgeon: Herbert Pun, MD;  Location: [facility];  Service: General;  Laterality: Left;   REMOVAL OF BILATERAL TISSUE EXPANDERS WITH PLACEMENT OF BILATERAL BREAST IMPLANTS Bilateral 10/13/2019   Procedure: REMOVAL OF BILATERAL TISSUE EXPANDERS WITH PLACEMENT OF BILATERAL BREAST IMPLANTS;  Surgeon: Wallace Going, DO;  Location: [place];  Service: Plastics;  Laterality: Bilateral;  2 hours please   SIMPLE MASTECTOMY WITH AXILLARY SENTINEL NODE BIOPSY Left 01/14/2019   Procedure: LEFT SIMPLE MASTECTOMY;  Surgeon: Herbert Pun, MD;  Location: [facility];  Service: General;  Laterality: Left;   TOTAL LAPAROSCOPIC HYSTERECTOMY WITH BILATERAL SALPINGO OOPHORECTOMY Bilateral 08/05/2019   Procedure: TOTAL LAPAROSCOPIC HYSTERECTOMY WITH BILATERAL SALPINGO OOPHORECTOMY;  Surgeon: Homero Fellers, MD;  Location: [facility];  Service: Gynecology;  Laterality: Bilateral;   TOTAL MASTECTOMY Right 01/14/2019   Procedure: RIGHT TOTAL MASTECTOMY;  Surgeon: Herbert Pun, MD;  Location: [facility];  Service: General;  Laterality: Right;   WISDOM TOOTH EXTRACTION      Social History   Tobacco Use   Smoking status: Former    Packs/day: 0.05    Years: 20.00    Pack years: 1.00    Types: Cigarettes    Quit date: 01/07/2019    Years since quitting: 1.8   Smokeless tobacco: Never  Tobacco comments:    4 cigs a day.   Vaping Use   Vaping Use: Never used  Substance Use Topics   Alcohol use: Yes    Alcohol/week: 2.0 standard drinks    Types: 2 Standard drinks or equivalent per week    Comment: occasional   Drug use: No     Medication list has been reviewed and updated.  No outpatient medications have been marked as taking for the 11/29/20 encounter (Appointment) with Glean Hess, MD.    [facility] 2/9 Scores 05/31/2020 11/25/2019 06/01/2019 11/23/2018  PHQ - 2 Score 1 0 4 0  PHQ- 9 Score 5 1 10  0    GAD 7 : Generalized Anxiety Score 05/31/2020 11/25/2019 06/01/2019  Nervous, Anxious,  on Edge 0 1 0  Control/stop worrying 0 0 0  Worry too much - different things 0 0 0  Trouble relaxing 0 0 0  Restless 0 0 0  Easily annoyed or irritable 0 0 0  Afraid - awful might happen 0 0 0  Total GAD 7 Score 0 1 0  Anxiety Difficulty - Not difficult at all Not difficult at all    BP Readings from Last 3 Encounters:  08/31/20 (!) 143/88  08/24/20 (!) 139/92  07/11/20 136/85    Physical Exam Vitals and nursing note reviewed.  Constitutional:      General: She is not in acute distress.    Appearance: She is well-developed.  HENT:     Head: Normocephalic and atraumatic.     Right Ear: Tympanic membrane and ear canal normal.     Left Ear: Tympanic membrane and ear canal normal.     Nose:     Right Sinus: No maxillary sinus tenderness.     Left Sinus: No maxillary sinus tenderness.  Eyes:     General: No scleral icterus.       Right eye: No discharge.        Left eye: No discharge.     Conjunctiva/sclera: Conjunctivae normal.  Neck:     Thyroid: No thyromegaly.     Vascular: No carotid bruit.  Cardiovascular:     Rate and Rhythm: Normal rate and regular rhythm.     Pulses: Normal pulses.     Heart sounds: Normal heart sounds.  Pulmonary:     Effort: Pulmonary effort is normal. No respiratory distress.     Breath sounds: No wheezing.  Chest:  Breasts:    Right: No mass, nipple discharge, skin change or tenderness.     Left: No mass, nipple discharge, skin change or tenderness.  Abdominal:     General: Bowel sounds are normal.     Palpations: Abdomen is soft.     Tenderness: There is no abdominal tenderness.  Musculoskeletal:     Cervical back: Normal range of motion. No erythema.     Right lower leg: No edema.     Left lower leg: No edema.  Lymphadenopathy:     Cervical: No cervical adenopathy.  Skin:    General: Skin is warm and dry.     Findings: No rash.  Neurological:     Mental Status: She is alert and oriented to person, place, and time.     Cranial  Nerves: No cranial nerve deficit.     Sensory: No sensory deficit.     Deep Tendon Reflexes: Reflexes are normal and symmetric.  Psychiatric:        Attention and Perception: Attention normal.  Mood and Affect: Mood normal.    Wt Readings from Last 3 Encounters:  08/24/20 162 lb 4.1 oz (73.6 kg)  07/11/20 155 lb (70.3 kg)  05/31/20 157 lb (71.2 kg)    There were no vitals taken for this visit.  Assessment and Plan:

## 2021-01-09 ENCOUNTER — Encounter: Payer: Self-pay | Admitting: Internal Medicine

## 2021-01-09 ENCOUNTER — Ambulatory Visit (INDEPENDENT_AMBULATORY_CARE_PROVIDER_SITE_OTHER): Payer: BC Managed Care – PPO | Admitting: Internal Medicine

## 2021-01-09 ENCOUNTER — Other Ambulatory Visit: Payer: Self-pay

## 2021-01-09 VITALS — BP 124/82 | HR 80 | Temp 98.1°F | Ht 64.0 in | Wt 156.0 lb

## 2021-01-09 DIAGNOSIS — M8580 Other specified disorders of bone density and structure, unspecified site: Secondary | ICD-10-CM | POA: Insufficient documentation

## 2021-01-09 DIAGNOSIS — Z0000 Encounter for general adult medical examination without abnormal findings: Secondary | ICD-10-CM | POA: Diagnosis not present

## 2021-01-09 DIAGNOSIS — M810 Age-related osteoporosis without current pathological fracture: Secondary | ICD-10-CM | POA: Insufficient documentation

## 2021-01-09 DIAGNOSIS — Z23 Encounter for immunization: Secondary | ICD-10-CM | POA: Diagnosis not present

## 2021-01-09 DIAGNOSIS — I10 Essential (primary) hypertension: Secondary | ICD-10-CM | POA: Diagnosis not present

## 2021-01-09 DIAGNOSIS — D126 Benign neoplasm of colon, unspecified: Secondary | ICD-10-CM

## 2021-01-09 DIAGNOSIS — Z9079 Acquired absence of other genital organ(s): Secondary | ICD-10-CM

## 2021-01-09 DIAGNOSIS — Z90722 Acquired absence of ovaries, bilateral: Secondary | ICD-10-CM

## 2021-01-09 DIAGNOSIS — R7303 Prediabetes: Secondary | ICD-10-CM

## 2021-01-09 DIAGNOSIS — E782 Mixed hyperlipidemia: Secondary | ICD-10-CM

## 2021-01-09 DIAGNOSIS — Z90710 Acquired absence of both cervix and uterus: Secondary | ICD-10-CM

## 2021-01-09 LAB — POCT URINALYSIS DIPSTICK
Bilirubin, UA: NEGATIVE
Blood, UA: NEGATIVE
Glucose, UA: NEGATIVE
Ketones, UA: NEGATIVE
Leukocytes, UA: NEGATIVE
Nitrite, UA: NEGATIVE
Protein, UA: NEGATIVE
Spec Grav, UA: 1.01 (ref 1.010–1.025)
Urobilinogen, UA: 0.2 E.U./dL
pH, UA: 6 (ref 5.0–8.0)

## 2021-01-09 MED ORDER — LISINOPRIL-HYDROCHLOROTHIAZIDE 20-25 MG PO TABS: 1.0000 | ORAL_TABLET | Freq: Every day | ORAL | 1 refills | Status: DC

## 2021-01-09 NOTE — Progress Notes (Signed)
Date:  01/09/2021   Name:  Cheryl Mooney   DOB:  [birth year]   MRN:  [id number]   Chief Complaint: Annual Exam (No breast exam no pap ) and Flu Vaccine Cheryl Mooney is a 55 y.o. female who presents today for her Complete Annual Exam. She feels well. She reports exercising does puppy classes once a week and walking dog daily. She reports she is sleeping well.   Mammogram: discontinued DEXA: 02/2020 osteoporosis Pap smear: 11/2017 thin prep: total hysterectomy 07/2019 Colonoscopy: 07/2019 one TA repeat 5 years  Immunization History  Administered Date(s) Administered   Influenza,inj,Quad PF,6+ Mos 12/20/2014, 05/31/2020   Influenza,inj,quad, With Preservative 02/09/2019   Influenza-Unspecified 02/09/2019   Moderna Sars-Covid-2 Vaccination 06/26/2019, 07/25/2019   PPD Test 11/18/2016   Tdap 09/20/2011   Zoster Recombinat (Shingrix) 02/22/2019, 08/23/2019    Hypertension This is a chronic problem. The problem is controlled. Pertinent negatives include no chest pain, headaches, palpitations or shortness of breath. Past treatments include ACE inhibitors and diuretics.   Lab Results  Component Value Date   CREATININE 0.71 08/24/2020   BUN 14 08/24/2020   NA 135 08/24/2020   K 3.4 (L) 08/24/2020   CL 100 08/24/2020   CO2 26 08/24/2020   Lab Results  Component Value Date   CHOL 264 (H) 11/25/2019   HDL 59 11/25/2019   LDLCALC 183 (H) 11/25/2019   TRIG 121 11/25/2019   CHOLHDL 4.5 (H) 11/25/2019   Lab Results  Component Value Date   TSH 1.250 11/25/2019   Lab Results  Component Value Date   HGBA1C 5.9 (H) 11/25/2019   Lab Results  Component Value Date   WBC 8.6 08/24/2020   HGB 13.3 08/24/2020   HCT 39.0 08/24/2020   MCV 92.4 08/24/2020   PLT 292 08/24/2020   Lab Results  Component Value Date   ALT 22 08/24/2020   AST 28 08/24/2020   ALKPHOS 70 08/24/2020   BILITOT 0.8 08/24/2020     Review of Systems  Constitutional:  Negative for chills, fatigue and  fever.  HENT:  Negative for congestion, hearing loss, tinnitus, trouble swallowing and voice change.   Eyes:  Negative for visual disturbance.  Respiratory:  Negative for cough, chest tightness, shortness of breath and wheezing.   Cardiovascular:  Negative for chest pain, palpitations and leg swelling.  Gastrointestinal:  Negative for abdominal pain, constipation, diarrhea and vomiting.  Endocrine: Negative for polydipsia and polyuria.  Genitourinary:  Negative for dysuria, frequency, genital sores, vaginal bleeding and vaginal discharge.  Musculoskeletal:  Negative for arthralgias, gait problem and joint swelling.  Skin:  Negative for color change and rash.  Neurological:  Negative for dizziness, tremors, light-headedness and headaches.  Hematological:  Negative for adenopathy. Does not bruise/bleed easily.  Psychiatric/Behavioral:  Negative for dysphoric mood and sleep disturbance. The patient is not nervous/anxious.    Patient Active Problem List   Diagnosis Date Noted   S/P total hysterectomy and bilateral salpingo-oophorectomy 01/09/2021   Breast asymmetry following reconstructive surgery 02/22/2020   Vitamin D deficiency 11/26/2019   Prediabetes 11/26/2019   S/P breast reconstruction, bilateral 11/07/2019   Carrier of high risk cancer gene mutation    Personal history of colonic polyps    Polyp of sigmoid colon    Acquired absence of bilateral breasts and nipples 07/06/2019   Goals of care, counseling/discussion 03/26/2019   Hyponatremia 03/08/2019   Constipation 03/08/2019   Chest wall pain following surgery 03/08/2019   Breast cancer ([place]) 01/14/2019  BRCA2 gene mutation positive in female 12/30/2018   Family history of pancreatic cancer    Family history of lung cancer    Family history of BRCA2 gene positive    Family history of colon cancer    Malignant neoplasm of lower-outer quadrant of right breast of female, estrogen receptor positive ([place]) 12/17/2018    Hyperlipidemia, mixed 11/19/2016   Tubular adenoma of colon 02/21/2015   Tobacco use disorder 11/27/2014   Essential hypertension 11/27/2014   Hx of abnormal cervical Pap smear 09/07/2011    No Known Allergies  Past Surgical History:  Procedure Laterality Date   AXILLARY SENTINEL NODE BIOPSY Bilateral 01/14/2019   Procedure: AXILLARY SENTINEL NODE BIOPSY;  Surgeon: Herbert Pun, MD;  Location: [facility];  Service: General;  Laterality: Bilateral;   BREAST BIOPSY Right 12/08/2018   [place] bx of mass with calcs path pending   BREAST BIOPSY Right 12/08/2018   [place] bx of LN, path pending   BREAST RECONSTRUCTION WITH PLACEMENT OF TISSUE EXPANDER AND ALLODERM Bilateral 01/14/2019   Procedure: BILATERAL BREAST RECONSTRUCTION WITH PLACEMENT OF TISSUE EXPANDER AND FLEX HD;  Surgeon: Wallace Going, DO;  Location: [facility];  Service: Plastics;  Laterality: Bilateral;   COLONOSCOPY WITH PROPOFOL N/A 02/17/2015   Procedure: COLONOSCOPY WITH PROPOFOL;  Surgeon: Lucilla Lame, MD;  Location: [place];  Service: Endoscopy;  Laterality: N/A;   COLONOSCOPY WITH PROPOFOL N/A 07/12/2019   Procedure: COLONOSCOPY WITH BIOPSY;  Surgeon: Lucilla Lame, MD;  Location: [place];  Service: Endoscopy;  Laterality: N/A;  priority 3 PORT A CATH NEEDS ACCESSED   CYSTOSCOPY N/A 08/05/2019   Procedure: CYSTOSCOPY;  Surgeon: Homero Fellers, MD;  Location: [facility];  Service: Gynecology;  Laterality: N/A;   LIPOSUCTION WITH LIPOFILLING Bilateral 03/16/2020   Procedure: Bilateral Lipofilling of breasts for symmetry;  Surgeon: Wallace Going, DO;  Location: [place];  Service: Plastics;  Laterality: Bilateral;  1.5 hours   POLYPECTOMY  02/17/2015   Procedure: POLYPECTOMY;  Surgeon: Lucilla Lame, MD;  Location: [place];  Service: Endoscopy;;   POLYPECTOMY N/A 07/12/2019   Procedure: POLYPECTOMY;  Surgeon: Lucilla Lame, MD;  Location: [place];   Service: Endoscopy;  Laterality: N/A;   PORTA CATH REMOVAL Left 10/13/2019   Procedure: PORTA CATH REMOVAL;  Surgeon: Wallace Going, DO;  Location: [place];  Service: Plastics;  Laterality: Left;   PORTACATH PLACEMENT Left 02/18/2019   Procedure: INSERTION PORT-A-CATH;  Surgeon: Herbert Pun, MD;  Location: [facility];  Service: General;  Laterality: Left;   REMOVAL OF BILATERAL TISSUE EXPANDERS WITH PLACEMENT OF BILATERAL BREAST IMPLANTS Bilateral 10/13/2019   Procedure: REMOVAL OF BILATERAL TISSUE EXPANDERS WITH PLACEMENT OF BILATERAL BREAST IMPLANTS;  Surgeon: Wallace Going, DO;  Location: [place];  Service: Plastics;  Laterality: Bilateral;  2 hours please   SIMPLE MASTECTOMY WITH AXILLARY SENTINEL NODE BIOPSY Left 01/14/2019   Procedure: LEFT SIMPLE MASTECTOMY;  Surgeon: Herbert Pun, MD;  Location: [facility];  Service: General;  Laterality: Left;   TOTAL LAPAROSCOPIC HYSTERECTOMY WITH BILATERAL SALPINGO OOPHORECTOMY Bilateral 08/05/2019   Procedure: TOTAL LAPAROSCOPIC HYSTERECTOMY WITH BILATERAL SALPINGO OOPHORECTOMY;  Surgeon: Homero Fellers, MD;  Location: [facility];  Service: Gynecology;  Laterality: Bilateral;   TOTAL MASTECTOMY Right 01/14/2019   Procedure: RIGHT TOTAL MASTECTOMY;  Surgeon: Herbert Pun, MD;  Location: [facility];  Service: General;  Laterality: Right;   [place] EXTRACTION      Social History  Tobacco Use   Smoking status: Former    Packs/day: 0.05    Years: 20.00    Pack years: 1.00    Types: Cigarettes   Smokeless tobacco: Never  Vaping Use   Vaping Use: Never used  Substance Use Topics   Alcohol use: Yes    Alcohol/week: 2.0 standard drinks    Types: 2 Standard drinks or equivalent per week    Comment: occasional   Drug use: No     Medication list has been reviewed and updated.  Current Meds  Medication Sig   acetaminophen (TYLENOL) 500 MG tablet Take 2 tablets  (1,000 mg total) by mouth every 6 (six) hours as needed for mild pain.   anastrozole (ARIMIDEX) 1 MG tablet Take 1 tablet (1 mg total) by mouth daily.   CALCIUM PO Take by mouth daily.   cetirizine (ZYRTEC) 10 MG tablet Take 10 mg by mouth daily.   Cholecalciferol (VITAMIN D3 PO) Take by mouth daily.   DULoxetine (CYMBALTA) 60 MG capsule Take 1 capsule (60 mg total) by mouth daily.   lisinopril-hydrochlorothiazide (ZESTORETIC) 20-25 MG tablet Take 1 tablet by mouth once daily   multivitamin-iron-minerals-folic acid (CENTRUM) chewable tablet Chew 1 tablet by mouth daily.   omeprazole (PRILOSEC) 40 MG capsule Take 1 capsule by mouth once daily (Patient taking differently: Takes every other day)    PHQ 2/9 Scores 01/09/2021 05/31/2020 11/25/2019 06/01/2019  PHQ - 2 Score 1 1 0 4  PHQ- 9 Score 5 5 1 10     GAD 7 : Generalized Anxiety Score 01/09/2021 05/31/2020 11/25/2019 06/01/2019  Nervous, Anxious, on Edge 1 0 1 0  Control/stop worrying 0 0 0 0  Worry too much - different things 1 0 0 0  Trouble relaxing 1 0 0 0  Restless 0 0 0 0  Easily annoyed or irritable 0 0 0 0  Afraid - awful might happen 0 0 0 0  Total GAD 7 Score 3 0 1 0  Anxiety Difficulty - - Not difficult at all Not difficult at all    BP Readings from Last 3 Encounters:  01/09/21 124/82  08/31/20 (!) 143/88  08/24/20 (!) 139/92    Physical Exam Vitals and nursing note reviewed.  Constitutional:      General: She is not in acute distress.    Appearance: She is well-developed.  HENT:     Head: Normocephalic and atraumatic.     Right Ear: Tympanic membrane and ear canal normal.     Left Ear: Tympanic membrane and ear canal normal.     Nose:     Right Sinus: No maxillary sinus tenderness.     Left Sinus: No maxillary sinus tenderness.  Eyes:     General: No scleral icterus.       Right eye: No discharge.        Left eye: No discharge.     Conjunctiva/sclera: Conjunctivae normal.  Neck:     Thyroid: No thyromegaly.      Vascular: No carotid bruit.  Cardiovascular:     Rate and Rhythm: Normal rate and regular rhythm.     Pulses: Normal pulses.     Heart sounds: Normal heart sounds.  Pulmonary:     Effort: Pulmonary effort is normal. No respiratory distress.     Breath sounds: No wheezing.  Chest:  Breasts:    Right: Absent.     Left: Absent.  Abdominal:     General: Bowel sounds are normal.  Palpations: Abdomen is soft.     Tenderness: There is no abdominal tenderness.  Musculoskeletal:        General: Normal range of motion.     Cervical back: Normal range of motion. No erythema.     Right lower leg: No edema.     Left lower leg: No edema.  Lymphadenopathy:     Cervical: No cervical adenopathy.  Skin:    General: Skin is warm and dry.     Capillary Refill: Capillary refill takes less than 2 seconds.     Findings: No rash.  Neurological:     Mental Status: She is alert and oriented to person, place, and time.     Cranial Nerves: No cranial nerve deficit.     Sensory: No sensory deficit.     Deep Tendon Reflexes: Reflexes are normal and symmetric.  Psychiatric:        Attention and Perception: Attention normal.        Mood and Affect: Mood normal.    Wt Readings from Last 3 Encounters:  01/09/21 156 lb (70.8 kg)  08/24/20 162 lb 4.1 oz (73.6 kg)  07/11/20 155 lb (70.3 kg)    BP 124/82   Pulse 80   Temp 98.1 F (36.7 C) (Oral)   Ht 5' 4"  (1.626 m)   Wt 156 lb (70.8 kg)   SpO2 97%   BMI 26.78 kg/m   Assessment and Plan: 1. Annual physical exam Normal exam. Continue healthy diet, exercise  2. Essential hypertension Clinically stable exam with well controlled BP. Tolerating medications without side effects at this time. Pt to continue current regimen and low sodium diet; benefits of regular exercise as able discussed. - CBC with Differential/Platelet - Comprehensive metabolic panel - TSH - POCT urinalysis dipstick - lisinopril-hydrochlorothiazide (ZESTORETIC) 20-25 MG  tablet; Take 1 tablet by mouth daily.  Dispense: 90 tablet; Refill: 1  3. Hyperlipidemia, mixed Check labs and advise  - Lipid panel  4. Prediabetes Continue lifestyle changes - Hemoglobin A1c  5. S/P total hysterectomy and bilateral salpingo-oophorectomy No longer requires Pap smears routinely  6. Tubular adeno ma of colon Colonoscopy in 2021 - repeat in 4 more years  7. Osteoporosis without current pathological fracture, unspecified osteoporosis type Mild OP by DEXA 02/2020 - Oncology to discuss therapy options.   Partially dictated using Editor, commissioning. Any errors are unintentional.  Halina Maidens, MD [facility]  01/09/2021

## 2021-01-10 ENCOUNTER — Encounter: Payer: Self-pay | Admitting: Oncology

## 2021-01-10 LAB — CBC WITH DIFFERENTIAL/PLATELET
Basophils Absolute: 0.1 10*3/uL (ref 0.0–0.2)
Basos: 1 %
EOS (ABSOLUTE): 0.3 10*3/uL (ref 0.0–0.4)
Eos: 3 %
Hematocrit: 41.4 % (ref 34.0–46.6)
Hemoglobin: 14.3 g/dL (ref 11.1–15.9)
Immature Grans (Abs): 0 10*3/uL (ref 0.0–0.1)
Immature Granulocytes: 0 %
Lymphocytes Absolute: 4 10*3/uL — ABNORMAL HIGH (ref 0.7–3.1)
Lymphs: 38 %
MCH: 31.8 pg (ref 26.6–33.0)
MCHC: 34.5 g/dL (ref 31.5–35.7)
MCV: 92 fL (ref 79–97)
Monocytes Absolute: 0.7 10*3/uL (ref 0.1–0.9)
Monocytes: 6 %
Neutrophils Absolute: 5.5 10*3/uL (ref 1.4–7.0)
Neutrophils: 52 %
Platelets: 332 10*3/uL (ref 150–450)
RBC: 4.49 x10E6/uL (ref 3.77–5.28)
RDW: 12.6 % (ref 11.7–15.4)
WBC: 10.6 10*3/uL (ref 3.4–10.8)

## 2021-01-10 LAB — LIPID PANEL
Chol/HDL Ratio: 4.6 ratio — ABNORMAL HIGH (ref 0.0–4.4)
Cholesterol, Total: 246 mg/dL — ABNORMAL HIGH (ref 100–199)
HDL: 53 mg/dL (ref >39)
LDL Chol Calc (NIH): 166 mg/dL — ABNORMAL HIGH (ref 0–99)
Triglycerides: 151 mg/dL — ABNORMAL HIGH (ref 0–149)
VLDL Cholesterol Cal: 27 mg/dL (ref 5–40)

## 2021-01-10 LAB — COMPREHENSIVE METABOLIC PANEL
ALT: 23 IU/L (ref 0–32)
AST: 31 IU/L (ref 0–40)
Albumin/Globulin Ratio: 1.9 (ref 1.2–2.2)
Albumin: 4.8 g/dL (ref 3.8–4.9)
Alkaline Phosphatase: 103 IU/L (ref 44–121)
BUN/Creatinine Ratio: 11 (ref 9–23)
BUN: 7 mg/dL (ref 6–24)
Bilirubin Total: 0.3 mg/dL (ref 0.0–1.2)
CO2: 23 mmol/L (ref 20–29)
Calcium: 9.6 mg/dL (ref 8.7–10.2)
Chloride: 97 mmol/L (ref 96–106)
Creatinine, Ser: 0.64 mg/dL (ref 0.57–1.00)
Globulin, Total: 2.5 g/dL (ref 1.5–4.5)
Glucose: 122 mg/dL — ABNORMAL HIGH (ref 70–99)
Potassium: 4.9 mmol/L (ref 3.5–5.2)
Sodium: 138 mmol/L (ref 134–144)
Total Protein: 7.3 g/dL (ref 6.0–8.5)
eGFR: 103 mL/min/{1.73_m2} (ref >59)

## 2021-01-10 LAB — TSH: TSH: 1.4 u[IU]/mL (ref 0.450–4.500)

## 2021-01-10 LAB — HEMOGLOBIN A1C
Est. average glucose Bld gHb Est-mCnc: 128 mg/dL
Hgb A1c MFr Bld: 6.1 % — ABNORMAL HIGH (ref 4.8–5.6)

## 2021-02-01 ENCOUNTER — Telehealth: Payer: Self-pay | Admitting: Oncology

## 2021-02-01 NOTE — Telephone Encounter (Signed)
Pt states that someone called to reschedule from [place] to [facility] for 11-17. Please give her a call back at [phone]

## 2021-02-22 ENCOUNTER — Ambulatory Visit: Payer: BC Managed Care – PPO | Admitting: Oncology

## 2021-02-22 ENCOUNTER — Other Ambulatory Visit: Payer: BC Managed Care – PPO

## 2021-02-22 ENCOUNTER — Ambulatory Visit: Payer: BC Managed Care – PPO

## 2021-03-05 ENCOUNTER — Inpatient Hospital Stay: Payer: BC Managed Care – PPO | Attending: Oncology

## 2021-03-05 ENCOUNTER — Inpatient Hospital Stay: Payer: BC Managed Care – PPO

## 2021-03-05 ENCOUNTER — Inpatient Hospital Stay (HOSPITAL_BASED_OUTPATIENT_CLINIC_OR_DEPARTMENT_OTHER): Payer: BC Managed Care – PPO | Admitting: Oncology

## 2021-03-05 ENCOUNTER — Encounter: Payer: Self-pay | Admitting: Oncology

## 2021-03-05 ENCOUNTER — Other Ambulatory Visit: Payer: Self-pay

## 2021-03-05 VITALS — BP 149/87 | HR 107 | Temp 96.9°F | Wt 158.0 lb

## 2021-03-05 DIAGNOSIS — Z803 Family history of malignant neoplasm of breast: Secondary | ICD-10-CM | POA: Insufficient documentation

## 2021-03-05 DIAGNOSIS — Z90710 Acquired absence of both cervix and uterus: Secondary | ICD-10-CM | POA: Insufficient documentation

## 2021-03-05 DIAGNOSIS — Z9079 Acquired absence of other genital organ(s): Secondary | ICD-10-CM | POA: Insufficient documentation

## 2021-03-05 DIAGNOSIS — Z79811 Long term (current) use of aromatase inhibitors: Secondary | ICD-10-CM

## 2021-03-05 DIAGNOSIS — Z1501 Genetic susceptibility to malignant neoplasm of breast: Secondary | ICD-10-CM

## 2021-03-05 DIAGNOSIS — Z170 Estrogen receptor positive status [ER+]: Secondary | ICD-10-CM | POA: Insufficient documentation

## 2021-03-05 DIAGNOSIS — Z800 Family history of malignant neoplasm of digestive organs: Secondary | ICD-10-CM

## 2021-03-05 DIAGNOSIS — Z1502 Genetic susceptibility to malignant neoplasm of ovary: Secondary | ICD-10-CM

## 2021-03-05 DIAGNOSIS — M810 Age-related osteoporosis without current pathological fracture: Secondary | ICD-10-CM

## 2021-03-05 DIAGNOSIS — Z1509 Genetic susceptibility to other malignant neoplasm: Secondary | ICD-10-CM

## 2021-03-05 DIAGNOSIS — C50511 Malignant neoplasm of lower-outer quadrant of right female breast: Secondary | ICD-10-CM | POA: Insufficient documentation

## 2021-03-05 DIAGNOSIS — Z9013 Acquired absence of bilateral breasts and nipples: Secondary | ICD-10-CM | POA: Insufficient documentation

## 2021-03-05 DIAGNOSIS — Z87891 Personal history of nicotine dependence: Secondary | ICD-10-CM | POA: Diagnosis not present

## 2021-03-05 DIAGNOSIS — Z90722 Acquired absence of ovaries, bilateral: Secondary | ICD-10-CM | POA: Diagnosis not present

## 2021-03-05 DIAGNOSIS — E871 Hypo-osmolality and hyponatremia: Secondary | ICD-10-CM

## 2021-03-05 LAB — CBC WITH DIFFERENTIAL/PLATELET
Abs Immature Granulocytes: 0.02 10*3/uL (ref 0.00–0.07)
Basophils Absolute: 0.1 10*3/uL (ref 0.0–0.1)
Basophils Relative: 1 %
Eosinophils Absolute: 0.2 10*3/uL (ref 0.0–0.5)
Eosinophils Relative: 3 %
HCT: 42.4 % (ref 36.0–46.0)
Hemoglobin: 14.3 g/dL (ref 12.0–15.0)
Immature Granulocytes: 0 %
Lymphocytes Relative: 41 %
Lymphs Abs: 3.5 10*3/uL (ref 0.7–4.0)
MCH: 31.4 pg (ref 26.0–34.0)
MCHC: 33.7 g/dL (ref 30.0–36.0)
MCV: 93.2 fL (ref 80.0–100.0)
Monocytes Absolute: 0.5 10*3/uL (ref 0.1–1.0)
Monocytes Relative: 6 %
Neutro Abs: 4.4 10*3/uL (ref 1.7–7.7)
Neutrophils Relative %: 49 %
Platelets: 297 10*3/uL (ref 150–400)
RBC: 4.55 MIL/uL (ref 3.87–5.11)
RDW: 13.2 % (ref 11.5–15.5)
WBC: 8.7 10*3/uL (ref 4.0–10.5)
nRBC: 0 % (ref 0.0–0.2)

## 2021-03-05 LAB — COMPREHENSIVE METABOLIC PANEL
ALT: 24 U/L (ref 0–44)
AST: 27 U/L (ref 15–41)
Albumin: 4.4 g/dL (ref 3.5–5.0)
Alkaline Phosphatase: 90 U/L (ref 38–126)
Anion gap: 10 (ref 5–15)
BUN: 10 mg/dL (ref 6–20)
CO2: 28 mmol/L (ref 22–32)
Calcium: 9.4 mg/dL (ref 8.9–10.3)
Chloride: 99 mmol/L (ref 98–111)
Creatinine, Ser: 0.58 mg/dL (ref 0.44–1.00)
GFR, Estimated: >60 mL/min (ref >60)
Glucose, Bld: 128 mg/dL — ABNORMAL HIGH (ref 70–99)
Potassium: 3.8 mmol/L (ref 3.5–5.1)
Sodium: 137 mmol/L (ref 135–145)
Total Bilirubin: 0.4 mg/dL (ref 0.3–1.2)
Total Protein: 7.7 g/dL (ref 6.5–8.1)

## 2021-03-05 MED ORDER — ZOLEDRONIC ACID 4 MG/100ML IV SOLN
4.0000 mg | Freq: Once | INTRAVENOUS | Status: AC
  Administered 2021-03-05: 15:00:00 4 mg via INTRAVENOUS
  Filled 2021-03-05: qty 100

## 2021-03-05 MED ORDER — ANASTROZOLE 1 MG PO TABS: 1.0000 mg | ORAL_TABLET | Freq: Every day | ORAL | 1 refills | Status: DC

## 2021-03-05 MED ORDER — SODIUM CHLORIDE 0.9 % IV SOLN
Freq: Once | INTRAVENOUS | Status: AC
  Filled 2021-03-05: qty 250

## 2021-03-05 NOTE — Progress Notes (Signed)
Hematology/Oncology follow up  note [phone] [phone]   Patient Care Team: Glean Hess, MD as PCP - General (Internal Medicine) Jannet Mantis, MD (Dermatology) Rico Junker, RN as Registered Nurse Herbert Pun, MD as Consulting Physician (General Surgery) Earlie Server, MD as Consulting Physician (Oncology) Dillingham, Loel Lofty, DO as Attending Physician (Plastic Surgery)  CHIEF COMPLAINTS/REASON FOR VISIT:  Follow-up for breast cancer.  HISTORY OF PRESENTING ILLNESS:  Cheryl Mooney is a  55 y.o.  female with PMH listed below who was referred to me for evaluation of breast cancer Patient reports feeling mass of her right breast week before her annual mammogram. 12/03/2018 patient had bilateral diagnostic mammogram and ultrasound  which showed suspicious 2.1 x 1.3 x 1.6 irregular mass at 7:00 in the right breast, 2 cm from the nipple. 2 borderline lymph nodes are seen in the right axilla.  1 of the nodes demonstrate a cortex of 4.4 mm.  Both lymph nodes demonstrated retention of fatty hila. Patient underwent ultrasound-guided biopsy of right breast mass as well as ultrasound-guided biopsy of 1 of the 2 mildly abnormal right axillary lymph nodes. 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 Right axilla lymph node negative for malignancy. ER> 90% positive, PR11-50% positive, HER-2 equivocal, 2+ by IHC.  FISH negative.   Nipple discharge: Denies Family history: Mother diagnosed with breast cancer at age of 67, and colon cancer at age of 69.  Mother has BRCA2 mutation.   Maternal grandmother breast cancer, maternal great aunt breast cancer.  Patient recalls that she was tested long time ago and was not aware of any results.  OCP use: In her 20s-30s Estrogen and progesterone therapy: denies History of radiation to chest: denies.  Previous breast surgery:  Denies  BRCA 2 positive.  # 01/14/2019 patient underwent bilateral mastectomy with sentinel lymph node biopsy of left and the right axillary. Right breast showed invasive mammary carcinoma, DCIS positive, apocrine metaplastic, stromal fibrosis, duct ectasia, simple cyst formation, usual epithelial hyperplasia.  Sentinel lymph node on the right side was negative. pT2 pN0 Left prophylactic mastectomy and a sentinel lymph node negative for malignancy.  #OncotypeDX recurrence score 31, adjuvant chemotherapy benefit more than 15%. # She follows up with plastic surgeon Dr. Marla Roe, she has expander, expanding process is being during chemotherapy. Mediport was placed by Dr. Peyton Najjar on 02/18/2019.  Patient had 1 cycle of AC, chemotherapy plan was switched to docetaxel and carboplatin after consulting expert opinion. She has finished 4 cycle of docetaxel and carboplatin. May 2021, patient underwent laparoscopic-assisted vaginal hysterectomy with bilateralsalpingectomy and oophorectomy 10/13/19 bilateral breast reconstruction  Porta cath removal.  03/16/20 treatment for bilateral breast asymmetry treatment.   INTERVAL HISTORY Cheryl SCHARF is a 55 y.o. female who has above history reviewed by me today presents for follow up visit for management of stage I breast cancer, ER PR positive, HER-2 negative, hereditary BRCA 2 mutation.  Problems and complaints are listed below: Patient takes Arimidex daily.  Patient tolerates well  No new complaints.  Review of Systems  Constitutional:  Negative for appetite change, chills and fever.  HENT:   Negative for hearing loss and voice change.   Eyes:  Negative for eye problems.  Respiratory:  Negative for chest tightness and cough.   Cardiovascular:  Negative for chest pain.  Gastrointestinal:  Negative for abdominal distention, abdominal pain, blood in stool and diarrhea.  Endocrine: Positive for hot flashes.  Genitourinary:  Negative for difficulty  urinating and frequency.   Skin:  Negative for itching and rash.  Neurological:  Negative for extremity weakness.  Hematological:  Negative for adenopathy.  Psychiatric/Behavioral:  Negative for confusion. The patient is not nervous/anxious.    MEDICAL HISTORY:  Past Medical History:  Diagnosis Date   Anemia    things seem back to normal   Benign neoplasm of sigmoid colon    Benign neoplasm of transverse colon    Breast cancer ([place])    Depression    Encounter for antineoplastic chemotherapy 12/30/2018   Pathogenic variant in BRCA2 called [id number] identified on the [facility] Breast Cancer STAT Panel. The STAT Breast cancer panel offered by [facility] includes sequencing and rearrangement analysis for the following 9 genes:  ATM, BRCA1, BRCA2, CDH1, CHEK2, PALB2, PTEN, STK11 and TP53.  The report date is 12/30/2018. Common Hereditary Cancer Panel results pending.    Family history of BRCA2 gene positive    Family history of breast cancer    Family history of colon cancer    Family history of lung cancer    Family history of pancreatic cancer    GERD (gastroesophageal reflux disease)    History of kidney stones    Hypertension    Malignant neoplasm of lower-outer quadrant of right breast of female, estrogen receptor positive ([place]) 12/17/2018   Smoker     SURGICAL HISTORY: Past Surgical History:  Procedure Laterality Date   AXILLARY SENTINEL NODE BIOPSY Bilateral 01/14/2019   Procedure: AXILLARY SENTINEL NODE BIOPSY;  Surgeon: Herbert Pun, MD;  Location: [facility];  Service: General;  Laterality: Bilateral;   BREAST BIOPSY Right 12/08/2018   [place] bx of mass with calcs path pending   BREAST BIOPSY Right 12/08/2018   [place] bx of LN, path pending   BREAST RECONSTRUCTION WITH PLACEMENT OF TISSUE EXPANDER AND ALLODERM Bilateral 01/14/2019   Procedure: BILATERAL BREAST RECONSTRUCTION WITH PLACEMENT OF TISSUE EXPANDER AND FLEX HD;  Surgeon: Wallace Going, DO;  Location: [facility];   Service: Plastics;  Laterality: Bilateral;   COLONOSCOPY WITH PROPOFOL N/A 02/17/2015   Procedure: COLONOSCOPY WITH PROPOFOL;  Surgeon: Lucilla Lame, MD;  Location: [place];  Service: Endoscopy;  Laterality: N/A;   COLONOSCOPY WITH PROPOFOL N/A 07/12/2019   Procedure: COLONOSCOPY WITH BIOPSY;  Surgeon: Lucilla Lame, MD;  Location: [place];  Service: Endoscopy;  Laterality: N/A;  priority 3 PORT A CATH NEEDS ACCESSED   CYSTOSCOPY N/A 08/05/2019   Procedure: CYSTOSCOPY;  Surgeon: Homero Fellers, MD;  Location: [facility];  Service: Gynecology;  Laterality: N/A;   LIPOSUCTION WITH LIPOFILLING Bilateral 03/16/2020   Procedure: Bilateral Lipofilling of breasts for symmetry;  Surgeon: Wallace Going, DO;  Location: [place];  Service: Plastics;  Laterality: Bilateral;  1.5 hours   POLYPECTOMY  02/17/2015   Procedure: POLYPECTOMY;  Surgeon: Lucilla Lame, MD;  Location: [place];  Service: Endoscopy;;   POLYPECTOMY N/A 07/12/2019   Procedure: POLYPECTOMY;  Surgeon: Lucilla Lame, MD;  Location: [place];  Service: Endoscopy;  Laterality: N/A;   PORTA CATH REMOVAL Left 10/13/2019   Procedure: PORTA CATH REMOVAL;  Surgeon: Wallace Going, DO;  Location: [place];  Service: Plastics;  Laterality: Left;   PORTACATH PLACEMENT Left 02/18/2019   Procedure: INSERTION PORT-A-CATH;  Surgeon: Herbert Pun, MD;  Location: [facility];  Service: General;  Laterality: Left;   REMOVAL OF BILATERAL TISSUE EXPANDERS WITH PLACEMENT OF BILATERAL BREAST IMPLANTS Bilateral 10/13/2019   Procedure:  REMOVAL OF BILATERAL TISSUE EXPANDERS WITH PLACEMENT OF BILATERAL BREAST IMPLANTS;  Surgeon: Wallace Going, DO;  Location: [place];  Service: Plastics;  Laterality: Bilateral;  2 hours please   SIMPLE MASTECTOMY WITH AXILLARY SENTINEL NODE BIOPSY Left 01/14/2019   Procedure: LEFT SIMPLE MASTECTOMY;  Surgeon:  Herbert Pun, MD;  Location: [facility];  Service: General;  Laterality: Left;   TOTAL LAPAROSCOPIC HYSTERECTOMY WITH BILATERAL SALPINGO OOPHORECTOMY Bilateral 08/05/2019   Procedure: TOTAL LAPAROSCOPIC HYSTERECTOMY WITH BILATERAL SALPINGO OOPHORECTOMY;  Surgeon: Homero Fellers, MD;  Location: [facility];  Service: Gynecology;  Laterality: Bilateral;   TOTAL MASTECTOMY Right 01/14/2019   Procedure: RIGHT TOTAL MASTECTOMY;  Surgeon: Herbert Pun, MD;  Location: [facility];  Service: General;  Laterality: Right;   WISDOM TOOTH EXTRACTION      SOCIAL HISTORY: Social History   Socioeconomic History   Marital status: Married    Spouse name: john   Number of children: Not on file   Years of education: Not on file   Highest education level: Not on file  Occupational History   Occupation: Physicist, medical    Comment: retired  Tobacco Use   Smoking status: Former    Packs/day: 0.05    Years: 20.00    Pack years: 1.00    Types: Cigarettes   Smokeless tobacco: Never  Vaping Use   Vaping Use: Never used  Substance and Sexual Activity   Alcohol use: Yes    Alcohol/week: 2.0 standard drinks    Types: 2 Standard drinks or equivalent per week    Comment: occasional   Drug use: No   Sexual activity: Yes    Birth control/protection: Surgical  Other Topics Concern   Not on file  Social History Narrative   Patient lives with husband.   Social Determinants of Health   Financial Resource Strain: Not on file  Food Insecurity: Not on file  Transportation Needs: Not on file  Physical Activity: Not on file  Stress: Not on file  Social Connections: Not on file  Intimate Partner Violence: Not on file  She is a retired Pharmacist, hospital  FAMILY HISTORY: Family History  Problem Relation Age of Onset   Colon cancer Mother 71   Breast cancer Mother 75       "BRCA2+"   Hypertension Father    Stroke Father    Alzheimer's disease Father    Prostate cancer Father    Breast cancer  Maternal Aunt        mat great aunt   Breast cancer Maternal Grandmother 58   Pancreatic cancer Maternal Grandfather 32    ALLERGIES:  has No Known Allergies.  MEDICATIONS:  Current Outpatient Medications  Medication Sig Dispense Refill   acetaminophen (TYLENOL) 500 MG tablet Take 2 tablets (1,000 mg total) by mouth every 6 (six) hours as needed for mild pain. 30 tablet 0   CALCIUM PO Take by mouth daily.     cetirizine (ZYRTEC) 10 MG tablet Take 10 mg by mouth daily.     Cholecalciferol (VITAMIN D3 PO) Take by mouth daily.     DULoxetine (CYMBALTA) 60 MG capsule Take 1 capsule (60 mg total) by mouth daily. 90 capsule 1   lisinopril-hydrochlorothiazide (ZESTORETIC) 20-25 MG tablet Take 1 tablet by mouth daily. 90 tablet 1   multivitamin-iron-minerals-folic acid (CENTRUM) chewable tablet Chew 1 tablet by mouth daily.     omeprazole (PRILOSEC) 40 MG capsule Take 1 capsule by mouth once daily (Patient taking differently: Takes every other  day) 90 capsule 0   anastrozole (ARIMIDEX) 1 MG tablet Take 1 tablet (1 mg total) by mouth daily. 90 tablet 1   No current facility-administered medications for this visit.     PHYSICAL EXAMINATION: ECOG PERFORMANCE STATUS: 0 - Asymptomatic Vitals:   03/05/21 1434  BP: (!) 149/87  Pulse: (!) 107  Temp: (!) 96.9 F (36.1 C)   Filed Weights   03/05/21 1434  Weight: 158 lb (71.7 kg)    Physical Exam Constitutional:      General: She is not in acute distress. HENT:     Head: Normocephalic and atraumatic.  Eyes:     General: No scleral icterus.    Pupils: Pupils are equal, round, and reactive to light.  Cardiovascular:     Rate and Rhythm: Normal rate and regular rhythm.     Heart sounds: Normal heart sounds.  Pulmonary:     Effort: Pulmonary effort is normal. No respiratory distress.     Breath sounds: No wheezing.  Abdominal:     General: Bowel sounds are normal. There is no distension.     Palpations: Abdomen is soft. There is no  mass.     Tenderness: There is no abdominal tenderness.  Musculoskeletal:        General: No deformity. Normal range of motion.     Cervical back: Normal range of motion and neck supple.  Skin:    General: Skin is warm and dry.     Findings: No erythema or rash.  Neurological:     Mental Status: She is alert and oriented to person, place, and time. Mental status is at baseline.     Cranial Nerves: No cranial nerve deficit.     Coordination: Coordination normal.  Psychiatric:        Mood and Affect: Mood normal.  Bilateral mastectomy with implant Breast exam is performed in seated and lying down position. Patient is status post bilateral mastectomy with implants.  The edges are intact and there is no evidence of any chest wall recurrence.  No palpable bilateral axillary adenopathy   LABORATORY DATA:  I have reviewed the data as listed Lab Results  Component Value Date   WBC 8.7 03/05/2021   HGB 14.3 03/05/2021   HCT 42.4 03/05/2021   MCV 93.2 03/05/2021   PLT 297 03/05/2021   Recent Labs    05/29/20 1258 08/24/20 1313 01/09/21 1042 03/05/21 1411  NA 135 135 138 137  K 3.6 3.4* 4.9 3.8  CL 97* 100 97 99  CO2 25 26 23 28   GLUCOSE 134* 148* 122* 128*  BUN 10 14 7 10   CREATININE 0.67 0.71 0.64 0.58  CALCIUM 9.3 9.4 9.6 9.4  GFRNONAA >60 >60  --  >60  PROT 7.5 7.4 7.3 7.7  ALBUMIN 4.5 4.4 4.8 4.4  AST 22 28 31 27   ALT 17 22 23 24   ALKPHOS 78 70 103 90  BILITOT 0.7 0.8 0.3 0.4    Iron/TIBC/Ferritin/ %Sat No results found for: IRON, TIBC, FERRITIN, IRONPCTSAT   RADIOGRAPHIC STUDIES: I have personally reviewed the radiological images as listed and agreed with the findings in the report. No results found.    ASSESSMENT & PLAN:  1. Family history of pancreatic cancer   2. BRCA2 gene mutation positive in female   3. Aromatase inhibitor use   4. Osteoporosis, unspecified osteoporosis type, unspecified pathological fracture presence    Stage IB pT2 pN0 M0 ER + PR+  HER2 negative right breast cancer,  grade 3, +LVI,  Bilateral mastectomy and reconstruction [01/14/2019]  -BRCA2 positive Labs are reviewed and discussed with patient .  Continue Arimidex 1 mg daily.   # Osteoporosis. Continue calcium and vitamin D supplementation.  Zometa every 6 months-proceed with Zometa today..  Patient prefers to stay on aromatase inhibitor rather than switch to tamoxifen. Recommend exercise as tolerated.  DEXA every 2 years.  Due next year.  #BRCA2 gene mutation positive,status post hysterectomy with bilateral salpingo-oophorectomy. Annual dermatology evaluation. She has a family history of pancreatic cancer.  03/01/20 MRI abdomen w/wo no evidence of pancreatic mass. hemangioma in left hepatic lobe.  Will obtain annual MRI abdomen with and without contrast.  All questions were answered. The patient knows to call the clinic with any problems questions or concerns. Follow-up in 6 months.   Earlie Server, MD, PhD 03/05/2021

## 2021-03-05 NOTE — Patient Instructions (Signed)

## 2021-03-06 LAB — CANCER ANTIGEN 27.29: CA 27.29: 16.1 U/mL (ref 0.0–38.6)

## 2021-03-06 LAB — CANCER ANTIGEN 15-3: CA 15-3: 16.7 U/mL (ref 0.0–25.0)

## 2021-03-19 ENCOUNTER — Other Ambulatory Visit: Payer: Self-pay

## 2021-03-19 ENCOUNTER — Ambulatory Visit
Admission: RE | Admit: 2021-03-19 | Discharge: 2021-03-19 | Disposition: A | Discharge status: Home / Self Care | Payer: BC Managed Care – PPO | Source: Ambulatory Visit | Attending: Oncology | Admitting: Oncology

## 2021-03-19 DIAGNOSIS — D1803 Hemangioma of intra-abdominal structures: Secondary | ICD-10-CM | POA: Insufficient documentation

## 2021-03-19 DIAGNOSIS — Z1502 Genetic susceptibility to malignant neoplasm of ovary: Secondary | ICD-10-CM | POA: Insufficient documentation

## 2021-03-19 DIAGNOSIS — K760 Fatty (change of) liver, not elsewhere classified: Secondary | ICD-10-CM | POA: Insufficient documentation

## 2021-03-19 DIAGNOSIS — Z1501 Genetic susceptibility to malignant neoplasm of breast: Secondary | ICD-10-CM | POA: Diagnosis present

## 2021-03-19 DIAGNOSIS — Z1509 Genetic susceptibility to other malignant neoplasm: Secondary | ICD-10-CM | POA: Diagnosis not present

## 2021-03-19 DIAGNOSIS — Z800 Family history of malignant neoplasm of digestive organs: Secondary | ICD-10-CM | POA: Insufficient documentation

## 2021-03-19 MED ORDER — GADOBUTROL 1 MMOL/ML IV SOLN
7.0000 mL | Freq: Once | INTRAVENOUS | Status: AC | PRN
  Administered 2021-03-19: 7 mL via INTRAVENOUS

## 2021-04-02 ENCOUNTER — Other Ambulatory Visit: Payer: Self-pay | Admitting: Oncology

## 2021-04-03 ENCOUNTER — Encounter: Payer: Self-pay | Admitting: Oncology

## 2021-05-24 ENCOUNTER — Other Ambulatory Visit: Payer: Self-pay

## 2021-05-24 ENCOUNTER — Ambulatory Visit
Admission: EM | Admit: 2021-05-24 | Discharge: 2021-05-24 | Disposition: A | Discharge status: Home / Self Care | Payer: BC Managed Care – PPO | Attending: Emergency Medicine | Admitting: Emergency Medicine

## 2021-05-24 DIAGNOSIS — R051 Acute cough: Secondary | ICD-10-CM | POA: Diagnosis not present

## 2021-05-24 DIAGNOSIS — J069 Acute upper respiratory infection, unspecified: Secondary | ICD-10-CM | POA: Diagnosis not present

## 2021-05-24 MED ORDER — BENZONATATE 100 MG PO CAPS: 200.0000 mg | ORAL_CAPSULE | Freq: Three times a day (TID) | ORAL | 0 refills | Status: DC

## 2021-05-24 MED ORDER — IPRATROPIUM BROMIDE 0.06 % NA SOLN: 2.0000 | Freq: Four times a day (QID) | NASAL | 12 refills | Status: DC

## 2021-05-24 MED ORDER — AMOXICILLIN-POT CLAVULANATE 875-125 MG PO TABS: 1.0000 | ORAL_TABLET | Freq: Two times a day (BID) | ORAL | 0 refills | Status: AC

## 2021-05-24 MED ORDER — PROMETHAZINE-DM 6.25-15 MG/5ML PO SYRP: 5.0000 mL | ORAL_SOLUTION | Freq: Four times a day (QID) | ORAL | 0 refills | Status: DC | PRN

## 2021-05-24 NOTE — ED Provider Notes (Signed)
[facility]    CSN: [id number] Arrival date & time: 05/24/21  1339      History   Chief Complaint Chief Complaint  Patient presents with   Cough    HPI Cheryl Mooney is a 56 y.o. female.   HPI  56 year old female here for evaluation of respiratory complaints.  Patient reports that she has been experiencing significant cough and chest congestion that is worse at night and is keeping her up and preventing her from sleep.  This is in conjunction with 12 to 13 days of postnasal drip.  Patient endorses a clear to light yellow nasal discharge and intermittent thick sputum production.  She also has intermittent wheezing mostly at nighttime.  She denies any fever, ear pain, sore throat, or shortness of breath.  Past Medical History:  Diagnosis Date   Anemia    things seem back to normal   Benign neoplasm of sigmoid colon    Benign neoplasm of transverse colon    Breast cancer ([place])    Depression    Encounter for antineoplastic chemotherapy 12/30/2018   Pathogenic variant in BRCA2 called [id number] identified on the [facility] Breast Cancer STAT Panel. The STAT Breast cancer panel offered by [facility] includes sequencing and rearrangement analysis for the following 9 genes:  ATM, BRCA1, BRCA2, CDH1, CHEK2, PALB2, PTEN, STK11 and TP53.  The report date is 12/30/2018. Common Hereditary Cancer Panel results pending.    Family history of BRCA2 gene positive    Family history of breast cancer    Family history of colon cancer    Family history of lung cancer    Family history of pancreatic cancer    GERD (gastroesophageal reflux disease)    History of kidney stones    Hypertension    Malignant neoplasm of lower-outer quadrant of right breast of female, estrogen receptor positive ([place]) 12/17/2018   Smoker     Patient Active Problem List   Diagnosis Date Noted   S/P total hysterectomy and bilateral salpingo-oophorectomy 01/09/2021   Osteoporosis 01/09/2021   Breast  asymmetry following reconstructive surgery 02/22/2020   Vitamin D deficiency 11/26/2019   Prediabetes 11/26/2019   S/P breast reconstruction, bilateral 11/07/2019   Carrier of high risk cancer gene mutation    Personal history of colonic polyps    Polyp of sigmoid colon    Acquired absence of bilateral breasts and nipples 07/06/2019   Goals of care, counseling/discussion 03/26/2019   Hyponatremia 03/08/2019   Constipation 03/08/2019   Chest wall pain following surgery 03/08/2019   Breast cancer ([place]) 01/14/2019   BRCA2 gene mutation positive in female 12/30/2018   Family history of pancreatic cancer    Family history of lung cancer    Family history of BRCA2 gene positive    Family history of colon cancer    Malignant neoplasm of lower-outer quadrant of right breast of female, estrogen receptor positive ([place]) 12/17/2018   Hyperlipidemia, mixed 11/19/2016   Tubular adenoma of colon 02/21/2015   Tobacco use disorder 11/27/2014   Essential hypertension 11/27/2014   Hx of abnormal cervical Pap smear 09/07/2011    Past Surgical History:  Procedure Laterality Date   AXILLARY SENTINEL NODE BIOPSY Bilateral 01/14/2019   Procedure: AXILLARY SENTINEL NODE BIOPSY;  Surgeon: Herbert Pun, MD;  Location: [facility];  Service: General;  Laterality: Bilateral;   BREAST BIOPSY Right 12/08/2018   [place] bx of mass with calcs path pending   BREAST BIOPSY Right 12/08/2018   [place] bx of LN, path  pending   BREAST RECONSTRUCTION WITH PLACEMENT OF TISSUE EXPANDER AND ALLODERM Bilateral 01/14/2019   Procedure: BILATERAL BREAST RECONSTRUCTION WITH PLACEMENT OF TISSUE EXPANDER AND FLEX HD;  Surgeon: Wallace Going, DO;  Location: [facility];  Service: Plastics;  Laterality: Bilateral;   COLONOSCOPY WITH PROPOFOL N/A 02/17/2015   Procedure: COLONOSCOPY WITH PROPOFOL;  Surgeon: Lucilla Lame, MD;  Location: [place];  Service: Endoscopy;  Laterality: N/A;   COLONOSCOPY WITH PROPOFOL N/A  07/12/2019   Procedure: COLONOSCOPY WITH BIOPSY;  Surgeon: Lucilla Lame, MD;  Location: [place];  Service: Endoscopy;  Laterality: N/A;  priority 3 PORT A CATH NEEDS ACCESSED   CYSTOSCOPY N/A 08/05/2019   Procedure: CYSTOSCOPY;  Surgeon: Homero Fellers, MD;  Location: [facility];  Service: Gynecology;  Laterality: N/A;   LIPOSUCTION WITH LIPOFILLING Bilateral 03/16/2020   Procedure: Bilateral Lipofilling of breasts for symmetry;  Surgeon: Wallace Going, DO;  Location: [place];  Service: Plastics;  Laterality: Bilateral;  1.5 hours   POLYPECTOMY  02/17/2015   Procedure: POLYPECTOMY;  Surgeon: Lucilla Lame, MD;  Location: [place];  Service: Endoscopy;;   POLYPECTOMY N/A 07/12/2019   Procedure: POLYPECTOMY;  Surgeon: Lucilla Lame, MD;  Location: [place];  Service: Endoscopy;  Laterality: N/A;   PORTA CATH REMOVAL Left 10/13/2019   Procedure: PORTA CATH REMOVAL;  Surgeon: Wallace Going, DO;  Location: [place];  Service: Plastics;  Laterality: Left;   PORTACATH PLACEMENT Left 02/18/2019   Procedure: INSERTION PORT-A-CATH;  Surgeon: Herbert Pun, MD;  Location: [facility];  Service: General;  Laterality: Left;   REMOVAL OF BILATERAL TISSUE EXPANDERS WITH PLACEMENT OF BILATERAL BREAST IMPLANTS Bilateral 10/13/2019   Procedure: REMOVAL OF BILATERAL TISSUE EXPANDERS WITH PLACEMENT OF BILATERAL BREAST IMPLANTS;  Surgeon: Wallace Going, DO;  Location: [place];  Service: Plastics;  Laterality: Bilateral;  2 hours please   SIMPLE MASTECTOMY WITH AXILLARY SENTINEL NODE BIOPSY Left 01/14/2019   Procedure: LEFT SIMPLE MASTECTOMY;  Surgeon: Herbert Pun, MD;  Location: [facility];  Service: General;  Laterality: Left;   TOTAL LAPAROSCOPIC HYSTERECTOMY WITH BILATERAL SALPINGO OOPHORECTOMY Bilateral 08/05/2019   Procedure: TOTAL LAPAROSCOPIC HYSTERECTOMY WITH BILATERAL SALPINGO  OOPHORECTOMY;  Surgeon: Homero Fellers, MD;  Location: [facility];  Service: Gynecology;  Laterality: Bilateral;   TOTAL MASTECTOMY Right 01/14/2019   Procedure: RIGHT TOTAL MASTECTOMY;  Surgeon: Herbert Pun, MD;  Location: [facility];  Service: General;  Laterality: Right;   WISDOM TOOTH EXTRACTION      OB History     Gravida  0   Para  0   Term  0   Preterm  0   AB  0   Living  0      SAB  0   IAB  0   Ectopic  0   Multiple  0   Live Births  0            Home Medications    Prior to Admission medications   Medication Sig Start Date End Date Taking? Authorizing Provider  amoxicillin-clavulanate (AUGMENTIN) 875-125 MG tablet Take 1 tablet by mouth every 12 (twelve) hours for 10 days. 05/24/21 06/03/21 Yes Margarette Canada, NP  anastrozole (ARIMIDEX) 1 MG tablet Take 1 tablet (1 mg total) by mouth daily. 03/05/21  Yes Earlie Server, MD  benzonatate (TESSALON) 100 MG capsule Take 2 capsules (200 mg total) by mouth every 8 (eight) hours. 05/24/21  Yes Margarette Canada, NP  CALCIUM  PO Take by mouth daily.   Yes [provider]  cetirizine (ZYRTEC) 10 MG tablet Take 10 mg by mouth daily.   Yes [provider]  Cholecalciferol (VITAMIN D3 PO) Take by mouth daily.   Yes [provider]  DULoxetine (CYMBALTA) 60 MG capsule Take 1 capsule by mouth once daily 04/03/21  Yes Earlie Server, MD  ipratropium (ATROVENT) 0.06 % nasal spray Place 2 sprays into both nostrils 4 (four) times daily. 05/24/21  Yes Margarette Canada, NP  lisinopril-hydrochlorothiazide (ZESTORETIC) 20-25 MG tablet Take 1 tablet by mouth daily. 01/09/21  Yes Glean Hess, MD  multivitamin-iron-minerals-folic acid (CENTRUM) chewable tablet Chew 1 tablet by mouth daily.   Yes [provider]  omeprazole (PRILOSEC) 40 MG capsule Take 1 capsule by mouth once daily Patient taking differently: Takes every other day 08/17/20  Yes Glean Hess, MD  promethazine-dextromethorphan  (PROMETHAZINE-DM) 6.25-15 MG/5ML syrup Take 5 mLs by mouth 4 (four) times daily as needed. 05/24/21  Yes Margarette Canada, NP  acetaminophen (TYLENOL) 500 MG tablet Take 2 tablets (1,000 mg total) by mouth every 6 (six) hours as needed for mild pain. 08/05/19   Homero Fellers, MD    Family History Family History  Problem Relation Age of Onset   Colon cancer Mother 54   Breast cancer Mother 28       "BRCA2+"   Hypertension Father    Stroke Father    Alzheimer's disease Father    Prostate cancer Father    Breast cancer Maternal Aunt        mat great aunt   Breast cancer Maternal Grandmother 58   Pancreatic cancer Maternal Grandfather 14    Social History Social History   Tobacco Use   Smoking status: Former    Packs/day: 0.05    Years: 20.00    Pack years: 1.00    Types: Cigarettes   Smokeless tobacco: Never  Vaping Use   Vaping Use: Never used  Substance Use Topics   Alcohol use: Yes    Alcohol/week: 2.0 standard drinks    Types: 2 Standard drinks or equivalent per week    Comment: occasional   Drug use: No     Allergies   Patient has no known allergies.   Review of Systems Review of Systems  Constitutional:  Negative for activity change, appetite change and fever.  HENT:  Positive for congestion and rhinorrhea. Negative for ear pain and sore throat.   Respiratory:  Positive for cough and wheezing. Negative for shortness of breath.   Hematological: Negative.   Psychiatric/Behavioral: Negative.      Physical Exam Triage Vital Signs ED Triage Vitals [05/24/21 1345]  Enc Vitals Group     BP (!) 148/92     Pulse Rate 92     Resp 20     Temp 98.7 F (37.1 C)     Temp Source Oral     SpO2 99 %     Weight 145 lb (65.8 kg)     Height 5' 5"  (1.651 m)     Head Circumference      Peak Flow      Pain Score 0     Pain Loc      Pain Edu?      Excl. in [place]?    No data found.  Updated Vital Signs BP (!) 148/92 (BP Location: Left Arm)    Pulse 92    Temp  98.7 F (37.1 C) (Oral)  Resp 20    Ht 5' 5"  (1.651 m)    Wt 145 lb (65.8 kg)    SpO2 99%    BMI 24.13 kg/m   Visual Acuity Right Eye Distance:   Left Eye Distance:   Bilateral Distance:    Right Eye Near:   Left Eye Near:    Bilateral Near:     Physical Exam Vitals and nursing note reviewed.  Constitutional:      Appearance: Normal appearance. She is not ill-appearing.  HENT:     Head: Normocephalic and atraumatic.     Right Ear: Tympanic membrane, ear canal and external ear normal. There is no impacted cerumen.     Left Ear: Tympanic membrane, ear canal and external ear normal. There is no impacted cerumen.     Nose: Congestion and rhinorrhea present.     Mouth/Throat:     Mouth: Mucous membranes are moist.     Pharynx: Oropharynx is clear. Posterior oropharyngeal erythema present.  Cardiovascular:     Rate and Rhythm: Normal rate and regular rhythm.     Pulses: Normal pulses.     Heart sounds: Normal heart sounds. No murmur heard.   No friction rub. No gallop.  Pulmonary:     Effort: Pulmonary effort is normal.     Breath sounds: Normal breath sounds. No rhonchi.  Chest:     Chest wall: No tenderness.  Musculoskeletal:     Cervical back: Normal range of motion and neck supple.  Lymphadenopathy:     Cervical: No cervical adenopathy.  Skin:    General: Skin is warm and dry.     Capillary Refill: Capillary refill takes less than 2 seconds.     Findings: No erythema or rash.  Neurological:     General: No focal deficit present.     Mental Status: She is alert and oriented to person, place, and time.  Psychiatric:        Mood and Affect: Mood normal.        Behavior: Behavior normal.        Thought Content: Thought content normal.        Judgment: Judgment normal.     UC Treatments / Results  Labs (all labs ordered are listed, but only abnormal results are displayed) Labs Reviewed - No data to display  EKG   Radiology No results  found.  Procedures Procedures (including critical care time)  Medications Ordered in UC Medications - No data to display  Initial Impression / Assessment and Plan / UC Course  I have reviewed the triage vital signs and the nursing notes.  Pertinent labs & imaging results that were available during my care of the patient were reviewed by me and considered in my medical decision making (see chart for details).  Patient is a nontoxic-appearing 56 year old female here for evaluation of 2 weeks worth of respiratory complaints consisting of sinus drainage/postnasal drip and a cough.  She does not have a fever and does not endorse a fever during the last 12 to 13 days of symptoms.  Her physical exam reveals pearly-gray tympanic membranes bilaterally with normal light reflex and clear external auditory canals.  Nasal mucosa is erythematous and edematous with yellow nasal discharge in both nares.  Oropharyngeal exam reveals posterior oropharyngeal erythema with yellow postnasal drip.  No cervical lymphadenopathy appreciated exam.  Cardiopulmonary exam reveals clear lung sounds in all fields.  Patient Damas consistent with an upper respiratory infection and with your postnasal drip as  well as feeding her cough.  Due to the protracted duration of symptoms I will do a trial of antibiotics with Augmentin twice daily for 10 days with food.  I have also prescribed Atrovent nasal spray to help with nasal congestion, Tessalon Perles to help with cough, Promethazine DM cough syrup to help with cough and congestion at bedtime.   Final Clinical Impressions(s) / UC Diagnoses   Final diagnoses:  Upper respiratory tract infection, unspecified type  Acute cough     Discharge Instructions      The Augmentin twice daily with food for 10 days for treatment of your URI.  Perform sinus irrigation 2-3 times a day with a [employer] sinus rinse kit and distilled water.  Do not use tap water.  You can use plain  over-the-counter Mucinex every 6 hours to break up the stickiness of the mucus so your body can clear it.  Increase your oral fluid intake to thin out your mucus so that is also able for your body to clear more easily.  Take an over-the-counter probiotic, such as Culturelle-align-activia, 1 hour after each dose of antibiotic to prevent diarrhea.  Use the Atrovent nasal spray, 2 squirts in each nostril every 6 hours, as needed for runny nose and postnasal drip.  Use the Tessalon Perles every 8 hours during the day.  Take them with a small sip of water.  They may give you some numbness to the base of your tongue or a metallic taste in your mouth, this is normal.  Use the Promethazine DM cough syrup at bedtime for cough and congestion.  It will make you drowsy so do not take it during the day.  If you develop any new or worsening symptoms return for reevaluation or see your primary care provider.      ED Prescriptions     Medication Sig Dispense Auth. Provider   amoxicillin-clavulanate (AUGMENTIN) 875-125 MG tablet Take 1 tablet by mouth every 12 (twelve) hours for 10 days. 20 tablet Margarette Canada, NP   benzonatate (TESSALON) 100 MG capsule Take 2 capsules (200 mg total) by mouth every 8 (eight) hours. 21 capsule Margarette Canada, NP   ipratropium (ATROVENT) 0.06 % nasal spray Place 2 sprays into both nostrils 4 (four) times daily. 15 mL Margarette Canada, NP   promethazine-dextromethorphan (PROMETHAZINE-DM) 6.25-15 MG/5ML syrup Take 5 mLs by mouth 4 (four) times daily as needed. 118 mL Margarette Canada, NP      PDMP not reviewed this encounter.   Margarette Canada, NP 05/24/21 [phone]

## 2021-05-24 NOTE — Discharge Instructions (Signed)
The Augmentin twice daily with food for 10 days for treatment of your URI.  Perform sinus irrigation 2-3 times a day with a [employer] sinus rinse kit and distilled water.  Do not use tap water.  You can use plain over-the-counter Mucinex every 6 hours to break up the stickiness of the mucus so your body can clear it.  Increase your oral fluid intake to thin out your mucus so that is also able for your body to clear more easily.  Take an over-the-counter probiotic, such as Culturelle-align-activia, 1 hour after each dose of antibiotic to prevent diarrhea.  Use the Atrovent nasal spray, 2 squirts in each nostril every 6 hours, as needed for runny nose and postnasal drip.  Use the Tessalon Perles every 8 hours during the day.  Take them with a small sip of water.  They may give you some numbness to the base of your tongue or a metallic taste in your mouth, this is normal.  Use the Promethazine DM cough syrup at bedtime for cough and congestion.  It will make you drowsy so do not take it during the day.  If you develop any new or worsening symptoms return for reevaluation or see your primary care provider.  

## 2021-05-24 NOTE — ED Triage Notes (Signed)
Patient is here for "Chest Congestion, Cough". Started as "sinus drainage". For about "12-13 days". 3 rough nights "the last 3 nights due to cough/congestion, sob maybe". No fever.

## 2021-07-03 ENCOUNTER — Other Ambulatory Visit: Payer: Self-pay | Admitting: Oncology

## 2021-07-04 ENCOUNTER — Encounter: Payer: Self-pay | Admitting: Oncology

## 2021-07-10 ENCOUNTER — Encounter: Payer: Self-pay | Admitting: Internal Medicine

## 2021-07-10 ENCOUNTER — Ambulatory Visit: Payer: BC Managed Care – PPO | Admitting: Internal Medicine

## 2021-07-10 VITALS — BP 118/70 | HR 78 | Ht 65.0 in | Wt 158.5 lb

## 2021-07-10 DIAGNOSIS — E559 Vitamin D deficiency, unspecified: Secondary | ICD-10-CM | POA: Diagnosis not present

## 2021-07-10 DIAGNOSIS — C50511 Malignant neoplasm of lower-outer quadrant of right female breast: Secondary | ICD-10-CM

## 2021-07-10 DIAGNOSIS — Z23 Encounter for immunization: Secondary | ICD-10-CM | POA: Diagnosis not present

## 2021-07-10 DIAGNOSIS — Z170 Estrogen receptor positive status [ER+]: Secondary | ICD-10-CM

## 2021-07-10 DIAGNOSIS — R7303 Prediabetes: Secondary | ICD-10-CM | POA: Diagnosis not present

## 2021-07-10 DIAGNOSIS — I10 Essential (primary) hypertension: Secondary | ICD-10-CM | POA: Diagnosis not present

## 2021-07-10 DIAGNOSIS — J3089 Other allergic rhinitis: Secondary | ICD-10-CM

## 2021-07-10 MED ORDER — LISINOPRIL-HYDROCHLOROTHIAZIDE 20-25 MG PO TABS: 1.0000 | ORAL_TABLET | Freq: Every day | ORAL | 1 refills | Status: DC

## 2021-07-10 NOTE — Progress Notes (Signed)
? ? ?Date:  07/10/2021  ? ?Name:  Cheryl Mooney   DOB:  06-Apr-1966   MRN:  [id number] ? ? ?Chief Complaint: Diabetes and Hypertension ? ?Hypertension ?This is a chronic problem. The problem is controlled. Pertinent negatives include no chest pain, headaches, palpitations or shortness of breath. Past treatments include ACE inhibitors and diuretics. There is no history of kidney disease, CAD/MI or CVA.  ?Diabetes ?She presents for her follow-up diabetic visit. Diabetes type: prediabetes. Her disease course has been stable. Pertinent negatives for hypoglycemia include no dizziness or headaches. Pertinent negatives for diabetes include no chest pain, no fatigue and no weakness. Pertinent negatives for diabetic complications include no CVA.  ? ?Lab Results  ?Component Value Date  ? NA 137 03/05/2021  ? K 3.8 03/05/2021  ? CO2 28 03/05/2021  ? GLUCOSE 128 (H) 03/05/2021  ? BUN 10 03/05/2021  ? CREATININE 0.58 03/05/2021  ? CALCIUM 9.4 03/05/2021  ? EGFR 103 01/09/2021  ? GFRNONAA >60 03/05/2021  ? ?Lab Results  ?Component Value Date  ? CHOL 246 (H) 01/09/2021  ? HDL 53 01/09/2021  ? LDLCALC 166 (H) 01/09/2021  ? TRIG 151 (H) 01/09/2021  ? CHOLHDL 4.6 (H) 01/09/2021  ? ?Lab Results  ?Component Value Date  ? TSH 1.400 01/09/2021  ? ?Lab Results  ?Component Value Date  ? HGBA1C 6.1 (H) 01/09/2021  ? ?Lab Results  ?Component Value Date  ? WBC 8.7 03/05/2021  ? HGB 14.3 03/05/2021  ? HCT 42.4 03/05/2021  ? MCV 93.2 03/05/2021  ? PLT 297 03/05/2021  ? ?Lab Results  ?Component Value Date  ? ALT 24 03/05/2021  ? AST 27 03/05/2021  ? ALKPHOS 90 03/05/2021  ? BILITOT 0.4 03/05/2021  ? ?Lab Results  ?Component Value Date  ? VD25OH 20.5 (L) 11/25/2019  ?  ? ?Review of Systems  ?Constitutional:  Negative for fatigue and unexpected weight change.  ?HENT:  Negative for nosebleeds.   ?Eyes:  Negative for visual disturbance.  ?Respiratory:  Negative for cough, chest tightness, shortness of breath and wheezing.   ?Cardiovascular:  Negative  for chest pain, palpitations and leg swelling.  ?Gastrointestinal:  Negative for abdominal pain, constipation and diarrhea.  ?Neurological:  Negative for dizziness, weakness, light-headedness and headaches.  ? ?Patient Active Problem List  ? Diagnosis Date Noted  ? S/P total hysterectomy and bilateral salpingo-oophorectomy 01/09/2021  ? Osteoporosis 01/09/2021  ? Breast asymmetry following reconstructive surgery 02/22/2020  ? Vitamin D deficiency 11/26/2019  ? Prediabetes 11/26/2019  ? S/P breast reconstruction, bilateral 11/07/2019  ? Carrier of high risk cancer gene mutation   ? Personal history of colonic polyps   ? Polyp of sigmoid colon   ? Acquired absence of bilateral breasts and nipples 07/06/2019  ? Goals of care, counseling/discussion 03/26/2019  ? Hyponatremia 03/08/2019  ? Constipation 03/08/2019  ? Chest wall pain following surgery 03/08/2019  ? Breast cancer ([place]) 01/14/2019  ? BRCA2 gene mutation positive in female 12/30/2018  ? Family history of pancreatic cancer   ? Family history of lung cancer   ? Family history of BRCA2 gene positive   ? Family history of colon cancer   ? Malignant neoplasm of lower-outer quadrant of right breast of female, estrogen receptor positive ([place]) 12/17/2018  ? Hyperlipidemia, mixed 11/19/2016  ? Tubular adenoma of colon 02/21/2015  ? Tobacco use disorder 11/27/2014  ? Essential hypertension 11/27/2014  ? Hx of abnormal cervical Pap smear 09/07/2011  ? ? ?No Known Allergies ? ?Past Surgical  History:  ?Procedure Laterality Date  ? AXILLARY SENTINEL NODE BIOPSY Bilateral 01/14/2019  ? Procedure: AXILLARY SENTINEL NODE BIOPSY;  Surgeon: Herbert Pun, MD;  Location: [facility];  Service: General;  Laterality: Bilateral;  ? BREAST BIOPSY Right 12/08/2018  ? [place] bx of mass with calcs path pending  ? BREAST BIOPSY Right 12/08/2018  ? [place] bx of LN, path pending  ? BREAST RECONSTRUCTION WITH PLACEMENT OF TISSUE EXPANDER AND ALLODERM Bilateral 01/14/2019  ? Procedure:  BILATERAL BREAST RECONSTRUCTION WITH PLACEMENT OF TISSUE EXPANDER AND FLEX HD;  Surgeon: Wallace Going, DO;  Location: [facility];  Service: Plastics;  Laterality: Bilateral;  ? COLONOSCOPY WITH PROPOFOL N/A 02/17/2015  ? Procedure: COLONOSCOPY WITH PROPOFOL;  Surgeon: Lucilla Lame, MD;  Location: [place];  Service: Endoscopy;  Laterality: N/A;  ? COLONOSCOPY WITH PROPOFOL N/A 07/12/2019  ? Procedure: COLONOSCOPY WITH BIOPSY;  Surgeon: Lucilla Lame, MD;  Location: [place];  Service: Endoscopy;  Laterality: N/A;  priority 3 ?PORT A CATH NEEDS ACCESSED  ? CYSTOSCOPY N/A 08/05/2019  ? Procedure: CYSTOSCOPY;  Surgeon: Homero Fellers, MD;  Location: [facility];  Service: Gynecology;  Laterality: N/A;  ? LIPOSUCTION WITH LIPOFILLING Bilateral 03/16/2020  ? Procedure: Bilateral Lipofilling of breasts for symmetry;  Surgeon: Wallace Going, DO;  Location: [place];  Service: Plastics;  Laterality: Bilateral;  1.5 hours  ? POLYPECTOMY  02/17/2015  ? Procedure: POLYPECTOMY;  Surgeon: Lucilla Lame, MD;  Location: [place];  Service: Endoscopy;;  ? POLYPECTOMY N/A 07/12/2019  ? Procedure: POLYPECTOMY;  Surgeon: Lucilla Lame, MD;  Location: [place];  Service: Endoscopy;  Laterality: N/A;  ? PORTA CATH REMOVAL Left 10/13/2019  ? Procedure: PORTA CATH REMOVAL;  Surgeon: Wallace Going, DO;  Location: [place];  Service: Plastics;  Laterality: Left;  ? PORTACATH PLACEMENT Left 02/18/2019  ? Procedure: INSERTION PORT-A-CATH;  Surgeon: Herbert Pun, MD;  Location: [facility];  Service: General;  Laterality: Left;  ? REMOVAL OF BILATERAL TISSUE EXPANDERS WITH PLACEMENT OF BILATERAL BREAST IMPLANTS Bilateral 10/13/2019  ? Procedure: REMOVAL OF BILATERAL TISSUE EXPANDERS WITH PLACEMENT OF BILATERAL BREAST IMPLANTS;  Surgeon: Wallace Going, DO;  Location: [place];  Service: Plastics;  Laterality: Bilateral;   2 hours please  ? SIMPLE MASTECTOMY WITH AXILLARY SENTINEL NODE BIOPSY Left 01/14/2019  ? Procedure: LEFT SIMPLE MASTECTOMY;  Surgeon: Herbert Pun, MD;  Location: [facility];  Service: General;  Laterality: Left;  ? TOTAL LAPAROSCOPIC HYSTERECTOMY WITH BILATERAL SALPINGO OOPHORECTOMY Bilateral 08/05/2019  ? Procedure: TOTAL LAPAROSCOPIC HYSTERECTOMY WITH BILATERAL SALPINGO OOPHORECTOMY;  Surgeon: Homero Fellers, MD;  Location: [facility];  Service: Gynecology;  Laterality: Bilateral;  ? TOTAL MASTECTOMY Right 01/14/2019  ? Procedure: RIGHT TOTAL MASTECTOMY;  Surgeon: Herbert Pun, MD;  Location: [facility];  Service: General;  Laterality: Right;  ? WISDOM TOOTH EXTRACTION    ? ? ?Social History  ? ?Tobacco Use  ? Smoking status: Former  ?  Packs/day: 0.05  ?  Years: 20.00  ?  Pack years: 1.00  ?  Types: Cigarettes  ? Smokeless tobacco: Never  ?Vaping Use  ? Vaping Use: Never used  ?Substance Use Topics  ? Alcohol use: Yes  ?  Alcohol/week: 2.0 standard drinks  ?  Types: 2 Standard drinks or equivalent per week  ?  Comment: occasional  ? Drug use: No  ? ? ? ?Medication list has been reviewed and updated. ? ?Current Meds  ?Medication Sig  ?  acetaminophen (TYLENOL) 500 MG tablet Take 2 tablets (1,000 mg total) by mouth every 6 (six) hours as needed for mild pain.  ? anastrozole (ARIMIDEX) 1 MG tablet Take 1 tablet (1 mg total) by mouth daily.  ? CALCIUM PO Take by mouth daily.  ? Calcium Polycarbophil 625 MG CHEW Chew by mouth.  ? cetirizine (ZYRTEC) 10 MG tablet Take 10 mg by mouth daily.  ? Cholecalciferol (VITAMIN D3 PO) Take by mouth daily.  ? Cholecalciferol (VITAMIN D3) 100000 UNIT/GM POWD Take by mouth.  ? DULoxetine (CYMBALTA) 60 MG capsule Take 1 capsule by mouth once daily  ? lisinopril-hydrochlorothiazide (ZESTORETIC) 20-25 MG tablet Take 1 tablet by mouth daily.  ? multivitamin-iron-minerals-folic acid (CENTRUM) chewable tablet Chew 1 tablet by mouth daily.  ? omeprazole (PRILOSEC) 40  MG capsule Take 1 capsule by mouth once daily (Patient taking differently: Takes every other day)  ? ? ? ?  07/10/2021  ? 10:32 AM 01/09/2021  ? 10:14 AM 05/31/2020  ?  8:49 AM 11/25/2019  ?  9:02 AM  ?GAD 7 : G

## 2021-07-11 LAB — HEMOGLOBIN A1C
Est. average glucose Bld gHb Est-mCnc: 137 mg/dL
Hgb A1c MFr Bld: 6.4 % — ABNORMAL HIGH (ref 4.8–5.6)

## 2021-07-11 LAB — VITAMIN D 25 HYDROXY (VIT D DEFICIENCY, FRACTURES): Vit D, 25-Hydroxy: 35.4 ng/mL (ref 30.0–100.0)

## 2021-07-15 IMAGING — US US BREAST*R* LIMITED INC AXILLA
1 series · 13 of 13 positions shown · non-contrast
Comparison: Previous exam(s).

CLINICAL DATA: Newly palpable lump in the right breast.

EXAM:
DIGITAL DIAGNOSTIC BILATERAL MAMMOGRAM WITH CAD AND TOMO
ULTRASOUND RIGHT BREAST

[Series 1: us breast*right* limited inc axilla · 0.06mm/px · 13 of 13 slices shown]
[im 1/13]
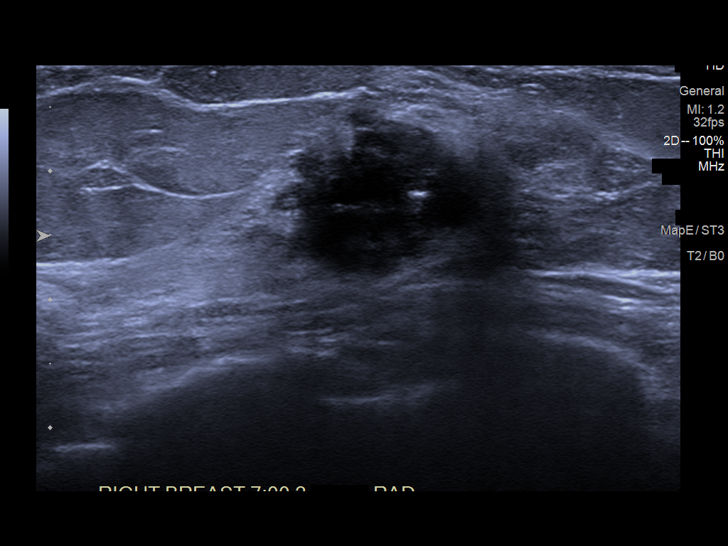
[im 2/13]
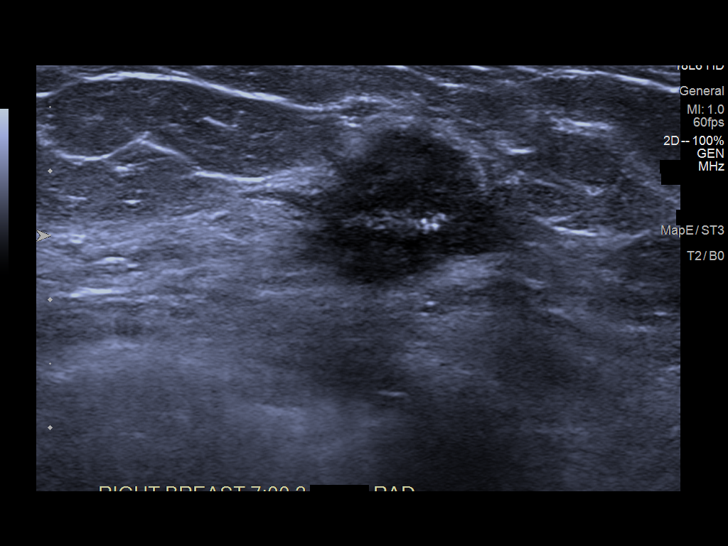
[im 3/13]
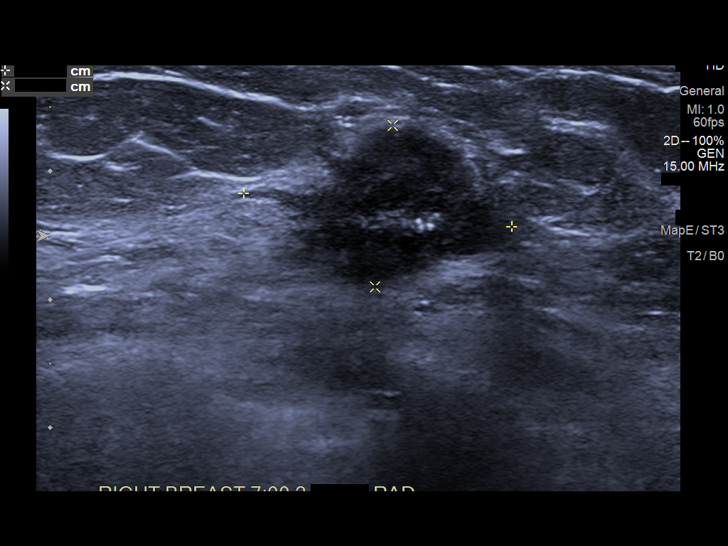
[im 4/13]
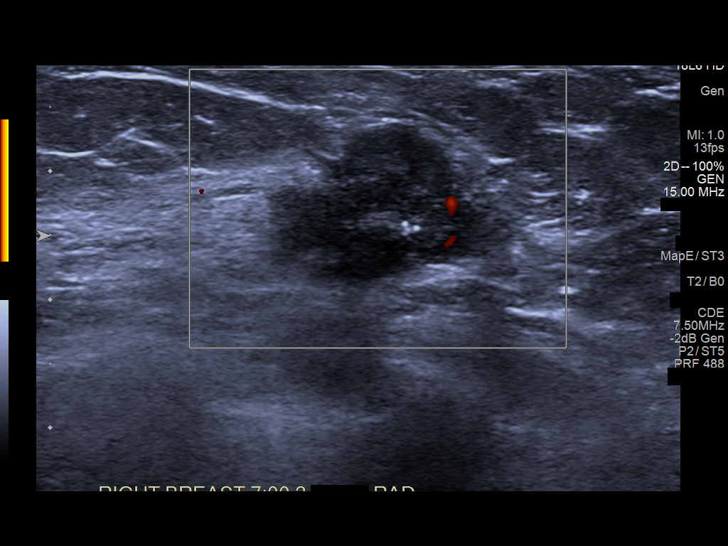
[im 5/13]
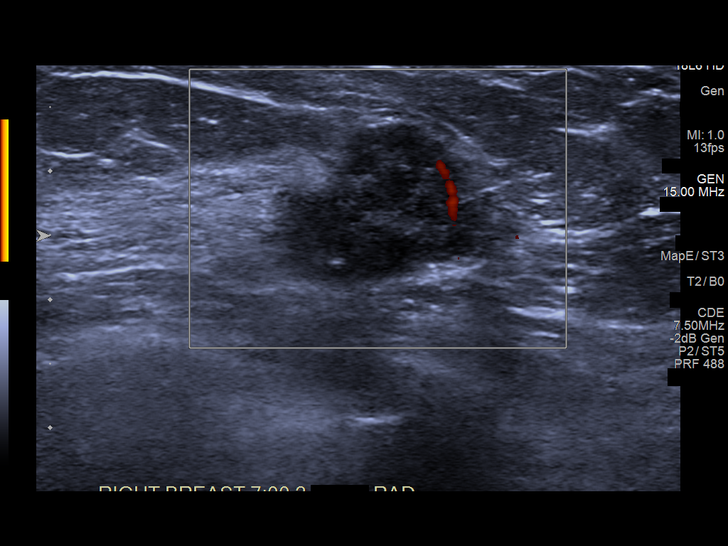
[im 6/13]
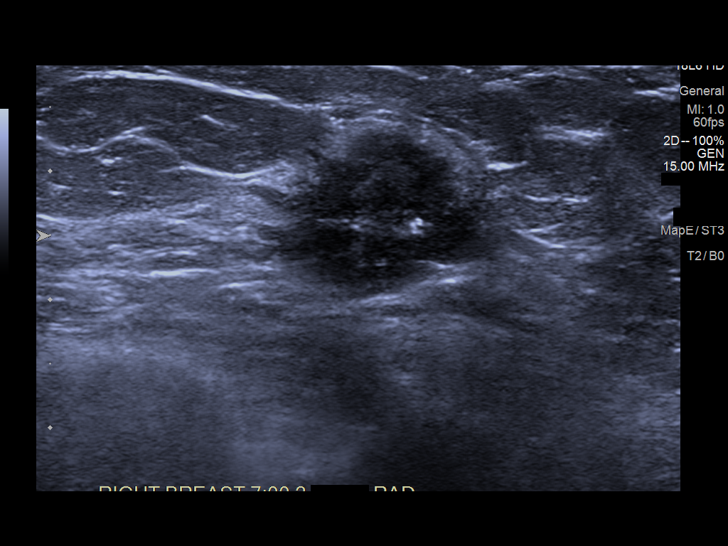
[im 7/13]
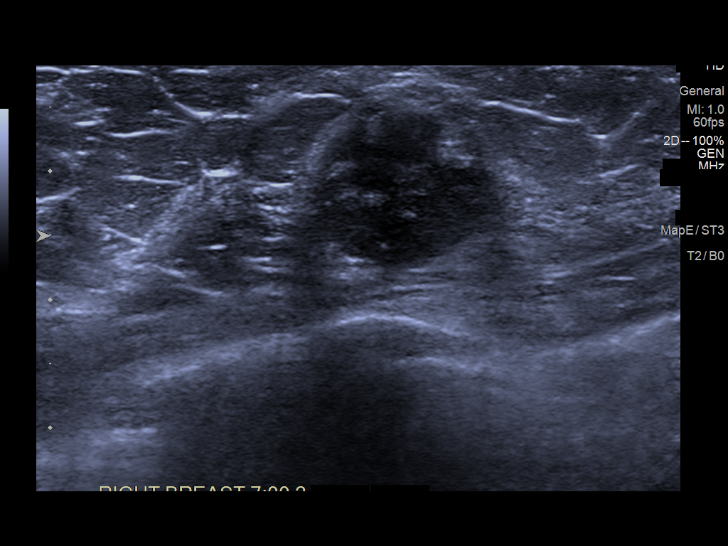
[im 8/13]
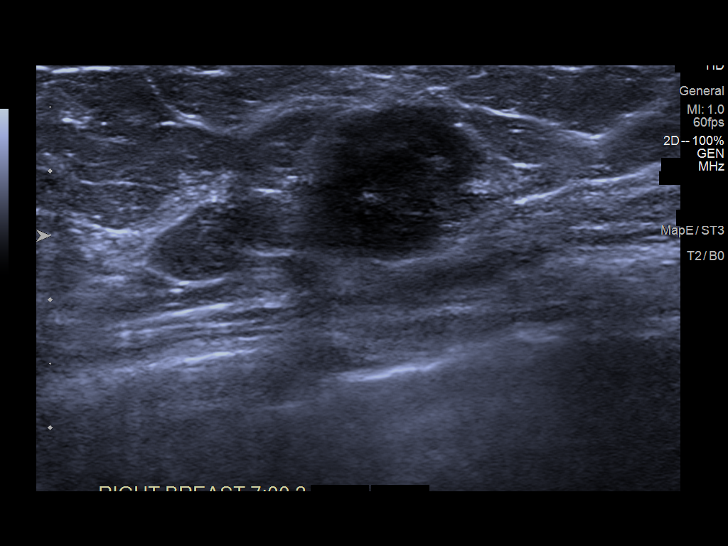
[im 9/13]
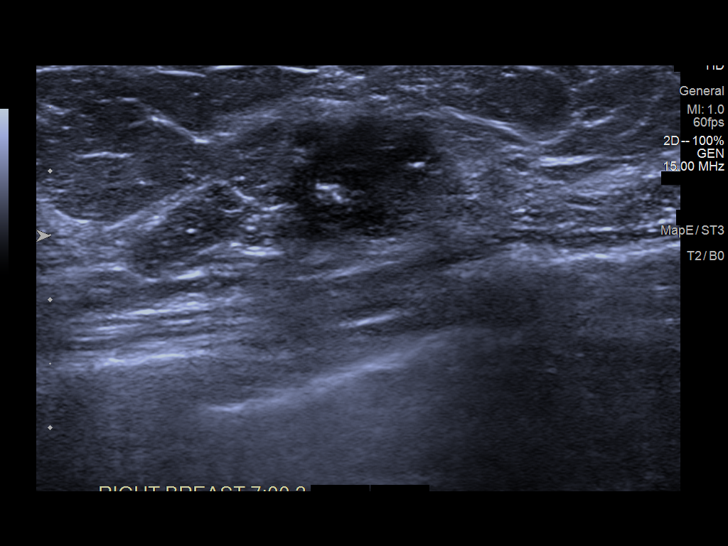
[im 10/13]
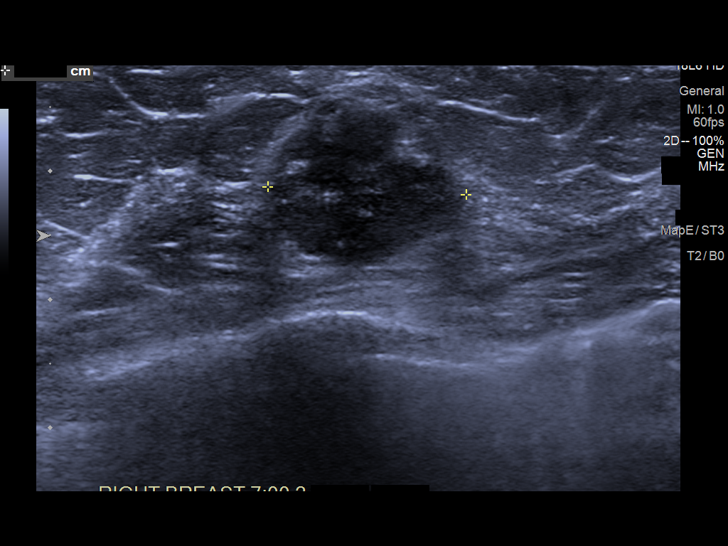
[im 11/13]
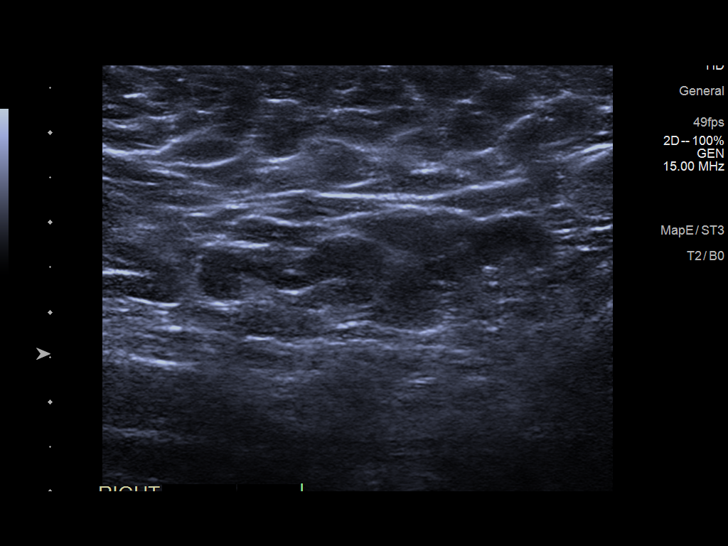
[im 12/13]
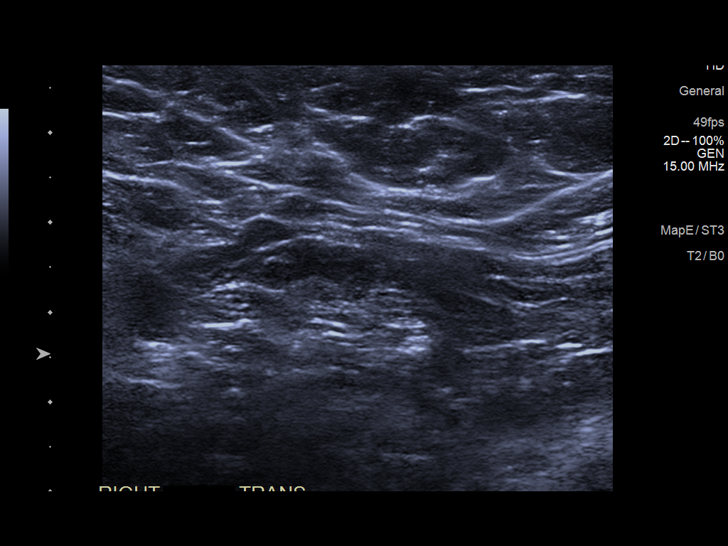
[im 13/13]
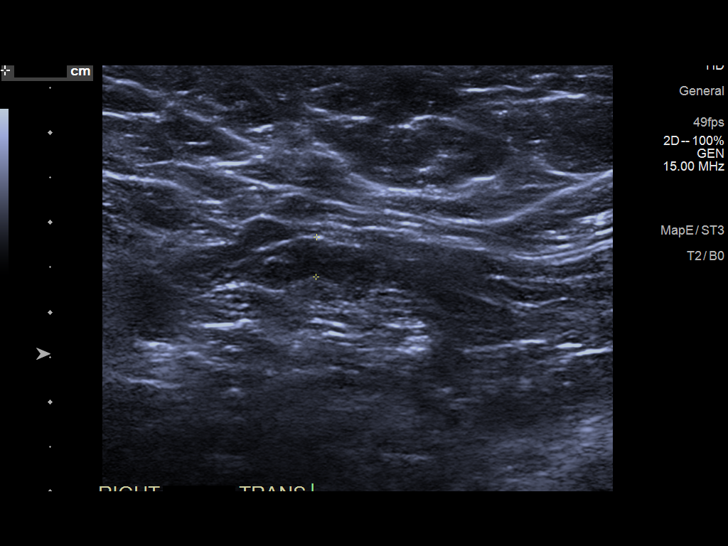

[13 of 13 positions shown; findings below may reference images not displayed]

ACR Breast Density Category c: The breast tissue is heterogeneously
dense, which may obscure small masses.
FINDINGS: There is a new irregular mass in the right breast measuring
approximately 2 cm mammographically. There are calcifications in and
anterior to the mass. The calcifications anterior to the mass span
11 mm. The mass and the calcifications taken together span 2.8 cm.

Mammographic images were processed with CAD.

On physical exam, there is a firm lump in the lateral right breast.

Targeted ultrasound is performed, showing an irregular mass
containing calcifications in the right breast at 7 o'clock, 2 cm
from the nipple measuring 2.1 x 1.3 by 1.6 cm. There are
calcifications within the mass. Two borderline lymph nodes are seen
in the right axilla. One of the nodes demonstrates a cortex of
mm. Both lymph nodes demonstrate retention of fatty hila.
IMPRESSION: Suspicious mass with calcifications in the right breast as above.
Two borderline to mildly abnormal lymph nodes in the right axilla.

RECOMMENDATION:
Recommend ultrasound-guided biopsy of the right breast mass.
Recommend ultrasound-guided biopsy of 1 of the 2 mildly abnormal
right axillary lymph nodes.

I have discussed the findings and recommendations with the patient.
Results were also provided in writing at the conclusion of the
visit. If applicable, a reminder letter will be sent to the patient
regarding the next appointment.

BI-RADS CATEGORY  5: Highly suggestive of malignancy.

## 2021-07-20 IMAGING — MG MM BREAST LOCALIZATION CLIP
4 series · 4 of 12 positions shown · non-contrast
Comparison: Previous exam(s).

CLINICAL DATA: Ultrasound-guided core needle biopsy was performed
of a suspicious mass in the 7 o'clock position of the right breast
and of a right axillary lymph node with mild cortical thickening.

EXAM:
DIAGNOSTIC RIGHT MAMMOGRAM POST ULTRASOUND BIOPSIES

[R CC synth-2D]
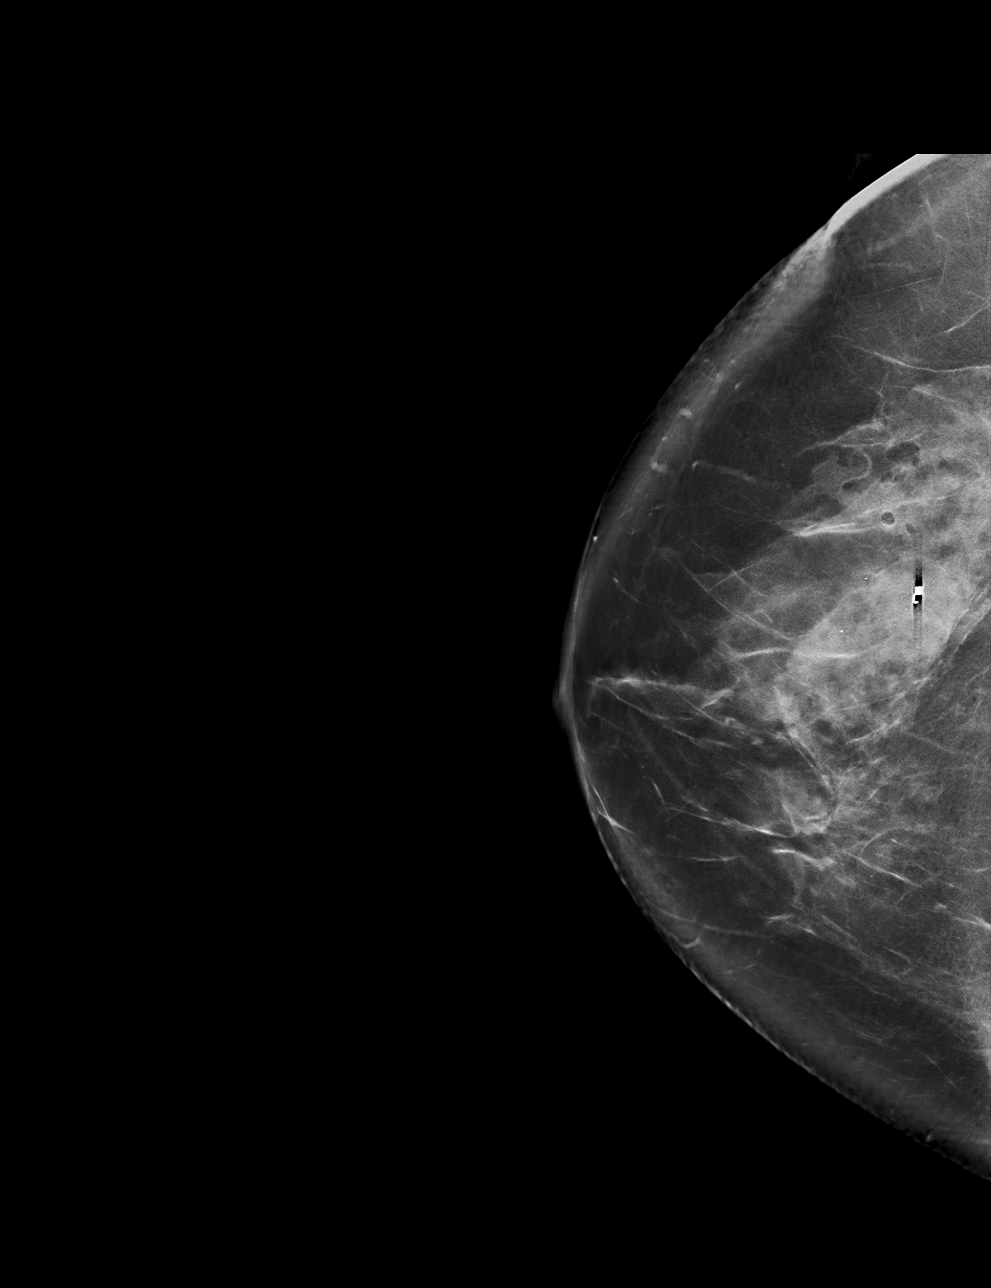

[R ML synth-2D]
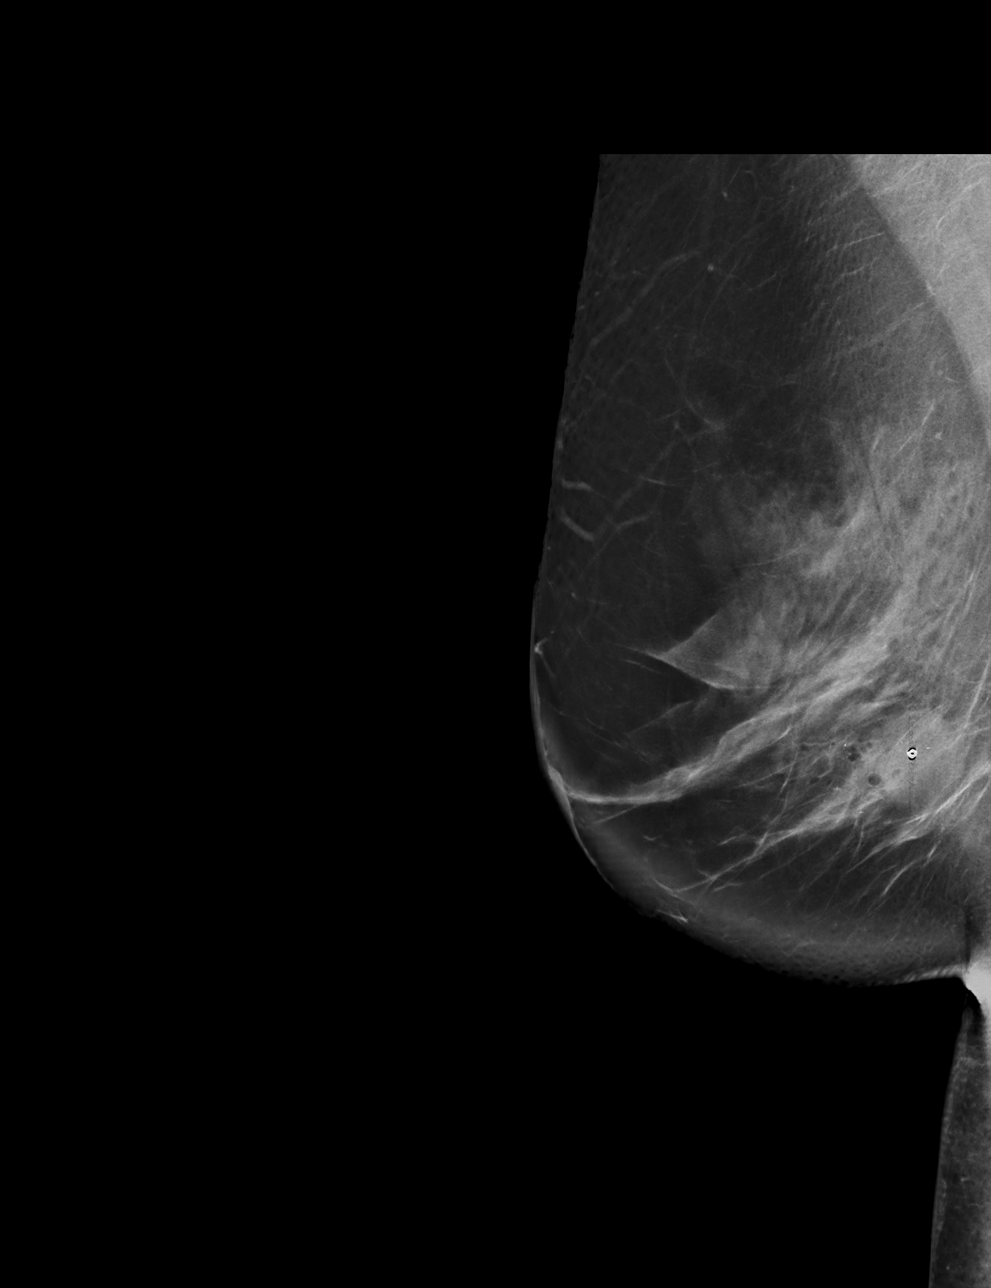

[R ML tomo · tomo slice 51/100.0]
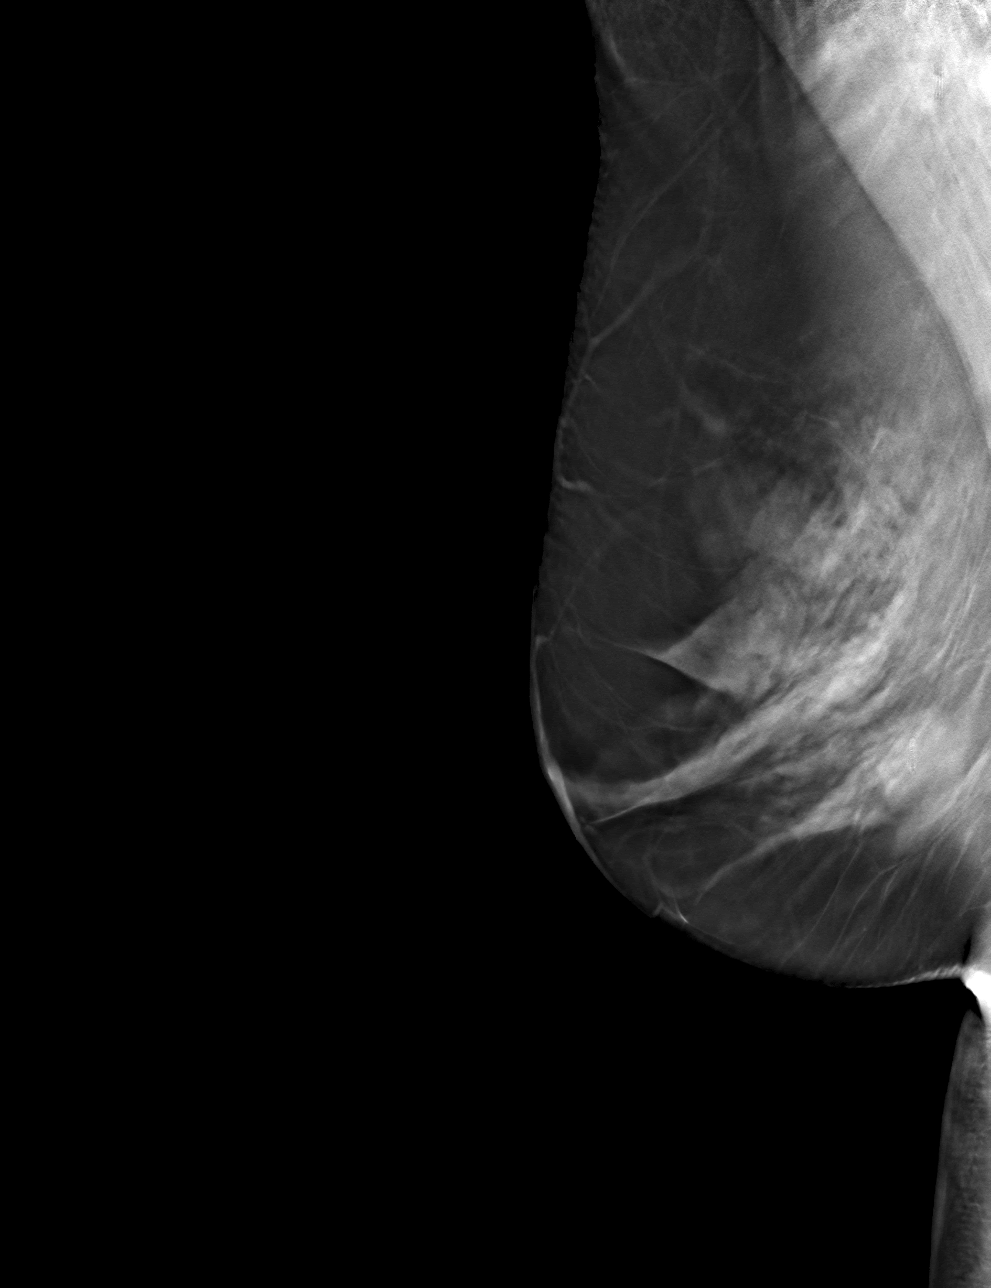

[R CC tomo · tomo slice 46/91.0]
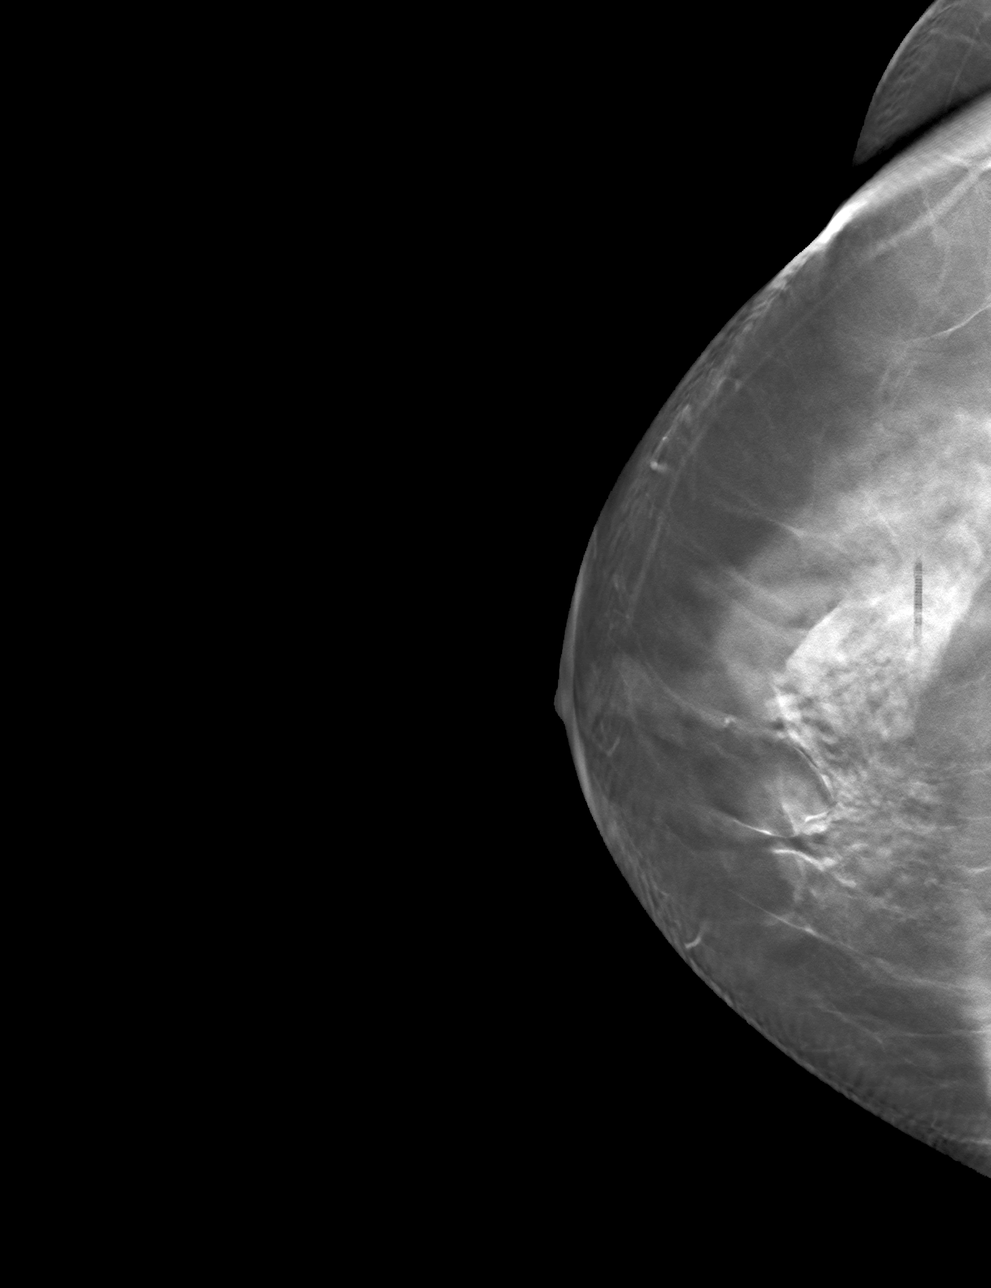

[4 of 12 positions shown; findings below may reference images not displayed]

FINDINGS: Mammographic images were obtained following ultrasound guided biopsy
of a suspicious right breast mass at 7 o'clock position. A coil
shaped biopsy clip is satisfactorily positioned within the
suspicious mass. Again noted are calcifications within the mass.
Similar-appearing calcifications extend anterior to the mass.

HydroMARK biopsy clip is seen within the right axilla in the
expected location of the biopsied lymph node.
IMPRESSION: Satisfactory position of coil shaped biopsy clip within the
suspicious right breast mass.

HydroMARK biopsy clip confirmed in the right axilla, in the expected
region of the axillary node biopsy.

Final Assessment: Post Procedure Mammograms for Marker Placement

## 2021-08-27 ENCOUNTER — Ambulatory Visit: Payer: BC Managed Care – PPO

## 2021-08-27 ENCOUNTER — Other Ambulatory Visit: Payer: BC Managed Care – PPO

## 2021-08-27 ENCOUNTER — Ambulatory Visit: Payer: BC Managed Care – PPO | Admitting: Oncology

## 2021-09-04 ENCOUNTER — Inpatient Hospital Stay (HOSPITAL_BASED_OUTPATIENT_CLINIC_OR_DEPARTMENT_OTHER): Payer: BC Managed Care – PPO | Admitting: Oncology

## 2021-09-04 ENCOUNTER — Ambulatory Visit: Payer: BC Managed Care – PPO

## 2021-09-04 ENCOUNTER — Inpatient Hospital Stay: Payer: BC Managed Care – PPO | Attending: Oncology

## 2021-09-04 ENCOUNTER — Encounter: Payer: Self-pay | Admitting: Oncology

## 2021-09-04 ENCOUNTER — Other Ambulatory Visit: Payer: BC Managed Care – PPO

## 2021-09-04 ENCOUNTER — Ambulatory Visit: Payer: BC Managed Care – PPO | Admitting: Oncology

## 2021-09-04 ENCOUNTER — Inpatient Hospital Stay: Payer: BC Managed Care – PPO

## 2021-09-04 VITALS — BP 139/89 | HR 79 | Temp 97.6°F | Resp 16 | Wt 158.0 lb

## 2021-09-04 DIAGNOSIS — Z1501 Genetic susceptibility to malignant neoplasm of breast: Secondary | ICD-10-CM

## 2021-09-04 DIAGNOSIS — Z170 Estrogen receptor positive status [ER+]: Secondary | ICD-10-CM | POA: Diagnosis not present

## 2021-09-04 DIAGNOSIS — Z9079 Acquired absence of other genital organ(s): Secondary | ICD-10-CM | POA: Insufficient documentation

## 2021-09-04 DIAGNOSIS — C50511 Malignant neoplasm of lower-outer quadrant of right female breast: Secondary | ICD-10-CM

## 2021-09-04 DIAGNOSIS — Z90710 Acquired absence of both cervix and uterus: Secondary | ICD-10-CM | POA: Insufficient documentation

## 2021-09-04 DIAGNOSIS — Z803 Family history of malignant neoplasm of breast: Secondary | ICD-10-CM | POA: Diagnosis not present

## 2021-09-04 DIAGNOSIS — E871 Hypo-osmolality and hyponatremia: Secondary | ICD-10-CM

## 2021-09-04 DIAGNOSIS — Z87891 Personal history of nicotine dependence: Secondary | ICD-10-CM | POA: Diagnosis not present

## 2021-09-04 DIAGNOSIS — Z1509 Genetic susceptibility to other malignant neoplasm: Secondary | ICD-10-CM | POA: Insufficient documentation

## 2021-09-04 DIAGNOSIS — Z9013 Acquired absence of bilateral breasts and nipples: Secondary | ICD-10-CM | POA: Insufficient documentation

## 2021-09-04 DIAGNOSIS — M810 Age-related osteoporosis without current pathological fracture: Secondary | ICD-10-CM | POA: Insufficient documentation

## 2021-09-04 DIAGNOSIS — Z79811 Long term (current) use of aromatase inhibitors: Secondary | ICD-10-CM | POA: Diagnosis not present

## 2021-09-04 DIAGNOSIS — Z1502 Genetic susceptibility to malignant neoplasm of ovary: Secondary | ICD-10-CM

## 2021-09-04 DIAGNOSIS — Z90722 Acquired absence of ovaries, bilateral: Secondary | ICD-10-CM | POA: Diagnosis not present

## 2021-09-04 DIAGNOSIS — Z800 Family history of malignant neoplasm of digestive organs: Secondary | ICD-10-CM | POA: Insufficient documentation

## 2021-09-04 LAB — CBC WITH DIFFERENTIAL/PLATELET
Abs Immature Granulocytes: 0.01 10*3/uL (ref 0.00–0.07)
Basophils Absolute: 0.1 10*3/uL (ref 0.0–0.1)
Basophils Relative: 1 %
Eosinophils Absolute: 0.3 10*3/uL (ref 0.0–0.5)
Eosinophils Relative: 3 %
HCT: 42.1 % (ref 36.0–46.0)
Hemoglobin: 14.2 g/dL (ref 12.0–15.0)
Immature Granulocytes: 0 %
Lymphocytes Relative: 39 %
Lymphs Abs: 3.8 10*3/uL (ref 0.7–4.0)
MCH: 31.5 pg (ref 26.0–34.0)
MCHC: 33.7 g/dL (ref 30.0–36.0)
MCV: 93.3 fL (ref 80.0–100.0)
Monocytes Absolute: 0.5 10*3/uL (ref 0.1–1.0)
Monocytes Relative: 6 %
Neutro Abs: 4.9 10*3/uL (ref 1.7–7.7)
Neutrophils Relative %: 51 %
Platelets: 307 10*3/uL (ref 150–400)
RBC: 4.51 MIL/uL (ref 3.87–5.11)
RDW: 13.4 % (ref 11.5–15.5)
WBC: 9.6 10*3/uL (ref 4.0–10.5)
nRBC: 0 % (ref 0.0–0.2)

## 2021-09-04 LAB — COMPREHENSIVE METABOLIC PANEL
ALT: 22 U/L (ref 0–44)
AST: 23 U/L (ref 15–41)
Albumin: 4.4 g/dL (ref 3.5–5.0)
Alkaline Phosphatase: 76 U/L (ref 38–126)
Anion gap: 7 (ref 5–15)
BUN: 15 mg/dL (ref 6–20)
CO2: 28 mmol/L (ref 22–32)
Calcium: 9.1 mg/dL (ref 8.9–10.3)
Chloride: 100 mmol/L (ref 98–111)
Creatinine, Ser: 0.71 mg/dL (ref 0.44–1.00)
GFR, Estimated: >60 mL/min (ref >60)
Glucose, Bld: 123 mg/dL — ABNORMAL HIGH (ref 70–99)
Potassium: 4.3 mmol/L (ref 3.5–5.1)
Sodium: 135 mmol/L (ref 135–145)
Total Bilirubin: 0.3 mg/dL (ref 0.3–1.2)
Total Protein: 7.8 g/dL (ref 6.5–8.1)

## 2021-09-04 MED ORDER — ZOLEDRONIC ACID 4 MG/100ML IV SOLN
4.0000 mg | Freq: Once | INTRAVENOUS | Status: AC
  Administered 2021-09-04: 4 mg via INTRAVENOUS
  Filled 2021-09-04: qty 100

## 2021-09-04 MED ORDER — SODIUM CHLORIDE 0.9 % IV SOLN
Freq: Once | INTRAVENOUS | Status: AC
  Filled 2021-09-04: qty 250

## 2021-09-04 MED ORDER — LETROZOLE 2.5 MG PO TABS
2.5000 mg | ORAL_TABLET | Freq: Every day | ORAL | 5 refills | Status: DC
Start: 2021-09-04 — End: 2022-03-25

## 2021-09-04 NOTE — Patient Instructions (Signed)

## 2021-09-04 NOTE — Progress Notes (Signed)
Patient tolerated Zometa infusion well, no questions/concerns voiced. Patient stable at discharge. AVS given.

## 2021-09-04 NOTE — Progress Notes (Unsigned)
Pt fell last night at home and is having pain in right rib area.  Pt states pain at times is severe.

## 2021-09-05 ENCOUNTER — Encounter: Payer: Self-pay | Admitting: Oncology

## 2021-09-05 NOTE — Progress Notes (Signed)
Hematology/Oncology Progress note [phone] [phone]      Patient Care Team: Glean Hess, MD as PCP - General (Internal Medicine) Ree Edman, MD (Dermatology) Rico Junker, RN as Registered Nurse Herbert Pun, MD as Consulting Physician (General Surgery) Earlie Server, MD as Consulting Physician (Oncology) Dillingham, Loel Lofty, DO as Attending Physician (Plastic Surgery)  CHIEF COMPLAINTS/REASON FOR VISIT:  Follow-up for breast cancer.  HISTORY OF PRESENTING ILLNESS:  Cheryl Mooney is a  56 y.o.  female with PMH listed below who was referred to me for evaluation of breast cancer Patient reports feeling mass of her right breast week before her annual mammogram. 12/03/2018 patient had bilateral diagnostic mammogram and ultrasound  which showed suspicious 2.1 x 1.3 x 1.6 irregular mass at 7:00 in the right breast, 2 cm from the nipple. 2 borderline lymph nodes are seen in the right axilla.  1 of the nodes demonstrate a cortex of 4.4 mm.  Both lymph nodes demonstrated retention of fatty hila. Patient underwent ultrasound-guided biopsy of right breast mass as well as ultrasound-guided biopsy of 1 of the 2 mildly abnormal right axillary lymph nodes. 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 Right axilla lymph node negative for malignancy. ER> 90% positive, PR11-50% positive, HER-2 equivocal, 2+ by IHC.  FISH negative.   Nipple discharge: Denies Family history: Mother diagnosed with breast cancer at age of 61, and colon cancer at age of 60.  Mother has BRCA2 mutation.   Maternal grandmother breast cancer, maternal great aunt breast cancer.  Patient recalls that she was tested long time ago and was not aware of any results.  OCP use: In her 20s-30s Estrogen and progesterone therapy: denies History of radiation to chest: denies.  Previous breast surgery:  Denies  BRCA 2 positive.  # 01/14/2019 patient underwent bilateral mastectomy with sentinel lymph node biopsy of left and the right axillary. Right breast showed invasive mammary carcinoma, DCIS positive, apocrine metaplastic, stromal fibrosis, duct ectasia, simple cyst formation, usual epithelial hyperplasia.  Sentinel lymph node on the right side was negative. pT2 pN0 Left prophylactic mastectomy and a sentinel lymph node negative for malignancy.  #OncotypeDX recurrence score 31, adjuvant chemotherapy benefit more than 15%. # She follows up with plastic surgeon Dr. Marla Roe, she has expander, expanding process is being during chemotherapy. Mediport was placed by Dr. Peyton Najjar on 02/18/2019.  Patient had 1 cycle of AC, chemotherapy plan was switched to docetaxel and carboplatin after consulting expert opinion. She has finished 4 cycle of docetaxel and carboplatin. March 2021, started on Arimidex. May 2021, patient underwent laparoscopic-assisted vaginal hysterectomy with bilateralsalpingectomy and oophorectomy 10/13/19 bilateral breast reconstruction  Porta cath removal.  03/16/20 treatment for bilateral breast asymmetry treatment.  May 2023, stopped Arimidex and switched to letrozole due to fatigue.   INTERVAL HISTORY Cheryl Mooney is a 56 y.o. female who has above history reviewed by me today presents for follow up visit for management of stage I breast cancer, ER PR positive, HER-2 negative, hereditary BRCA 2 mutation.  Patient takes Arimidex 1 mg daily.  She feels fatigued and is interested in switching to letrozole to see if fatigue may be better with another aromatase inhibitor. She had a mechanical fall yesterday and landed on her right side.  She has some soreness of right flank and breast.  No bleeding.  Review of Systems  Constitutional:  Negative for appetite change, chills and fever.  HENT:   Negative for  hearing loss and voice change.   Eyes:  Negative for eye problems.   Respiratory:  Negative for chest tightness and cough.   Cardiovascular:  Negative for chest pain.  Gastrointestinal:  Negative for abdominal distention, abdominal pain, blood in stool and diarrhea.  Endocrine: Positive for hot flashes.  Genitourinary:  Negative for difficulty urinating and frequency.   Musculoskeletal:  Positive for arthralgias.  Skin:  Negative for itching and rash.  Neurological:  Negative for extremity weakness.  Hematological:  Negative for adenopathy.  Psychiatric/Behavioral:  Negative for confusion. The patient is not nervous/anxious.    MEDICAL HISTORY:  Past Medical History:  Diagnosis Date   Anemia    things seem back to normal   Benign neoplasm of sigmoid colon    Benign neoplasm of transverse colon    Breast cancer ([place])    Depression    Encounter for antineoplastic chemotherapy 12/30/2018   Pathogenic variant in BRCA2 called [id number] identified on the [employer] Breast Cancer STAT Panel. The STAT Breast cancer panel offered by [facility] includes sequencing and rearrangement analysis for the following 9 genes:  ATM, BRCA1, BRCA2, CDH1, CHEK2, PALB2, PTEN, STK11 and TP53.  The report date is 12/30/2018. Common Hereditary Cancer Panel results pending.    Family history of BRCA2 gene positive    Family history of breast cancer    Family history of colon cancer    Family history of lung cancer    Family history of pancreatic cancer    GERD (gastroesophageal reflux disease)    History of kidney stones    Hypertension    Malignant neoplasm of lower-outer quadrant of right breast of female, estrogen receptor positive ([place]) 12/17/2018   Smoker     SURGICAL HISTORY: Past Surgical History:  Procedure Laterality Date   AXILLARY SENTINEL NODE BIOPSY Bilateral 01/14/2019   Procedure: AXILLARY SENTINEL NODE BIOPSY;  Surgeon: Herbert Pun, MD;  Location: [facility];  Service: General;  Laterality: Bilateral;   BREAST BIOPSY Right 12/08/2018   [place] bx of mass  with calcs path pending   BREAST BIOPSY Right 12/08/2018   [place] bx of LN, path pending   BREAST RECONSTRUCTION WITH PLACEMENT OF TISSUE EXPANDER AND ALLODERM Bilateral 01/14/2019   Procedure: BILATERAL BREAST RECONSTRUCTION WITH PLACEMENT OF TISSUE EXPANDER AND FLEX HD;  Surgeon: Wallace Going, DO;  Location: [facility];  Service: Plastics;  Laterality: Bilateral;   COLONOSCOPY WITH PROPOFOL N/A 02/17/2015   Procedure: COLONOSCOPY WITH PROPOFOL;  Surgeon: Lucilla Lame, MD;  Location: [place];  Service: Endoscopy;  Laterality: N/A;   COLONOSCOPY WITH PROPOFOL N/A 07/12/2019   Procedure: COLONOSCOPY WITH BIOPSY;  Surgeon: Lucilla Lame, MD;  Location: [place];  Service: Endoscopy;  Laterality: N/A;  priority 3 PORT A CATH NEEDS ACCESSED   CYSTOSCOPY N/A 08/05/2019   Procedure: CYSTOSCOPY;  Surgeon: Homero Fellers, MD;  Location: [facility];  Service: Gynecology;  Laterality: N/A;   LIPOSUCTION WITH LIPOFILLING Bilateral 03/16/2020   Procedure: Bilateral Lipofilling of breasts for symmetry;  Surgeon: Wallace Going, DO;  Location: [place];  Service: Plastics;  Laterality: Bilateral;  1.5 hours   POLYPECTOMY  02/17/2015   Procedure: POLYPECTOMY;  Surgeon: Lucilla Lame, MD;  Location: [place];  Service: Endoscopy;;   POLYPECTOMY N/A 07/12/2019   Procedure: POLYPECTOMY;  Surgeon: Lucilla Lame, MD;  Location: [place];  Service: Endoscopy;  Laterality: N/A;   PORTA CATH REMOVAL Left 10/13/2019   Procedure: PORTA CATH REMOVAL;  Surgeon: Marla Roe,  Loel Lofty, DO;  Location: [place];  Service: Plastics;  Laterality: Left;   PORTACATH PLACEMENT Left 02/18/2019   Procedure: INSERTION PORT-A-CATH;  Surgeon: Herbert Pun, MD;  Location: [facility];  Service: General;  Laterality: Left;   REMOVAL OF BILATERAL TISSUE EXPANDERS WITH PLACEMENT OF BILATERAL BREAST IMPLANTS Bilateral 10/13/2019   Procedure:  REMOVAL OF BILATERAL TISSUE EXPANDERS WITH PLACEMENT OF BILATERAL BREAST IMPLANTS;  Surgeon: Wallace Going, DO;  Location: [place];  Service: Plastics;  Laterality: Bilateral;  2 hours please   SIMPLE MASTECTOMY WITH AXILLARY SENTINEL NODE BIOPSY Left 01/14/2019   Procedure: LEFT SIMPLE MASTECTOMY;  Surgeon: Herbert Pun, MD;  Location: [facility];  Service: General;  Laterality: Left;   TOTAL LAPAROSCOPIC HYSTERECTOMY WITH BILATERAL SALPINGO OOPHORECTOMY Bilateral 08/05/2019   Procedure: TOTAL LAPAROSCOPIC HYSTERECTOMY WITH BILATERAL SALPINGO OOPHORECTOMY;  Surgeon: Homero Fellers, MD;  Location: [facility];  Service: Gynecology;  Laterality: Bilateral;   TOTAL MASTECTOMY Right 01/14/2019   Procedure: RIGHT TOTAL MASTECTOMY;  Surgeon: Herbert Pun, MD;  Location: [facility];  Service: General;  Laterality: Right;   WISDOM TOOTH EXTRACTION      SOCIAL HISTORY: Social History   Socioeconomic History   Marital status: Married    Spouse name: john   Number of children: Not on file   Years of education: Not on file   Highest education level: Not on file  Occupational History   Occupation: Physicist, medical    Comment: retired  Tobacco Use   Smoking status: Former    Packs/day: 0.05    Years: 20.00    Pack years: 1.00    Types: Cigarettes   Smokeless tobacco: Never  Vaping Use   Vaping Use: Never used  Substance and Sexual Activity   Alcohol use: Yes    Alcohol/week: 2.0 standard drinks    Types: 2 Standard drinks or equivalent per week    Comment: occasional   Drug use: No   Sexual activity: Yes    Birth control/protection: Surgical  Other Topics Concern   Not on file  Social History Narrative   Patient lives with husband.   Social Determinants of Health   Financial Resource Strain: Not on file  Food Insecurity: Not on file  Transportation Needs: Not on file  Physical Activity: Not on file  Stress: Not on file  Social Connections:  Not on file  Intimate Partner Violence: Not on file  She is a retired Pharmacist, hospital  FAMILY HISTORY: Family History  Problem Relation Age of Onset   Colon cancer Mother 60   Breast cancer Mother 31       "BRCA2+"   Hypertension Father    Stroke Father    Alzheimer's disease Father    Prostate cancer Father    Breast cancer Maternal Aunt        mat great aunt   Breast cancer Maternal Grandmother 58   Pancreatic cancer Maternal Grandfather 6    ALLERGIES:  has No Known Allergies.  MEDICATIONS:  Current Outpatient Medications  Medication Sig Dispense Refill   acetaminophen (TYLENOL) 500 MG tablet Take 2 tablets (1,000 mg total) by mouth every 6 (six) hours as needed for mild pain. 30 tablet 0   CALCIUM PO Take by mouth daily.     Calcium Polycarbophil 625 MG CHEW Chew by mouth.     cetirizine (ZYRTEC) 10 MG tablet Take 10 mg by mouth daily.     Cholecalciferol (VITAMIN D3 PO) Take by mouth daily.  Cholecalciferol (VITAMIN D3) 100000 UNIT/GM POWD Take by mouth.     DULoxetine (CYMBALTA) 60 MG capsule Take 1 capsule by mouth once daily 90 capsule 0   letrozole (FEMARA) 2.5 MG tablet Take 1 tablet (2.5 mg total) by mouth daily. 30 tablet 5   lisinopril-hydrochlorothiazide (ZESTORETIC) 20-25 MG tablet Take 1 tablet by mouth daily. 90 tablet 1   multivitamin-iron-minerals-folic acid (CENTRUM) chewable tablet Chew 1 tablet by mouth daily.     omeprazole (PRILOSEC) 40 MG capsule Take 1 capsule by mouth once daily (Patient taking differently: Takes every other day) 90 capsule 0   No current facility-administered medications for this visit.     PHYSICAL EXAMINATION: ECOG PERFORMANCE STATUS: 0 - Asymptomatic Vitals:   09/04/21 1321  BP: 139/89  Pulse: 79  Resp: 16  Temp: 97.6 F (36.4 C)  SpO2: 96%   Filed Weights   09/04/21 1321  Weight: 158 lb (71.7 kg)    Physical Exam Constitutional:      General: She is not in acute distress. HENT:     Head: Normocephalic and  atraumatic.  Eyes:     General: No scleral icterus.    Pupils: Pupils are equal, round, and reactive to light.  Cardiovascular:     Rate and Rhythm: Normal rate and regular rhythm.     Heart sounds: Normal heart sounds.  Pulmonary:     Effort: Pulmonary effort is normal. No respiratory distress.     Breath sounds: No wheezing.  Abdominal:     General: Bowel sounds are normal. There is no distension.     Palpations: Abdomen is soft. There is no mass.     Tenderness: There is no abdominal tenderness.  Musculoskeletal:        General: No deformity. Normal range of motion.     Cervical back: Normal range of motion and neck supple.  Skin:    General: Skin is warm and dry.     Findings: No erythema or rash.  Neurological:     Mental Status: She is alert and oriented to person, place, and time. Mental status is at baseline.     Cranial Nerves: No cranial nerve deficit.     Coordination: Coordination normal.  Psychiatric:        Mood and Affect: Mood normal.  Bilateral mastectomy with implant Breast exam is performed in seated and lying down position. Patient is status post bilateral mastectomy with implants.  The edges are intact and there is no evidence of any chest wall recurrence.  No palpable bilateral axillary adenopathy  No focal bruising of right breast or right flank area.  LABORATORY DATA:  I have reviewed the data as listed Lab Results  Component Value Date   WBC 9.6 09/04/2021   HGB 14.2 09/04/2021   HCT 42.1 09/04/2021   MCV 93.3 09/04/2021   PLT 307 09/04/2021   Recent Labs    01/09/21 1042 03/05/21 1411 09/04/21 1257  NA 138 137 135  K 4.9 3.8 4.3  CL 97 99 100  CO2 _0 GLUCOSE 122* 128* 123*  BUN _1 CREATININE 0.64 0.58 0.71  CALCIUM 9.6 9.4 9.1  GFRNONAA  --  >60 >60  PROT 7.3 7.7 7.8  ALBUMIN 4.8 4.4 4.4  AST _2 ALT _3 ALKPHOS 103 90 76  BILITOT 0.3 0.4 0.3    Iron/TIBC/Ferritin/ %Sat No results found for: IRON, TIBC,  FERRITIN, IRONPCTSAT   RADIOGRAPHIC STUDIES:  I have personally reviewed the radiological images as listed and agreed with the findings in the report. No results found.    ASSESSMENT & PLAN:  1. Malignant neoplasm of lower-outer quadrant of right breast of female, estrogen receptor positive ([place])   2. Family history of pancreatic cancer   3. BRCA2 gene mutation positive in female   4. Aromatase inhibitor use    Stage IB pT2 pN0 M0 ER + PR+ HER2 negative right breast cancer, grade 3, +LVI,  Bilateral mastectomy and reconstruction [01/14/2019]  -BRCA2 positive Labs reviewed and discussed with patient. Currently on Arimidex 1 mg daily. Fatigue, unknown etiology.  Patient is interested in switching to other aromatase inhibitors. Stop Arimidex, start letrozole 2.5 mg daily.  Prescription sent to pharmacy.  # Osteoporosis. Continue calcium and vitamin D supplementation.  Zometa every 6 months-proceed with Zometa today..  Patient prefers to stay on aromatase inhibitor rather than switch to tamoxifen.Recommend exercise as tolerated.  Repeat DEXA.  #BRCA2 gene mutation positive,status post hysterectomy with bilateral salpingo-oophorectomy. Annual dermatology evaluation.She has a family history of pancreatic cancer.  Will obtain annual MRI abdomen with and without contrast in December 2023.  #Status post fall, right breast/frank soreness.  Discussed about x-ray for evaluation.   Patient defers x-ray and would like to observe her symptoms.    All questions were answered. The patient knows to call the clinic with any problems questions or concerns. Follow-up in 6 months.   Earlie Server, MD, PhD 09/05/2021

## 2021-09-13 ENCOUNTER — Ambulatory Visit: Payer: Self-pay | Admitting: *Deleted

## 2021-09-13 NOTE — Telephone Encounter (Signed)
Reason for Disposition  [1] Chest wall swelling or pain AND [2] present > 7 days  Answer Assessment - Initial Assessment Questions 1. MECHANISM: "How did the injury happen?"     My ribs are hurting.   I was closing up to go to bed.   I caught my foot on the chair and fell and hit m right rib cage on wing backed chair. I had oncology appt the next day.   She listened to my lungs.   It could be a deep tissue bruising.  2. ONSET: "When did the injury happen?" (Minutes or hours ago)     June 1 3. LOCATION: "Where on the chest is the injury located?"     Right under my breast and around the side 4. APPEARANCE: "What does the injury look like?"     No bruising on outside or swelling but inside it's painful. 5. BLEEDING: "Is there any bleeding now? If Yes, ask: How long has it been bleeding?"     No cuts 6. SEVERITY: "Any difficulty with breathing?"     I can take a deep breath but it hurts inside 7. SIZE: For cuts, bruises, or swelling, ask: "How large is it?" (e.g., inches or centimeters)     No cuts 8. PAIN: "Is there pain?" If Yes, ask: "How bad is the pain?"   (e.g., Scale 1-10; or mild, moderate, severe)     Yes 9. TETANUS: For any breaks in the skin, ask: "When was the last tetanus booster?"     N/A 10. PREGNANCY: "Is there any chance you are pregnant?" "When was your last menstrual period?"       N/A  Protocols used: Chest Injury-A-AH

## 2021-09-13 NOTE — Telephone Encounter (Signed)
  Chief Complaint: Right rib pain after falling onto a wing back chair on June 1st. Symptoms: Soreness and pain under right breast and around the side Frequency: all the time Pertinent Negatives: Patient denies bruises or shortness of breath Disposition: '[]'$ ED /'[]'$ Urgent Care (no appt availability in office) / '[x]'$ Appointment(In office/virtual)/ '[]'$  [place] [facility]/ '[]'$ Home Care/ '[]'$ Refused Recommended Disposition /'[]'$ [place]/ '[]'$  Follow-up with PCP Additional Notes: Appt made with Dr. Army Melia 09/14/2021 at 2:00.

## 2021-09-14 ENCOUNTER — Ambulatory Visit: Payer: BC Managed Care – PPO | Admitting: Internal Medicine

## 2021-09-14 ENCOUNTER — Ambulatory Visit
Admission: RE | Admit: 2021-09-14 | Discharge: 2021-09-14 | Disposition: A | Discharge status: Home / Self Care | Payer: BC Managed Care – PPO | Source: Ambulatory Visit | Attending: Internal Medicine | Admitting: Internal Medicine

## 2021-09-14 ENCOUNTER — Ambulatory Visit
Admission: RE | Admit: 2021-09-14 | Discharge: 2021-09-14 | Disposition: A | Discharge status: Home / Self Care | Payer: BC Managed Care – PPO | Attending: Internal Medicine | Admitting: Internal Medicine

## 2021-09-14 ENCOUNTER — Encounter: Payer: Self-pay | Admitting: Internal Medicine

## 2021-09-14 VITALS — BP 132/84 | HR 94 | Ht 65.0 in | Wt 156.0 lb

## 2021-09-14 DIAGNOSIS — R0781 Pleurodynia: Secondary | ICD-10-CM | POA: Insufficient documentation

## 2021-09-14 NOTE — Progress Notes (Signed)
Date:  09/14/2021   Name:  Cheryl Mooney   DOB:  01-24-1966   MRN:  [id number]   Chief Complaint: Flank Pain (X10 days, Rib pain after fall, right side below breast, feels like its swollen inside, hurts when inhaling and exhaling, no bruising )  Chest Pain  This is a new problem. The current episode started in the past 7 days (fell onto ribs on 09/06/21.). The problem occurs constantly. The pain is present in the lateral region. Pertinent negatives include no cough, dizziness, fever, headaches, palpitations or shortness of breath.  She has used tylenol and ice with some benefit.  She has been taking some deep breaths every hour and is not short of breath.  No fever or cough.  No bruising but very tender in one location.  Lab Results  Component Value Date   NA 135 09/04/2021   K 4.3 09/04/2021   CO2 28 09/04/2021   GLUCOSE 123 (H) 09/04/2021   BUN 15 09/04/2021   CREATININE 0.71 09/04/2021   CALCIUM 9.1 09/04/2021   EGFR 103 01/09/2021   GFRNONAA >60 09/04/2021   Lab Results  Component Value Date   CHOL 246 (H) 01/09/2021   HDL 53 01/09/2021   LDLCALC 166 (H) 01/09/2021   TRIG 151 (H) 01/09/2021   CHOLHDL 4.6 (H) 01/09/2021   Lab Results  Component Value Date   TSH 1.400 01/09/2021   Lab Results  Component Value Date   HGBA1C 6.4 (H) 07/10/2021   Lab Results  Component Value Date   WBC 9.6 09/04/2021   HGB 14.2 09/04/2021   HCT 42.1 09/04/2021   MCV 93.3 09/04/2021   PLT 307 09/04/2021   Lab Results  Component Value Date   ALT 22 09/04/2021   AST 23 09/04/2021   ALKPHOS 76 09/04/2021   BILITOT 0.3 09/04/2021   Lab Results  Component Value Date   VD25OH 35.4 07/10/2021     Review of Systems  Constitutional:  Negative for chills, fatigue and fever.  Respiratory:  Negative for cough, chest tightness and shortness of breath.   Cardiovascular:  Positive for chest pain. Negative for palpitations and leg swelling.  Neurological:  Negative for dizziness,  light-headedness and headaches.    Patient Active Problem List   Diagnosis Date Noted   S/P total hysterectomy and bilateral salpingo-oophorectomy 01/09/2021   Osteoporosis 01/09/2021   Breast asymmetry following reconstructive surgery 02/22/2020   Vitamin D deficiency 11/26/2019   Prediabetes 11/26/2019   S/P breast reconstruction, bilateral 11/07/2019   Carrier of high risk cancer gene mutation    Personal history of colonic polyps    Polyp of sigmoid colon    Acquired absence of bilateral breasts and nipples 07/06/2019   Goals of care, counseling/discussion 03/26/2019   Hyponatremia 03/08/2019   Constipation 03/08/2019   Chest wall pain following surgery 03/08/2019   Breast cancer ([place]) 01/14/2019   BRCA2 gene mutation positive in female 12/30/2018   Family history of pancreatic cancer    Family history of lung cancer    Family history of BRCA2 gene positive    Family history of colon cancer    Malignant neoplasm of lower-outer quadrant of right breast of female, estrogen receptor positive ([place]) 12/17/2018   Hyperlipidemia, mixed 11/19/2016   Tubular adenoma of colon 02/21/2015   Tobacco use disorder 11/27/2014   Essential hypertension 11/27/2014   Hx of abnormal cervical Pap smear 09/07/2011    No Known Allergies  Past Surgical History:  Procedure Laterality  Date   AXILLARY SENTINEL NODE BIOPSY Bilateral 01/14/2019   Procedure: AXILLARY SENTINEL NODE BIOPSY;  Surgeon: Herbert Pun, MD;  Location: [facility];  Service: General;  Laterality: Bilateral;   BREAST BIOPSY Right 12/08/2018   [place] bx of mass with calcs path pending   BREAST BIOPSY Right 12/08/2018   [place] bx of LN, path pending   BREAST RECONSTRUCTION WITH PLACEMENT OF TISSUE EXPANDER AND ALLODERM Bilateral 01/14/2019   Procedure: BILATERAL BREAST RECONSTRUCTION WITH PLACEMENT OF TISSUE EXPANDER AND FLEX HD;  Surgeon: Wallace Going, DO;  Location: [facility];  Service: Plastics;  Laterality: Bilateral;    COLONOSCOPY WITH PROPOFOL N/A 02/17/2015   Procedure: COLONOSCOPY WITH PROPOFOL;  Surgeon: Lucilla Lame, MD;  Location: [place];  Service: Endoscopy;  Laterality: N/A;   COLONOSCOPY WITH PROPOFOL N/A 07/12/2019   Procedure: COLONOSCOPY WITH BIOPSY;  Surgeon: Lucilla Lame, MD;  Location: [place];  Service: Endoscopy;  Laterality: N/A;  priority 3 PORT A CATH NEEDS ACCESSED   CYSTOSCOPY N/A 08/05/2019   Procedure: CYSTOSCOPY;  Surgeon: Homero Fellers, MD;  Location: [facility];  Service: Gynecology;  Laterality: N/A;   LIPOSUCTION WITH LIPOFILLING Bilateral 03/16/2020   Procedure: Bilateral Lipofilling of breasts for symmetry;  Surgeon: Wallace Going, DO;  Location: [place];  Service: Plastics;  Laterality: Bilateral;  1.5 hours   POLYPECTOMY  02/17/2015   Procedure: POLYPECTOMY;  Surgeon: Lucilla Lame, MD;  Location: [place];  Service: Endoscopy;;   POLYPECTOMY N/A 07/12/2019   Procedure: POLYPECTOMY;  Surgeon: Lucilla Lame, MD;  Location: [place];  Service: Endoscopy;  Laterality: N/A;   PORTA CATH REMOVAL Left 10/13/2019   Procedure: PORTA CATH REMOVAL;  Surgeon: Wallace Going, DO;  Location: [place];  Service: Plastics;  Laterality: Left;   PORTACATH PLACEMENT Left 02/18/2019   Procedure: INSERTION PORT-A-CATH;  Surgeon: Herbert Pun, MD;  Location: [facility];  Service: General;  Laterality: Left;   REMOVAL OF BILATERAL TISSUE EXPANDERS WITH PLACEMENT OF BILATERAL BREAST IMPLANTS Bilateral 10/13/2019   Procedure: REMOVAL OF BILATERAL TISSUE EXPANDERS WITH PLACEMENT OF BILATERAL BREAST IMPLANTS;  Surgeon: Wallace Going, DO;  Location: [place];  Service: Plastics;  Laterality: Bilateral;  2 hours please   SIMPLE MASTECTOMY WITH AXILLARY SENTINEL NODE BIOPSY Left 01/14/2019   Procedure: LEFT SIMPLE MASTECTOMY;  Surgeon: Herbert Pun, MD;  Location: [facility];  Service: General;  Laterality: Left;   TOTAL LAPAROSCOPIC HYSTERECTOMY WITH BILATERAL SALPINGO OOPHORECTOMY Bilateral 08/05/2019   Procedure: TOTAL LAPAROSCOPIC HYSTERECTOMY WITH BILATERAL SALPINGO OOPHORECTOMY;  Surgeon: Homero Fellers, MD;  Location: [facility];  Service: Gynecology;  Laterality: Bilateral;   TOTAL MASTECTOMY Right 01/14/2019   Procedure: RIGHT TOTAL MASTECTOMY;  Surgeon: Herbert Pun, MD;  Location: [facility];  Service: General;  Laterality: Right;   WISDOM TOOTH EXTRACTION      Social History   Tobacco Use   Smoking status: Former    Packs/day: 0.05    Years: 20.00    Total pack years: 1.00    Types: Cigarettes   Smokeless tobacco: Never  Vaping Use   Vaping Use: Never used  Substance Use Topics   Alcohol use: Yes    Alcohol/week: 2.0 standard drinks of alcohol    Types: 2 Standard drinks or equivalent per week    Comment: occasional   Drug use: No     Medication list has been reviewed and updated.  Current Meds  Medication Sig  acetaminophen (TYLENOL) 500 MG tablet Take 2 tablets (1,000 mg total) by mouth every 6 (six) hours as needed for mild pain.   CALCIUM PO Take by mouth daily.   Calcium Polycarbophil 625 MG CHEW Chew by mouth.   cetirizine (ZYRTEC) 10 MG tablet Take 10 mg by mouth daily.   Cholecalciferol (VITAMIN D3 PO) Take by mouth daily.   DULoxetine (CYMBALTA) 60 MG capsule Take 1 capsule by mouth once daily   lisinopril-hydrochlorothiazide (ZESTORETIC) 20-25 MG tablet Take 1 tablet by mouth daily.   multivitamin-iron-minerals-folic acid (CENTRUM) chewable tablet Chew 1 tablet by mouth daily.   omeprazole (PRILOSEC) 40 MG capsule Take 1 capsule by mouth once daily (Patient taking differently: as needed. Takes every other day)   [DISCONTINUED] Cholecalciferol (VITAMIN D3) 100000 UNIT/GM POWD Take by mouth.       09/14/2021    2:07 PM 07/10/2021   10:32 AM 01/09/2021   10:14 AM 05/31/2020    8:49 AM  GAD 7 : Generalized  Anxiety Score  Nervous, Anxious, on Edge 0 0 1 0  Control/stop worrying 0 0 0 0  Worry too much - different things 0 0 1 0  Trouble relaxing 0 0 1 0  Restless 0 0 0 0  Easily annoyed or irritable 0 0 0 0  Afraid - awful might happen 0 0 0 0  Total GAD 7 Score 0 0 3 0  Anxiety Difficulty  Not difficult at all         09/14/2021    2:06 PM  Depression screen PHQ 2/9  Decreased Interest 0  Down, Depressed, Hopeless 0  PHQ - 2 Score 0  Altered sleeping 2  Tired, decreased energy 2  Change in appetite 0  Feeling bad or failure about yourself  0  Trouble concentrating 0  Moving slowly or fidgety/restless 0  Suicidal thoughts 0  PHQ-9 Score 4  Difficult doing work/chores Not difficult at all    BP Readings from Last 3 Encounters:  09/14/21 132/84  09/04/21 139/89  07/10/21 118/70    Physical Exam Vitals and nursing note reviewed.  Constitutional:      General: She is not in acute distress.    Appearance: Normal appearance. She is well-developed.  HENT:     Head: Normocephalic and atraumatic.  Cardiovascular:     Rate and Rhythm: Normal rate and regular rhythm.  Pulmonary:     Effort: Pulmonary effort is normal. No respiratory distress.     Breath sounds: No decreased breath sounds, wheezing or rhonchi.  Chest:     Chest wall: Tenderness present. No swelling, crepitus or edema.       Comments: Possible rib step-off at the site of maximum discomfort. Skin:    General: Skin is warm and dry.     Findings: No rash.  Neurological:     Mental Status: She is alert and oriented to person, place, and time.  Psychiatric:        Mood and Affect: Mood normal.        Behavior: Behavior normal.     Wt Readings from Last 3 Encounters:  09/14/21 156 lb (70.8 kg)  09/04/21 158 lb (71.7 kg)  07/10/21 158 lb 8 oz (71.9 kg)    BP 132/84 (BP Location: Left Arm, Cuff Size: Large)   Pulse 94   Ht _0  (1.651 m)   Wt 156 lb (70.8 kg)   SpO2 97%   BMI 25.96 kg/m    Assessment and Plan:  1. Rib pain on right side S/p fall with suspected fracture Continue ice and Tylenol Deep breathing every hour with external splinting as re-enforced. - DG Ribs Unilateral Right   Partially dictated using Editor, commissioning. Any errors are unintentional.  Halina Maidens, MD [facility]  09/14/2021

## 2021-09-22 ENCOUNTER — Other Ambulatory Visit: Payer: Self-pay | Admitting: Oncology

## 2021-09-22 ENCOUNTER — Other Ambulatory Visit: Payer: Self-pay | Admitting: Nurse Practitioner

## 2021-12-06 ENCOUNTER — Other Ambulatory Visit: Payer: Self-pay | Admitting: Oncology

## 2021-12-11 ENCOUNTER — Other Ambulatory Visit: Payer: Self-pay | Admitting: Oncology

## 2021-12-18 ENCOUNTER — Other Ambulatory Visit: Payer: Self-pay | Admitting: Oncology

## 2021-12-29 ENCOUNTER — Ambulatory Visit
Admission: EM | Admit: 2021-12-29 | Discharge: 2021-12-29 | Disposition: A | Discharge status: Home / Self Care | Payer: BC Managed Care – PPO | Attending: Emergency Medicine | Admitting: Emergency Medicine

## 2021-12-29 DIAGNOSIS — R3915 Urgency of urination: Secondary | ICD-10-CM

## 2021-12-29 DIAGNOSIS — R3914 Feeling of incomplete bladder emptying: Secondary | ICD-10-CM

## 2021-12-29 LAB — URINALYSIS, ROUTINE W REFLEX MICROSCOPIC
Bilirubin Urine: NEGATIVE
Glucose, UA: NEGATIVE mg/dL
Hgb urine dipstick: NEGATIVE
Ketones, ur: NEGATIVE mg/dL
Leukocytes,Ua: NEGATIVE
Nitrite: NEGATIVE
Protein, ur: NEGATIVE mg/dL
Specific Gravity, Urine: 1.01 (ref 1.005–1.030)
pH: 6 (ref 5.0–8.0)

## 2021-12-29 NOTE — Discharge Instructions (Addendum)
As we discussed, there is no evidence of infection on your urinalysis.  The only way to determine if you are truly retaining urine is to have a bladder scan performed on ultrasound of your bladder.  The other way is to have catheterization performed to look for residual urine in your bladder.  None of those are options available to [place] at the urgent care.  If you do have a continuation of symptoms, or your symptoms worsen you need to go to the ER to be evaluated as urinary retention can eventually lead to bladder rupture if your bladder gets too big.  You may continue to try and push fluids and see if you can void normally.  You can also use over-the-counter Azo Standard to help with that feeling of urinary urgency.

## 2021-12-29 NOTE — ED Provider Notes (Signed)
[facility]    CSN: [id number] Arrival date & time: 12/29/21  1322      History   Chief Complaint Chief Complaint  Patient presents with   Urinary Urgency    HPI Cheryl Mooney is a 56 y.o. female.   HPI  56-year-old female here for evaluation of urinary complaints.  Patient reports that her symptoms began 3 days ago and then began with burning at the end of urination.  She states that since that time the burning has largely resolved but she is now feeling like she cannot completely empty her bladder.  She states that when she gets up in the morning she does have a large void but as the days gone on today she just has little dribbles and she feels like her bladder is full.  She is also had some low back pain.  She denies any blood in her urine or fevers.  No abdominal pain.  Past Medical History:  Diagnosis Date   Anemia    things seem back to normal   Benign neoplasm of sigmoid colon    Benign neoplasm of transverse colon    Breast cancer (H[facility])    Depression    Encounter for antineoplastic chemotherapy 12/30/2018   Pathogenic variant in BRCA2 called [id number] identified on the [facility] Breast Cancer STAT Panel. The STAT Breast cancer panel offered by [facility] includes sequencing and rearrangement analysis for the following 9 genes:  ATM, BRCA1, BRCA2, CDH1, CHEK2, PALB2, PTEN, STK11 and TP53.  The report date is 12/30/2018. Common Hereditary Cancer Panel results pending.    Family history of BRCA2 gene positive    Family history of breast cancer    Family history of colon cancer    Family history of lung cancer    Family history of pancreatic cancer    GERD (gastroesophageal reflux disease)    History of kidney stones    Hypertension    Malignant neoplasm of lower-outer quadrant of right breast of female, estrogen receptor positive ([facility]) 12/17/2018   Smoker     Patient Active Problem List   Diagnosis Date Noted   S/P total hysterectomy and bilateral  salpingo-oophorectomy 01/09/2021   Osteoporosis 01/09/2021   Breast asymmetry following reconstructive surgery 02/22/2020   Vitamin D deficiency 11/26/2019   Prediabetes 11/26/2019   S/P breast reconstruction, bilateral 11/07/2019   Carrier of high risk cancer gene mutation    Personal history of colonic polyps    Polyp of sigmoid colon    Acquired absence of bilateral breasts and nipples 07/06/2019   Goals of care, counseling/discussion 03/26/2019   Hyponatremia 03/08/2019   Constipation 03/08/2019   Chest wall pain following surgery 03/08/2019   Breast cancer ([facility]) 01/14/2019   BRCA2 gene mutation positive in female 12/30/2018   Family history of pancreatic cancer    Family history of lung cancer    Family history of BRCA2 gene positive    Family history of colon cancer    Malignant neoplasm of lower-outer quadrant of right breast of female, estrogen receptor positive ([facility]) 12/17/2018   Hyperlipidemia, mixed 11/19/2016   Tubular adenoma of colon 02/21/2015   Tobacco use disorder 11/27/2014   Essential hypertension 11/27/2014   Hx of abnormal cervical Pap smear 09/07/2011    Past Surgical History:  Procedure Laterality Date   AXILLARY SENTINEL NODE BIOPSY Bilateral 01/14/2019   Procedure: AXILLARY SENTINEL NODE BIOPSY;  Surgeon: Cintron-Diaz, Edgardo, MD;  Location: [facility];  Service: General;  Laterality: Bilateral;     BREAST BIOPSY Right 12/08/2018   [place] bx of mass with calcs path pending   BREAST BIOPSY Right 12/08/2018   [place] bx of LN, path pending   BREAST RECONSTRUCTION WITH PLACEMENT OF TISSUE EXPANDER AND ALLODERM Bilateral 01/14/2019   Procedure: BILATERAL BREAST RECONSTRUCTION WITH PLACEMENT OF TISSUE EXPANDER AND FLEX HD;  Surgeon: Wallace Going, DO;  Location: [facility];  Service: Plastics;  Laterality: Bilateral;   COLONOSCOPY WITH PROPOFOL N/A 02/17/2015   Procedure: COLONOSCOPY WITH PROPOFOL;  Surgeon: Lucilla Lame, MD;  Location: [place];   Service: Endoscopy;  Laterality: N/A;   COLONOSCOPY WITH PROPOFOL N/A 07/12/2019   Procedure: COLONOSCOPY WITH BIOPSY;  Surgeon: Lucilla Lame, MD;  Location: [place];  Service: Endoscopy;  Laterality: N/A;  priority 3 PORT A CATH NEEDS ACCESSED   CYSTOSCOPY N/A 08/05/2019   Procedure: CYSTOSCOPY;  Surgeon: Homero Fellers, MD;  Location: [facility];  Service: Gynecology;  Laterality: N/A;   LIPOSUCTION WITH LIPOFILLING Bilateral 03/16/2020   Procedure: Bilateral Lipofilling of breasts for symmetry;  Surgeon: Wallace Going, DO;  Location: [place];  Service: Plastics;  Laterality: Bilateral;  1.5 hours   POLYPECTOMY  02/17/2015   Procedure: POLYPECTOMY;  Surgeon: Lucilla Lame, MD;  Location: [place];  Service: Endoscopy;;   POLYPECTOMY N/A 07/12/2019   Procedure: POLYPECTOMY;  Surgeon: Lucilla Lame, MD;  Location: [place];  Service: Endoscopy;  Laterality: N/A;   PORTA CATH REMOVAL Left 10/13/2019   Procedure: PORTA CATH REMOVAL;  Surgeon: Wallace Going, DO;  Location: [place];  Service: Plastics;  Laterality: Left;   PORTACATH PLACEMENT Left 02/18/2019   Procedure: INSERTION PORT-A-CATH;  Surgeon: Herbert Pun, MD;  Location: [facility];  Service: General;  Laterality: Left;   REMOVAL OF BILATERAL TISSUE EXPANDERS WITH PLACEMENT OF BILATERAL BREAST IMPLANTS Bilateral 10/13/2019   Procedure: REMOVAL OF BILATERAL TISSUE EXPANDERS WITH PLACEMENT OF BILATERAL BREAST IMPLANTS;  Surgeon: Wallace Going, DO;  Location: [place];  Service: Plastics;  Laterality: Bilateral;  2 hours please   SIMPLE MASTECTOMY WITH AXILLARY SENTINEL NODE BIOPSY Left 01/14/2019   Procedure: LEFT SIMPLE MASTECTOMY;  Surgeon: Herbert Pun, MD;  Location: [facility];  Service: General;  Laterality: Left;   TOTAL LAPAROSCOPIC HYSTERECTOMY WITH BILATERAL SALPINGO OOPHORECTOMY Bilateral 08/05/2019    Procedure: TOTAL LAPAROSCOPIC HYSTERECTOMY WITH BILATERAL SALPINGO OOPHORECTOMY;  Surgeon: Homero Fellers, MD;  Location: [facility];  Service: Gynecology;  Laterality: Bilateral;   TOTAL MASTECTOMY Right 01/14/2019   Procedure: RIGHT TOTAL MASTECTOMY;  Surgeon: Herbert Pun, MD;  Location: [facility];  Service: General;  Laterality: Right;   WISDOM TOOTH EXTRACTION      OB History     Gravida  0   Para  0   Term  0   Preterm  0   AB  0   Living  0      SAB  0   IAB  0   Ectopic  0   Multiple  0   Live Births  0            Home Medications    Prior to Admission medications   Medication Sig Start Date End Date Taking? Authorizing Provider  CALCIUM PO Take by mouth daily.   Yes [provider]  Calcium Polycarbophil 625 MG CHEW Chew by mouth.   Yes [provider]  cetirizine (ZYRTEC) 10 MG tablet Take 10 mg by mouth daily.   Yes [provider]  DULoxetine (CYMBALTA) 60 MG capsule Take 1 capsule by mouth once daily 12/18/21  Yes Earlie Server, MD  letrozole [facility]) 2.5 MG tablet Take 1 tablet (2.5 mg total) by mouth daily. 09/04/21  Yes Earlie Server, MD  lisinopril-hydrochlorothiazide (ZESTORETIC) 20-25 MG tablet Take 1 tablet by mouth daily. 07/10/21  Yes Glean Hess, MD  multivitamin-iron-minerals-folic acid (CENTRUM) chewable tablet Chew 1 tablet by mouth daily.   Yes [provider]  omeprazole (PRILOSEC) 40 MG capsule Take 1 capsule by mouth once daily Patient taking differently: as needed. Takes every other day 08/17/20  Yes Glean Hess, MD  acetaminophen (TYLENOL) 500 MG tablet Take 2 tablets (1,000 mg total) by mouth every 6 (six) hours as needed for mild pain. 08/05/19   Schuman, Stefanie Libel, MD  Cholecalciferol (VITAMIN D3 PO) Take by mouth daily.    [provider]    Family History Family History  Problem Relation Age of Onset   Colon cancer Mother 61   Breast cancer Mother 95       "BRCA2+"    Hypertension Father    Stroke Father    Alzheimer's disease Father    Prostate cancer Father    Breast cancer Maternal Aunt        mat great aunt   Breast cancer Maternal Grandmother 58   Pancreatic cancer Maternal Grandfather 64    Social History Social History   Tobacco Use   Smoking status: Former    Packs/day: 0.05    Years: 20.00    Total pack years: 1.00    Types: Cigarettes   Smokeless tobacco: Never  Vaping Use   Vaping Use: Never used  Substance Use Topics   Alcohol use: Yes    Alcohol/week: 2.0 standard drinks of alcohol    Types: 2 Standard drinks or equivalent per week    Comment: occasional   Drug use: No     Allergies   Patient has no known allergies.   Review of Systems Review of Systems  Gastrointestinal:  Positive for abdominal distention.  Genitourinary:  Positive for difficulty urinating, dysuria and urgency. Negative for frequency and hematuria.  Musculoskeletal:  Positive for back pain.     Physical Exam Triage Vital Signs ED Triage Vitals  Enc Vitals Group     BP 12/29/21 1338 (!) 136/92     Pulse Rate 12/29/21 1338 81     Resp --      Temp 12/29/21 1338 97.7 F (36.5 C)     Temp Source 12/29/21 1338 Oral     SpO2 12/29/21 1338 98 %     Weight 12/29/21 1335 154 lb (69.9 kg)     Height 12/29/21 1335 5' 5" (1.651 m)     Head Circumference --      Peak Flow --      Pain Score 12/29/21 1335 0     Pain Loc --      Pain Edu? --      Excl. in [place]? --    No data found.  Updated Vital Signs BP (!) 136/92 (BP Location: Left Arm)   Pulse 81   Temp 97.7 F (36.5 C) (Oral)   Ht 5' 5" (1.651 m)   Wt 154 lb (69.9 kg)   SpO2 98%   BMI 25.63 kg/m   Visual Acuity Right Eye Distance:   Left Eye Distance:   Bilateral Distance:    Right Eye Near:   Left Eye Near:  Bilateral Near:     Physical Exam Vitals and nursing note reviewed.  Constitutional:      Appearance: Normal appearance. She is not ill-appearing.  HENT:      Head: Normocephalic and atraumatic.  Cardiovascular:     Rate and Rhythm: Normal rate and regular rhythm.     Pulses: Normal pulses.     Heart sounds: Normal heart sounds. No murmur heard.    No friction rub. No gallop.  Pulmonary:     Effort: Pulmonary effort is normal.     Breath sounds: Normal breath sounds. No wheezing, rhonchi or rales.  Skin:    General: Skin is warm and dry.     Capillary Refill: Capillary refill takes less than 2 seconds.     Findings: No erythema or rash.  Neurological:     General: No focal deficit present.     Mental Status: She is alert and oriented to person, place, and time.  Psychiatric:        Mood and Affect: Mood normal.        Behavior: Behavior normal.        Thought Content: Thought content normal.        Judgment: Judgment normal.      UC Treatments / Results  Labs (all labs ordered are listed, but only abnormal results are displayed) Labs Reviewed  URINALYSIS, ROUTINE W REFLEX MICROSCOPIC    EKG   Radiology No results found.  Procedures Procedures (including critical care time)  Medications Ordered in UC Medications - No data to display  Initial Impression / Assessment and Plan / UC Course  I have reviewed the triage vital signs and the nursing notes.  Pertinent labs & imaging results that were available during my care of the patient were reviewed by me and considered in my medical decision making (see chart for details).   Patient is a nontoxic-appearing 13-year-old female here for evaluation of 3 days worth of urinary complaints that started off with burning at the end of urination and has continued as urinary urgency.  Today she states that she feels like she is not able to empty her bladder and every time she goes to the bathroom she just passes little dribbles.  To this point she has been able to void but now she feels like her bladder is distended.  She has not had a fever and she denies any blood in her urine.  On exam  patient is a benign cardiopulmonary exam with S1-S2 heart sounds with a rate and rhythm and lung sounds are clear to auscultation all fields.  No CVA tenderness on exam.  Abdomen is flat and soft.  No tenderness of the suprapubic region and I do not palpate the patient's bladder.  I will order urinalysis to look for the presence of infection.  Patient's urinalysis is completely unremarkable.  There is no leukocyte esterase, nitrates, protein, ketones, hemoglobin, or glucose.  pH is normal at 6.0.  Specific gravity is 1.010.  I discussed the findings with the patient and advised her that the only way to tell if she is truly having urinary retention is to either do an ultrasound of her bladder, do a bladder scan, or do catheterization for postvoid residual, and none of those are things that we can do in the urgent care.  She states that she wants to "ride it out".  I have advised her to continue taking in oral fluids and significant flush her urinary tract.  She can   also use over-the-counter Azo Standard to see if it helps with some of the urgency that she is feeling.  If she continues to do well up abdominal distention or bladder pressure.  She continues to have small volume voids then she needs to go to the emergency department for evaluation.  Patient verbalizes understanding.   Final Clinical Impressions(s) / UC Diagnoses   Final diagnoses:  Urinary urgency  Feeling of incomplete bladder emptying     Discharge Instructions      As we discussed, there is no evidence of infection on your urinalysis.  The only way to determine if you are truly retaining urine is to have a bladder scan performed on ultrasound of your bladder.  The other way is to have catheterization performed to look for residual urine in your bladder.  None of those are options available to us at the urgent care.  If you do have a continuation of symptoms, or your symptoms worsen you need to go to the ER to be evaluated as  urinary retention can eventually lead to bladder rupture if your bladder gets too big.  You may continue to try and push fluids and see if you can void normally.  You can also use over-the-counter Azo Standard to help with that feeling of urinary urgency.     ED Prescriptions   None    PDMP not reviewed this encounter.   Ryan, Jeremy, NP 12/29/21 1406  

## 2021-12-29 NOTE — ED Triage Notes (Signed)
Pt c/o urinary urgency, pt states she feels like she can't empty bladder onset Wednesday night. Pt states she had a little burning on Wednesday night, denies any known fevers, no chills

## 2022-01-08 ENCOUNTER — Other Ambulatory Visit: Payer: Self-pay | Admitting: Internal Medicine

## 2022-01-08 DIAGNOSIS — I10 Essential (primary) hypertension: Secondary | ICD-10-CM

## 2022-01-08 NOTE — Telephone Encounter (Signed)
Future visit in 3 days . Requested Prescriptions  Pending Prescriptions Disp Refills  . lisinopril-hydrochlorothiazide (ZESTORETIC) 20-25 MG tablet [Pharmacy Med Name: Lisinopril-hydroCHLOROthiazide 20-25 MG Oral Tablet] 90 tablet 0    Sig: Take 1 tablet by mouth once daily     Cardiovascular:  ACEI + Diuretic Combos Failed - 01/08/2022  1:30 PM      Failed - Last BP in normal range    BP Readings from Last 1 Encounters:  12/29/21 (!) 136/92         Passed - Na in normal range and within 180 days    Sodium  Date Value Ref Range Status  09/04/2021 135 135 - 145 mmol/L Final  01/09/2021 138 134 - 144 mmol/L Final         Passed - K in normal range and within 180 days    Potassium  Date Value Ref Range Status  09/04/2021 4.3 3.5 - 5.1 mmol/L Final         Passed - Cr in normal range and within 180 days    Creatinine, Ser  Date Value Ref Range Status  09/04/2021 0.71 0.44 - 1.00 mg/dL Final         Passed - eGFR is 30 or above and within 180 days    GFR calc Af Amer  Date Value Ref Range Status  10/06/2019 >60 >60 mL/min Final   GFR, Estimated  Date Value Ref Range Status  09/04/2021 >60 >60 mL/min Final    Comment:    (NOTE) Calculated using the CKD-EPI Creatinine Equation (2021)    eGFR  Date Value Ref Range Status  01/09/2021 103 >59 mL/min/1.73 Final         Passed - Patient is not pregnant      Passed - Valid encounter within last 6 months    Recent Outpatient Visits          3 months ago Rib pain on right side   [place] [facility], Jesse Sans, MD   6 months ago Essential hypertension   [place] [facility] at [facility], Jesse Sans, MD   12 months ago Annual physical exam   [place] [facility], Jesse Sans, MD   1 year ago Essential hypertension   [place] [facility] at [facility], Jesse Sans, MD   2 years ago Annual physical exam   [facility] at [facility], Jesse Sans, MD      Future Appointments            In 3 days Army Melia Jesse Sans, MD [place] [facility] at [facility], [facility]

## 2022-01-11 ENCOUNTER — Ambulatory Visit (INDEPENDENT_AMBULATORY_CARE_PROVIDER_SITE_OTHER): Payer: BC Managed Care – PPO | Admitting: Internal Medicine

## 2022-01-11 ENCOUNTER — Encounter: Payer: Self-pay | Admitting: Internal Medicine

## 2022-01-11 VITALS — BP 128/76 | HR 72 | Ht 65.0 in | Wt 157.0 lb

## 2022-01-11 DIAGNOSIS — E782 Mixed hyperlipidemia: Secondary | ICD-10-CM | POA: Diagnosis not present

## 2022-01-11 DIAGNOSIS — M810 Age-related osteoporosis without current pathological fracture: Secondary | ICD-10-CM

## 2022-01-11 DIAGNOSIS — R7303 Prediabetes: Secondary | ICD-10-CM | POA: Diagnosis not present

## 2022-01-11 DIAGNOSIS — Z0000 Encounter for general adult medical examination without abnormal findings: Secondary | ICD-10-CM

## 2022-01-11 DIAGNOSIS — Z170 Estrogen receptor positive status [ER+]: Secondary | ICD-10-CM

## 2022-01-11 DIAGNOSIS — I10 Essential (primary) hypertension: Secondary | ICD-10-CM | POA: Diagnosis not present

## 2022-01-11 DIAGNOSIS — K219 Gastro-esophageal reflux disease without esophagitis: Secondary | ICD-10-CM | POA: Insufficient documentation

## 2022-01-11 DIAGNOSIS — C50511 Malignant neoplasm of lower-outer quadrant of right female breast: Secondary | ICD-10-CM

## 2022-01-11 MED ORDER — LISINOPRIL-HYDROCHLOROTHIAZIDE 20-25 MG PO TABS: 1.0000 | ORAL_TABLET | Freq: Every day | ORAL | 3 refills | Status: DC

## 2022-01-11 NOTE — Progress Notes (Addendum)
Date:  01/11/2022   Name:  Cheryl Mooney   DOB:  1966-02-09   MRN:  [id number]   Chief Complaint: Annual Exam Cheryl Mooney is a 56 y.o. female who presents today for her Complete Annual Exam. She feels fairly well. She reports exercising. She reports she is sleeping fairly well.   Mammogram: discontinued DEXA: 02/2020 osteoporosis - scheduled for 12/15 Pap smear: discontinued Colonoscopy: 07/2019 repeat 5 yrs.  Health Maintenance Due  Topic Date Due   HIV Screening  Never done   TETANUS/TDAP  09/19/2021   INFLUENZA VACCINE  11/06/2021    Immunization History  Administered Date(s) Administered   Influenza,inj,Quad PF,6+ Mos 12/20/2014, 05/31/2020, 01/09/2021   Influenza,inj,quad, With Preservative 02/09/2019   Influenza-Unspecified 02/09/2019   Moderna Sars-Covid-2 Vaccination 06/26/2019, 07/25/2019   PNEUMOCOCCAL CONJUGATE-20 07/10/2021   PPD Test 11/18/2016   Tdap 09/20/2011   Zoster Recombinat (Shingrix) 02/22/2019, 08/23/2019    Hypertension This is a chronic problem. The problem is controlled. Associated symptoms include chest pain (chest wall post surgical pain). Pertinent negatives include no headaches, palpitations or shortness of breath. Past treatments include ACE inhibitors and diuretics. The current treatment provides significant improvement. There is no history of kidney disease, CAD/MI or CVA.    Lab Results  Component Value Date   NA 135 09/04/2021   K 4.3 09/04/2021   CO2 28 09/04/2021   GLUCOSE 123 (H) 09/04/2021   BUN 15 09/04/2021   CREATININE 0.71 09/04/2021   CALCIUM 9.1 09/04/2021   EGFR 103 01/09/2021   GFRNONAA >60 09/04/2021   Lab Results  Component Value Date   CHOL 246 (H) 01/09/2021   HDL 53 01/09/2021   LDLCALC 166 (H) 01/09/2021   TRIG 151 (H) 01/09/2021   CHOLHDL 4.6 (H) 01/09/2021   Lab Results  Component Value Date   TSH 1.400 01/09/2021   Lab Results  Component Value Date   HGBA1C 6.4 (H) 07/10/2021   Lab Results   Component Value Date   WBC 9.6 09/04/2021   HGB 14.2 09/04/2021   HCT 42.1 09/04/2021   MCV 93.3 09/04/2021   PLT 307 09/04/2021   Lab Results  Component Value Date   ALT 22 09/04/2021   AST 23 09/04/2021   ALKPHOS 76 09/04/2021   BILITOT 0.3 09/04/2021   Lab Results  Component Value Date   VD25OH 35.4 07/10/2021     Review of Systems  Constitutional:  Negative for chills, fatigue and fever.  HENT:  Negative for congestion, hearing loss, tinnitus, trouble swallowing and voice change.   Eyes:  Negative for visual disturbance.  Respiratory:  Negative for cough, chest tightness, shortness of breath and wheezing.   Cardiovascular:  Positive for chest pain (chest wall post surgical pain). Negative for palpitations and leg swelling.  Gastrointestinal:  Negative for abdominal pain, constipation, diarrhea and vomiting.  Endocrine: Negative for polydipsia and polyuria.  Genitourinary:  Positive for dysuria (with recent normal urine - may be lack of estrogen). Negative for frequency, genital sores, vaginal bleeding and vaginal discharge.  Musculoskeletal:  Positive for arthralgias. Negative for gait problem and joint swelling.  Skin:  Negative for color change and rash.  Neurological:  Negative for dizziness, tremors, light-headedness and headaches.  Hematological:  Negative for adenopathy. Does not bruise/bleed easily.  Psychiatric/Behavioral:  Negative for dysphoric mood and sleep disturbance. The patient is not nervous/anxious.     Patient Active Problem List   Diagnosis Date Noted   S/P total hysterectomy and bilateral salpingo-oophorectomy  01/09/2021   Osteoporosis 01/09/2021   Breast asymmetry following reconstructive surgery 02/22/2020   Vitamin D deficiency 11/26/2019   Prediabetes 11/26/2019   S/P breast reconstruction, bilateral 11/07/2019   Carrier of high risk cancer gene mutation    Personal history of colonic polyps    Polyp of sigmoid colon    Acquired absence of  bilateral breasts and nipples 07/06/2019   Goals of care, counseling/discussion 03/26/2019   Hyponatremia 03/08/2019   Constipation 03/08/2019   Chest wall pain following surgery 03/08/2019   BRCA2 gene mutation positive in female 12/30/2018   Family history of pancreatic cancer    Family history of lung cancer    Family history of BRCA2 gene positive    Family history of colon cancer    Malignant neoplasm of lower-outer quadrant of right breast of female, estrogen receptor positive ([place]) 12/17/2018   Hyperlipidemia, mixed 11/19/2016   Tubular adenoma of colon 02/21/2015   Tobacco use disorder 11/27/2014   Essential hypertension 11/27/2014   Hx of abnormal cervical Pap smear 09/07/2011    No Known Allergies  Past Surgical History:  Procedure Laterality Date   AXILLARY SENTINEL NODE BIOPSY Bilateral 01/14/2019   Procedure: AXILLARY SENTINEL NODE BIOPSY;  Surgeon: Herbert Pun, MD;  Location: [facility];  Service: General;  Laterality: Bilateral;   BREAST BIOPSY Right 12/08/2018   [place] bx of mass with calcs path pending   BREAST BIOPSY Right 12/08/2018   [place] bx of LN, path pending   BREAST RECONSTRUCTION WITH PLACEMENT OF TISSUE EXPANDER AND ALLODERM Bilateral 01/14/2019   Procedure: BILATERAL BREAST RECONSTRUCTION WITH PLACEMENT OF TISSUE EXPANDER AND FLEX HD;  Surgeon: Wallace Going, DO;  Location: [facility];  Service: Plastics;  Laterality: Bilateral;   COLONOSCOPY WITH PROPOFOL N/A 02/17/2015   Procedure: COLONOSCOPY WITH PROPOFOL;  Surgeon: Lucilla Lame, MD;  Location: [place];  Service: Endoscopy;  Laterality: N/A;   COLONOSCOPY WITH PROPOFOL N/A 07/12/2019   Procedure: COLONOSCOPY WITH BIOPSY;  Surgeon: Lucilla Lame, MD;  Location: [place];  Service: Endoscopy;  Laterality: N/A;  priority 3 PORT A CATH NEEDS ACCESSED   CYSTOSCOPY N/A 08/05/2019   Procedure: CYSTOSCOPY;  Surgeon: Homero Fellers, MD;  Location: [facility];  Service:  Gynecology;  Laterality: N/A;   LIPOSUCTION WITH LIPOFILLING Bilateral 03/16/2020   Procedure: Bilateral Lipofilling of breasts for symmetry;  Surgeon: Wallace Going, DO;  Location: [place];  Service: Plastics;  Laterality: Bilateral;  1.5 hours   POLYPECTOMY  02/17/2015   Procedure: POLYPECTOMY;  Surgeon: Lucilla Lame, MD;  Location: [place];  Service: Endoscopy;;   POLYPECTOMY N/A 07/12/2019   Procedure: POLYPECTOMY;  Surgeon: Lucilla Lame, MD;  Location: [place];  Service: Endoscopy;  Laterality: N/A;   PORTA CATH REMOVAL Left 10/13/2019   Procedure: PORTA CATH REMOVAL;  Surgeon: Wallace Going, DO;  Location: [place];  Service: Plastics;  Laterality: Left;   PORTACATH PLACEMENT Left 02/18/2019   Procedure: INSERTION PORT-A-CATH;  Surgeon: Herbert Pun, MD;  Location: [facility];  Service: General;  Laterality: Left;   REMOVAL OF BILATERAL TISSUE EXPANDERS WITH PLACEMENT OF BILATERAL BREAST IMPLANTS Bilateral 10/13/2019   Procedure: REMOVAL OF BILATERAL TISSUE EXPANDERS WITH PLACEMENT OF BILATERAL BREAST IMPLANTS;  Surgeon: Wallace Going, DO;  Location: [place];  Service: Plastics;  Laterality: Bilateral;  2 hours please   SIMPLE MASTECTOMY WITH AXILLARY SENTINEL NODE BIOPSY Left 01/14/2019   Procedure: LEFT SIMPLE MASTECTOMY;  Surgeon:  Herbert Pun, MD;  Location: [facility];  Service: General;  Laterality: Left;   TOTAL LAPAROSCOPIC HYSTERECTOMY WITH BILATERAL SALPINGO OOPHORECTOMY Bilateral 08/05/2019   Procedure: TOTAL LAPAROSCOPIC HYSTERECTOMY WITH BILATERAL SALPINGO OOPHORECTOMY;  Surgeon: Homero Fellers, MD;  Location: [facility];  Service: Gynecology;  Laterality: Bilateral;   TOTAL MASTECTOMY Right 01/14/2019   Procedure: RIGHT TOTAL MASTECTOMY;  Surgeon: Herbert Pun, MD;  Location: [facility];  Service: General;  Laterality: Right;   WISDOM TOOTH EXTRACTION       Social History   Tobacco Use   Smoking status: Former    Packs/day: 0.05    Years: 20.00    Total pack years: 1.00    Types: Cigarettes   Smokeless tobacco: Never  Vaping Use   Vaping Use: Never used  Substance Use Topics   Alcohol use: Yes    Alcohol/week: 2.0 standard drinks of alcohol    Types: 2 Standard drinks or equivalent per week    Comment: occasional   Drug use: No     Medication list has been reviewed and updated.  Current Meds  Medication Sig   acetaminophen (TYLENOL) 500 MG tablet Take 2 tablets (1,000 mg total) by mouth every 6 (six) hours as needed for mild pain.   CALCIUM PO Take by mouth daily.   Calcium Polycarbophil 625 MG CHEW Chew by mouth.   cetirizine (ZYRTEC) 10 MG tablet Take 10 mg by mouth daily.   Cholecalciferol (VITAMIN D3 PO) Take by mouth daily.   DULoxetine (CYMBALTA) 60 MG capsule Take 1 capsule by mouth once daily   letrozole (FEMARA) 2.5 MG tablet Take 1 tablet (2.5 mg total) by mouth daily.   lisinopril-hydrochlorothiazide (ZESTORETIC) 20-25 MG tablet Take 1 tablet by mouth once daily   multivitamin-iron-minerals-folic acid (CENTRUM) chewable tablet Chew 1 tablet by mouth daily.   omeprazole (PRILOSEC) 40 MG capsule Take 1 capsule by mouth once daily (Patient taking differently: as needed. Takes every other day)       01/11/2022   10:11 AM 09/14/2021    2:07 PM 07/10/2021   10:32 AM 01/09/2021   10:14 AM  GAD 7 : Generalized Anxiety Score  Nervous, Anxious, on Edge 0 0 0 1  Control/stop worrying 0 0 0 0  Worry too much - different things 0 0 0 1  Trouble relaxing 0 0 0 1  Restless 0 0 0 0  Easily annoyed or irritable 1 0 0 0  Afraid - awful might happen 0 0 0 0  Total GAD 7 Score 1 0 0 3  Anxiety Difficulty Not difficult at all  Not difficult at all        01/11/2022   10:10 AM 09/14/2021    2:06 PM 07/10/2021   10:32 AM  Depression screen PHQ 2/9  Decreased Interest 1 0 1  Down, Depressed, Hopeless 0 0 1  PHQ - 2 Score 1 0  2  Altered sleeping 2 2 3   Tired, decreased energy 2 2 3   Change in appetite 0 0 0  Feeling bad or failure about yourself  0 0 0  Trouble concentrating 0 0 0  Moving slowly or fidgety/restless 0 0 0  Suicidal thoughts 0 0 0  PHQ-9 Score 5 4 8   Difficult doing work/chores Not difficult at all Not difficult at all Somewhat difficult    BP Readings from Last 3 Encounters:  01/11/22 128/76  12/29/21 (!) 136/92  09/14/21 132/84    Physical Exam Vitals and nursing note reviewed.  Constitutional:      General: She is not in acute distress.    Appearance: She is well-developed.  HENT:     Head: Normocephalic and atraumatic.     Right Ear: Tympanic membrane and ear canal normal.     Left Ear: Tympanic membrane and ear canal normal.     Nose:     Right Sinus: No maxillary sinus tenderness.     Left Sinus: No maxillary sinus tenderness.  Eyes:     General: No scleral icterus.       Right eye: No discharge.        Left eye: No discharge.     Conjunctiva/sclera: Conjunctivae normal.  Neck:     Thyroid: No thyromegaly.     Vascular: No carotid bruit.  Cardiovascular:     Rate and Rhythm: Normal rate and regular rhythm.     Pulses: Normal pulses.     Heart sounds: Normal heart sounds.  Pulmonary:     Effort: Pulmonary effort is normal. No respiratory distress.     Breath sounds: No wheezing.  Abdominal:     General: Bowel sounds are normal.     Palpations: Abdomen is soft.     Tenderness: There is no abdominal tenderness.  Musculoskeletal:     Cervical back: Normal range of motion. No erythema.     Right lower leg: No edema.     Left lower leg: No edema.  Lymphadenopathy:     Cervical: No cervical adenopathy.  Skin:    General: Skin is warm and dry.     Findings: No rash.  Neurological:     Mental Status: She is alert and oriented to person, place, and time.     Cranial Nerves: No cranial nerve deficit.     Sensory: No sensory deficit.     Deep Tendon Reflexes: Reflexes  are normal and symmetric.  Psychiatric:        Attention and Perception: Attention normal.        Mood and Affect: Mood normal.     Wt Readings from Last 3 Encounters:  01/11/22 157 lb (71.2 kg)  12/29/21 154 lb (69.9 kg)  09/14/21 156 lb (70.8 kg)    BP 128/76   Pulse 72   Ht 5' 5"  (1.651 m)   Wt 157 lb (71.2 kg)   SpO2 97%   BMI 26.13 kg/m   Assessment and Plan: 1. Annual physical exam Normal exam; continue healthy diet and exercise Return if dysuria recurs - CBC with Differential/Platelet - Comprehensive metabolic panel - Hemoglobin A1c - Lipid panel - TSH  2. Essential hypertension Clinically stable exam with well controlled BP. Tolerating medications without side effects at this time. Pt to continue current regimen and low sodium diet; benefits of regular exercise as able discussed. - CBC with Differential/Platelet - Comprehensive metabolic panel - TSH - lisinopril-hydrochlorothiazide (ZESTORETIC) 20-25 MG tablet; Take 1 tablet by mouth daily.  Dispense: 90 tablet; Refill: 3  3. Hyperlipidemia, mixed Check and advise. - Lipid panel  4. Prediabetes - Hemoglobin A1c  5. Osteoporosis without current pathological fracture, unspecified osteoporosis type DEXA scheduled. Continue calcium and vitamin D  6. Malignant neoplasm of lower-outer quadrant of right breast of female, estrogen receptor positive ([place]) Followed by Oncology On active treatment with AI  7. Gastroesophageal reflux disease, unspecified whether esophagitis present Symptoms well controlled PPI as needed. No red flag signs such as weight loss, n/v, melena Will continue prn omeprazole - CBC with Differential/Platelet  Partially dictated using Editor, commissioning. Any errors are unintentional.  Halina Maidens, MD [facility]  01/11/2022

## 2022-01-12 LAB — LIPID PANEL
Chol/HDL Ratio: 4.6 ratio — ABNORMAL HIGH (ref 0.0–4.4)
Cholesterol, Total: 233 mg/dL — ABNORMAL HIGH (ref 100–199)
HDL: 51 mg/dL (ref >39)
LDL Chol Calc (NIH): 153 mg/dL — ABNORMAL HIGH (ref 0–99)
Triglycerides: 160 mg/dL — ABNORMAL HIGH (ref 0–149)
VLDL Cholesterol Cal: 29 mg/dL (ref 5–40)

## 2022-01-12 LAB — COMPREHENSIVE METABOLIC PANEL
ALT: 15 IU/L (ref 0–32)
AST: 13 IU/L (ref 0–40)
Albumin/Globulin Ratio: 2 (ref 1.2–2.2)
Albumin: 4.7 g/dL (ref 3.8–4.9)
Alkaline Phosphatase: 93 IU/L (ref 44–121)
BUN/Creatinine Ratio: 11 (ref 9–23)
BUN: 8 mg/dL (ref 6–24)
Bilirubin Total: 0.3 mg/dL (ref 0.0–1.2)
CO2: 24 mmol/L (ref 20–29)
Calcium: 10 mg/dL (ref 8.7–10.2)
Chloride: 99 mmol/L (ref 96–106)
Creatinine, Ser: 0.72 mg/dL (ref 0.57–1.00)
Globulin, Total: 2.4 g/dL (ref 1.5–4.5)
Glucose: 126 mg/dL — ABNORMAL HIGH (ref 70–99)
Potassium: 4.4 mmol/L (ref 3.5–5.2)
Sodium: 140 mmol/L (ref 134–144)
Total Protein: 7.1 g/dL (ref 6.0–8.5)
eGFR: 97 mL/min/{1.73_m2} (ref >59)

## 2022-01-12 LAB — CBC WITH DIFFERENTIAL/PLATELET
Basophils Absolute: 0.1 10*3/uL (ref 0.0–0.2)
Basos: 1 %
EOS (ABSOLUTE): 0.3 10*3/uL (ref 0.0–0.4)
Eos: 3 %
Hematocrit: 40.9 % (ref 34.0–46.6)
Hemoglobin: 13.8 g/dL (ref 11.1–15.9)
Immature Grans (Abs): 0 10*3/uL (ref 0.0–0.1)
Immature Granulocytes: 0 %
Lymphocytes Absolute: 3.9 10*3/uL — ABNORMAL HIGH (ref 0.7–3.1)
Lymphs: 44 %
MCH: 30.8 pg (ref 26.6–33.0)
MCHC: 33.7 g/dL (ref 31.5–35.7)
MCV: 91 fL (ref 79–97)
Monocytes Absolute: 0.5 10*3/uL (ref 0.1–0.9)
Monocytes: 6 %
Neutrophils Absolute: 4.1 10*3/uL (ref 1.4–7.0)
Neutrophils: 46 %
Platelets: 330 10*3/uL (ref 150–450)
RBC: 4.48 x10E6/uL (ref 3.77–5.28)
RDW: 12.4 % (ref 11.7–15.4)
WBC: 8.9 10*3/uL (ref 3.4–10.8)

## 2022-01-12 LAB — HEMOGLOBIN A1C
Est. average glucose Bld gHb Est-mCnc: 140 mg/dL
Hgb A1c MFr Bld: 6.5 % — ABNORMAL HIGH (ref 4.8–5.6)

## 2022-01-12 LAB — TSH: TSH: 1.92 u[IU]/mL (ref 0.450–4.500)

## 2022-01-14 NOTE — Progress Notes (Signed)
Please schedule follow up appt.  KP

## 2022-01-17 ENCOUNTER — Encounter: Payer: Self-pay | Admitting: Oncology

## 2022-02-04 ENCOUNTER — Encounter (INDEPENDENT_AMBULATORY_CARE_PROVIDER_SITE_OTHER): Payer: Self-pay

## 2022-03-07 ENCOUNTER — Ambulatory Visit
Admission: RE | Admit: 2022-03-07 | Discharge: 2022-03-07 | Disposition: A | Discharge status: Home / Self Care | Payer: BC Managed Care – PPO | Source: Ambulatory Visit | Attending: Oncology | Admitting: Oncology

## 2022-03-07 DIAGNOSIS — F17200 Nicotine dependence, unspecified, uncomplicated: Secondary | ICD-10-CM | POA: Diagnosis not present

## 2022-03-07 DIAGNOSIS — M8589 Other specified disorders of bone density and structure, multiple sites: Secondary | ICD-10-CM | POA: Insufficient documentation

## 2022-03-07 DIAGNOSIS — Z780 Asymptomatic menopausal state: Secondary | ICD-10-CM | POA: Insufficient documentation

## 2022-03-07 DIAGNOSIS — Z800 Family history of malignant neoplasm of digestive organs: Secondary | ICD-10-CM | POA: Diagnosis present

## 2022-03-07 DIAGNOSIS — Z853 Personal history of malignant neoplasm of breast: Secondary | ICD-10-CM | POA: Insufficient documentation

## 2022-03-22 ENCOUNTER — Ambulatory Visit: Admission: RE | Admit: 2022-03-22 | Payer: BC Managed Care – PPO | Source: Ambulatory Visit

## 2022-03-22 ENCOUNTER — Other Ambulatory Visit: Payer: Self-pay

## 2022-03-22 ENCOUNTER — Other Ambulatory Visit: Payer: Self-pay | Admitting: Oncology

## 2022-03-22 DIAGNOSIS — C50511 Malignant neoplasm of lower-outer quadrant of right female breast: Secondary | ICD-10-CM

## 2022-03-25 ENCOUNTER — Inpatient Hospital Stay (HOSPITAL_BASED_OUTPATIENT_CLINIC_OR_DEPARTMENT_OTHER): Payer: BC Managed Care – PPO | Admitting: Oncology

## 2022-03-25 ENCOUNTER — Other Ambulatory Visit: Payer: Self-pay | Admitting: Oncology

## 2022-03-25 ENCOUNTER — Inpatient Hospital Stay: Payer: BC Managed Care – PPO | Attending: Oncology

## 2022-03-25 ENCOUNTER — Inpatient Hospital Stay: Payer: BC Managed Care – PPO

## 2022-03-25 ENCOUNTER — Encounter: Payer: Self-pay | Admitting: Oncology

## 2022-03-25 VITALS — BP 137/81 | HR 68 | Temp 97.9°F | Wt 156.0 lb

## 2022-03-25 DIAGNOSIS — Z170 Estrogen receptor positive status [ER+]: Secondary | ICD-10-CM

## 2022-03-25 DIAGNOSIS — Z90710 Acquired absence of both cervix and uterus: Secondary | ICD-10-CM | POA: Insufficient documentation

## 2022-03-25 DIAGNOSIS — C50511 Malignant neoplasm of lower-outer quadrant of right female breast: Secondary | ICD-10-CM

## 2022-03-25 DIAGNOSIS — M810 Age-related osteoporosis without current pathological fracture: Secondary | ICD-10-CM

## 2022-03-25 DIAGNOSIS — R1110 Vomiting, unspecified: Secondary | ICD-10-CM | POA: Diagnosis not present

## 2022-03-25 DIAGNOSIS — Z1509 Genetic susceptibility to other malignant neoplasm: Secondary | ICD-10-CM | POA: Insufficient documentation

## 2022-03-25 DIAGNOSIS — Z803 Family history of malignant neoplasm of breast: Secondary | ICD-10-CM | POA: Diagnosis not present

## 2022-03-25 DIAGNOSIS — Z1502 Genetic susceptibility to malignant neoplasm of ovary: Secondary | ICD-10-CM

## 2022-03-25 DIAGNOSIS — Z79811 Long term (current) use of aromatase inhibitors: Secondary | ICD-10-CM | POA: Insufficient documentation

## 2022-03-25 DIAGNOSIS — Z9079 Acquired absence of other genital organ(s): Secondary | ICD-10-CM | POA: Insufficient documentation

## 2022-03-25 DIAGNOSIS — Z1501 Genetic susceptibility to malignant neoplasm of breast: Secondary | ICD-10-CM

## 2022-03-25 DIAGNOSIS — Z800 Family history of malignant neoplasm of digestive organs: Secondary | ICD-10-CM | POA: Diagnosis not present

## 2022-03-25 DIAGNOSIS — M818 Other osteoporosis without current pathological fracture: Secondary | ICD-10-CM

## 2022-03-25 DIAGNOSIS — Z87891 Personal history of nicotine dependence: Secondary | ICD-10-CM | POA: Diagnosis not present

## 2022-03-25 DIAGNOSIS — Z9013 Acquired absence of bilateral breasts and nipples: Secondary | ICD-10-CM | POA: Diagnosis not present

## 2022-03-25 DIAGNOSIS — Z90722 Acquired absence of ovaries, bilateral: Secondary | ICD-10-CM | POA: Insufficient documentation

## 2022-03-25 LAB — CBC WITH DIFFERENTIAL/PLATELET
Abs Immature Granulocytes: 0.02 10*3/uL (ref 0.00–0.07)
Basophils Absolute: 0.1 10*3/uL (ref 0.0–0.1)
Basophils Relative: 1 %
Eosinophils Absolute: 0.3 10*3/uL (ref 0.0–0.5)
Eosinophils Relative: 3 %
HCT: 41.8 % (ref 36.0–46.0)
Hemoglobin: 14.4 g/dL (ref 12.0–15.0)
Immature Granulocytes: 0 %
Lymphocytes Relative: 42 %
Lymphs Abs: 3.8 10*3/uL (ref 0.7–4.0)
MCH: 31.9 pg (ref 26.0–34.0)
MCHC: 34.4 g/dL (ref 30.0–36.0)
MCV: 92.7 fL (ref 80.0–100.0)
Monocytes Absolute: 0.6 10*3/uL (ref 0.1–1.0)
Monocytes Relative: 7 %
Neutro Abs: 4.2 10*3/uL (ref 1.7–7.7)
Neutrophils Relative %: 47 %
Platelets: 334 10*3/uL (ref 150–400)
RBC: 4.51 MIL/uL (ref 3.87–5.11)
RDW: 13 % (ref 11.5–15.5)
WBC: 8.9 10*3/uL (ref 4.0–10.5)
nRBC: 0 % (ref 0.0–0.2)

## 2022-03-25 LAB — COMPREHENSIVE METABOLIC PANEL
ALT: 18 U/L (ref 0–44)
AST: 25 U/L (ref 15–41)
Albumin: 4.4 g/dL (ref 3.5–5.0)
Alkaline Phosphatase: 83 U/L (ref 38–126)
Anion gap: 11 (ref 5–15)
BUN: 9 mg/dL (ref 6–20)
CO2: 24 mmol/L (ref 22–32)
Calcium: 9.3 mg/dL (ref 8.9–10.3)
Chloride: 101 mmol/L (ref 98–111)
Creatinine, Ser: 0.6 mg/dL (ref 0.44–1.00)
GFR, Estimated: >60 mL/min (ref >60)
Glucose, Bld: 146 mg/dL — ABNORMAL HIGH (ref 70–99)
Potassium: 3.9 mmol/L (ref 3.5–5.1)
Sodium: 136 mmol/L (ref 135–145)
Total Bilirubin: 0.5 mg/dL (ref 0.3–1.2)
Total Protein: 7.7 g/dL (ref 6.5–8.1)

## 2022-03-25 MED ORDER — ZOLEDRONIC ACID 4 MG/100ML IV SOLN
4.0000 mg | Freq: Once | INTRAVENOUS | Status: AC
  Administered 2022-03-25: 4 mg via INTRAVENOUS
  Filled 2022-03-25: qty 100

## 2022-03-25 MED ORDER — EXEMESTANE 25 MG PO TABS: 25.0000 mg | ORAL_TABLET | Freq: Every day | ORAL | 5 refills | Status: DC

## 2022-03-25 MED ORDER — SODIUM CHLORIDE 0.9 % IV SOLN
Freq: Once | INTRAVENOUS | Status: AC
  Filled 2022-03-25: qty 250

## 2022-03-26 ENCOUNTER — Telehealth: Payer: Self-pay

## 2022-03-26 ENCOUNTER — Encounter: Payer: Self-pay | Admitting: Oncology

## 2022-03-26 NOTE — Assessment & Plan Note (Signed)
status post hysterectomy with bilateral salpingo-oophorectomy.  Annual dermatology evaluation. She has a family history of pancreatic cancer.  obtain annual MRI abdomen with and without contrast in December 2023.

## 2022-03-26 NOTE — Telephone Encounter (Signed)
Please hold off rescheduling MRI, pt decided not to proceed due to cost.

## 2022-03-26 NOTE — Telephone Encounter (Signed)
-----  Message from Earlie Server, MD sent at 03/26/2022  8:44 AM EST ----- I forget to  ask why MRI abdomen w wo contrast was not done.  She is due for that. If she agrees, please reschedule. Reason is BRCA2+ and family history of pancreatic cancer.

## 2022-03-26 NOTE — Assessment & Plan Note (Signed)
Stage IB pT2 pN0 M0 ER + PR+ HER2 negative right breast cancer, grade 3, +LVI,  Bilateral mastectomy and reconstruction [01/14/2019]  -BRCA2 positive Labs reviewed and discussed with patient. Arimidex --> letrozole Still feels very fatigued. Interested in trying other AI.  Recommend stop letrozole, switch to Aromasin. New Rx sent to pharmacy.

## 2022-03-26 NOTE — Assessment & Plan Note (Signed)
Zometa every 6 months-proceed with Zometa today..  Patient prefers to stay on aromatase inhibitor rather than switch to tamoxifen.Recommend exercise as tolerated. Repeat DEXA showed improved, currently osteopenic T score -2.3

## 2022-03-26 NOTE — Progress Notes (Signed)
Hematology/Oncology Progress note [phone] [phone]      Patient Care Team: Glean Hess, MD as PCP - General (Internal Medicine) Ree Edman, MD (Dermatology) Rico Junker, RN as Registered Nurse Herbert Pun, MD as Consulting Physician (General Surgery) Earlie Server, MD as Consulting Physician (Oncology) Culloden, Loel Lofty, DO as Attending Physician (Plastic Surgery)    ASSESSMENT & PLAN:   Cancer Staging  Malignant neoplasm of lower-outer quadrant of right breast of female, estrogen receptor positive ([place]) Staging form: Breast, AJCC 8th Edition - Clinical: Stage IIA (cT2, cN0, cM0, G3, ER+, PR+, HER2: Equivocal) - Signed by Earlie Server, MD on 03/26/2022 - Pathologic stage from 01/21/2019: Stage IB (pT2, pN0, cM0, G3, ER+, PR+, HER2-) - Signed by Earlie Server, MD on 01/22/2019   Malignant neoplasm of lower-outer quadrant of right breast of female, estrogen receptor positive ([place]) Stage IB pT2 pN0 M0 ER + PR+ HER2 negative right breast cancer, grade 3, +LVI,  Bilateral mastectomy and reconstruction [01/14/2019]  -BRCA2 positive Labs reviewed and discussed with patient. Arimidex --> letrozole Still feels very fatigued. Interested in trying other AI.  Recommend stop letrozole, switch to Aromasin. New Rx sent to pharmacy.    Osteoporosis Zometa every 6 months-proceed with Zometa today..  Patient prefers to stay on aromatase inhibitor rather than switch to tamoxifen.Recommend exercise as tolerated. Repeat DEXA showed improved, currently osteopenic T score -2.3   BRCA2 gene mutation positive in female status post hysterectomy with bilateral salpingo-oophorectomy.  Annual dermatology evaluation. She has a family history of pancreatic cancer.  obtain annual MRI abdomen with and without contrast in December 2023.   Orders Placed This Encounter  Procedures   CBC with Differential/Platelet    Standing Status:   Future    Standing  Expiration Date:   03/26/2023   Comprehensive metabolic panel    Standing Status:   Future    Standing Expiration Date:   03/25/2023   Ambulatory referral to Gastroenterology    Referral Priority:   Routine    Referral Type:   Consultation    Referral Reason:   Specialty Services Required    Referred to Provider:   Lucilla Lame, MD    Number of Visits Requested:   1   Follow up in 6 months.  All questions were answered. The patient knows to call the clinic with any problems, questions or concerns.  Earlie Server, MD, PhD [facility] Hematology Oncology 03/25/2022     CHIEF COMPLAINTS/REASON FOR VISIT:  Follow-up for breast cancer.  HISTORY OF PRESENTING ILLNESS:  Cheryl Mooney is a  56 y.o.  female with PMH listed below who was referred to me for evaluation of breast cancer Patient reports feeling mass of her right breast week before her annual mammogram. 12/03/2018 patient had bilateral diagnostic mammogram and ultrasound  which showed suspicious 2.1 x 1.3 x 1.6 irregular mass at 7:00 in the right breast, 2 cm from the nipple. 2 borderline lymph nodes are seen in the right axilla.  1 of the nodes demonstrate a cortex of 4.4 mm.  Both lymph nodes demonstrated retention of fatty hila. Patient underwent ultrasound-guided biopsy of right breast mass as well as ultrasound-guided biopsy of 1 of the 2 mildly abnormal right axillary lymph nodes. 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 Right axilla lymph node negative for malignancy. ER> 90% positive, PR11-50% positive, HER-2 equivocal, 2+ by IHC.  FISH negative.   Nipple discharge: Denies Family  history: Mother diagnosed with breast cancer at age of 31, and colon cancer at age of 8.  Mother has BRCA2 mutation.   Maternal grandmother breast cancer, maternal great aunt breast cancer.  Patient recalls that she was tested long time ago and was not aware of any  results.  OCP use: In her 20s-30s Estrogen and progesterone therapy: denies History of radiation to chest: denies.  Previous breast surgery: Denies  BRCA 2 positive.  # 01/14/2019 patient underwent bilateral mastectomy with sentinel lymph node biopsy of left and the right axillary. Right breast showed invasive mammary carcinoma, DCIS positive, apocrine metaplastic, stromal fibrosis, duct ectasia, simple cyst formation, usual epithelial hyperplasia.  Sentinel lymph node on the right side was negative. pT2 pN0 Left prophylactic mastectomy and a sentinel lymph node negative for malignancy.  #OncotypeDX recurrence score 31, adjuvant chemotherapy benefit more than 15%. # She follows up with plastic surgeon Dr. Marla Roe, she has expander, expanding process is being during chemotherapy. Mediport was placed by Dr. Peyton Najjar on 02/18/2019.  Patient had 1 cycle of AC, chemotherapy plan was switched to docetaxel and carboplatin after consulting expert opinion. She has finished 4 cycle of docetaxel and carboplatin. March 2021, started on Arimidex. May 2021, patient underwent laparoscopic-assisted vaginal hysterectomy with bilateralsalpingectomy and oophorectomy 10/13/19 bilateral breast reconstruction  Porta cath removal.  03/16/20 treatment for bilateral breast asymmetry treatment.  May 2023, stopped Arimidex and switched to letrozole due to fatigue.   INTERVAL HISTORY Cheryl Mooney is a 56 y.o. female who has above history reviewed by me today presents for follow up visit for management of stage I breast cancer, ER PR positive, HER-2 negative, hereditary BRCA 2 mutation.  Patient takes letrozole 2.21m daily. Still feel very fatigued.  No other new concerns  Review of Systems  Constitutional:  Negative for appetite change, chills and fever.  HENT:   Negative for hearing loss and voice change.   Eyes:  Negative for eye problems.  Respiratory:  Negative for chest tightness and cough.    Cardiovascular:  Negative for chest pain.  Gastrointestinal:  Negative for abdominal distention, abdominal pain, blood in stool and diarrhea.  Endocrine: Positive for hot flashes.  Genitourinary:  Negative for difficulty urinating and frequency.   Musculoskeletal:  Positive for arthralgias.  Skin:  Negative for itching and rash.  Neurological:  Negative for extremity weakness.  Hematological:  Negative for adenopathy.  Psychiatric/Behavioral:  Negative for confusion. The patient is not nervous/anxious.     MEDICAL HISTORY:  Past Medical History:  Diagnosis Date   Anemia    things seem back to normal   Benign neoplasm of sigmoid colon    Benign neoplasm of transverse colon    Breast cancer ([place]    Depression    Encounter for antineoplastic chemotherapy 12/30/2018   Pathogenic variant in BRCA2 called [id number] on the [facility] Breast Cancer STAT Panel. The STAT Breast cancer panel offered by [facility] includes sequencing and rearrangement analysis for the following 9 genes:  ATM, BRCA1, BRCA2, CDH1, CHEK2, PALB2, PTEN, STK11 and TP53.  The report date is 12/30/2018. Common Hereditary Cancer Panel results pending.    Family history of BRCA2 gene positive    Family history of breast cancer    Family history of colon cancer    Family history of lung cancer    Family history of pancreatic cancer    GERD (gastroesophageal reflux disease)    History of kidney stones    Hypertension    Malignant neoplasm  of lower-outer quadrant of right breast of female, estrogen receptor positive ([place]) 12/17/2018   Smoker     SURGICAL HISTORY: Past Surgical History:  Procedure Laterality Date   AXILLARY SENTINEL NODE BIOPSY Bilateral 01/14/2019   Procedure: AXILLARY SENTINEL NODE BIOPSY;  Surgeon: Herbert Pun, MD;  Location: [facility];  Service: General;  Laterality: Bilateral;   BREAST BIOPSY Right 12/08/2018   [place] bx of mass with calcs path pending   BREAST BIOPSY Right  12/08/2018   [place] bx of LN, path pending   BREAST RECONSTRUCTION WITH PLACEMENT OF TISSUE EXPANDER AND ALLODERM Bilateral 01/14/2019   Procedure: BILATERAL BREAST RECONSTRUCTION WITH PLACEMENT OF TISSUE EXPANDER AND FLEX HD;  Surgeon: Wallace Going, DO;  Location: [facility];  Service: Plastics;  Laterality: Bilateral;   COLONOSCOPY WITH PROPOFOL N/A 02/17/2015   Procedure: COLONOSCOPY WITH PROPOFOL;  Surgeon: Lucilla Lame, MD;  Location: [place];  Service: Endoscopy;  Laterality: N/A;   COLONOSCOPY WITH PROPOFOL N/A 07/12/2019   Procedure: COLONOSCOPY WITH BIOPSY;  Surgeon: Lucilla Lame, MD;  Location: [place];  Service: Endoscopy;  Laterality: N/A;  priority 3 PORT A CATH NEEDS ACCESSED   CYSTOSCOPY N/A 08/05/2019   Procedure: CYSTOSCOPY;  Surgeon: Homero Fellers, MD;  Location: [facility];  Service: Gynecology;  Laterality: N/A;   LIPOSUCTION WITH LIPOFILLING Bilateral 03/16/2020   Procedure: Bilateral Lipofilling of breasts for symmetry;  Surgeon: Wallace Going, DO;  Location: [place];  Service: Plastics;  Laterality: Bilateral;  1.5 hours   POLYPECTOMY  02/17/2015   Procedure: POLYPECTOMY;  Surgeon: Lucilla Lame, MD;  Location: [place];  Service: Endoscopy;;   POLYPECTOMY N/A 07/12/2019   Procedure: POLYPECTOMY;  Surgeon: Lucilla Lame, MD;  Location: [place];  Service: Endoscopy;  Laterality: N/A;   PORTA CATH REMOVAL Left 10/13/2019   Procedure: PORTA CATH REMOVAL;  Surgeon: Wallace Going, DO;  Location: [place];  Service: Plastics;  Laterality: Left;   PORTACATH PLACEMENT Left 02/18/2019   Procedure: INSERTION PORT-A-CATH;  Surgeon: Herbert Pun, MD;  Location: [facility];  Service: General;  Laterality: Left;   REMOVAL OF BILATERAL TISSUE EXPANDERS WITH PLACEMENT OF BILATERAL BREAST IMPLANTS Bilateral 10/13/2019   Procedure: REMOVAL OF BILATERAL TISSUE EXPANDERS WITH PLACEMENT  OF BILATERAL BREAST IMPLANTS;  Surgeon: Wallace Going, DO;  Location: [place];  Service: Plastics;  Laterality: Bilateral;  2 hours please   SIMPLE MASTECTOMY WITH AXILLARY SENTINEL NODE BIOPSY Left 01/14/2019   Procedure: LEFT SIMPLE MASTECTOMY;  Surgeon: Herbert Pun, MD;  Location: [facility];  Service: General;  Laterality: Left;   TOTAL LAPAROSCOPIC HYSTERECTOMY WITH BILATERAL SALPINGO OOPHORECTOMY Bilateral 08/05/2019   Procedure: TOTAL LAPAROSCOPIC HYSTERECTOMY WITH BILATERAL SALPINGO OOPHORECTOMY;  Surgeon: Homero Fellers, MD;  Location: [facility];  Service: Gynecology;  Laterality: Bilateral;   TOTAL MASTECTOMY Right 01/14/2019   Procedure: RIGHT TOTAL MASTECTOMY;  Surgeon: Herbert Pun, MD;  Location: [facility];  Service: General;  Laterality: Right;   WISDOM TOOTH EXTRACTION      SOCIAL HISTORY: Social History   Socioeconomic History   Marital status: Married    Spouse name: john   Number of children: Not on file   Years of education: Not on file   Highest education level: Not on file  Occupational History   Occupation: sub teacher    Comment: retired  Tobacco Use   Smoking status: Former    Packs/day: 0.05    Years: 20.00  Total pack years: 1.00    Types: Cigarettes   Smokeless tobacco: Never  Vaping Use   Vaping Use: Never used  Substance and Sexual Activity   Alcohol use: Yes    Alcohol/week: 2.0 standard drinks of alcohol    Types: 2 Standard drinks or equivalent per week    Comment: occasional   Drug use: No   Sexual activity: Yes    Birth control/protection: Surgical  Other Topics Concern   Not on file  Social History Narrative   Patient lives with husband.   Social Determinants of Health   Financial Resource Strain: Low Risk  (09/14/2021)   Overall Financial Resource Strain (CARDIA)    Difficulty of Paying Living Expenses: Not hard at all  Food Insecurity: No Food Insecurity (09/14/2021)   Hunger Vital  Sign    Worried About Running Out of Food in the Last Year: Never true    Ran Out of Food in the Last Year: Never true  Transportation Needs: No Transportation Needs (09/14/2021)   PRAPARE - Hydrologist (Medical): No    Lack of Transportation (Non-Medical): No  Physical Activity: Not on file  Stress: Not on file  Social Connections: Not on file  Intimate Partner Violence: Not At Risk (09/14/2021)   Humiliation, Afraid, Rape, and Kick questionnaire    Fear of Current or Ex-Partner: No    Emotionally Abused: No    Physically Abused: No    Sexually Abused: No  She is a retired Pharmacist, hospital  FAMILY HISTORY: Family History  Problem Relation Age of Onset   Colon cancer Mother 32   Breast cancer Mother 63       "BRCA2+"   Hypertension Father    Stroke Father    Alzheimer's disease Father    Prostate cancer Father    Breast cancer Maternal Aunt        mat great aunt   Breast cancer Maternal Grandmother 58   Pancreatic cancer Maternal Grandfather 41    ALLERGIES:  has No Known Allergies.  MEDICATIONS:  Current Outpatient Medications  Medication Sig Dispense Refill   acetaminophen (TYLENOL) 500 MG tablet Take 2 tablets (1,000 mg total) by mouth every 6 (six) hours as needed for mild pain. 30 tablet 0   CALCIUM PO Take by mouth daily.     Calcium Polycarbophil 625 MG CHEW Chew by mouth.     cetirizine (ZYRTEC) 10 MG tablet Take 10 mg by mouth daily.     Cholecalciferol (VITAMIN D3 PO) Take by mouth daily.     DULoxetine (CYMBALTA) 60 MG capsule Take 1 capsule by mouth once daily 90 capsule 0   exemestane (AROMASIN) 25 MG tablet Take 1 tablet (25 mg total) by mouth daily after breakfast. 30 tablet 5   lisinopril-hydrochlorothiazide (ZESTORETIC) 20-25 MG tablet Take 1 tablet by mouth daily. 90 tablet 3   multivitamin-iron-minerals-folic acid (CENTRUM) chewable tablet Chew 1 tablet by mouth daily.     omeprazole (PRILOSEC) 40 MG capsule Take 1 capsule by mouth  once daily (Patient not taking: Reported on 03/25/2022) 90 capsule 0   No current facility-administered medications for this visit.     PHYSICAL EXAMINATION: ECOG PERFORMANCE STATUS: 0 - Asymptomatic Vitals:   03/25/22 1315  BP: 137/81  Pulse: 68  Temp: 97.9 F (36.6 C)  SpO2: 99%   Filed Weights   03/25/22 1315  Weight: 156 lb (70.8 kg)    Physical Exam Constitutional:      General:  She is not in acute distress. HENT:     Head: Normocephalic and atraumatic.  Eyes:     General: No scleral icterus.    Pupils: Pupils are equal, round, and reactive to light.  Cardiovascular:     Rate and Rhythm: Normal rate and regular rhythm.     Heart sounds: Normal heart sounds.  Pulmonary:     Effort: Pulmonary effort is normal. No respiratory distress.     Breath sounds: No wheezing.  Abdominal:     General: Bowel sounds are normal. There is no distension.     Palpations: Abdomen is soft. There is no mass.     Tenderness: There is no abdominal tenderness.  Musculoskeletal:        General: No deformity. Normal range of motion.     Cervical back: Normal range of motion and neck supple.  Skin:    General: Skin is warm and dry.     Findings: No erythema or rash.  Neurological:     Mental Status: She is alert and oriented to person, place, and time. Mental status is at baseline.     Cranial Nerves: No cranial nerve deficit.     Coordination: Coordination normal.  Psychiatric:        Mood and Affect: Mood normal.   Bilateral mastectomy with implant Breast exam is performed in seated and lying down position. Patient is status post bilateral mastectomy with implants.   The edges are intact and there is no evidence of any chest wall recurrence.  No palpable bilateral axillary adenopathy  No focal bruising of right breast or right flank area.  LABORATORY DATA:  I have reviewed the data as listed Lab Results  Component Value Date   WBC 8.9 03/25/2022   HGB 14.4 03/25/2022   HCT  41.8 03/25/2022   MCV 92.7 03/25/2022   PLT 334 03/25/2022   Recent Labs    09/04/21 1257 01/11/22 1056 03/25/22 1249  NA 135 140 136  K 4.3 4.4 3.9  CL 100 99 101  CO2 _0 GLUCOSE 123* 126* 146*  BUN _1 CREATININE 0.71 0.72 0.60  CALCIUM 9.1 10.0 9.3  GFRNONAA >60  --  >60  PROT 7.8 7.1 7.7  ALBUMIN 4.4 4.7 4.4  AST _2 ALT _3 ALKPHOS 76 93 83  BILITOT 0.3 0.3 0.5    Iron/TIBC/Ferritin/ %Sat No results found for: "IRON", "TIBC", "FERRITIN", "IRONPCTSAT"   RADIOGRAPHIC STUDIES: I have personally reviewed the radiological images as listed and agreed with the findings in the report. DG Bone Density  Result Date: 03/08/2022 EXAM: DUAL X-RAY ABSORPTIOMETRY (DXA) FOR BONE MINERAL DENSITY IMPRESSION: Your patient Cheryl Mooney completed a BMD test on 03/07/2022 using the [place] (software version: 14.10) manufactured by [employer]. The following summarizes the results of our evaluation. Technologist::TNB PATIENT BIOGRAPHICAL: Name: Cheryl Mooney, Cheryl Mooney Patient ID: [id number] Birth Date: 01-01-1966 Height: 65.0 in. Gender: Female Exam Date: 03/07/2022 Weight: 157.0 lbs. Indications: Caucasian, History of Breast Cancer, History of Chemo, Hysterectomy, Osteopenia, Postmenopausal, Tobacco User Fractures: Wrist Right Treatments: calcium w/ vit D, Letrozole DENSITOMETRY RESULTS: Site      Region    Measured Date Measured Age WHO Classification Young Adult T-score BMD         %Change vs. Previous Significant Change (*) AP Spine L1-L4 1/07/2020 58.6 Osteopenia -2.3 0.904 g/cm2 - - DualFemur Neck Left 1/07/2020 58.6 Osteopenia -2.4 0.704 g/cm2 - -  ASSESSMENT: The BMD measured at Femur Neck Left is 0.704 g/cm2 with a T-score of -2.4. This patient is considered osteopenic according to [facility]) criteria. The scan quality is good. World Pharmacologist (WHO) criteria for post-menopausal, Caucasian Women: Normal:                    T-score at or above -1 SD Osteopenia/low bone mass: T-score between -1 and -2.5 SD Osteoporosis:             T-score at or below -2.5 SD Based on the [place], the 10 year probability of a major osteoporotic fracture is 10.7%. The 10 year probability of a hip fracture is 1.9%. RECOMMENDATIONS: 1. All patients should optimize calcium and vitamin D intake. 2. Consider FDA-approved medical therapies in postmenopausal women and men aged 70 years and older, based on the following: a. A hip or vertebral(clinical or morphometric) fracture b. T-score < -2.5 at the femoral neck or spine after appropriate evaluation to exclude secondary causes c. Low bone mass (T-score between -1.0 and -2.5 at the femoral neck or spine) and a 10-year probability of a hip fracture > 3% or a 10-year probability of a major osteoporosis-related fracture > 20% based on the [place]-adapted WHO algorithm 3. Clinician judgment and/or patient preferences may indicate treatment for people with 10-year fracture probabilities above or below these levels FOLLOW-UP: People with diagnosed cases of osteoporosis or at high risk for fracture should have regular bone mineral density tests. For patients eligible for Medicare, routine testing is allowed once every 2 years. The testing frequency can be increased to one year for patients who have rapidly progressing disease, those who are receiving or discontinuing medical therapy to restore bone mass, or have additional risk factors. I have reviewed this report, and agree with the above findings. [facility] Electronically Signed   By: Ammie Ferrier M.D.   On: 03/08/2022 10:13

## 2022-03-26 NOTE — Telephone Encounter (Signed)
VM left on cell phone and Mychart mssg sent to pt informing her that Mri will be r/s.   Please r/s MRI abdomen and inform pt of appt.

## 2022-05-17 ENCOUNTER — Ambulatory Visit: Payer: BC Managed Care – PPO | Admitting: Internal Medicine

## 2022-05-17 ENCOUNTER — Telehealth: Payer: Self-pay | Admitting: Internal Medicine

## 2022-05-17 NOTE — Assessment & Plan Note (Deleted)
Last A1C 65. Recommend low carb diet and weight loss

## 2022-05-17 NOTE — Progress Notes (Deleted)
Date:  05/17/2022   Name:  Cheryl Mooney   DOB:  17-Jul-1965   MRN:  [id number]   Chief Complaint: No chief complaint on file.  Diabetes She presents for her follow-up diabetic visit. Diabetes type: prediabetes. Her disease course has been improving. Pertinent negatives for hypoglycemia include no dizziness or headaches. Pertinent negatives for diabetes include no chest pain, no fatigue and no weakness. There are no hypoglycemic complications. Pertinent negatives for diabetic complications include no CVA. Current diabetic treatment includes diet. She is compliant with treatment most of the time.  Hypertension This is a chronic problem. The problem is controlled. Pertinent negatives include no chest pain, headaches, palpitations or shortness of breath. Past treatments include ACE inhibitors and diuretics. There is no history of kidney disease, CAD/MI or CVA.    Lab Results  Component Value Date   NA 136 03/25/2022   K 3.9 03/25/2022   CO2 24 03/25/2022   GLUCOSE 146 (H) 03/25/2022   BUN 9 03/25/2022   CREATININE 0.60 03/25/2022   CALCIUM 9.3 03/25/2022   EGFR 97 01/11/2022   GFRNONAA >60 03/25/2022   Lab Results  Component Value Date   CHOL 233 (H) 01/11/2022   HDL 51 01/11/2022   LDLCALC 153 (H) 01/11/2022   TRIG 160 (H) 01/11/2022   CHOLHDL 4.6 (H) 01/11/2022   Lab Results  Component Value Date   TSH 1.920 01/11/2022   Lab Results  Component Value Date   HGBA1C 6.5 (H) 01/11/2022   Lab Results  Component Value Date   WBC 8.9 03/25/2022   HGB 14.4 03/25/2022   HCT 41.8 03/25/2022   MCV 92.7 03/25/2022   PLT 334 03/25/2022   Lab Results  Component Value Date   ALT 18 03/25/2022   AST 25 03/25/2022   ALKPHOS 83 03/25/2022   BILITOT 0.5 03/25/2022   Lab Results  Component Value Date   VD25OH 35.4 07/10/2021     Review of Systems  Constitutional:  Negative for fatigue and unexpected weight change.  HENT:  Negative for nosebleeds.   Eyes:  Negative for  visual disturbance.  Respiratory:  Negative for cough, chest tightness, shortness of breath and wheezing.   Cardiovascular:  Negative for chest pain, palpitations and leg swelling.  Gastrointestinal:  Negative for abdominal pain, constipation and diarrhea.  Neurological:  Negative for dizziness, weakness, light-headedness and headaches.    Patient Active Problem List   Diagnosis Date Noted   Gastroesophageal reflux disease 01/11/2022   S/P total hysterectomy and bilateral salpingo-oophorectomy 01/09/2021   Osteoporosis 01/09/2021   Breast asymmetry following reconstructive surgery 02/22/2020   Vitamin D deficiency 11/26/2019   Prediabetes 11/26/2019   S/P breast reconstruction, bilateral 11/07/2019   Carrier of high risk cancer gene mutation    Personal history of colonic polyps    Polyp of sigmoid colon    Acquired absence of bilateral breasts and nipples 07/06/2019   Goals of care, counseling/discussion 03/26/2019   Hyponatremia 03/08/2019   Constipation 03/08/2019   Chest wall pain following surgery 03/08/2019   BRCA2 gene mutation positive in female 12/30/2018   Family history of pancreatic cancer    Family history of lung cancer    Family history of BRCA2 gene positive    Family history of colon cancer    Malignant neoplasm of lower-outer quadrant of right breast of female, estrogen receptor positive ([place]) 12/17/2018   Hyperlipidemia, mixed 11/19/2016   Tubular adenoma of colon 02/21/2015   Tobacco use disorder 11/27/2014  Essential hypertension 11/27/2014   Hx of abnormal cervical Pap smear 09/07/2011    No Known Allergies  Past Surgical History:  Procedure Laterality Date   AXILLARY SENTINEL NODE BIOPSY Bilateral 01/14/2019   Procedure: AXILLARY SENTINEL NODE BIOPSY;  Surgeon: Herbert Pun, MD;  Location: [facility];  Service: General;  Laterality: Bilateral;   BREAST BIOPSY Right 12/08/2018   [place] bx of mass with calcs path pending   BREAST BIOPSY Right  12/08/2018   [place] bx of LN, path pending   BREAST RECONSTRUCTION WITH PLACEMENT OF TISSUE EXPANDER AND ALLODERM Bilateral 01/14/2019   Procedure: BILATERAL BREAST RECONSTRUCTION WITH PLACEMENT OF TISSUE EXPANDER AND FLEX HD;  Surgeon: Wallace Going, DO;  Location: [facility];  Service: Plastics;  Laterality: Bilateral;   COLONOSCOPY WITH PROPOFOL N/A 02/17/2015   Procedure: COLONOSCOPY WITH PROPOFOL;  Surgeon: Lucilla Lame, MD;  Location: [place];  Service: Endoscopy;  Laterality: N/A;   COLONOSCOPY WITH PROPOFOL N/A 07/12/2019   Procedure: COLONOSCOPY WITH BIOPSY;  Surgeon: Lucilla Lame, MD;  Location: [place];  Service: Endoscopy;  Laterality: N/A;  priority 3 PORT A CATH NEEDS ACCESSED   CYSTOSCOPY N/A 08/05/2019   Procedure: CYSTOSCOPY;  Surgeon: Homero Fellers, MD;  Location: [facility];  Service: Gynecology;  Laterality: N/A;   LIPOSUCTION WITH LIPOFILLING Bilateral 03/16/2020   Procedure: Bilateral Lipofilling of breasts for symmetry;  Surgeon: Wallace Going, DO;  Location: [place];  Service: Plastics;  Laterality: Bilateral;  1.5 hours   POLYPECTOMY  02/17/2015   Procedure: POLYPECTOMY;  Surgeon: Lucilla Lame, MD;  Location: [place];  Service: Endoscopy;;   POLYPECTOMY N/A 07/12/2019   Procedure: POLYPECTOMY;  Surgeon: Lucilla Lame, MD;  Location: [place];  Service: Endoscopy;  Laterality: N/A;   PORTA CATH REMOVAL Left 10/13/2019   Procedure: PORTA CATH REMOVAL;  Surgeon: Wallace Going, DO;  Location: [place];  Service: Plastics;  Laterality: Left;   PORTACATH PLACEMENT Left 02/18/2019   Procedure: INSERTION PORT-A-CATH;  Surgeon: Herbert Pun, MD;  Location: [facility];  Service: General;  Laterality: Left;   REMOVAL OF BILATERAL TISSUE EXPANDERS WITH PLACEMENT OF BILATERAL BREAST IMPLANTS Bilateral 10/13/2019   Procedure: REMOVAL OF BILATERAL TISSUE EXPANDERS WITH PLACEMENT  OF BILATERAL BREAST IMPLANTS;  Surgeon: Wallace Going, DO;  Location: [place];  Service: Plastics;  Laterality: Bilateral;  2 hours please   SIMPLE MASTECTOMY WITH AXILLARY SENTINEL NODE BIOPSY Left 01/14/2019   Procedure: LEFT SIMPLE MASTECTOMY;  Surgeon: Herbert Pun, MD;  Location: [facility];  Service: General;  Laterality: Left;   TOTAL LAPAROSCOPIC HYSTERECTOMY WITH BILATERAL SALPINGO OOPHORECTOMY Bilateral 08/05/2019   Procedure: TOTAL LAPAROSCOPIC HYSTERECTOMY WITH BILATERAL SALPINGO OOPHORECTOMY;  Surgeon: Homero Fellers, MD;  Location: [facility];  Service: Gynecology;  Laterality: Bilateral;   TOTAL MASTECTOMY Right 01/14/2019   Procedure: RIGHT TOTAL MASTECTOMY;  Surgeon: Herbert Pun, MD;  Location: [facility];  Service: General;  Laterality: Right;   WISDOM TOOTH EXTRACTION      Social History   Tobacco Use   Smoking status: Former    Packs/day: 0.05    Years: 20.00    Total pack years: 1.00    Types: Cigarettes   Smokeless tobacco: Never  Vaping Use   Vaping Use: Never used  Substance Use Topics   Alcohol use: Yes    Alcohol/week: 2.0 standard drinks of alcohol    Types: 2 Standard drinks or equivalent per week    Comment:  occasional   Drug use: No     Medication list has been reviewed and updated.  No outpatient medications have been marked as taking for the 05/17/22 encounter (Appointment) with Glean Hess, MD.       01/11/2022   10:11 AM 09/14/2021    2:07 PM 07/10/2021   10:32 AM 01/09/2021   10:14 AM  GAD 7 : Generalized Anxiety Score  Nervous, Anxious, on Edge 0 0 0 1  Control/stop worrying 0 0 0 0  Worry too much - different things 0 0 0 1  Trouble relaxing 0 0 0 1  Restless 0 0 0 0  Easily annoyed or irritable 1 0 0 0  Afraid - awful might happen 0 0 0 0  Total GAD 7 Score 1 0 0 3  Anxiety Difficulty Not difficult at all  Not difficult at all        01/11/2022   10:10 AM 09/14/2021    2:06 PM  07/10/2021   10:32 AM  Depression screen PHQ 2/9  Decreased Interest 1 0 1  Down, Depressed, Hopeless 0 0 1  PHQ - 2 Score 1 0 2  Altered sleeping 2 2 3  $ Tired, decreased energy 2 2 3  $ Change in appetite 0 0 0  Feeling bad or failure about yourself  0 0 0  Trouble concentrating 0 0 0  Moving slowly or fidgety/restless 0 0 0  Suicidal thoughts 0 0 0  PHQ-9 Score 5 4 8  $ Difficult doing work/chores Not difficult at all Not difficult at all Somewhat difficult    BP Readings from Last 3 Encounters:  03/25/22 137/81  01/11/22 128/76  12/29/21 (!) 136/92    Physical Exam Vitals and nursing note reviewed.  Constitutional:      General: She is not in acute distress.    Appearance: She is well-developed.  HENT:     Head: Normocephalic and atraumatic.  Pulmonary:     Effort: Pulmonary effort is normal. No respiratory distress.  Skin:    General: Skin is warm and dry.     Findings: No rash.  Neurological:     Mental Status: She is alert and oriented to person, place, and time.  Psychiatric:        Mood and Affect: Mood normal.        Behavior: Behavior normal.     Wt Readings from Last 3 Encounters:  03/25/22 156 lb (70.8 kg)  01/11/22 157 lb (71.2 kg)  12/29/21 154 lb (69.9 kg)    There were no vitals taken for this visit.  Assessment and Plan: Problem List Items Addressed This Visit       Cardiovascular and Mediastinum   Essential hypertension (Chronic)    Clinically stable exam with well controlled BP on lisinopril hct. Tolerating medications without side effects. Pt to continue current regimen and low sodium diet.         Other   Prediabetes - Primary (Chronic)    Last A1C 65. Recommend low carb diet and weight loss        Partially dictated using Editor, commissioning. Any errors are unintentional.  Halina Maidens, MD [facility]  05/17/2022

## 2022-05-17 NOTE — Assessment & Plan Note (Deleted)
Clinically stable exam with well controlled BP on lisinopril hct. Tolerating medications without side effects. Pt to continue current regimen and low sodium diet.

## 2022-05-17 NOTE — Telephone Encounter (Signed)
Copied from [place]. Topic: Appointment Scheduling - Scheduling Inquiry for Clinic >> May 17, 2022 10:48 AM Oley Balm A wrote: Reason for CRM: Pt over slept and missed her appointment for today 05/17/22 at 10:40am. By accident I rescheduled that appointment for Monday instead of leaving it since pt called after appointment at 10:43am. I called over to the office and was advised to put the appointment back in, so I rescheduled that appointment.

## 2022-05-20 ENCOUNTER — Ambulatory Visit: Payer: BC Managed Care – PPO | Admitting: Internal Medicine

## 2022-05-20 ENCOUNTER — Encounter: Payer: Self-pay | Admitting: Internal Medicine

## 2022-05-20 VITALS — BP 128/78 | HR 85 | Ht 65.0 in | Wt 157.0 lb

## 2022-05-20 DIAGNOSIS — C50511 Malignant neoplasm of lower-outer quadrant of right female breast: Secondary | ICD-10-CM

## 2022-05-20 DIAGNOSIS — R7303 Prediabetes: Secondary | ICD-10-CM | POA: Diagnosis not present

## 2022-05-20 DIAGNOSIS — Z170 Estrogen receptor positive status [ER+]: Secondary | ICD-10-CM | POA: Diagnosis not present

## 2022-05-20 DIAGNOSIS — I10 Essential (primary) hypertension: Secondary | ICD-10-CM | POA: Diagnosis not present

## 2022-05-20 LAB — POCT GLYCOSYLATED HEMOGLOBIN (HGB A1C): Hemoglobin A1C: 6.4 % — AB (ref 4.0–5.6)

## 2022-05-20 NOTE — Assessment & Plan Note (Addendum)
A1C up to 6.5 last visit Advised diet changes and weight loss - she has reduced her carb intake and continues daily exercise walking the dog A1C improved today to 6.4 Will continue diet changes - recheck at [facility] in October unless earlier concerns arise

## 2022-05-20 NOTE — Assessment & Plan Note (Signed)
Severe fatigue with Tamoxifen and Femara Now on Aromasin and having less side effects

## 2022-05-20 NOTE — Progress Notes (Signed)
Date:  05/20/2022   Name:  Cheryl Mooney   DOB:  [birth year]   MRN:  [id number]   Chief Complaint: Diabetes  Diabetes She presents for her follow-up diabetic visit. Diabetes type: prediabetes. Her disease course has been improving. Pertinent negatives for hypoglycemia include no headaches or tremors. Pertinent negatives for diabetes include no chest pain, no fatigue, no polydipsia and no polyuria.  Hypertension This is a chronic problem. The problem is controlled. Pertinent negatives include no chest pain, headaches, palpitations or shortness of breath. Past treatments include ACE inhibitors and diuretics.    Lab Results  Component Value Date   NA 136 03/25/2022   K 3.9 03/25/2022   CO2 24 03/25/2022   GLUCOSE 146 (H) 03/25/2022   BUN 9 03/25/2022   CREATININE 0.60 03/25/2022   CALCIUM 9.3 03/25/2022   EGFR 97 01/11/2022   GFRNONAA >60 03/25/2022   Lab Results  Component Value Date   CHOL 233 (H) 01/11/2022   HDL 51 01/11/2022   LDLCALC 153 (H) 01/11/2022   TRIG 160 (H) 01/11/2022   CHOLHDL 4.6 (H) 01/11/2022   Lab Results  Component Value Date   TSH 1.920 01/11/2022   Lab Results  Component Value Date   HGBA1C 6.5 (H) 01/11/2022   Lab Results  Component Value Date   WBC 8.9 03/25/2022   HGB 14.4 03/25/2022   HCT 41.8 03/25/2022   MCV 92.7 03/25/2022   PLT 334 03/25/2022   Lab Results  Component Value Date   ALT 18 03/25/2022   AST 25 03/25/2022   ALKPHOS 83 03/25/2022   BILITOT 0.5 03/25/2022   Lab Results  Component Value Date   VD25OH 35.4 07/10/2021     Review of Systems  Constitutional:  Negative for appetite change, fatigue, fever and unexpected weight change.  HENT:  Negative for tinnitus and trouble swallowing.   Eyes:  Negative for visual disturbance.  Respiratory:  Negative for cough, chest tightness and shortness of breath.   Cardiovascular:  Negative for chest pain, palpitations and leg swelling.  Gastrointestinal:  Negative for  abdominal pain.  Endocrine: Negative for polydipsia and polyuria.  Genitourinary:  Negative for dysuria and hematuria.  Musculoskeletal:  Negative for arthralgias.  Neurological:  Negative for tremors, numbness and headaches.  Psychiatric/Behavioral:  Negative for dysphoric mood.     Patient Active Problem List   Diagnosis Date Noted   Gastroesophageal reflux disease 01/11/2022   S/P total hysterectomy and bilateral salpingo-oophorectomy 01/09/2021   Osteoporosis 01/09/2021   Breast asymmetry following reconstructive surgery 02/22/2020   Vitamin D deficiency 11/26/2019   Prediabetes 11/26/2019   S/P breast reconstruction, bilateral 11/07/2019   Carrier of high risk cancer gene mutation    Personal history of colonic polyps    Polyp of sigmoid colon    Acquired absence of bilateral breasts and nipples 07/06/2019   Goals of care, counseling/discussion 03/26/2019   Hyponatremia 03/08/2019   Constipation 03/08/2019   Chest wall pain following surgery 03/08/2019   BRCA2 gene mutation positive in female 12/30/2018   Family history of pancreatic cancer    Family history of lung cancer    Family history of BRCA2 gene positive    Family history of colon cancer    Malignant neoplasm of lower-outer quadrant of right breast of female, estrogen receptor positive ([place]) 12/17/2018   Hyperlipidemia, mixed 11/19/2016   Tubular adenoma of colon 02/21/2015   Tobacco use disorder 11/27/2014   Essential hypertension 11/27/2014   Hx of abnormal  cervical Pap smear 09/07/2011    No Known Allergies  Past Surgical History:  Procedure Laterality Date   AXILLARY SENTINEL NODE BIOPSY Bilateral 01/14/2019   Procedure: AXILLARY SENTINEL NODE BIOPSY;  Surgeon: Herbert Pun, MD;  Location: [facility];  Service: General;  Laterality: Bilateral;   BREAST BIOPSY Right 12/08/2018   [place] bx of mass with calcs path pending   BREAST BIOPSY Right 12/08/2018   [place] bx of LN, path pending   BREAST  RECONSTRUCTION WITH PLACEMENT OF TISSUE EXPANDER AND ALLODERM Bilateral 01/14/2019   Procedure: BILATERAL BREAST RECONSTRUCTION WITH PLACEMENT OF TISSUE EXPANDER AND FLEX HD;  Surgeon: Wallace Going, DO;  Location: [facility];  Service: Plastics;  Laterality: Bilateral;   COLONOSCOPY WITH PROPOFOL N/A 02/17/2015   Procedure: COLONOSCOPY WITH PROPOFOL;  Surgeon: Lucilla Lame, MD;  Location: [place];  Service: Endoscopy;  Laterality: N/A;   COLONOSCOPY WITH PROPOFOL N/A 07/12/2019   Procedure: COLONOSCOPY WITH BIOPSY;  Surgeon: Lucilla Lame, MD;  Location: [place];  Service: Endoscopy;  Laterality: N/A;  priority 3 PORT A CATH NEEDS ACCESSED   CYSTOSCOPY N/A 08/05/2019   Procedure: CYSTOSCOPY;  Surgeon: Homero Fellers, MD;  Location: [facility];  Service: Gynecology;  Laterality: N/A;   LIPOSUCTION WITH LIPOFILLING Bilateral 03/16/2020   Procedure: Bilateral Lipofilling of breasts for symmetry;  Surgeon: Wallace Going, DO;  Location: [place];  Service: Plastics;  Laterality: Bilateral;  1.5 hours   POLYPECTOMY  02/17/2015   Procedure: POLYPECTOMY;  Surgeon: Lucilla Lame, MD;  Location: [place];  Service: Endoscopy;;   POLYPECTOMY N/A 07/12/2019   Procedure: POLYPECTOMY;  Surgeon: Lucilla Lame, MD;  Location: [place];  Service: Endoscopy;  Laterality: N/A;   PORTA CATH REMOVAL Left 10/13/2019   Procedure: PORTA CATH REMOVAL;  Surgeon: Wallace Going, DO;  Location: [place];  Service: Plastics;  Laterality: Left;   PORTACATH PLACEMENT Left 02/18/2019   Procedure: INSERTION PORT-A-CATH;  Surgeon: Herbert Pun, MD;  Location: [facility];  Service: General;  Laterality: Left;   REMOVAL OF BILATERAL TISSUE EXPANDERS WITH PLACEMENT OF BILATERAL BREAST IMPLANTS Bilateral 10/13/2019   Procedure: REMOVAL OF BILATERAL TISSUE EXPANDERS WITH PLACEMENT OF BILATERAL BREAST IMPLANTS;  Surgeon:  Wallace Going, DO;  Location: [place];  Service: Plastics;  Laterality: Bilateral;  2 hours please   SIMPLE MASTECTOMY WITH AXILLARY SENTINEL NODE BIOPSY Left 01/14/2019   Procedure: LEFT SIMPLE MASTECTOMY;  Surgeon: Herbert Pun, MD;  Location: [facility];  Service: General;  Laterality: Left;   TOTAL LAPAROSCOPIC HYSTERECTOMY WITH BILATERAL SALPINGO OOPHORECTOMY Bilateral 08/05/2019   Procedure: TOTAL LAPAROSCOPIC HYSTERECTOMY WITH BILATERAL SALPINGO OOPHORECTOMY;  Surgeon: Homero Fellers, MD;  Location: [facility];  Service: Gynecology;  Laterality: Bilateral;   TOTAL MASTECTOMY Right 01/14/2019   Procedure: RIGHT TOTAL MASTECTOMY;  Surgeon: Herbert Pun, MD;  Location: [facility];  Service: General;  Laterality: Right;   WISDOM TOOTH EXTRACTION      Social History   Tobacco Use   Smoking status: Former    Packs/day: 0.05    Years: 20.00    Total pack years: 1.00    Types: Cigarettes   Smokeless tobacco: Never  Vaping Use   Vaping Use: Never used  Substance Use Topics   Alcohol use: Yes    Alcohol/week: 2.0 standard drinks of alcohol    Types: 2 Standard drinks or equivalent per week    Comment: occasional   Drug use: No  Medication list has been reviewed and updated.  Current Meds  Medication Sig   acetaminophen (TYLENOL) 500 MG tablet Take 2 tablets (1,000 mg total) by mouth every 6 (six) hours as needed for mild pain.   CALCIUM PO Take by mouth daily.   Calcium Polycarbophil 625 MG CHEW Chew by mouth.   cetirizine (ZYRTEC) 10 MG tablet Take 10 mg by mouth daily.   Cholecalciferol (VITAMIN D3 PO) Take by mouth daily.   DULoxetine (CYMBALTA) 60 MG capsule Take 1 capsule by mouth once daily   exemestane (AROMASIN) 25 MG tablet Take 1 tablet (25 mg total) by mouth daily after breakfast.   lisinopril-hydrochlorothiazide (ZESTORETIC) 20-25 MG tablet Take 1 tablet by mouth daily.   multivitamin-iron-minerals-folic acid (CENTRUM)  chewable tablet Chew 1 tablet by mouth daily.   [DISCONTINUED] omeprazole (PRILOSEC) 40 MG capsule Take 1 capsule by mouth once daily       05/20/2022   10:10 AM 01/11/2022   10:11 AM 09/14/2021    2:07 PM 07/10/2021   10:32 AM  GAD 7 : Generalized Anxiety Score  Nervous, Anxious, on Edge 0 0 0 0  Control/stop worrying 0 0 0 0  Worry too much - different things 0 0 0 0  Trouble relaxing 0 0 0 0  Restless 0 0 0 0  Easily annoyed or irritable 0 1 0 0  Afraid - awful might happen 0 0 0 0  Total GAD 7 Score 0 1 0 0  Anxiety Difficulty Not difficult at all Not difficult at all  Not difficult at all       05/20/2022   10:10 AM 01/11/2022   10:10 AM 09/14/2021    2:06 PM  Depression screen PHQ 2/9  Decreased Interest 0 1 0  Down, Depressed, Hopeless 0 0 0  PHQ - 2 Score 0 1 0  Altered sleeping 0 2 2  Tired, decreased energy 0 2 2  Change in appetite 0 0 0  Feeling bad or failure about yourself  0 0 0  Trouble concentrating 0 0 0  Moving slowly or fidgety/restless 0 0 0  Suicidal thoughts 0 0 0  PHQ-9 Score 0 5 4  Difficult doing work/chores Not difficult at all Not difficult at all Not difficult at all    BP Readings from Last 3 Encounters:  05/20/22 128/78  03/25/22 137/81  01/11/22 128/76    Physical Exam Vitals and nursing note reviewed.  Constitutional:      General: She is not in acute distress.    Appearance: Normal appearance. She is well-developed.  HENT:     Head: Normocephalic and atraumatic.  Cardiovascular:     Rate and Rhythm: Normal rate and regular rhythm.  Pulmonary:     Effort: Pulmonary effort is normal. No respiratory distress.     Breath sounds: No wheezing or rhonchi.  Musculoskeletal:     Cervical back: Normal range of motion.     Right lower leg: No edema.     Left lower leg: No edema.  Lymphadenopathy:     Cervical: No cervical adenopathy.  Skin:    General: Skin is warm and dry.     Findings: No rash.  Neurological:     Mental Status: She  is alert and oriented to person, place, and time.  Psychiatric:        Mood and Affect: Mood normal.        Behavior: Behavior normal.     Wt Readings from Last 3  Encounters:  05/20/22 157 lb (71.2 kg)  03/25/22 156 lb (70.8 kg)  01/11/22 157 lb (71.2 kg)    BP 128/78   Pulse 85   Ht 5' 5"$  (1.651 m)   Wt 157 lb (71.2 kg)   SpO2 96%   BMI 26.13 kg/m   Assessment and Plan: Problem List Items Addressed This Visit       Cardiovascular and Mediastinum   Essential hypertension (Chronic)    Clinically stable exam with well controlled BP on lisinopril hct. Tolerating medications without side effects. Pt to continue current regimen and low sodium diet.         Other   Malignant neoplasm of lower-outer quadrant of right breast of female, estrogen receptor positive ([place]) (Chronic)    Severe fatigue with Tamoxifen and Femara Now on Aromasin and having less side effects      Prediabetes - Primary (Chronic)    A1C up to 6.5 last visit Advised diet changes and weight loss - she has reduced her carb intake and continues daily exercise walking the dog A1C improved today to 6.4 Will continue diet changes - recheck at [facility] in October unless earlier concerns arise       Relevant Orders   POCT glycosylated hemoglobin (Hb A1C)     Partially dictated using Editor, commissioning. Any errors are unintentional.  Halina Maidens, MD [facility]  05/20/2022

## 2022-05-20 NOTE — Assessment & Plan Note (Signed)
Clinically stable exam with well controlled BP on lisinopril hct. Tolerating medications without side effects. Pt to continue current regimen and low sodium diet.

## 2022-07-29 ENCOUNTER — Ambulatory Visit: Payer: BC Managed Care – PPO | Admitting: Gastroenterology

## 2022-08-01 ENCOUNTER — Encounter: Payer: Self-pay | Admitting: Oncology

## 2022-08-12 ENCOUNTER — Ambulatory Visit: Payer: BC Managed Care – PPO | Admitting: Gastroenterology

## 2022-08-15 ENCOUNTER — Ambulatory Visit: Payer: BC Managed Care – PPO | Admitting: Gastroenterology

## 2022-08-26 ENCOUNTER — Ambulatory Visit: Payer: BC Managed Care – PPO | Admitting: Gastroenterology

## 2022-08-26 ENCOUNTER — Encounter: Payer: Self-pay | Admitting: Gastroenterology

## 2022-08-26 VITALS — BP 153/99 | HR 70 | Temp 98.1°F | Ht 65.0 in | Wt 152.0 lb

## 2022-08-26 DIAGNOSIS — R112 Nausea with vomiting, unspecified: Secondary | ICD-10-CM

## 2022-08-26 DIAGNOSIS — R1110 Vomiting, unspecified: Secondary | ICD-10-CM | POA: Diagnosis not present

## 2022-08-26 NOTE — Addendum Note (Signed)
Addended by: Roena Malady on: 08/26/2022 01:57 PM   Modules accepted: Orders

## 2022-08-26 NOTE — Progress Notes (Signed)
Gastroenterology Consultation  Referring Provider:     Reubin Milan, MD Primary Care Physician:  Reubin Milan, MD Primary Gastroenterologist:  Dr. Servando Snare     Reason for Consultation:     Regurgitation        HPI:   Cheryl Mooney is a 57 y.o. y/o female referred for consultation & management of regurgitation by Dr. Judithann Graves, Nyoka Cowden, MD. This patient was seen by me in the past in 2021 with a history of colon polyps and a repeat colonoscopy recommended in 5 years from the previous procedure.  The patient has a history of BRCA2 gene positive and is followed by oncology.  The patient was referred by hematology back in December for regurgitation.  The patient had been recommended to undergo an MRI of the abdomen due to a family history of pancreatic cancer. The food comes up just without her noticing. The patient reports that sometime anywhere from a few minutes to 20 minutes after she eats.  She reports that she has no warning and she will feel mucus and food, her esophagus and out her mouth.  There is only a few pounds weight loss but she says that she has not noticed any drastic weight loss.  She also reports that this has been going on for few months.  There is no report of any black stools or bloody stools.  She also denies any bring up of any blood.    Past Medical History:  Diagnosis Date   Anemia    things seem back to normal   Benign neoplasm of sigmoid colon    Benign neoplasm of transverse colon    Breast cancer (HCC)    Depression    Encounter for antineoplastic chemotherapy 12/30/2018   Pathogenic variant in BRCA2 called [id number] identified on the [facility] Breast Cancer STAT Panel. The STAT Breast cancer panel offered by [facility] includes sequencing and rearrangement analysis for the following 9 genes:  ATM, BRCA1, BRCA2, CDH1, CHEK2, PALB2, PTEN, STK11 and TP53.  The report date is 12/30/2018. Common Hereditary Cancer Panel results pending.    Family history of BRCA2  gene positive    Family history of breast cancer    Family history of colon cancer    Family history of lung cancer    Family history of pancreatic cancer    GERD (gastroesophageal reflux disease)    History of kidney stones    Hypertension    Malignant neoplasm of lower-outer quadrant of right breast of female, estrogen receptor positive (HCC) 12/17/2018   Smoker     Past Surgical History:  Procedure Laterality Date   AXILLARY SENTINEL NODE BIOPSY Bilateral 01/14/2019   Procedure: AXILLARY SENTINEL NODE BIOPSY;  Surgeon: Carolan Shiver, MD;  Location: [facility];  Service: General;  Laterality: Bilateral;   BREAST BIOPSY Right 12/08/2018   [place] bx of mass with calcs path pending   BREAST BIOPSY Right 12/08/2018   [place] bx of LN, path pending   BREAST RECONSTRUCTION WITH PLACEMENT OF TISSUE EXPANDER AND ALLODERM Bilateral 01/14/2019   Procedure: BILATERAL BREAST RECONSTRUCTION WITH PLACEMENT OF TISSUE EXPANDER AND FLEX HD;  Surgeon: Peggye Form, DO;  Location: [facility];  Service: Plastics;  Laterality: Bilateral;   COLONOSCOPY WITH PROPOFOL N/A 02/17/2015   Procedure: COLONOSCOPY WITH PROPOFOL;  Surgeon: Midge Minium, MD;  Location: [facility];  Service: Endoscopy;  Laterality: N/A;   COLONOSCOPY WITH PROPOFOL N/A 07/12/2019   Procedure: COLONOSCOPY WITH BIOPSY;  Surgeon: Servando Snare,  Yatziri Wainwright, MD;  Location: [facility];  Service: Endoscopy;  Laterality: N/A;  priority 3 PORT A CATH NEEDS ACCESSED   CYSTOSCOPY N/A 08/05/2019   Procedure: CYSTOSCOPY;  Surgeon: Natale Milch, MD;  Location: [facility];  Service: Gynecology;  Laterality: N/A;   LIPOSUCTION WITH LIPOFILLING Bilateral 03/16/2020   Procedure: Bilateral Lipofilling of breasts for symmetry;  Surgeon: Peggye Form, DO;  Location: [facility];  Service: Plastics;  Laterality: Bilateral;  1.5 hours   POLYPECTOMY  02/17/2015   Procedure: POLYPECTOMY;  Surgeon: Midge Minium, MD;   Location: [facility];  Service: Endoscopy;;   POLYPECTOMY N/A 07/12/2019   Procedure: POLYPECTOMY;  Surgeon: Midge Minium, MD;  Location: [facility];  Service: Endoscopy;  Laterality: N/A;   PORTA CATH REMOVAL Left 10/13/2019   Procedure: PORTA CATH REMOVAL;  Surgeon: Peggye Form, DO;  Location: [facility];  Service: Plastics;  Laterality: Left;   PORTACATH PLACEMENT Left 02/18/2019   Procedure: INSERTION PORT-A-CATH;  Surgeon: Carolan Shiver, MD;  Location: [facility];  Service: General;  Laterality: Left;   REMOVAL OF BILATERAL TISSUE EXPANDERS WITH PLACEMENT OF BILATERAL BREAST IMPLANTS Bilateral 10/13/2019   Procedure: REMOVAL OF BILATERAL TISSUE EXPANDERS WITH PLACEMENT OF BILATERAL BREAST IMPLANTS;  Surgeon: Peggye Form, DO;  Location: [facility];  Service: Plastics;  Laterality: Bilateral;  2 hours please   SIMPLE MASTECTOMY WITH AXILLARY SENTINEL NODE BIOPSY Left 01/14/2019   Procedure: LEFT SIMPLE MASTECTOMY;  Surgeon: Carolan Shiver, MD;  Location: [facility];  Service: General;  Laterality: Left;   TOTAL LAPAROSCOPIC HYSTERECTOMY WITH BILATERAL SALPINGO OOPHORECTOMY Bilateral 08/05/2019   Procedure: TOTAL LAPAROSCOPIC HYSTERECTOMY WITH BILATERAL SALPINGO OOPHORECTOMY;  Surgeon: Natale Milch, MD;  Location: [facility];  Service: Gynecology;  Laterality: Bilateral;   TOTAL MASTECTOMY Right 01/14/2019   Procedure: RIGHT TOTAL MASTECTOMY;  Surgeon: Carolan Shiver, MD;  Location: [facility];  Service: General;  Laterality: Right;   WISDOM TOOTH EXTRACTION      Prior to Admission medications   Medication Sig Start Date End Date Taking? Authorizing Provider  acetaminophen (TYLENOL) 500 MG tablet Take 2 tablets (1,000 mg total) by mouth every 6 (six) hours as needed for mild pain. 08/05/19   Schuman, Jaquelyn Bitter, MD  CALCIUM PO Take by mouth daily.    [provider]  Calcium Polycarbophil 625 MG  CHEW Chew by mouth.    [provider]  cetirizine (ZYRTEC) 10 MG tablet Take 10 mg by mouth daily.    [provider]  Cholecalciferol (VITAMIN D3 PO) Take by mouth daily.    [provider]  DULoxetine (CYMBALTA) 60 MG capsule Take 1 capsule by mouth once daily 04/04/22   Rickard Patience, MD  exemestane (AROMASIN) 25 MG tablet Take 1 tablet (25 mg total) by mouth daily after breakfast. 03/25/22   Rickard Patience, MD  lisinopril-hydrochlorothiazide (ZESTORETIC) 20-25 MG tablet Take 1 tablet by mouth daily. 01/11/22   Reubin Milan, MD  multivitamin-iron-minerals-folic acid (CENTRUM) chewable tablet Chew 1 tablet by mouth daily.    [provider]    Family History  Problem Relation Age of Onset   Colon cancer Mother 63   Breast cancer Mother 80       "BRCA2+"   Hypertension Father    Stroke Father    Alzheimer's disease Father    Prostate cancer Father    Breast cancer Maternal Aunt        mat great aunt  Breast cancer Maternal Grandmother 58   Pancreatic cancer Maternal Grandfather 29     Social History   Tobacco Use   Smoking status: Former    Packs/day: 0.05    Years: 20.00    Additional pack years: 0.00    Total pack years: 1.00    Types: Cigarettes   Smokeless tobacco: Never  Vaping Use   Vaping Use: Never used  Substance Use Topics   Alcohol use: Yes    Alcohol/week: 2.0 standard drinks of alcohol    Types: 2 Standard drinks or equivalent per week    Comment: occasional   Drug use: No    Allergies as of 08/26/2022   (No Known Allergies)    Review of Systems:    All systems reviewed and negative except where noted in HPI.   Physical Exam:  There were no vitals taken for this visit. No LMP recorded. Patient has had a hysterectomy. General:   Alert,  Well-developed, well-nourished, pleasant and cooperative in NAD Head:  Normocephalic and atraumatic. Eyes:  Sclera clear, no icterus.   Conjunctiva pink. Ears:  Normal auditory  acuity. Neck:  Supple; no masses or thyromegaly. Lungs:  Respirations even and unlabored.  Clear throughout to auscultation.   No wheezes, crackles, or rhonchi. No acute distress. Heart:  Regular rate and rhythm; no murmurs, clicks, rubs, or gallops. Abdomen:  Normal bowel sounds.  No bruits.  Soft, non-tender and non-distended without masses, hepatosplenomegaly or hernias noted.  No guarding or rebound tenderness.  Negative Carnett sign.   Rectal:  Deferred.  Pulses:  Normal pulses noted. Extremities:  No clubbing or edema.  No cyanosis. Neurologic:  Alert and oriented x3;  grossly normal neurologically. Skin:  Intact without significant lesions or rashes.  No jaundice. Lymph Nodes:  No significant cervical adenopathy. Psych:  Alert and cooperative. Normal mood and affect.  Imaging Studies: No results found.  Assessment and Plan:   Cheryl Mooney is a 57 y.o. y/o female who comes in today with regurgitation/rumination.  She states that it is sometimes worse with her allergies such as pollen.  She has a history of BRCA2 gene and colon polyps.  The patient will be set up for an upper endoscopy to look for any structural abnormalities as the cause of her symptoms.  The patient states that she has tried a PPI in the past and thinks it was omeprazole and reports it did not help her symptoms whatsoever.  The patient will follow-up with time of the procedure.  Patient has been explained the plan agrees with it.    Midge Minium, MD. Clementeen Graham    Note: This dictation was prepared with Dragon dictation along with smaller phrase technology. Any transcriptional errors that result from this process are unintentional.

## 2022-08-28 ENCOUNTER — Other Ambulatory Visit: Payer: Self-pay

## 2022-08-28 ENCOUNTER — Encounter: Payer: Self-pay | Admitting: Gastroenterology

## 2022-08-28 NOTE — Anesthesia Preprocedure Evaluation (Addendum)
Anesthesia Evaluation  Patient identified by MRN, date of birth, ID band Patient awake    Reviewed: Allergy & Precautions, H&P , NPO status , Patient's Chart, lab work & pertinent test results  Airway Mallampati: III  TM Distance: <3 FB Neck ROM: Full    Dental no notable dental hx.    Pulmonary Current Smoker and Patient abstained from smoking.   Pulmonary exam normal breath sounds clear to auscultation       Cardiovascular hypertension, Normal cardiovascular exam Rhythm:Regular Rate:Normal     Neuro/Psych  PSYCHIATRIC DISORDERS  Depression    negative neurological ROS     GI/Hepatic Neg liver ROS,GERD  ,,  Endo/Other  negative endocrine ROS    Renal/GU negative Renal ROS  negative genitourinary   Musculoskeletal negative musculoskeletal ROS (+)    Abdominal   Peds negative pediatric ROS (+)  Hematology  (+) Blood dyscrasia, anemia   Anesthesia Other Findings Hypertension  Smoker Benign neoplasm of sigmoid colon  Benign neoplasm of transverse colon Family history of breast cancer  Family history of pancreatic cancer Family history of lung cancer Family history of BRCA2 gene positive Family history of colon cancer GERD (gastroesophageal reflux disease) History of kidney stones Malignant neoplasm of lower-outer quadrant of right breast of female, estrogen receptor positive (HCC) Anemia Depression  Encounter for antineoplastic chemotherapy Seasonal allergies  Pre-diabetes    Reproductive/Obstetrics negative OB ROS                             Anesthesia Physical Anesthesia Plan  ASA: 3  Anesthesia Plan: General   Post-op Pain Management:    Induction: Intravenous  PONV Risk Score and Plan:   Airway Management Planned: Natural Airway and Nasal Cannula  Additional Equipment:   Intra-op Plan:   Post-operative Plan:   Informed Consent: I have reviewed the patients  History and Physical, chart, labs and discussed the procedure including the risks, benefits and alternatives for the proposed anesthesia with the patient or authorized representative who has indicated his/her understanding and acceptance.     Dental Advisory Given  Plan Discussed with: Anesthesiologist, CRNA and Surgeon  Anesthesia Plan Comments: (Patient consented for risks of anesthesia including but not limited to:  - adverse reactions to medications - risk of airway placement if required - damage to eyes, teeth, lips or other oral mucosa - nerve damage due to positioning  - sore throat or hoarseness - Damage to heart, brain, nerves, lungs, other parts of body or loss of life  Patient voiced understanding.)       Anesthesia Quick Evaluation

## 2022-08-29 ENCOUNTER — Other Ambulatory Visit
Admission: RE | Admit: 2022-08-29 | Discharge: 2022-08-29 | Disposition: A | Discharge status: Home / Self Care | Payer: BC Managed Care – PPO | Attending: Urgent Care | Admitting: Urgent Care

## 2022-08-29 DIAGNOSIS — Z01812 Encounter for preprocedural laboratory examination: Secondary | ICD-10-CM | POA: Diagnosis present

## 2022-08-29 DIAGNOSIS — Z79899 Other long term (current) drug therapy: Secondary | ICD-10-CM | POA: Insufficient documentation

## 2022-08-29 LAB — POTASSIUM: Potassium: 3.5 mmol/L (ref 3.5–5.1)

## 2022-09-05 ENCOUNTER — Other Ambulatory Visit: Payer: Self-pay

## 2022-09-05 ENCOUNTER — Ambulatory Visit: Payer: BC Managed Care – PPO | Admitting: Anesthesiology

## 2022-09-05 ENCOUNTER — Ambulatory Visit
Admission: RE | Admit: 2022-09-05 | Discharge: 2022-09-05 | Disposition: A | Discharge status: Home / Self Care | Payer: BC Managed Care – PPO | Attending: Gastroenterology | Admitting: Gastroenterology

## 2022-09-05 ENCOUNTER — Encounter
Admission: RE | Disposition: A | Discharge status: Home / Self Care | Payer: Self-pay | Source: Home / Self Care | Attending: Gastroenterology

## 2022-09-05 ENCOUNTER — Other Ambulatory Visit: Payer: Self-pay | Admitting: Oncology

## 2022-09-05 ENCOUNTER — Encounter: Payer: Self-pay | Admitting: Gastroenterology

## 2022-09-05 DIAGNOSIS — R112 Nausea with vomiting, unspecified: Secondary | ICD-10-CM

## 2022-09-05 DIAGNOSIS — K2980 Duodenitis without bleeding: Secondary | ICD-10-CM | POA: Diagnosis not present

## 2022-09-05 DIAGNOSIS — Z79899 Other long term (current) drug therapy: Secondary | ICD-10-CM

## 2022-09-05 DIAGNOSIS — K449 Diaphragmatic hernia without obstruction or gangrene: Secondary | ICD-10-CM | POA: Diagnosis not present

## 2022-09-05 DIAGNOSIS — K2970 Gastritis, unspecified, without bleeding: Secondary | ICD-10-CM | POA: Diagnosis not present

## 2022-09-05 DIAGNOSIS — K317 Polyp of stomach and duodenum: Secondary | ICD-10-CM | POA: Insufficient documentation

## 2022-09-05 DIAGNOSIS — K2100 Gastro-esophageal reflux disease with esophagitis, without bleeding: Secondary | ICD-10-CM | POA: Insufficient documentation

## 2022-09-05 DIAGNOSIS — F17210 Nicotine dependence, cigarettes, uncomplicated: Secondary | ICD-10-CM | POA: Insufficient documentation

## 2022-09-05 DIAGNOSIS — Z01812 Encounter for preprocedural laboratory examination: Secondary | ICD-10-CM

## 2022-09-05 HISTORY — PX: ESOPHAGOGASTRODUODENOSCOPY (EGD) WITH PROPOFOL: SHX5813

## 2022-09-05 HISTORY — DX: Prediabetes: R73.03

## 2022-09-05 HISTORY — DX: Nausea with vomiting, unspecified: R11.2

## 2022-09-05 HISTORY — DX: Other seasonal allergic rhinitis: J30.2

## 2022-09-05 SURGERY — ESOPHAGOGASTRODUODENOSCOPY (EGD) WITH PROPOFOL
Anesthesia: General

## 2022-09-05 MED ORDER — SODIUM CHLORIDE 0.9 % IV SOLN: INTRAVENOUS | Status: DC

## 2022-09-05 MED ORDER — PROPOFOL 10 MG/ML IV BOLUS
INTRAVENOUS | Status: DC | PRN
  Administered 2022-09-05: 80 mg via INTRAVENOUS
  Administered 2022-09-05: 50 mg via INTRAVENOUS

## 2022-09-05 MED ORDER — LIDOCAINE HCL (CARDIAC) PF 100 MG/5ML IV SOSY
PREFILLED_SYRINGE | INTRAVENOUS | Status: DC | PRN
  Administered 2022-09-05: 30 mg via INTRAVENOUS

## 2022-09-05 MED ORDER — LACTATED RINGERS IV SOLN: INTRAVENOUS | Status: DC

## 2022-09-05 MED ORDER — STERILE WATER FOR IRRIGATION IR SOLN
Status: DC | PRN
  Administered 2022-09-05: 1000 mL

## 2022-09-05 SURGICAL SUPPLY — 32 items
BALLN DILATOR 12-15 8 (BALLOONS)
BALLN DILATOR 15-18 8 (BALLOONS)
BALLN DILATOR CRE 0-12 8 (BALLOONS)
BALLN DILATOR ESOPH 8 10 CRE (MISCELLANEOUS) IMPLANT
BALLOON DILATOR 12-15 8 (BALLOONS) IMPLANT
BALLOON DILATOR 15-18 8 (BALLOONS) IMPLANT
BALLOON DILATOR CRE 0-12 8 (BALLOONS) IMPLANT
BLOCK BITE 60FR ADLT L/F GRN (MISCELLANEOUS) ×1 IMPLANT
CLIP HMST 235XBRD CATH ROT (MISCELLANEOUS) IMPLANT
CLIP RESOLUTION 360 11X235 (MISCELLANEOUS)
ELECT REM PT RETURN 9FT ADLT (ELECTROSURGICAL)
ELECTRODE REM PT RTRN 9FT ADLT (ELECTROSURGICAL) IMPLANT
FCP ESCP3.2XJMB 240X2.8X (MISCELLANEOUS) ×1
FORCEPS BIOP RAD 4 LRG CAP 4 (CUTTING FORCEPS) IMPLANT
FORCEPS BIOP RJ4 240 W/NDL (MISCELLANEOUS) ×1
FORCEPS ESCP3.2XJMB 240X2.8X (MISCELLANEOUS) IMPLANT
GOWN CVR UNV OPN BCK APRN NK (MISCELLANEOUS) ×2 IMPLANT
GOWN ISOL THUMB LOOP REG UNIV (MISCELLANEOUS) ×2
INJECTOR VARIJECT VIN23 (MISCELLANEOUS) IMPLANT
KIT DEFENDO VALVE AND CONN (KITS) IMPLANT
KIT PRC NS LF DISP ENDO (KITS) ×1 IMPLANT
KIT PROCEDURE OLYMPUS (KITS) ×1
MANIFOLD NEPTUNE II (INSTRUMENTS) ×1 IMPLANT
MARKER SPOT ENDO TATTOO 5ML (MISCELLANEOUS) IMPLANT
RETRIEVER NET PLAT FOOD (MISCELLANEOUS) IMPLANT
SNARE SHORT THROW 13M SML OVAL (MISCELLANEOUS) IMPLANT
SNARE SHORT THROW 30M LRG OVAL (MISCELLANEOUS) IMPLANT
SYR INFLATION 60ML (SYRINGE) IMPLANT
TRAP ETRAP POLY (MISCELLANEOUS) IMPLANT
VARIJECT INJECTOR VIN23 (MISCELLANEOUS)
WATER STERILE IRR 250ML POUR (IV SOLUTION) ×1 IMPLANT
WIRE CRE 18-20MM 8CM F G (MISCELLANEOUS) IMPLANT

## 2022-09-05 NOTE — Anesthesia Postprocedure Evaluation (Signed)
Anesthesia Post Note  Patient: Cheryl Mooney  Procedure(s) Performed: ESOPHAGOGASTRODUODENOSCOPY (EGD) WITH PROPOFOL polypectomy  Patient location during evaluation: PACU Anesthesia Type: General Level of consciousness: awake and alert Pain management: pain level controlled Vital Signs Assessment: post-procedure vital signs reviewed and stable Respiratory status: spontaneous breathing, nonlabored ventilation, respiratory function stable and patient connected to nasal cannula oxygen Cardiovascular status: blood pressure returned to baseline and stable Postop Assessment: no apparent nausea or vomiting Anesthetic complications: no   No notable events documented.   Last Vitals:  Vitals:   09/05/22 1027 09/05/22 1037  BP:  (!) 140/85  Pulse:  78  Resp: (!) 23 (!) 21  Temp: 36.5 C 36.5 C  SpO2:  96%    Last Pain:  Vitals:   09/05/22 1037  TempSrc:   PainSc: 0-No pain                 Eliezer Khawaja C Dhanvin Szeto

## 2022-09-05 NOTE — Transfer of Care (Signed)
Immediate Anesthesia Transfer of Care Note  Patient: Cheryl Mooney  Procedure(s) Performed: ESOPHAGOGASTRODUODENOSCOPY (EGD) WITH PROPOFOL polypectomy  Patient Location: PACU  Anesthesia Type: General  Level of Consciousness: awake, alert  and patient cooperative  Airway and Oxygen Therapy: Patient Spontanous Breathing and Patient connected to supplemental oxygen  Post-op Assessment: Post-op Vital signs reviewed, Patient's Cardiovascular Status Stable, Respiratory Function Stable, Patent Airway and No signs of Nausea or vomiting  Post-op Vital Signs: Reviewed and stable  Complications: No notable events documented.

## 2022-09-05 NOTE — Op Note (Signed)
[facility]enter[facility] Patient Name: Cheryl Mooney Procedure Date: 09/05/2022 10:05 AM MRN: [id number] Account #: [id number] Date of Birth: Jul 20, 1965 Admit Type: Outpatient Age: 57 Room: [facility] OR ROOM 01 Gender: Female Note Status: Finalized Instrument Name: [id number] Procedure:             Upper GI endoscopy Indications:           Suspected esophageal reflux Providers:             Midge Minium MD, MD Referring MD:          Midge Minium MD, MD (Referring MD), Bari Edward, MD                         (Referring MD) Medicines:             Propofol per Anesthesia Complications:         No immediate complications. Procedure:             Pre-Anesthesia Assessment:                        - Prior to the procedure, a History and Physical was                         performed, and patient medications and allergies were                         reviewed. The patient's tolerance of previous                         anesthesia was also reviewed. The risks and benefits                         of the procedure and the sedation options and risks                         were discussed with the patient. All questions were                         answered, and informed consent was obtained. Prior                         Anticoagulants: The patient has taken no anticoagulant                         or antiplatelet agents. ASA Grade Assessment: II - A                         patient with mild systemic disease. After reviewing                         the risks and benefits, the patient was deemed in                         satisfactory condition to undergo the procedure.                        After obtaining informed consent, the endoscope was  passed under direct vision. Throughout the procedure,                         the patient's blood pressure, pulse, and oxygen                         saturations were monitored continuously. The Endoscope                          was introduced through the mouth, and advanced to the                         second part of duodenum. The upper GI endoscopy was                         accomplished without difficulty. The patient tolerated                         the procedure well. Findings:      A small hiatal hernia was present.      LA Grade A (one or more mucosal breaks less than 5 mm, not extending       between tops of 2 mucosal folds) esophagitis with no bleeding was found       at the gastroesophageal junction. Biopsies were taken with a cold       forceps for histology.      Localized mild inflammation characterized by erosions was found in the       gastric antrum. Biopsies were taken with a cold forceps for histology.      Multiple 5 to 9 mm sessile polyps were found in the duodenal bulb.       Biopsies were taken with a cold forceps for histology. Impression:            - Small hiatal hernia.                        - LA Grade A reflux esophagitis with no bleeding.                         Biopsied.                        - Gastritis. Biopsied.                        - Multiple duodenal polyps. Biopsied. Recommendation:        - Discharge patient to home.                        - Resume previous diet.                        - Continue present medications.                        - Await pathology results. Procedure Code(s):     --- Professional ---                        [id number], Esophagogastroduodenoscopy, flexible,  transoral; with biopsy, single or multiple Diagnosis Code(s):     --- Professional ---                        K21.00, Gastro-esophageal reflux disease with                         esophagitis, without bleeding                        K29.70, Gastritis, unspecified, without bleeding                        K31.7, Polyp of stomach and duodenum CPT copyright 2022 [employer]. All rights reserved. The codes documented in this report are preliminary  and upon coder review may  be revised to meet current compliance requirements. Midge Minium MD, MD 09/05/2022 10:24:40 AM This report has been signed electronically. Number of Addenda: 0 Note Initiated On: 09/05/2022 10:05 AM Total Procedure Duration: 0 hours 4 minutes 16 seconds  Estimated Blood Loss:  Estimated blood loss: none.      [facility]

## 2022-09-05 NOTE — H&P (Signed)
Cheryl Minium, MD [facility] [address] [place], [place] [address] Phone:[phone] Fax : [phone]  Primary Care Physician:  Reubin Milan, MD Primary Gastroenterologist:  Dr. Servando Snare  Pre-Procedure History & Physical: HPI:  Cheryl Mooney is a 57 y.o. female is here for an endoscopy.   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 The STAT Breast cancer panel offered by [facility] includes sequencing and rearrangement analysis for the following 9 genes:  ATM, BRCA1, BRCA2, CDH1, CHEK2, PALB2, PTEN, STK11 and TP53.  The report date is 12/30/2018. Common Hereditary Cancer Panel results pending.    Family history of BRCA2 gene positive    Family history of breast cancer    Family history of colon cancer    Family history of lung cancer    Family history of pancreatic cancer    GERD (gastroesophageal reflux disease)    History of kidney stones    Hypertension    Malignant neoplasm of lower-outer quadrant of right breast of female, estrogen receptor positive (HCC) 12/17/2018   Pre-diabetes    Seasonal allergies    Smoker     Past Surgical History:  Procedure Laterality Date   AXILLARY SENTINEL NODE BIOPSY Bilateral 01/14/2019   Procedure: AXILLARY SENTINEL NODE BIOPSY;  Surgeon: Carolan Shiver, MD;  Location: [facility];  Service: General;  Laterality: Bilateral;   BREAST BIOPSY Right 12/08/2018   [place] bx of mass with calcs path pending   BREAST BIOPSY Right 12/08/2018   [place] bx of LN, path pending   BREAST RECONSTRUCTION WITH PLACEMENT OF TISSUE EXPANDER AND ALLODERM Bilateral 01/14/2019   Procedure: BILATERAL BREAST RECONSTRUCTION WITH PLACEMENT OF  TISSUE EXPANDER AND FLEX HD;  Surgeon: Peggye Form, DO;  Location: [facility];  Service: Plastics;  Laterality: Bilateral;   COLONOSCOPY WITH PROPOFOL N/A 02/17/2015   Procedure: COLONOSCOPY WITH PROPOFOL;  Surgeon: Cheryl Minium, MD;  Location: [facility];  Service: Endoscopy;  Laterality: N/A;   COLONOSCOPY WITH PROPOFOL N/A 07/12/2019   Procedure: COLONOSCOPY WITH BIOPSY;  Surgeon: Cheryl Minium, MD;  Location: [facility];  Service: Endoscopy;  Laterality: N/A;  priority 3 PORT A CATH NEEDS ACCESSED   CYSTOSCOPY N/A 08/05/2019   Procedure: CYSTOSCOPY;  Surgeon: Natale Milch, MD;  Location: [facility];  Service: Gynecology;  Laterality: N/A;   LIPOSUCTION WITH LIPOFILLING Bilateral 03/16/2020   Procedure: Bilateral Lipofilling of breasts for symmetry;  Surgeon: Peggye Form, DO;  Location: [facility];  Service: Plastics;  Laterality: Bilateral;  1.5 hours   POLYPECTOMY  02/17/2015   Procedure: POLYPECTOMY;  Surgeon: Cheryl Minium, MD;  Location: [facility];  Service: Endoscopy;;   POLYPECTOMY N/A 07/12/2019   Procedure: POLYPECTOMY;  Surgeon: Cheryl Minium, MD;  Location: [facility];  Service: Endoscopy;  Laterality: N/A;   PORTA CATH REMOVAL Left 10/13/2019   Procedure: PORTA CATH REMOVAL;  Surgeon: Peggye Form, DO;  Location: [facility];  Service: Plastics;  Laterality: Left;   PORTACATH PLACEMENT Left 02/18/2019   Procedure: INSERTION PORT-A-CATH;  Surgeon: Carolan Shiver, MD;  Location: [facility];  Service: General;  Laterality: Left;  REMOVAL OF BILATERAL TISSUE EXPANDERS WITH PLACEMENT OF BILATERAL BREAST IMPLANTS Bilateral 10/13/2019   Procedure: REMOVAL OF BILATERAL TISSUE EXPANDERS WITH PLACEMENT OF BILATERAL BREAST IMPLANTS;  Surgeon: Peggye Form, DO;  Location: [facility];  Service: Plastics;  Laterality: Bilateral;  2 hours please   SIMPLE MASTECTOMY WITH  AXILLARY SENTINEL NODE BIOPSY Left 01/14/2019   Procedure: LEFT SIMPLE MASTECTOMY;  Surgeon: Carolan Shiver, MD;  Location: [facility];  Service: General;  Laterality: Left;   TOTAL LAPAROSCOPIC HYSTERECTOMY WITH BILATERAL SALPINGO OOPHORECTOMY Bilateral 08/05/2019   Procedure: TOTAL LAPAROSCOPIC HYSTERECTOMY WITH BILATERAL SALPINGO OOPHORECTOMY;  Surgeon: Natale Milch, MD;  Location: [facility];  Service: Gynecology;  Laterality: Bilateral;   TOTAL MASTECTOMY Right 01/14/2019   Procedure: RIGHT TOTAL MASTECTOMY;  Surgeon: Carolan Shiver, MD;  Location: [facility];  Service: General;  Laterality: Right;   WISDOM TOOTH EXTRACTION      Prior to Admission medications   Medication Sig Start Date End Date Taking? Authorizing Provider  acetaminophen (TYLENOL) 500 MG tablet Take 2 tablets (1,000 mg total) by mouth every 6 (six) hours as needed for mild pain. 08/05/19   Schuman, Jaquelyn Bitter, MD  CALCIUM PO Take by mouth daily.    [provider]  Calcium Polycarbophil 625 MG CHEW Chew by mouth.    [provider]  cetirizine (ZYRTEC) 10 MG tablet Take 10 mg by mouth daily. Patient not taking: Reported on 08/28/2022    [provider]  Cholecalciferol (VITAMIN D3 PO) Take by mouth daily.    [provider]  DULoxetine (CYMBALTA) 60 MG capsule Take 1 capsule by mouth once daily 04/04/22   Rickard Patience, MD  exemestane (AROMASIN) 25 MG tablet Take 1 tablet (25 mg total) by mouth daily after breakfast. 03/25/22   Rickard Patience, MD  lisinopril-hydrochlorothiazide (ZESTORETIC) 20-25 MG tablet Take 1 tablet by mouth daily. 01/11/22   Reubin Milan, MD  multivitamin-iron-minerals-folic acid (CENTRUM) chewable tablet Chew 1 tablet by mouth daily.    [provider]    Allergies as of 08/26/2022   (No Known Allergies)    Family History  Problem Relation Age of Onset   Colon cancer Mother 63   Breast cancer Mother 59       "BRCA2+"   Hypertension  Father    Stroke Father    Alzheimer's disease Father    Prostate cancer Father    Breast cancer Maternal Aunt        mat great aunt   Breast cancer Maternal Grandmother 54   Pancreatic cancer Maternal Grandfather 34    Social History   Socioeconomic History   Marital status: Married    Spouse name: Jonny Ruiz   Number of children: Not on file   Years of education: Not on file   Highest education level: Not on file  Occupational History   Occupation: Investment banker, corporate    Comment: retired  Tobacco Use   Smoking status: Some Days    Packs/day: 0.25    Years: 20.00    Additional pack years: 0.00    Total pack years: 5.00    Types: Cigarettes   Smokeless tobacco: Never  Vaping Use   Vaping Use: Never used  Substance and Sexual Activity   Alcohol use: Yes    Alcohol/week: 2.0 standard drinks of alcohol    Types: 2 Standard drinks or equivalent per week    Comment: occasional   Drug use: No   Sexual activity: Yes    Birth  control/protection: Surgical  Other Topics Concern   Not on file  Social History Narrative   Patient lives with husband.   Social Determinants of Health   Financial Resource Strain: Low Risk  (09/14/2021)   Overall Financial Resource Strain (CARDIA)    Difficulty of Paying Living Expenses: Not hard at all  Food Insecurity: No Food Insecurity (09/14/2021)   Hunger Vital Sign    Worried About Running Out of Food in the Last Year: Never true    Ran Out of Food in the Last Year: Never true  Transportation Needs: No Transportation Needs (09/14/2021)   PRAPARE - Administrator, Civil Service (Medical): No    Lack of Transportation (Non-Medical): No  Physical Activity: Not on file  Stress: Not on file  Social Connections: Not on file  Intimate Partner Violence: Not At Risk (09/14/2021)   Humiliation, Afraid, Rape, and Kick questionnaire    Fear of Current or Ex-Partner: No    Emotionally Abused: No    Physically Abused: No    Sexually Abused: No     Review of Systems: See HPI, otherwise negative ROS  Physical Exam: Ht 5\' 4"  (1.626 m)   Wt 68.9 kg   BMI 26.09 kg/m  General:   Alert,  pleasant and cooperative in NAD Head:  Normocephalic and atraumatic. Neck:  Supple; no masses or thyromegaly. Lungs:  Clear throughout to auscultation.    Heart:  Regular rate and rhythm. Abdomen:  Soft, nontender and nondistended. Normal bowel sounds, without guarding, and without rebound.   Neurologic:  Alert and  oriented x4;  grossly normal neurologically.  Impression/Plan: Latricia Heft is here for an endoscopy to be performed for GERD  Risks, benefits, limitations, and alternatives regarding  endoscopy have been reviewed with the patient.  Questions have been answered.  All parties agreeable.   Cheryl Minium, MD  09/05/2022, 9:40 AM

## 2022-09-06 ENCOUNTER — Encounter: Payer: Self-pay | Admitting: Gastroenterology

## 2022-09-13 ENCOUNTER — Other Ambulatory Visit: Payer: Self-pay

## 2022-09-15 ENCOUNTER — Encounter: Payer: Self-pay | Admitting: Gastroenterology

## 2022-09-24 ENCOUNTER — Inpatient Hospital Stay: Payer: BC Managed Care – PPO

## 2022-09-24 ENCOUNTER — Encounter: Payer: Self-pay | Admitting: Oncology

## 2022-09-24 ENCOUNTER — Inpatient Hospital Stay: Payer: BC Managed Care – PPO | Attending: Oncology | Admitting: Oncology

## 2022-09-24 VITALS — BP 155/85 | HR 79 | Temp 98.1°F | Resp 18 | Wt 151.6 lb

## 2022-09-24 DIAGNOSIS — M810 Age-related osteoporosis without current pathological fracture: Secondary | ICD-10-CM | POA: Diagnosis not present

## 2022-09-24 DIAGNOSIS — Z79899 Other long term (current) drug therapy: Secondary | ICD-10-CM | POA: Diagnosis not present

## 2022-09-24 DIAGNOSIS — Z79811 Long term (current) use of aromatase inhibitors: Secondary | ICD-10-CM | POA: Diagnosis not present

## 2022-09-24 DIAGNOSIS — M818 Other osteoporosis without current pathological fracture: Secondary | ICD-10-CM

## 2022-09-24 DIAGNOSIS — Z90710 Acquired absence of both cervix and uterus: Secondary | ICD-10-CM | POA: Diagnosis not present

## 2022-09-24 DIAGNOSIS — Z90722 Acquired absence of ovaries, bilateral: Secondary | ICD-10-CM | POA: Diagnosis not present

## 2022-09-24 DIAGNOSIS — C50511 Malignant neoplasm of lower-outer quadrant of right female breast: Secondary | ICD-10-CM

## 2022-09-24 DIAGNOSIS — Z170 Estrogen receptor positive status [ER+]: Secondary | ICD-10-CM | POA: Diagnosis present

## 2022-09-24 DIAGNOSIS — Z9079 Acquired absence of other genital organ(s): Secondary | ICD-10-CM | POA: Diagnosis not present

## 2022-09-24 DIAGNOSIS — Z803 Family history of malignant neoplasm of breast: Secondary | ICD-10-CM | POA: Insufficient documentation

## 2022-09-24 DIAGNOSIS — Z1509 Genetic susceptibility to other malignant neoplasm: Secondary | ICD-10-CM | POA: Diagnosis not present

## 2022-09-24 DIAGNOSIS — K047 Periapical abscess without sinus: Secondary | ICD-10-CM | POA: Diagnosis not present

## 2022-09-24 DIAGNOSIS — Z1501 Genetic susceptibility to malignant neoplasm of breast: Secondary | ICD-10-CM | POA: Diagnosis not present

## 2022-09-24 DIAGNOSIS — G620 Drug-induced polyneuropathy: Secondary | ICD-10-CM

## 2022-09-24 DIAGNOSIS — F17210 Nicotine dependence, cigarettes, uncomplicated: Secondary | ICD-10-CM | POA: Insufficient documentation

## 2022-09-24 DIAGNOSIS — Z800 Family history of malignant neoplasm of digestive organs: Secondary | ICD-10-CM | POA: Diagnosis not present

## 2022-09-24 DIAGNOSIS — Z1589 Genetic susceptibility to other disease: Secondary | ICD-10-CM

## 2022-09-24 DIAGNOSIS — T451X5D Adverse effect of antineoplastic and immunosuppressive drugs, subsequent encounter: Secondary | ICD-10-CM | POA: Diagnosis not present

## 2022-09-24 DIAGNOSIS — Z9013 Acquired absence of bilateral breasts and nipples: Secondary | ICD-10-CM | POA: Diagnosis not present

## 2022-09-24 DIAGNOSIS — Z1502 Genetic susceptibility to malignant neoplasm of ovary: Secondary | ICD-10-CM

## 2022-09-24 LAB — COMPREHENSIVE METABOLIC PANEL
ALT: 18 U/L (ref 0–44)
AST: 23 U/L (ref 15–41)
Albumin: 4.6 g/dL (ref 3.5–5.0)
Alkaline Phosphatase: 70 U/L (ref 38–126)
Anion gap: 8 (ref 5–15)
BUN: 8 mg/dL (ref 6–20)
CO2: 28 mmol/L (ref 22–32)
Calcium: 9.5 mg/dL (ref 8.9–10.3)
Chloride: 97 mmol/L — ABNORMAL LOW (ref 98–111)
Creatinine, Ser: 0.67 mg/dL (ref 0.44–1.00)
GFR, Estimated: >60 mL/min (ref >60)
Glucose, Bld: 185 mg/dL — ABNORMAL HIGH (ref 70–99)
Potassium: 4.4 mmol/L (ref 3.5–5.1)
Sodium: 133 mmol/L — ABNORMAL LOW (ref 135–145)
Total Bilirubin: 0.7 mg/dL (ref 0.3–1.2)
Total Protein: 7.9 g/dL (ref 6.5–8.1)

## 2022-09-24 LAB — CBC WITH DIFFERENTIAL/PLATELET
Abs Immature Granulocytes: 0.05 10*3/uL (ref 0.00–0.07)
Basophils Absolute: 0.1 10*3/uL (ref 0.0–0.1)
Basophils Relative: 1 %
Eosinophils Absolute: 0.2 10*3/uL (ref 0.0–0.5)
Eosinophils Relative: 2 %
HCT: 45.5 % (ref 36.0–46.0)
Hemoglobin: 15.2 g/dL — ABNORMAL HIGH (ref 12.0–15.0)
Immature Granulocytes: 1 %
Lymphocytes Relative: 29 %
Lymphs Abs: 3 10*3/uL (ref 0.7–4.0)
MCH: 31.6 pg (ref 26.0–34.0)
MCHC: 33.4 g/dL (ref 30.0–36.0)
MCV: 94.6 fL (ref 80.0–100.0)
Monocytes Absolute: 0.6 10*3/uL (ref 0.1–1.0)
Monocytes Relative: 6 %
Neutro Abs: 6.5 10*3/uL (ref 1.7–7.7)
Neutrophils Relative %: 61 %
Platelets: 322 10*3/uL (ref 150–400)
RBC: 4.81 MIL/uL (ref 3.87–5.11)
RDW: 13.4 % (ref 11.5–15.5)
WBC: 10.4 10*3/uL (ref 4.0–10.5)
nRBC: 0 % (ref 0.0–0.2)

## 2022-09-24 MED ORDER — DULOXETINE HCL 20 MG PO CPEP: 20.0000 mg | ORAL_CAPSULE | ORAL | 0 refills | Status: DC

## 2022-09-24 MED ORDER — EXEMESTANE 25 MG PO TABS: 25.0000 mg | ORAL_TABLET | Freq: Every day | ORAL | 1 refills | Status: DC

## 2022-09-24 NOTE — Assessment & Plan Note (Addendum)
status post hysterectomy with bilateral salpingo-oophorectomy.  Annual dermatology evaluation. She has a family history of pancreatic cancer.  obtain annual MRI abdomen with and without contrast in December 2024-patient would like to defer for now.

## 2022-09-24 NOTE — Assessment & Plan Note (Addendum)
Stage IB pT2 pN0 M0 ER + PR+ HER2 negative right breast cancer, grade 3, +LVI,  Bilateral mastectomy and reconstruction [01/14/2019]  -BRCA2 positive Labs reviewed and discussed with patient. Arimidex --> letrozole--> Aromasin  Continue Aromasin.  Refills were sent to pharmacy.

## 2022-09-24 NOTE — Progress Notes (Signed)
Hematology/Oncology Progress note [phone] [phone]      Patient Care Team: Reubin Milan, MD as PCP - General (Internal Medicine) Jesusita Oka, MD (Dermatology) Jim Like, RN as Registered Nurse Carolan Shiver, MD as Consulting Physician (General Surgery) Rickard Patience, MD as Consulting Physician (Oncology) Dillingham, Alena Bills, DO as Attending Physician (Plastic Surgery)    ASSESSMENT & PLAN:   Cancer Staging  Malignant neoplasm of lower-outer quadrant of right breast of female, estrogen receptor positive (HCC) Staging form: Breast, AJCC 8th Edition - Clinical: Stage IIA (cT2, cN0, cM0, G3, ER+, PR+, HER2: Equivocal) - Signed by Rickard Patience, MD on 03/26/2022 - Pathologic stage from 01/21/2019: Stage IB (pT2, pN0, cM0, G3, ER+, PR+, HER2-) - Signed by Rickard Patience, MD on 01/22/2019   Malignant neoplasm of lower-outer quadrant of right breast of female, estrogen receptor positive (HCC) Stage IB pT2 pN0 M0 ER + PR+ HER2 negative right breast cancer, grade 3, +LVI,  Bilateral mastectomy and reconstruction [01/14/2019]  -BRCA2 positive Labs reviewed and discussed with patient. Arimidex --> letrozole--> Aromasin  Continue Aromasin.  Refills were sent to pharmacy.   BRCA2 gene mutation positive in female status post hysterectomy with bilateral salpingo-oophorectomy.  Annual dermatology evaluation. She has a family history of pancreatic cancer.  obtain annual MRI abdomen with and without contrast in December 2024-patient would like to defer for now.  Osteoporosis Zometa every 6 months-proceed with Zometa today..   Repeat DEXA showed improved, currently osteopenic T score -2.3 Hold off Zometa due to acute tooth infection.   Chemotherapy-induced neuropathy (HCC) Neuropathy has improved.  She would like to be weaned off Cymbalta. Recommend patient to decrease Cymbalta to 40 mg daily for 4 weeks, then decrease to 20 milligrams daily for 2  weeks and then stop.  New prescription was sent to pharmacy.    Orders Placed This Encounter  Procedures   CMP (Cancer Center only)    Standing Status:   Future    Standing Expiration Date:   09/24/2023   CBC with Differential (Cancer Center Only)    Standing Status:   Future    Standing Expiration Date:   09/24/2023   Cancer antigen 15-3    Standing Status:   Future    Standing Expiration Date:   09/24/2023   Cancer antigen 27.29    Standing Status:   Future    Standing Expiration Date:   09/24/2023   Follow up in 6 months.  All questions were answered. The patient knows to call the clinic with any problems, questions or concerns.  Rickard Patience, MD, PhD [facility] Hematology Oncology 09/24/2022     CHIEF COMPLAINTS/REASON FOR VISIT:  Follow-up for breast cancer.  HISTORY OF PRESENTING ILLNESS:  Cheryl Mooney is a  57 y.o.  female with PMH listed below who was referred to me for evaluation of breast cancer Patient reports feeling mass of her right breast week before her annual mammogram. 12/03/2018 patient had bilateral diagnostic mammogram and ultrasound  which showed suspicious 2.1 x 1.3 x 1.6 irregular mass at 7:00 in the right breast, 2 cm from the nipple. 2 borderline lymph nodes are seen in the right axilla.  1 of the nodes demonstrate a cortex of 4.4 mm.  Both lymph nodes demonstrated retention of fatty hila. Patient underwent ultrasound-guided biopsy of right breast mass as well as ultrasound-guided biopsy of 1 of the 2 mildly abnormal right axillary lymph nodes. Pathology showed invasive mammary carcinoma, no specific type, grade 3,  DCIS present, lymphovascular invasion present. Right axilla lymph node negative for malignancy. ER> 90% positive, PR11-50% positive, HER-2 equivocal, 2+ by IHC.  FISH negative.   Nipple discharge: Denies Family history: Mother diagnosed with breast cancer at age of 29, and colon cancer at age of 40.  Mother has BRCA2 mutation.   Maternal  grandmother breast cancer, maternal great aunt breast cancer.  Patient recalls that she was tested long time ago and was not aware of any results.  OCP use: In her 20s-30s Estrogen and progesterone therapy: denies History of radiation to chest: denies.  Previous breast surgery: Denies  BRCA 2 positive.  # 01/14/2019 patient underwent bilateral mastectomy with sentinel lymph node biopsy of left and the right axillary. Right breast showed invasive mammary carcinoma, DCIS positive, apocrine metaplastic, stromal fibrosis, duct ectasia, simple cyst formation, usual epithelial hyperplasia.  Sentinel lymph node on the right side was negative. pT2 pN0 Left prophylactic mastectomy and a sentinel lymph node negative for malignancy.  #OncotypeDX recurrence score 31, adjuvant chemotherapy benefit more than 15%. # She follows up with plastic surgeon Dr. Ulice Bold, she has expander, expanding process is being during chemotherapy. Mediport was placed by Dr. Maia Plan on 02/18/2019.  Patient had 1 cycle of AC, chemotherapy plan was switched to docetaxel and carboplatin after consulting expert opinion. She has finished 4 cycle of docetaxel and carboplatin. March 2021, started on Arimidex. May 2021, patient underwent laparoscopic-assisted vaginal hysterectomy with bilateralsalpingectomy and oophorectomy 10/13/19 bilateral breast reconstruction  Porta cath removal.  03/16/20 treatment for bilateral breast asymmetry treatment.  May 2023, stopped Arimidex and switched to letrozole due to fatigue.   INTERVAL HISTORY Cheryl Mooney is a 57 y.o. female who has above history reviewed by me today presents for follow up visit for management of stage I breast cancer, ER PR positive, HER-2 negative, hereditary BRCA 2 mutation.  Patient takes Aromasin, tolerates with manageable side effects. No other new concerns  Review of Systems  Constitutional:  Negative for appetite change, chills and fever.  HENT:   Negative  for hearing loss and voice change.   Eyes:  Negative for eye problems.  Respiratory:  Negative for chest tightness and cough.   Cardiovascular:  Negative for chest pain.  Gastrointestinal:  Negative for abdominal distention, abdominal pain, blood in stool and diarrhea.  Endocrine: Positive for hot flashes.  Genitourinary:  Negative for difficulty urinating and frequency.   Musculoskeletal:  Positive for arthralgias.  Skin:  Negative for itching and rash.  Neurological:  Negative for extremity weakness.  Hematological:  Negative for adenopathy.  Psychiatric/Behavioral:  Negative for confusion. The patient is not nervous/anxious.     MEDICAL HISTORY:  Past Medical History:  Diagnosis Date   Anemia    during chemo /things seem back to normal   Benign neoplasm of sigmoid colon    Benign neoplasm of transverse colon    Breast cancer (HCC)    Depression    Encounter for antineoplastic chemotherapy 12/30/2018   Pathogenic variant in BRCA2 called [id number] identified on the [facility] Breast Cancer STAT Panel. The STAT Breast cancer panel offered by [facility] includes sequencing and rearrangement analysis for the following 9 genes:  ATM, BRCA1, BRCA2, CDH1, CHEK2, PALB2, PTEN, STK11 and TP53.  The report date is 12/30/2018. Common Hereditary Cancer Panel results pending.    Family history of BRCA2 gene positive    Family history of breast cancer    Family history of colon cancer    Family history of lung cancer  Family history of pancreatic cancer    GERD (gastroesophageal reflux disease)    History of kidney stones    Hypertension    Malignant neoplasm of lower-outer quadrant of right breast of female, estrogen receptor positive (HCC) 12/17/2018   Nausea and vomiting 09/05/2022   Pre-diabetes    Seasonal allergies    Smoker     SURGICAL HISTORY: Past Surgical History:  Procedure Laterality Date   AXILLARY SENTINEL NODE BIOPSY Bilateral 01/14/2019   Procedure: AXILLARY SENTINEL  NODE BIOPSY;  Surgeon: Carolan Shiver, MD;  Location: [facility];  Service: General;  Laterality: Bilateral;   BREAST BIOPSY Right 12/08/2018   [place] bx of mass with calcs path pending   BREAST BIOPSY Right 12/08/2018   [place] bx of LN, path pending   BREAST RECONSTRUCTION WITH PLACEMENT OF TISSUE EXPANDER AND ALLODERM Bilateral 01/14/2019   Procedure: BILATERAL BREAST RECONSTRUCTION WITH PLACEMENT OF TISSUE EXPANDER AND FLEX HD;  Surgeon: Peggye Form, DO;  Location: [facility];  Service: Plastics;  Laterality: Bilateral;   COLONOSCOPY WITH PROPOFOL N/A 02/17/2015   Procedure: COLONOSCOPY WITH PROPOFOL;  Surgeon: Midge Minium, MD;  Location: [facility];  Service: Endoscopy;  Laterality: N/A;   COLONOSCOPY WITH PROPOFOL N/A 07/12/2019   Procedure: COLONOSCOPY WITH BIOPSY;  Surgeon: Midge Minium, MD;  Location: [facility];  Service: Endoscopy;  Laterality: N/A;  priority 3 PORT A CATH NEEDS ACCESSED   CYSTOSCOPY N/A 08/05/2019   Procedure: CYSTOSCOPY;  Surgeon: Natale Milch, MD;  Location: [facility];  Service: Gynecology;  Laterality: N/A;   ESOPHAGOGASTRODUODENOSCOPY (EGD) WITH PROPOFOL N/A 09/05/2022   Procedure: ESOPHAGOGASTRODUODENOSCOPY (EGD) WITH PROPOFOL polypectomy;  Surgeon: Midge Minium, MD;  Location: [facility];  Service: Endoscopy;  Laterality: N/A;   LIPOSUCTION WITH LIPOFILLING Bilateral 03/16/2020   Procedure: Bilateral Lipofilling of breasts for symmetry;  Surgeon: Peggye Form, DO;  Location: [facility];  Service: Plastics;  Laterality: Bilateral;  1.5 hours   POLYPECTOMY  02/17/2015   Procedure: POLYPECTOMY;  Surgeon: Midge Minium, MD;  Location: [facility];  Service: Endoscopy;;   POLYPECTOMY N/A 07/12/2019   Procedure: POLYPECTOMY;  Surgeon: Midge Minium, MD;  Location: [facility];  Service: Endoscopy;  Laterality: N/A;   PORTA CATH REMOVAL Left 10/13/2019   Procedure: PORTA CATH REMOVAL;  Surgeon:  Peggye Form, DO;  Location: [facility];  Service: Plastics;  Laterality: Left;   PORTACATH PLACEMENT Left 02/18/2019   Procedure: INSERTION PORT-A-CATH;  Surgeon: Carolan Shiver, MD;  Location: [facility];  Service: General;  Laterality: Left;   REMOVAL OF BILATERAL TISSUE EXPANDERS WITH PLACEMENT OF BILATERAL BREAST IMPLANTS Bilateral 10/13/2019   Procedure: REMOVAL OF BILATERAL TISSUE EXPANDERS WITH PLACEMENT OF BILATERAL BREAST IMPLANTS;  Surgeon: Peggye Form, DO;  Location: [facility];  Service: Plastics;  Laterality: Bilateral;  2 hours please   SIMPLE MASTECTOMY WITH AXILLARY SENTINEL NODE BIOPSY Left 01/14/2019   Procedure: LEFT SIMPLE MASTECTOMY;  Surgeon: Carolan Shiver, MD;  Location: [facility];  Service: General;  Laterality: Left;   TOTAL LAPAROSCOPIC HYSTERECTOMY WITH BILATERAL SALPINGO OOPHORECTOMY Bilateral 08/05/2019   Procedure: TOTAL LAPAROSCOPIC HYSTERECTOMY WITH BILATERAL SALPINGO OOPHORECTOMY;  Surgeon: Natale Milch, MD;  Location: [facility];  Service: Gynecology;  Laterality: Bilateral;   TOTAL MASTECTOMY Right 01/14/2019   Procedure: RIGHT TOTAL MASTECTOMY;  Surgeon: Carolan Shiver, MD;  Location: [facility];  Service: General;  Laterality: Right;   WISDOM TOOTH EXTRACTION      SOCIAL HISTORY:  Social History   Socioeconomic History   Marital status: Married    Spouse name: Jonny Ruiz   Number of children: Not on file   Years of education: Not on file   Highest education level: Not on file  Occupational History   Occupation: Investment banker, corporate    Comment: retired  Tobacco Use   Smoking status: Some Days    Packs/day: 0.25    Years: 20.00    Additional pack years: 0.00    Total pack years: 5.00    Types: Cigarettes   Smokeless tobacco: Never  Vaping Use   Vaping Use: Never used  Substance and Sexual Activity   Alcohol use: Yes    Alcohol/week: 2.0 standard drinks of alcohol    Types: 2 Standard  drinks or equivalent per week    Comment: occasional   Drug use: No   Sexual activity: Yes    Birth control/protection: Surgical  Other Topics Concern   Not on file  Social History Narrative   Patient lives with husband.   Social Determinants of Health   Financial Resource Strain: Low Risk  (09/14/2021)   Overall Financial Resource Strain (CARDIA)    Difficulty of Paying Living Expenses: Not hard at all  Food Insecurity: No Food Insecurity (09/14/2021)   Hunger Vital Sign    Worried About Running Out of Food in the Last Year: Never true    Ran Out of Food in the Last Year: Never true  Transportation Needs: No Transportation Needs (09/14/2021)   PRAPARE - Administrator, Civil Service (Medical): No    Lack of Transportation (Non-Medical): No  Physical Activity: Not on file  Stress: Not on file  Social Connections: Not on file  Intimate Partner Violence: Not At Risk (09/14/2021)   Humiliation, Afraid, Rape, and Kick questionnaire    Fear of Current or Ex-Partner: No    Emotionally Abused: No    Physically Abused: No    Sexually Abused: No  She is a retired Runner, broadcasting/film/video  FAMILY HISTORY: Family History  Problem Relation Age of Onset   Colon cancer Mother 41   Breast cancer Mother 87       "BRCA2+"   Hypertension Father    Stroke Father    Alzheimer's disease Father    Prostate cancer Father    Breast cancer Maternal Aunt        mat great aunt   Breast cancer Maternal Grandmother 59   Pancreatic cancer Maternal Grandfather 7    ALLERGIES:  has No Known Allergies.  MEDICATIONS:  Current Outpatient Medications  Medication Sig Dispense Refill   acetaminophen (TYLENOL) 500 MG tablet Take 2 tablets (1,000 mg total) by mouth every 6 (six) hours as needed for mild pain. 30 tablet 0   CALCIUM PO Take by mouth daily.     cetirizine (ZYRTEC) 10 MG tablet Take 10 mg by mouth daily.     Cholecalciferol (VITAMIN D3 PO) Take by mouth daily.     DULoxetine (CYMBALTA) 20 MG  capsule Take 1 capsule (20 mg total) by mouth See admin instructions. Take 40mg  daily for 2 weeks and then decrease to 20mg  daily for 2 weeks and then stop 42 capsule 0   lisinopril-hydrochlorothiazide (ZESTORETIC) 20-25 MG tablet Take 1 tablet by mouth daily. 90 tablet 3   multivitamin-iron-minerals-folic acid (CENTRUM) chewable tablet Chew 1 tablet by mouth daily.     exemestane (AROMASIN) 25 MG tablet Take 1 tablet (25 mg total) by mouth daily. 90 tablet  1   No current facility-administered medications for this visit.     PHYSICAL EXAMINATION: ECOG PERFORMANCE STATUS: 0 - Asymptomatic Vitals:   09/24/22 1458  BP: (!) 155/85  Pulse: 79  Resp: 18  Temp: 98.1 F (36.7 C)   Filed Weights   09/24/22 1458  Weight: 151 lb 9.6 oz (68.8 kg)    Physical Exam Constitutional:      General: She is not in acute distress. HENT:     Head: Normocephalic and atraumatic.  Eyes:     General: No scleral icterus.    Pupils: Pupils are equal, round, and reactive to light.  Cardiovascular:     Rate and Rhythm: Normal rate and regular rhythm.     Heart sounds: Normal heart sounds.  Pulmonary:     Effort: Pulmonary effort is normal. No respiratory distress.     Breath sounds: No wheezing.  Abdominal:     General: Bowel sounds are normal. There is no distension.     Palpations: Abdomen is soft. There is no mass.     Tenderness: There is no abdominal tenderness.  Musculoskeletal:        General: No deformity. Normal range of motion.     Cervical back: Normal range of motion and neck supple.  Skin:    General: Skin is warm and dry.     Findings: No erythema or rash.  Neurological:     Mental Status: She is alert and oriented to person, place, and time. Mental status is at baseline.     Cranial Nerves: No cranial nerve deficit.     Coordination: Coordination normal.  Psychiatric:        Mood and Affect: Mood normal.   Bilateral mastectomy with implant Breast exam is performed in seated  and lying down position. Patient is status post bilateral mastectomy with implants.   The edges are intact and there is no evidence of any chest wall recurrence.  No palpable bilateral axillary adenopathy  No focal bruising of right breast or right flank area.  LABORATORY DATA:  I have reviewed the data as listed Lab Results  Component Value Date   WBC 10.4 09/24/2022   HGB 15.2 (H) 09/24/2022   HCT 45.5 09/24/2022   MCV 94.6 09/24/2022   PLT 322 09/24/2022   Recent Labs    01/11/22 1056 03/25/22 1249 08/29/22 1555 09/24/22 1409  NA 140 136  --  133*  K 4.4 3.9 3.5 4.4  CL 99 101  --  97*  CO2 24 24  --  28  GLUCOSE 126* 146*  --  185*  BUN 8 9  --  8  CREATININE 0.72 0.60  --  0.67  CALCIUM 10.0 9.3  --  9.5  GFRNONAA  --  >60  --  >60  PROT 7.1 7.7  --  7.9  ALBUMIN 4.7 4.4  --  4.6  AST 13 25  --  23  ALT 15 18  --  18  ALKPHOS 93 83  --  70  BILITOT 0.3 0.5  --  0.7    Iron/TIBC/Ferritin/ %Sat No results found for: "IRON", "TIBC", "FERRITIN", "IRONPCTSAT"   RADIOGRAPHIC STUDIES: I have personally reviewed the radiological images as listed and agreed with the findings in the report. No results found.

## 2022-09-24 NOTE — Assessment & Plan Note (Addendum)
Zometa every 6 months-proceed with Zometa today..   Repeat DEXA showed improved, currently osteopenic T score -2.3 Hold off Zometa due to acute tooth infection.

## 2022-09-24 NOTE — Assessment & Plan Note (Signed)
Neuropathy has improved.  She would like to be weaned off Cymbalta. Recommend patient to decrease Cymbalta to 40 mg daily for 4 weeks, then decrease to 20 milligrams daily for 2 weeks and then stop.  New prescription was sent to pharmacy.

## 2022-09-24 NOTE — Progress Notes (Signed)
Pt here for follow up, reports that she need to have dental work and endodontist is recommending to hold off on today's zometa infusion. She will be starting on antibiotics.Pt would like to discuss with MD about stopping  duloxetine.

## 2022-09-27 ENCOUNTER — Other Ambulatory Visit: Payer: Self-pay | Admitting: Oncology

## 2022-10-01 ENCOUNTER — Encounter: Payer: Self-pay | Admitting: Oncology

## 2023-01-14 ENCOUNTER — Ambulatory Visit (INDEPENDENT_AMBULATORY_CARE_PROVIDER_SITE_OTHER): Payer: BC Managed Care – PPO | Admitting: Internal Medicine

## 2023-01-14 ENCOUNTER — Encounter: Payer: Self-pay | Admitting: Internal Medicine

## 2023-01-14 VITALS — BP 114/76 | HR 82 | Ht 64.0 in | Wt 151.0 lb

## 2023-01-14 DIAGNOSIS — Z0000 Encounter for general adult medical examination without abnormal findings: Secondary | ICD-10-CM

## 2023-01-14 DIAGNOSIS — E782 Mixed hyperlipidemia: Secondary | ICD-10-CM

## 2023-01-14 DIAGNOSIS — I10 Essential (primary) hypertension: Secondary | ICD-10-CM

## 2023-01-14 DIAGNOSIS — R1110 Vomiting, unspecified: Secondary | ICD-10-CM | POA: Insufficient documentation

## 2023-01-14 DIAGNOSIS — R7303 Prediabetes: Secondary | ICD-10-CM | POA: Diagnosis not present

## 2023-01-14 DIAGNOSIS — F17200 Nicotine dependence, unspecified, uncomplicated: Secondary | ICD-10-CM

## 2023-01-14 MED ORDER — LISINOPRIL-HYDROCHLOROTHIAZIDE 20-25 MG PO TABS: 1.0000 | ORAL_TABLET | Freq: Every day | ORAL | 3 refills | Status: DC

## 2023-01-14 NOTE — Assessment & Plan Note (Addendum)
Does not qualify for LDCT screening.

## 2023-01-14 NOTE — Patient Instructions (Signed)
Pepcid 10 mg once a day  Continue to take TUMS as needed

## 2023-01-14 NOTE — Progress Notes (Signed)
Date:  01/14/2023   Name:  Cheryl Mooney   DOB:  Aug 04, 1965   MRN:  [id number]   Chief Complaint: Annual Exam Cheryl Mooney is a 57 y.o. female who presents today for her Complete Annual Exam. She feels well. She reports exercising- walking. She reports she is sleeping fairly well. Breast complaints - none.  Mammogram: discontinued DEXA: 03/2022 osteopenia Colonoscopy: 07/2019 repeat 5 yrs  Health Maintenance Due  Topic Date Due   HIV Screening  Never done   COVID-19 Vaccine (3 - Moderna risk series) 08/22/2019   DTaP/Tdap/Td (2 - Td or Tdap) 09/19/2021    Immunization History  Administered Date(s) Administered   Influenza,inj,Quad PF,6+ Mos 12/20/2014, 05/31/2020, 01/09/2021   Influenza,inj,quad, With Preservative 02/09/2019   Influenza-Unspecified 02/09/2019   Moderna Sars-Covid-2 Vaccination 06/26/2019, 07/25/2019   PNEUMOCOCCAL CONJUGATE-20 07/10/2021   PPD Test 11/18/2016   Tdap 09/20/2011   Zoster Recombinant(Shingrix) 02/22/2019, 08/23/2019     Hypertension This is a chronic problem. The problem is controlled. Pertinent negatives include no anxiety, chest pain, headaches, palpitations, peripheral edema or shortness of breath. Past treatments include ACE inhibitors and diuretics. The current treatment provides significant improvement. There is no history of kidney disease, CAD/MI or CVA.  Diabetes She presents for her follow-up diabetic visit. Diabetes type: prediabetes. Pertinent negatives for hypoglycemia include no headaches. Pertinent negatives for diabetes include no chest pain, no foot paresthesias, no foot ulcerations, no polydipsia, no visual change and no weight loss. Symptoms are stable. Pertinent negatives for diabetic complications include no CVA.  Regurgitation of food - this occurs about three times per week.  She has not needed to go to the ED.  EGD done recently showed mild gastritis and esophagitis but no stricture.  Biopsies were benign.  Review of  Systems  Constitutional:  Negative for weight loss.  HENT:  Positive for trouble swallowing.   Respiratory:  Negative for shortness of breath.   Cardiovascular:  Negative for chest pain and palpitations.  Endocrine: Negative for polydipsia.  Neurological:  Negative for headaches.     Lab Results  Component Value Date   NA 133 (L) 09/24/2022   K 4.4 09/24/2022   CO2 28 09/24/2022   GLUCOSE 185 (H) 09/24/2022   BUN 8 09/24/2022   CREATININE 0.67 09/24/2022   CALCIUM 9.5 09/24/2022   EGFR 97 01/11/2022   GFRNONAA >60 09/24/2022   Lab Results  Component Value Date   CHOL 233 (H) 01/11/2022   HDL 51 01/11/2022   LDLCALC 153 (H) 01/11/2022   TRIG 160 (H) 01/11/2022   CHOLHDL 4.6 (H) 01/11/2022   Lab Results  Component Value Date   TSH 1.920 01/11/2022   Lab Results  Component Value Date   HGBA1C 6.4 (A) 05/20/2022   Lab Results  Component Value Date   WBC 10.4 09/24/2022   HGB 15.2 (H) 09/24/2022   HCT 45.5 09/24/2022   MCV 94.6 09/24/2022   PLT 322 09/24/2022   Lab Results  Component Value Date   ALT 18 09/24/2022   AST 23 09/24/2022   ALKPHOS 70 09/24/2022   BILITOT 0.7 09/24/2022   Lab Results  Component Value Date   VD25OH 35.4 07/10/2021     Patient Active Problem List   Diagnosis Date Noted   Regurgitation of food 01/14/2023   Chemotherapy-induced neuropathy (HCC) 09/24/2022   Nausea and vomiting 09/05/2022   Gastroesophageal reflux disease 01/11/2022   S/P total hysterectomy and bilateral salpingo-oophorectomy 01/09/2021   Osteoporosis 01/09/2021  Breast asymmetry following reconstructive surgery 02/22/2020   Vitamin D deficiency 11/26/2019   Prediabetes 11/26/2019   S/P breast reconstruction, bilateral 11/07/2019   Carrier of high risk cancer gene mutation    History of colonic polyps    Polyp of sigmoid colon    Acquired absence of bilateral breasts and nipples 07/06/2019   Goals of care, counseling/discussion 03/26/2019   Hyponatremia  03/08/2019   Constipation 03/08/2019   Chest wall pain following surgery 03/08/2019   BRCA2 gene mutation positive in female 12/30/2018   Family history of pancreatic cancer    Family history of lung cancer    Family history of BRCA2 gene positive    Family history of colon cancer    Malignant neoplasm of lower-outer quadrant of right breast of female, estrogen receptor positive (HCC) 12/17/2018   Hyperlipidemia, mixed 11/19/2016   Tubular adenoma of colon 02/21/2015   Tobacco use disorder 11/27/2014   Essential hypertension 11/27/2014   Hx of abnormal cervical Pap smear 09/07/2011    No Known Allergies  Past Surgical History:  Procedure Laterality Date   AXILLARY SENTINEL NODE BIOPSY Bilateral 01/14/2019   Procedure: AXILLARY SENTINEL NODE BIOPSY;  Surgeon: Carolan Shiver, MD;  Location: [facility];  Service: General;  Laterality: Bilateral;   BREAST BIOPSY Right 12/08/2018   [place] bx of mass with calcs path pending   BREAST BIOPSY Right 12/08/2018   [place] bx of LN, path pending   BREAST RECONSTRUCTION WITH PLACEMENT OF TISSUE EXPANDER AND ALLODERM Bilateral 01/14/2019   Procedure: BILATERAL BREAST RECONSTRUCTION WITH PLACEMENT OF TISSUE EXPANDER AND FLEX HD;  Surgeon: Peggye Form, DO;  Location: [facility];  Service: Plastics;  Laterality: Bilateral;   COLONOSCOPY WITH PROPOFOL N/A 02/17/2015   Procedure: COLONOSCOPY WITH PROPOFOL;  Surgeon: Midge Minium, MD;  Location: [facility];  Service: Endoscopy;  Laterality: N/A;   COLONOSCOPY WITH PROPOFOL N/A 07/12/2019   Procedure: COLONOSCOPY WITH BIOPSY;  Surgeon: Midge Minium, MD;  Location: [facility];  Service: Endoscopy;  Laterality: N/A;  priority 3 PORT A CATH NEEDS ACCESSED   CYSTOSCOPY N/A 08/05/2019   Procedure: CYSTOSCOPY;  Surgeon: Natale Milch, MD;  Location: [facility];  Service: Gynecology;  Laterality: N/A;   ESOPHAGOGASTRODUODENOSCOPY (EGD) WITH PROPOFOL N/A 09/05/2022   Procedure:  ESOPHAGOGASTRODUODENOSCOPY (EGD) WITH PROPOFOL polypectomy;  Surgeon: Midge Minium, MD;  Location: [facility];  Service: Endoscopy;  Laterality: N/A;   LIPOSUCTION WITH LIPOFILLING Bilateral 03/16/2020   Procedure: Bilateral Lipofilling of breasts for symmetry;  Surgeon: Peggye Form, DO;  Location: [facility];  Service: Plastics;  Laterality: Bilateral;  1.5 hours   POLYPECTOMY  02/17/2015   Procedure: POLYPECTOMY;  Surgeon: Midge Minium, MD;  Location: [facility];  Service: Endoscopy;;   POLYPECTOMY N/A 07/12/2019   Procedure: POLYPECTOMY;  Surgeon: Midge Minium, MD;  Location: [facility];  Service: Endoscopy;  Laterality: N/A;   PORTA CATH REMOVAL Left 10/13/2019   Procedure: PORTA CATH REMOVAL;  Surgeon: Peggye Form, DO;  Location: [facility];  Service: Plastics;  Laterality: Left;   PORTACATH PLACEMENT Left 02/18/2019   Procedure: INSERTION PORT-A-CATH;  Surgeon: Carolan Shiver, MD;  Location: [facility];  Service: General;  Laterality: Left;   REMOVAL OF BILATERAL TISSUE EXPANDERS WITH PLACEMENT OF BILATERAL BREAST IMPLANTS Bilateral 10/13/2019   Procedure: REMOVAL OF BILATERAL TISSUE EXPANDERS WITH PLACEMENT OF BILATERAL BREAST IMPLANTS;  Surgeon: Peggye Form, DO;  Location: [facility];  Service: Plastics;  Laterality:  Bilateral;  2 hours please   SIMPLE MASTECTOMY WITH AXILLARY SENTINEL NODE BIOPSY Left 01/14/2019   Procedure: LEFT SIMPLE MASTECTOMY;  Surgeon: Carolan Shiver, MD;  Location: [facility];  Service: General;  Laterality: Left;   TOTAL LAPAROSCOPIC HYSTERECTOMY WITH BILATERAL SALPINGO OOPHORECTOMY Bilateral 08/05/2019   Procedure: TOTAL LAPAROSCOPIC HYSTERECTOMY WITH BILATERAL SALPINGO OOPHORECTOMY;  Surgeon: Natale Milch, MD;  Location: [facility];  Service: Gynecology;  Laterality: Bilateral;   TOTAL MASTECTOMY Right 01/14/2019   Procedure: RIGHT TOTAL MASTECTOMY;   Surgeon: Carolan Shiver, MD;  Location: [facility];  Service: General;  Laterality: Right;   WISDOM TOOTH EXTRACTION      Social History   Tobacco Use   Smoking status: Every Day    Current packs/day: 0.25    Average packs/day: 0.3 packs/day for 34.8 years (8.7 ttl pk-yrs)    Types: Cigarettes    Start date: 23   Smokeless tobacco: Never  Vaping Use   Vaping status: Never Used  Substance Use Topics   Alcohol use: Yes    Alcohol/week: 2.0 standard drinks of alcohol    Types: 2 Standard drinks or equivalent per week    Comment: occasional   Drug use: No     Medication list has been reviewed and updated.  Current Meds  Medication Sig   acetaminophen (TYLENOL) 500 MG tablet Take 2 tablets (1,000 mg total) by mouth every 6 (six) hours as needed for mild pain.   CALCIUM PO Take by mouth daily.   cetirizine (ZYRTEC) 10 MG tablet Take 10 mg by mouth daily.   Cholecalciferol (VITAMIN D3 PO) Take by mouth daily.   exemestane (AROMASIN) 25 MG tablet Take 1 tablet (25 mg total) by mouth daily.   multivitamin-iron-minerals-folic acid (CENTRUM) chewable tablet Chew 1 tablet by mouth daily.   [DISCONTINUED] DULoxetine (CYMBALTA) 20 MG capsule Take 1 capsule (20 mg total) by mouth See admin instructions. Take 40mg  daily for 2 weeks and then decrease to 20mg  daily for 2 weeks and then stop   [DISCONTINUED] lisinopril-hydrochlorothiazide (ZESTORETIC) 20-25 MG tablet Take 1 tablet by mouth daily.       01/14/2023   10:10 AM 05/20/2022   10:10 AM 01/11/2022   10:11 AM 09/14/2021    2:07 PM  GAD 7 : Generalized Anxiety Score  Nervous, Anxious, on Edge 1 0 0 0  Control/stop worrying 1 0 0 0  Worry too much - different things 1 0 0 0  Trouble relaxing 0 0 0 0  Restless 0 0 0 0  Easily annoyed or irritable 0 0 1 0  Afraid - awful might happen 0 0 0 0  Total GAD 7 Score 3 0 1 0  Anxiety Difficulty Not difficult at all Not difficult at all Not difficult at all        01/14/2023    10:09 AM 05/20/2022   10:10 AM 01/11/2022   10:10 AM  Depression screen PHQ 2/9  Decreased Interest 0 0 1  Down, Depressed, Hopeless 1 0 0  PHQ - 2 Score 1 0 1  Altered sleeping 1 0 2  Tired, decreased energy 3 0 2  Change in appetite 0 0 0  Feeling bad or failure about yourself  0 0 0  Trouble concentrating 0 0 0  Moving slowly or fidgety/restless 0 0 0  Suicidal thoughts 0 0 0  PHQ-9 Score 5 0 5  Difficult doing work/chores Not difficult at all Not difficult at all Not difficult at all  BP Readings from Last 3 Encounters:  01/14/23 114/76  09/24/22 (!) 155/85  09/05/22 (!) 140/85    Physical Exam Vitals and nursing note reviewed.  Constitutional:      General: She is not in acute distress.    Appearance: She is well-developed.  HENT:     Head: Normocephalic and atraumatic.     Right Ear: Tympanic membrane and ear canal normal.     Left Ear: Tympanic membrane and ear canal normal.     Nose:     Right Sinus: No maxillary sinus tenderness.     Left Sinus: No maxillary sinus tenderness.  Eyes:     General: No scleral icterus.       Right eye: No discharge.        Left eye: No discharge.     Conjunctiva/sclera: Conjunctivae normal.  Neck:     Thyroid: No thyromegaly.     Vascular: No carotid bruit.  Cardiovascular:     Rate and Rhythm: Normal rate and regular rhythm.     Pulses: Normal pulses.     Heart sounds: Normal heart sounds.  Pulmonary:     Effort: Pulmonary effort is normal. No respiratory distress.     Breath sounds: No wheezing.  Chest:  Breasts:    Right: Absent. No mass or skin change.     Left: Absent. No mass or skin change.     Comments: Breast reconstruction after total mastectomy Abdominal:     General: Bowel sounds are normal.     Palpations: Abdomen is soft.     Tenderness: There is no abdominal tenderness.  Musculoskeletal:        General: Normal range of motion.     Cervical back: Normal range of motion. No erythema.     Right lower  leg: No edema.     Left lower leg: No edema.  Lymphadenopathy:     Cervical: No cervical adenopathy.  Skin:    General: Skin is warm and dry.     Capillary Refill: Capillary refill takes less than 2 seconds.     Findings: No rash.  Neurological:     Mental Status: She is alert and oriented to person, place, and time.     Cranial Nerves: No cranial nerve deficit.     Sensory: No sensory deficit.     Deep Tendon Reflexes: Reflexes are normal and symmetric.  Psychiatric:        Attention and Perception: Attention normal.        Mood and Affect: Mood normal.     Wt Readings from Last 3 Encounters:  01/14/23 151 lb (68.5 kg)  09/24/22 151 lb 9.6 oz (68.8 kg)  09/05/22 149 lb (67.6 kg)    BP 114/76   Pulse 82   Ht 5\' 4"  (1.626 m)   Wt 151 lb (68.5 kg)   SpO2 95%   BMI 25.92 kg/m   Assessment and Plan:  Problem List Items Addressed This Visit       Unprioritized   Essential hypertension (Chronic)    Normal exam with stable BP on lisinopril hct. No concerns or side effects to current medication. No change in regimen; continue low sodium diet.       Relevant Medications   lisinopril-hydrochlorothiazide (ZESTORETIC) 20-25 MG tablet   Other Relevant Orders   CBC with Differential/Platelet   Comprehensive metabolic panel   TSH   Urinalysis, Routine w reflex microscopic   Hyperlipidemia, mixed (Chronic)    Lipids managed  with diet and life style changes.      Relevant Medications   lisinopril-hydrochlorothiazide (ZESTORETIC) 20-25 MG tablet   Other Relevant Orders   Lipid panel   Prediabetes (Chronic)    On low carb diet with exercise Lab Results  Component Value Date   HGBA1C 6.4 (A) 05/20/2022         Relevant Orders   Hemoglobin A1c   Regurgitation of food    Recommend low dose Pepcid daily for gastric and esophageal inflammation. Will ask GI for further recommendations to address the recurrent symptoms of dysmotility.      Tobacco use disorder     Does not qualify for LDCT screening.      Other Visit Diagnoses     Annual physical exam    -  Primary   Relevant Orders   CBC with Differential/Platelet   Comprehensive metabolic panel   Hemoglobin A1c   Lipid panel   TSH   Urinalysis, Routine w reflex microscopic       Return in about 6 months (around 07/15/2023) for HTN,pre DM.    Reubin Milan, MD [facility] [place]

## 2023-01-14 NOTE — Assessment & Plan Note (Signed)
Lipids managed with diet and lifestyle changes

## 2023-01-14 NOTE — Assessment & Plan Note (Signed)
Normal exam with stable BP on lisinopril hct. No concerns or side effects to current medication. No change in regimen; continue low sodium diet.

## 2023-01-14 NOTE — Assessment & Plan Note (Addendum)
Recommend low dose Pepcid daily for gastric and esophageal inflammation. Will ask GI for further recommendations to address the recurrent symptoms of dysmotility.

## 2023-01-14 NOTE — Assessment & Plan Note (Signed)
On low carb diet with exercise Lab Results  Component Value Date   HGBA1C 6.4 (A) 05/20/2022

## 2023-01-15 LAB — CBC WITH DIFFERENTIAL/PLATELET
Basophils Absolute: 0.1 10*3/uL (ref 0.0–0.2)
Basos: 1 %
EOS (ABSOLUTE): 0.3 10*3/uL (ref 0.0–0.4)
Eos: 3 %
Hematocrit: 49.3 % — ABNORMAL HIGH (ref 34.0–46.6)
Hemoglobin: 16.6 g/dL — ABNORMAL HIGH (ref 11.1–15.9)
Immature Grans (Abs): 0 10*3/uL (ref 0.0–0.1)
Immature Granulocytes: 0 %
Lymphocytes Absolute: 3.3 10*3/uL — ABNORMAL HIGH (ref 0.7–3.1)
Lymphs: 37 %
MCH: 31.9 pg (ref 26.6–33.0)
MCHC: 33.7 g/dL (ref 31.5–35.7)
MCV: 95 fL (ref 79–97)
Monocytes Absolute: 0.6 10*3/uL (ref 0.1–0.9)
Monocytes: 7 %
Neutrophils Absolute: 4.7 10*3/uL (ref 1.4–7.0)
Neutrophils: 52 %
Platelets: 336 10*3/uL (ref 150–450)
RBC: 5.21 x10E6/uL (ref 3.77–5.28)
RDW: 11.6 % — ABNORMAL LOW (ref 11.7–15.4)
WBC: 9 10*3/uL (ref 3.4–10.8)

## 2023-01-15 LAB — URINALYSIS, ROUTINE W REFLEX MICROSCOPIC
Bilirubin, UA: NEGATIVE
Glucose, UA: NEGATIVE
Ketones, UA: NEGATIVE
Leukocytes,UA: NEGATIVE
Nitrite, UA: NEGATIVE
Protein,UA: NEGATIVE
RBC, UA: NEGATIVE
Specific Gravity, UA: 1.022 (ref 1.005–1.030)
Urobilinogen, Ur: 0.2 mg/dL (ref 0.2–1.0)
pH, UA: 5.5 (ref 5.0–7.5)

## 2023-01-15 LAB — COMPREHENSIVE METABOLIC PANEL
ALT: 14 [IU]/L (ref 0–32)
AST: 19 [IU]/L (ref 0–40)
Albumin: 4.8 g/dL (ref 3.8–4.9)
Alkaline Phosphatase: 113 [IU]/L (ref 44–121)
BUN/Creatinine Ratio: 21 (ref 9–23)
BUN: 16 mg/dL (ref 6–24)
Bilirubin Total: 0.2 mg/dL (ref 0.0–1.2)
CO2: 21 mmol/L (ref 20–29)
Calcium: 10.6 mg/dL — ABNORMAL HIGH (ref 8.7–10.2)
Chloride: 99 mmol/L (ref 96–106)
Creatinine, Ser: 0.76 mg/dL (ref 0.57–1.00)
Globulin, Total: 3.1 g/dL (ref 1.5–4.5)
Glucose: 128 mg/dL — ABNORMAL HIGH (ref 70–99)
Potassium: 5 mmol/L (ref 3.5–5.2)
Sodium: 137 mmol/L (ref 134–144)
Total Protein: 7.9 g/dL (ref 6.0–8.5)
eGFR: 90 mL/min/{1.73_m2} (ref >59)

## 2023-01-15 LAB — LIPID PANEL
Chol/HDL Ratio: 4.9 {ratio} — ABNORMAL HIGH (ref 0.0–4.4)
Cholesterol, Total: 267 mg/dL — ABNORMAL HIGH (ref 100–199)
HDL: 55 mg/dL (ref >39)
LDL Chol Calc (NIH): 188 mg/dL — ABNORMAL HIGH (ref 0–99)
Triglycerides: 134 mg/dL (ref 0–149)
VLDL Cholesterol Cal: 24 mg/dL (ref 5–40)

## 2023-01-15 LAB — TSH: TSH: 1.39 u[IU]/mL (ref 0.450–4.500)

## 2023-01-15 LAB — HEMOGLOBIN A1C
Est. average glucose Bld gHb Est-mCnc: 140 mg/dL
Hgb A1c MFr Bld: 6.5 % — ABNORMAL HIGH (ref 4.8–5.6)

## 2023-03-26 ENCOUNTER — Encounter: Payer: Self-pay | Admitting: Oncology

## 2023-03-26 ENCOUNTER — Inpatient Hospital Stay: Payer: BC Managed Care – PPO

## 2023-03-26 ENCOUNTER — Inpatient Hospital Stay (HOSPITAL_BASED_OUTPATIENT_CLINIC_OR_DEPARTMENT_OTHER): Payer: BC Managed Care – PPO | Admitting: Oncology

## 2023-03-26 ENCOUNTER — Inpatient Hospital Stay: Payer: BC Managed Care – PPO | Attending: Oncology

## 2023-03-26 VITALS — BP 141/89 | HR 69 | Temp 97.2°F | Resp 18 | Wt 150.5 lb

## 2023-03-26 DIAGNOSIS — G620 Drug-induced polyneuropathy: Secondary | ICD-10-CM

## 2023-03-26 DIAGNOSIS — M810 Age-related osteoporosis without current pathological fracture: Secondary | ICD-10-CM

## 2023-03-26 DIAGNOSIS — T451X5D Adverse effect of antineoplastic and immunosuppressive drugs, subsequent encounter: Secondary | ICD-10-CM | POA: Diagnosis not present

## 2023-03-26 DIAGNOSIS — Z1502 Genetic susceptibility to malignant neoplasm of ovary: Secondary | ICD-10-CM

## 2023-03-26 DIAGNOSIS — Z9013 Acquired absence of bilateral breasts and nipples: Secondary | ICD-10-CM | POA: Diagnosis not present

## 2023-03-26 DIAGNOSIS — Z170 Estrogen receptor positive status [ER+]: Secondary | ICD-10-CM | POA: Insufficient documentation

## 2023-03-26 DIAGNOSIS — Z1509 Genetic susceptibility to other malignant neoplasm: Secondary | ICD-10-CM | POA: Insufficient documentation

## 2023-03-26 DIAGNOSIS — M8580 Other specified disorders of bone density and structure, unspecified site: Secondary | ICD-10-CM | POA: Diagnosis present

## 2023-03-26 DIAGNOSIS — Z79811 Long term (current) use of aromatase inhibitors: Secondary | ICD-10-CM | POA: Insufficient documentation

## 2023-03-26 DIAGNOSIS — Z90710 Acquired absence of both cervix and uterus: Secondary | ICD-10-CM | POA: Diagnosis not present

## 2023-03-26 DIAGNOSIS — C50511 Malignant neoplasm of lower-outer quadrant of right female breast: Secondary | ICD-10-CM | POA: Insufficient documentation

## 2023-03-26 DIAGNOSIS — F17210 Nicotine dependence, cigarettes, uncomplicated: Secondary | ICD-10-CM | POA: Insufficient documentation

## 2023-03-26 DIAGNOSIS — T451X5A Adverse effect of antineoplastic and immunosuppressive drugs, initial encounter: Secondary | ICD-10-CM

## 2023-03-26 DIAGNOSIS — Z803 Family history of malignant neoplasm of breast: Secondary | ICD-10-CM | POA: Diagnosis not present

## 2023-03-26 DIAGNOSIS — Z800 Family history of malignant neoplasm of digestive organs: Secondary | ICD-10-CM | POA: Diagnosis not present

## 2023-03-26 DIAGNOSIS — Z1501 Genetic susceptibility to malignant neoplasm of breast: Secondary | ICD-10-CM | POA: Insufficient documentation

## 2023-03-26 DIAGNOSIS — Z79899 Other long term (current) drug therapy: Secondary | ICD-10-CM | POA: Insufficient documentation

## 2023-03-26 LAB — CMP (CANCER CENTER ONLY)
ALT: 15 U/L (ref 0–44)
AST: 23 U/L (ref 15–41)
Albumin: 4.2 g/dL (ref 3.5–5.0)
Alkaline Phosphatase: 70 U/L (ref 38–126)
Anion gap: 11 (ref 5–15)
BUN: 7 mg/dL (ref 6–20)
CO2: 23 mmol/L (ref 22–32)
Calcium: 9.4 mg/dL (ref 8.9–10.3)
Chloride: 99 mmol/L (ref 98–111)
Creatinine: 0.73 mg/dL (ref 0.44–1.00)
GFR, Estimated: >60 mL/min (ref >60)
Glucose, Bld: 173 mg/dL — ABNORMAL HIGH (ref 70–99)
Potassium: 3.9 mmol/L (ref 3.5–5.1)
Sodium: 133 mmol/L — ABNORMAL LOW (ref 135–145)
Total Bilirubin: 0.5 mg/dL (ref <1.2)
Total Protein: 7.6 g/dL (ref 6.5–8.1)

## 2023-03-26 LAB — CBC WITH DIFFERENTIAL (CANCER CENTER ONLY)
Abs Immature Granulocytes: 0.03 10*3/uL (ref 0.00–0.07)
Basophils Absolute: 0.1 10*3/uL (ref 0.0–0.1)
Basophils Relative: 1 %
Eosinophils Absolute: 0.2 10*3/uL (ref 0.0–0.5)
Eosinophils Relative: 2 %
HCT: 45.8 % (ref 36.0–46.0)
Hemoglobin: 15.3 g/dL — ABNORMAL HIGH (ref 12.0–15.0)
Immature Granulocytes: 0 %
Lymphocytes Relative: 35 %
Lymphs Abs: 2.9 10*3/uL (ref 0.7–4.0)
MCH: 31.7 pg (ref 26.0–34.0)
MCHC: 33.4 g/dL (ref 30.0–36.0)
MCV: 94.8 fL (ref 80.0–100.0)
Monocytes Absolute: 0.5 10*3/uL (ref 0.1–1.0)
Monocytes Relative: 6 %
Neutro Abs: 4.7 10*3/uL (ref 1.7–7.7)
Neutrophils Relative %: 56 %
Platelet Count: 305 10*3/uL (ref 150–400)
RBC: 4.83 MIL/uL (ref 3.87–5.11)
RDW: 13 % (ref 11.5–15.5)
WBC Count: 8.4 10*3/uL (ref 4.0–10.5)
nRBC: 0 % (ref 0.0–0.2)

## 2023-03-26 MED ORDER — SODIUM CHLORIDE 0.9 % IV SOLN
Freq: Once | INTRAVENOUS | Status: AC
  Filled 2023-03-26: qty 250

## 2023-03-26 MED ORDER — ZOLEDRONIC ACID 4 MG/100ML IV SOLN
4.0000 mg | Freq: Once | INTRAVENOUS | Status: AC
  Administered 2023-03-26: 4 mg via INTRAVENOUS
  Filled 2023-03-26: qty 100

## 2023-03-26 NOTE — Patient Instructions (Signed)

## 2023-03-26 NOTE — Assessment & Plan Note (Signed)
Zometa every 6 months-proceed with Zometa today..   Repeat DEXA showed improved, currently osteopenic T score -2.3 Proceed with Zometa

## 2023-03-26 NOTE — Assessment & Plan Note (Addendum)
Neuropathy has improved, minimal symptoms,  She has weaned off Cymbalta.

## 2023-03-26 NOTE — Progress Notes (Signed)
Hematology/Oncology Progress note [phone] [phone]      Patient Care Team: Reubin Milan, MD as PCP - General (Internal Medicine) Jesusita Oka, MD (Dermatology) Jim Like, RN as Registered Nurse Carolan Shiver, MD as Consulting Physician (General Surgery) Rickard Patience, MD as Consulting Physician (Oncology) Dillingham, Alena Bills, DO as Attending Physician (Plastic Surgery)    ASSESSMENT & PLAN:   Cancer Staging  Malignant neoplasm of lower-outer quadrant of right breast of female, estrogen receptor positive (HCC) Staging form: Breast, AJCC 8th Edition - Clinical: Stage IIA (cT2, cN0, cM0, G3, ER+, PR+, HER2: Equivocal) - Signed by Rickard Patience, MD on 03/26/2022 - Pathologic stage from 01/21/2019: Stage IB (pT2, pN0, cM0, G3, ER+, PR+, HER2-) - Signed by Rickard Patience, MD on 01/22/2019   Malignant neoplasm of lower-outer quadrant of right breast of female, estrogen receptor positive (HCC) Stage IB pT2 pN0 M0 ER + PR+ HER2 negative right breast cancer, grade 3, +LVI,  Bilateral mastectomy and reconstruction [01/14/2019]  -BRCA2 positive Labs reviewed and discussed with patient. Arimidex --> letrozole--> Aromasin, plan total 5 years of endocrine therapy, until March 2026 Continue Aromasin.  Refills were sent to pharmacy.  BRCA2 gene mutation positive in female status post hysterectomy with bilateral salpingo-oophorectomy.  Annual dermatology evaluation. She has a family history of pancreatic cancer.  obtain annual MRI abdomen with and without contrast -patient would like to defer for now.  Chemotherapy-induced neuropathy (HCC) Neuropathy has improved, minimal symptoms,  She has weaned off Cymbalta.   Osteopenia Zometa every 6 months-proceed with Zometa today..   Repeat DEXA showed improved, currently osteopenic T score -2.3 Proceed with Zometa     Orders Placed This Encounter  Procedures   CBC with Differential (Cancer Center  Only)    Standing Status:   Future    Expected Date:   09/24/2023    Expiration Date:   03/25/2024   CMP (Cancer Center only)    Standing Status:   Future    Expected Date:   09/24/2023    Expiration Date:   03/25/2024   Follow up in 6 months.  All questions were answered. The patient knows to call the clinic with any problems, questions or concerns.  Rickard Patience, MD, PhD [facility] Hematology Oncology 03/26/2023     CHIEF COMPLAINTS/REASON FOR VISIT:  Follow-up for breast cancer.  HISTORY OF PRESENTING ILLNESS:  Cheryl Mooney is a  57 y.o.  female with PMH listed below who was referred to me for evaluation of breast cancer Patient reports feeling mass of her right breast week before her annual mammogram. 12/03/2018 patient had bilateral diagnostic mammogram and ultrasound  which showed suspicious 2.1 x 1.3 x 1.6 irregular mass at 7:00 in the right breast, 2 cm from the nipple. 2 borderline lymph nodes are seen in the right axilla.  1 of the nodes demonstrate a cortex of 4.4 mm.  Both lymph nodes demonstrated retention of fatty hila. Patient underwent ultrasound-guided biopsy of right breast mass as well as ultrasound-guided biopsy of 1 of the 2 mildly abnormal right axillary lymph nodes. 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 Right axilla lymph node negative for malignancy. ER> 90% positive, PR11-50% positive, HER-2 equivocal, 2+ by IHC.  FISH negative.   Nipple discharge: Denies Family history: Mother diagnosed with breast cancer at age of 67, and colon cancer at age of 40.  Mother has BRCA2 mutation.   Maternal grandmother breast cancer, maternal  great aunt breast cancer.  Patient recalls that she was tested long time ago and was not aware of any results.  OCP use: In her 20s-30s Estrogen and progesterone therapy: denies History of radiation to chest: denies.  Previous breast surgery: Denies  BRCA  2 positive.  # 01/14/2019 patient underwent bilateral mastectomy with sentinel lymph node biopsy of left and the right axillary. Right breast showed invasive mammary carcinoma, DCIS positive, apocrine metaplastic, stromal fibrosis, duct ectasia, simple cyst formation, usual epithelial hyperplasia.  Sentinel lymph node on the right side was negative. pT2 pN0 Left prophylactic mastectomy and a sentinel lymph node negative for malignancy.  #OncotypeDX recurrence score 31, adjuvant chemotherapy benefit more than 15%. # She follows up with plastic surgeon Dr. Ulice Bold, she has expander, expanding process is being during chemotherapy. Mediport was placed by Dr. Maia Plan on 02/18/2019.  Patient had 1 cycle of AC, chemotherapy plan was switched to docetaxel and carboplatin after consulting expert opinion. She has finished 4 cycle of docetaxel and carboplatin. March 2021, started on Arimidex. May 2021, patient underwent laparoscopic-assisted vaginal hysterectomy with bilateralsalpingectomy and oophorectomy 10/13/19 bilateral breast reconstruction  Porta cath removal.  03/16/20 treatment for bilateral breast asymmetry treatment.  May 2023, stopped Arimidex and switched to letrozole due to fatigue.   INTERVAL HISTORY Cheryl Mooney is a 57 y.o. female who has above history reviewed by me today presents for follow up visit for management of stage I breast cancer, ER PR positive, HER-2 negative, hereditary BRCA 2 mutation.  Patient takes Aromasin, tolerates with manageable side effects. No other new concerns  Review of Systems  Constitutional:  Negative for appetite change, chills and fever.  HENT:   Negative for hearing loss and voice change.   Eyes:  Negative for eye problems.  Respiratory:  Negative for chest tightness and cough.   Cardiovascular:  Negative for chest pain.  Gastrointestinal:  Negative for abdominal distention, abdominal pain, blood in stool and diarrhea.  Endocrine: Positive for  hot flashes.  Genitourinary:  Negative for difficulty urinating and frequency.   Musculoskeletal:  Positive for arthralgias.  Skin:  Negative for itching and rash.  Neurological:  Negative for extremity weakness.  Hematological:  Negative for adenopathy.  Psychiatric/Behavioral:  Negative for confusion. The patient is not nervous/anxious.     MEDICAL HISTORY:  Past Medical History:  Diagnosis Date   Anemia    during chemo /things seem back to normal   Benign neoplasm of sigmoid colon    Benign neoplasm of transverse colon    Breast cancer (HCC)    Depression    Encounter for antineoplastic chemotherapy 12/30/2018   Pathogenic variant in BRCA2 called [id number] identified on the [employer] Breast Cancer STAT Panel. The STAT Breast cancer panel offered by [facility] includes sequencing and rearrangement analysis for the following 9 genes:  ATM, BRCA1, BRCA2, CDH1, CHEK2, PALB2, PTEN, STK11 and TP53.  The report date is 12/30/2018. Common Hereditary Cancer Panel results pending.    Family history of BRCA2 gene positive    Family history of breast cancer    Family history of colon cancer    Family history of lung cancer    Family history of pancreatic cancer    GERD (gastroesophageal reflux disease)    History of kidney stones    Hypertension    Malignant neoplasm of lower-outer quadrant of right breast of female, estrogen receptor positive (HCC) 12/17/2018   Nausea and vomiting 09/05/2022   Pre-diabetes    Seasonal allergies  Smoker     SURGICAL HISTORY: Past Surgical History:  Procedure Laterality Date   AXILLARY SENTINEL NODE BIOPSY Bilateral 01/14/2019   Procedure: AXILLARY SENTINEL NODE BIOPSY;  Surgeon: Carolan Shiver, MD;  Location: [facility];  Service: General;  Laterality: Bilateral;   BREAST BIOPSY Right 12/08/2018   [place] bx of mass with calcs path pending   BREAST BIOPSY Right 12/08/2018   [place] bx of LN, path pending   BREAST RECONSTRUCTION WITH PLACEMENT OF TISSUE  EXPANDER AND ALLODERM Bilateral 01/14/2019   Procedure: BILATERAL BREAST RECONSTRUCTION WITH PLACEMENT OF TISSUE EXPANDER AND FLEX HD;  Surgeon: Peggye Form, DO;  Location: [facility];  Service: Plastics;  Laterality: Bilateral;   COLONOSCOPY WITH PROPOFOL N/A 02/17/2015   Procedure: COLONOSCOPY WITH PROPOFOL;  Surgeon: Midge Minium, MD;  Location[facility];  Service: Endoscopy;  Laterality: N/A;   COLONOSCOPY WITH PROPOFOL N/A 07/12/2019   Procedure: COLONOSCOPY WITH BIOPSY;  Surgeon: Midge Minium, MD;  Location: [facility];  Service: Endoscopy;  Laterality: N/A;  priority 3 PORT A CATH NEEDS ACCESSED   CYSTOSCOPY N/A 08/05/2019   Procedure: CYSTOSCOPY;  Surgeon: Natale Milch, MD;  Location: [facility];  Service: Gynecology;  Laterality: N/A;   ESOPHAGOGASTRODUODENOSCOPY (EGD) WITH PROPOFOL N/A 09/05/2022   Procedure: ESOPHAGOGASTRODUODENOSCOPY (EGD) WITH PROPOFOL polypectomy;  Surgeon: Midge Minium, MD;  Location[facility];  Service: Endoscopy;  Laterality: N/A;   LIPOSUCTION WITH LIPOFILLING Bilateral 03/16/2020   Procedure: Bilateral Lipofilling of breasts for symmetry;  Surgeon: Peggye Form, DO;  Location: [facility];  Service: Plastics;  Laterality: Bilateral;  1.5 hours   POLYPECTOMY  02/17/2015   Procedure: POLYPECTOMY;  Surgeon: Midge Minium, MD;  Location: [facility];  Service: Endoscopy;;   POLYPECTOMY N/A 07/12/2019   Procedure: POLYPECTOMY;  Surgeon: Midge Minium, MD;  Location: [facility];  Service: Endoscopy;  Laterality: N/A;   PORTA CATH REMOVAL Left 10/13/2019   Procedure: PORTA CATH REMOVAL;  Surgeon: Peggye Form, DO;  Location: [facility];  Service: Plastics;  Laterality: Left;   PORTACATH PLACEMENT Left 02/18/2019   Procedure: INSERTION PORT-A-CATH;  Surgeon: Carolan Shiver, MD;  Location: [facility];  Service: General;  Laterality: Left;   REMOVAL OF BILATERAL  TISSUE EXPANDERS WITH PLACEMENT OF BILATERAL BREAST IMPLANTS Bilateral 10/13/2019   Procedure: REMOVAL OF BILATERAL TISSUE EXPANDERS WITH PLACEMENT OF BILATERAL BREAST IMPLANTS;  Surgeon: Peggye Form, DO;  Location: [facility];  Service: Plastics;  Laterality: Bilateral;  2 hours please   SIMPLE MASTECTOMY WITH AXILLARY SENTINEL NODE BIOPSY Left 01/14/2019   Procedure: LEFT SIMPLE MASTECTOMY;  Surgeon: Carolan Shiver, MD;  Location: [facility];  Service: General;  Laterality: Left;   TOTAL LAPAROSCOPIC HYSTERECTOMY WITH BILATERAL SALPINGO OOPHORECTOMY Bilateral 08/05/2019   Procedure: TOTAL LAPAROSCOPIC HYSTERECTOMY WITH BILATERAL SALPINGO OOPHORECTOMY;  Surgeon: Natale Milch, MD;  Location: [facility];  Service: Gynecology;  Laterality: Bilateral;   TOTAL MASTECTOMY Right 01/14/2019   Procedure: RIGHT TOTAL MASTECTOMY;  Surgeon: Carolan Shiver, MD;  Location: [facility];  Service: General;  Laterality: Right;   WISDOM TOOTH EXTRACTION      SOCIAL HISTORY: Social History   Socioeconomic History   Marital status: Married    Spouse name: Jonny Ruiz   Number of children: Not on file   Years of education: Not on file   Highest education level: Not on file  Occupational History   Occupation: sub teacher    Comment: retired  Tobacco Use  Smoking status: Every Day    Current packs/day: 0.25    Average packs/day: 0.3 packs/day for 35.0 years (8.7 ttl pk-yrs)    Types: Cigarettes    Start date: 65   Smokeless tobacco: Never  Vaping Use   Vaping status: Never Used  Substance and Sexual Activity   Alcohol use: Yes    Alcohol/week: 2.0 standard drinks of alcohol    Types: 2 Standard drinks or equivalent per week    Comment: occasional   Drug use: No   Sexual activity: Yes    Birth control/protection: Surgical  Other Topics Concern   Not on file  Social History Narrative   Patient lives with husband.   Social Drivers of Research scientist (physical sciences) Strain: Low Risk  (09/14/2021)   Overall Financial Resource Strain (CARDIA)    Difficulty of Paying Living Expenses: Not hard at all  Food Insecurity: No Food Insecurity (09/14/2021)   Hunger Vital Sign    Worried About Running Out of Food in the Last Year: Never true    Ran Out of Food in the Last Year: Never true  Transportation Needs: No Transportation Needs (09/14/2021)   PRAPARE - Administrator, Civil Service (Medical): No    Lack of Transportation (Non-Medical): No  Physical Activity: Not on file  Stress: Not on file  Social Connections: Not on file  Intimate Partner Violence: Not At Risk (09/14/2021)   Humiliation, Afraid, Rape, and Kick questionnaire    Fear of Current or Ex-Partner: No    Emotionally Abused: No    Physically Abused: No    Sexually Abused: No  She is a retired Runner, broadcasting/film/video  FAMILY HISTORY: Family History  Problem Relation Age of Onset   Colon cancer Mother 19   Breast cancer Mother 34       "BRCA2+"   Hypertension Father    Stroke Father    Alzheimer's disease Father    Prostate cancer Father    Breast cancer Maternal Aunt        mat great aunt   Breast cancer Maternal Grandmother 32   Pancreatic cancer Maternal Grandfather 44    ALLERGIES:  has no known allergies.  MEDICATIONS:  Current Outpatient Medications  Medication Sig Dispense Refill   acetaminophen (TYLENOL) 500 MG tablet Take 2 tablets (1,000 mg total) by mouth every 6 (six) hours as needed for mild pain. 30 tablet 0   CALCIUM PO Take by mouth daily.     cetirizine (ZYRTEC) 10 MG tablet Take 10 mg by mouth daily.     Cholecalciferol (VITAMIN D3 PO) Take by mouth daily.     exemestane (AROMASIN) 25 MG tablet Take 1 tablet (25 mg total) by mouth daily. 90 tablet 1   lisinopril-hydrochlorothiazide (ZESTORETIC) 20-25 MG tablet Take 1 tablet by mouth daily. 90 tablet 3   multivitamin-iron-minerals-folic acid (CENTRUM) chewable tablet Chew 1 tablet by mouth daily.     No current  facility-administered medications for this visit.     PHYSICAL EXAMINATION: ECOG PERFORMANCE STATUS: 0 - Asymptomatic Vitals:   03/26/23 1357  BP: (!) 141/89  Pulse: 69  Resp: 18  Temp: (!) 97.2 F (36.2 C)  SpO2: 99%   Filed Weights   03/26/23 1357  Weight: 150 lb 8 oz (68.3 kg)    Physical Exam Constitutional:      General: She is not in acute distress. HENT:     Head: Normocephalic and atraumatic.  Eyes:     General:  No scleral icterus.    Pupils: Pupils are equal, round, and reactive to light.  Cardiovascular:     Rate and Rhythm: Normal rate and regular rhythm.     Heart sounds: Normal heart sounds.  Pulmonary:     Effort: Pulmonary effort is normal. No respiratory distress.     Breath sounds: No wheezing.  Abdominal:     General: Bowel sounds are normal. There is no distension.     Palpations: Abdomen is soft. There is no mass.     Tenderness: There is no abdominal tenderness.  Musculoskeletal:        General: No deformity. Normal range of motion.     Cervical back: Normal range of motion and neck supple.  Skin:    General: Skin is warm and dry.     Findings: No erythema or rash.  Neurological:     Mental Status: She is alert and oriented to person, place, and time. Mental status is at baseline.     Cranial Nerves: No cranial nerve deficit.     Coordination: Coordination normal.  Psychiatric:        Mood and Affect: Mood normal.     LABORATORY DATA:  I have reviewed the data as listed Lab Results  Component Value Date   WBC 8.4 03/26/2023   HGB 15.3 (H) 03/26/2023   HCT 45.8 03/26/2023   MCV 94.8 03/26/2023   PLT 305 03/26/2023   Recent Labs    09/24/22 1409 01/14/23 1057 03/26/23 1321  NA 133* 137 133*  K 4.4 5.0 3.9  CL 97* 99 99  CO2 28 21 23   GLUCOSE 185* 128* 173*  BUN 8 16 7   CREATININE 0.67 0.76 0.73  CALCIUM 9.5 10.6* 9.4  GFRNONAA >60  --  >60  PROT 7.9 7.9 7.6  ALBUMIN 4.6 4.8 4.2  AST 23 19 23   ALT 18 14 15   ALKPHOS 70  113 70  BILITOT 0.7 0.2 0.5     RADIOGRAPHIC STUDIES: I have personally reviewed the radiological images as listed and agreed with the findings in the report. No results found.

## 2023-03-26 NOTE — Assessment & Plan Note (Addendum)
status post hysterectomy with bilateral salpingo-oophorectomy.  Annual dermatology evaluation. She has a family history of pancreatic cancer.  obtain annual MRI abdomen with and without contrast -patient would like to defer for now.

## 2023-03-26 NOTE — Assessment & Plan Note (Addendum)
Stage IB pT2 pN0 M0 ER + PR+ HER2 negative right breast cancer, grade 3, +LVI,  Bilateral mastectomy and reconstruction [01/14/2019]  -BRCA2 positive Labs reviewed and discussed with patient. Arimidex --> letrozole--> Aromasin, plan total 5 years of endocrine therapy, until March 2026 Continue Aromasin.  Refills were sent to pharmacy.

## 2023-03-27 LAB — CANCER ANTIGEN 15-3: CA 15-3: 16.1 U/mL (ref 0.0–25.0)

## 2023-03-27 LAB — CANCER ANTIGEN 27.29: CA 27.29: 15.7 U/mL (ref 0.0–38.6)

## 2023-04-07 ENCOUNTER — Encounter: Payer: Self-pay | Admitting: Oncology

## 2023-04-28 ENCOUNTER — Other Ambulatory Visit: Payer: Self-pay | Admitting: Oncology

## 2023-05-05 ENCOUNTER — Telehealth: Payer: Self-pay | Admitting: *Deleted

## 2023-05-05 ENCOUNTER — Encounter: Payer: Self-pay | Admitting: Oncology

## 2023-05-05 MED ORDER — EXEMESTANE 25 MG PO TABS: 25.0000 mg | ORAL_TABLET | Freq: Every day | ORAL | 1 refills | Status: DC

## 2023-05-05 NOTE — Telephone Encounter (Signed)
Patient called and said that she had put in for a prescription of the exemestane but the pharmacy does not have an order for it.  I called the patient back and told her that I already put the order in but Dr. Cathie Hoops has to sign it so it will be done today.  Patient says that is great for her

## 2023-07-15 ENCOUNTER — Encounter: Payer: Self-pay | Admitting: Oncology

## 2023-07-15 ENCOUNTER — Encounter: Payer: Self-pay | Admitting: Internal Medicine

## 2023-07-15 ENCOUNTER — Ambulatory Visit: Payer: Self-pay | Admitting: Internal Medicine

## 2023-07-15 VITALS — BP 116/70 | HR 70 | Ht 64.0 in | Wt 148.1 lb

## 2023-07-15 DIAGNOSIS — I10 Essential (primary) hypertension: Secondary | ICD-10-CM | POA: Diagnosis not present

## 2023-07-15 DIAGNOSIS — R7303 Prediabetes: Secondary | ICD-10-CM

## 2023-07-15 LAB — POCT GLYCOSYLATED HEMOGLOBIN (HGB A1C): Hemoglobin A1C: 6.2 % — AB (ref 4.0–5.6)

## 2023-07-15 NOTE — Assessment & Plan Note (Addendum)
 Managed with diet and exercise. Has lost a few pounds. Will continue current regimen - no medications are needed at this time. A1C down from 6.5 to 6.2 Lab Results  Component Value Date   HGBA1C 6.2 (A) 07/15/2023   HGBA1C 6.5 (H) 01/14/2023   HGBA1C 6.4 (A) 05/20/2022

## 2023-07-15 NOTE — Assessment & Plan Note (Signed)
 Blood pressure is well controlled.  Current medications lisinopril hct. Will continue same regimen along with efforts to limit dietary sodium.

## 2023-07-15 NOTE — Progress Notes (Signed)
 Date:  07/15/2023   Name:  Cheryl Mooney   DOB:  Feb 09, 1966   MRN:  [id number]   Chief Complaint: Hypertension and Prediabetes  Hypertension This is a chronic problem. The problem is controlled. Associated symptoms include headaches. Pertinent negatives include no chest pain, palpitations or shortness of breath. Past treatments include ACE inhibitors and diuretics.  Diabetes She presents for her follow-up diabetic visit. Diabetes type: prediabetes. Her disease course has been improving. Hypoglycemia symptoms include headaches. Pertinent negatives for hypoglycemia include no dizziness or nervousness/anxiousness. Pertinent negatives for diabetes include no chest pain and no fatigue. Current diabetic treatment includes diet. She is compliant with treatment most of the time.    Review of Systems  Constitutional:  Negative for chills, fatigue and fever.  Respiratory:  Negative for chest tightness and shortness of breath.   Cardiovascular:  Negative for chest pain, palpitations and leg swelling.  Allergic/Immunologic: Positive for environmental allergies.  Neurological:  Positive for headaches. Negative for dizziness and light-headedness.  Psychiatric/Behavioral:  Negative for dysphoric mood and sleep disturbance. The patient is not nervous/anxious.      Lab Results  Component Value Date   NA 133 (L) 03/26/2023   K 3.9 03/26/2023   CO2 23 03/26/2023   GLUCOSE 173 (H) 03/26/2023   BUN 7 03/26/2023   CREATININE 0.73 03/26/2023   CALCIUM 9.4 03/26/2023   EGFR 90 01/14/2023   GFRNONAA >60 03/26/2023   Lab Results  Component Value Date   CHOL 267 (H) 01/14/2023   HDL 55 01/14/2023   LDLCALC 188 (H) 01/14/2023   TRIG 134 01/14/2023   CHOLHDL 4.9 (H) 01/14/2023   Lab Results  Component Value Date   TSH 1.390 01/14/2023   Lab Results  Component Value Date   HGBA1C 6.2 (A) 07/15/2023   Lab Results  Component Value Date   WBC 8.4 03/26/2023   HGB 15.3 (H) 03/26/2023   HCT  45.8 03/26/2023   MCV 94.8 03/26/2023   PLT 305 03/26/2023   Lab Results  Component Value Date   ALT 15 03/26/2023   AST 23 03/26/2023   ALKPHOS 70 03/26/2023   BILITOT 0.5 03/26/2023   Lab Results  Component Value Date   VD25OH 35.4 07/10/2021     Patient Active Problem List   Diagnosis Date Noted   Regurgitation of food 01/14/2023   Chemotherapy-induced neuropathy (HCC) 09/24/2022   Nausea and vomiting 09/05/2022   Gastroesophageal reflux disease 01/11/2022   S/P total hysterectomy and bilateral salpingo-oophorectomy 01/09/2021   Osteopenia 01/09/2021   Breast asymmetry following reconstructive surgery 02/22/2020   Vitamin D deficiency 11/26/2019   Prediabetes 11/26/2019   S/P breast reconstruction, bilateral 11/07/2019   Carrier of high risk cancer gene mutation    History of colonic polyps    Polyp of sigmoid colon    Acquired absence of bilateral breasts and nipples 07/06/2019   Goals of care, counseling/discussion 03/26/2019   Hyponatremia 03/08/2019   Constipation 03/08/2019   Chest wall pain following surgery 03/08/2019   BRCA2 gene mutation positive in female 12/30/2018   Family history of pancreatic cancer    Family history of lung cancer    Family history of BRCA2 gene positive    Family history of colon cancer    Malignant neoplasm of lower-outer quadrant of right breast of female, estrogen receptor positive (HCC) 12/17/2018   Hyperlipidemia, mixed 11/19/2016   Tubular adenoma of colon 02/21/2015   Tobacco use disorder 11/27/2014   Essential hypertension 11/27/2014  Hx of abnormal cervical Pap smear 09/07/2011    No Known Allergies  Past Surgical History:  Procedure Laterality Date   AXILLARY SENTINEL NODE BIOPSY Bilateral 01/14/2019   Procedure: AXILLARY SENTINEL NODE BIOPSY;  Surgeon: Carolan Shiver, MD;  Location: [facility];  Service: General;  Laterality: Bilateral;   BREAST BIOPSY Right 12/08/2018   [place] bx of mass with calcs path pending    BREAST BIOPSY Right 12/08/2018   [place] bx of LN, path pending   BREAST RECONSTRUCTION WITH PLACEMENT OF TISSUE EXPANDER AND ALLODERM Bilateral 01/14/2019   Procedure: BILATERAL BREAST RECONSTRUCTION WITH PLACEMENT OF TISSUE EXPANDER AND FLEX HD;  Surgeon: Peggye Form, DO;  Location: [facility];  Service: Plastics;  Laterality: Bilateral;   COLONOSCOPY WITH PROPOFOL N/A 02/17/2015   Procedure: COLONOSCOPY WITH PROPOFOL;  Surgeon: Midge Minium, MD;  Location: [facility];  Service: Endoscopy;  Laterality: N/A;   COLONOSCOPY WITH PROPOFOL N/A 07/12/2019   Procedure: COLONOSCOPY WITH BIOPSY;  Surgeon: Midge Minium, MD;  Location: [facility];  Service: Endoscopy;  Laterality: N/A;  priority 3 PORT A CATH NEEDS ACCESSED   CYSTOSCOPY N/A 08/05/2019   Procedure: CYSTOSCOPY;  Surgeon: Natale Milch, MD;  Location: [facility];  Service: Gynecology;  Laterality: N/A;   ESOPHAGOGASTRODUODENOSCOPY (EGD) WITH PROPOFOL N/A 09/05/2022   Procedure: ESOPHAGOGASTRODUODENOSCOPY (EGD) WITH PROPOFOL polypectomy;  Surgeon: Midge Minium, MD;  Location: [facility];  Service: Endoscopy;  Laterality: N/A;   LIPOSUCTION WITH LIPOFILLING Bilateral 03/16/2020   Procedure: Bilateral Lipofilling of breasts for symmetry;  Surgeon: Peggye Form, DO;  Location: [facility];  Service: Plastics;  Laterality: Bilateral;  1.5 hours   POLYPECTOMY  02/17/2015   Procedure: POLYPECTOMY;  Surgeon: Midge Minium, MD;  Location: [facility];  Service: Endoscopy;;   POLYPECTOMY N/A 07/12/2019   Procedure: POLYPECTOMY;  Surgeon: Midge Minium, MD;  Location: [facility];  Service: Endoscopy;  Laterality: N/A;   PORTA CATH REMOVAL Left 10/13/2019   Procedure: PORTA CATH REMOVAL;  Surgeon: Peggye Form, DO;  Location: [facility];  Service: Plastics;  Laterality: Left;   PORTACATH PLACEMENT Left 02/18/2019   Procedure: INSERTION PORT-A-CATH;  Surgeon:  Carolan Shiver, MD;  Location: [facility];  Service: General;  Laterality: Left;   REMOVAL OF BILATERAL TISSUE EXPANDERS WITH PLACEMENT OF BILATERAL BREAST IMPLANTS Bilateral 10/13/2019   Procedure: REMOVAL OF BILATERAL TISSUE EXPANDERS WITH PLACEMENT OF BILATERAL BREAST IMPLANTS;  Surgeon: Peggye Form, DO;  Location: [facility];  Service: Plastics;  Laterality: Bilateral;  2 hours please   SIMPLE MASTECTOMY WITH AXILLARY SENTINEL NODE BIOPSY Left 01/14/2019   Procedure: LEFT SIMPLE MASTECTOMY;  Surgeon: Carolan Shiver, MD;  Location: [facility];  Service: General;  Laterality: Left;   TOTAL LAPAROSCOPIC HYSTERECTOMY WITH BILATERAL SALPINGO OOPHORECTOMY Bilateral 08/05/2019   Procedure: TOTAL LAPAROSCOPIC HYSTERECTOMY WITH BILATERAL SALPINGO OOPHORECTOMY;  Surgeon: Natale Milch, MD;  Location: [facility];  Service: Gynecology;  Laterality: Bilateral;   TOTAL MASTECTOMY Right 01/14/2019   Procedure: RIGHT TOTAL MASTECTOMY;  Surgeon: Carolan Shiver, MD;  Location: [facility];  Service: General;  Laterality: Right;   WISDOM TOOTH EXTRACTION      Social History   Tobacco Use   Smoking status: Every Day    Current packs/day: 0.25    Average packs/day: 0.3 packs/day for 35.3 years (8.8 ttl pk-yrs)    Types: Cigarettes    Start date: 32   Smokeless tobacco: Never  Vaping Use   Vaping  status: Never Used  Substance Use Topics   Alcohol use: Yes    Alcohol/week: 2.0 standard drinks of alcohol    Types: 2 Standard drinks or equivalent per week    Comment: occasional   Drug use: No     Medication list has been reviewed and updated.  Current Meds  Medication Sig   acetaminophen (TYLENOL) 500 MG tablet Take 2 tablets (1,000 mg total) by mouth every 6 (six) hours as needed for mild pain.   CALCIUM PO Take by mouth daily.   cetirizine (ZYRTEC) 10 MG tablet Take 10 mg by mouth daily.   Cholecalciferol (VITAMIN D3 PO) Take by mouth daily.    exemestane (AROMASIN) 25 MG tablet Take 1 tablet (25 mg total) by mouth daily.   lisinopril-hydrochlorothiazide (ZESTORETIC) 20-25 MG tablet Take 1 tablet by mouth daily.   multivitamin-iron-minerals-folic acid (CENTRUM) chewable tablet Chew 1 tablet by mouth daily.       07/15/2023   10:11 AM 01/14/2023   10:10 AM 05/20/2022   10:10 AM 01/11/2022   10:11 AM  GAD 7 : Generalized Anxiety Score  Nervous, Anxious, on Edge 1 1 0 0  Control/stop worrying 0 1 0 0  Worry too much - different things 0 1 0 0  Trouble relaxing 1 0 0 0  Restless 0 0 0 0  Easily annoyed or irritable 1 0 0 1  Afraid - awful might happen 0 0 0 0  Total GAD 7 Score 3 3 0 1  Anxiety Difficulty Not difficult at all Not difficult at all Not difficult at all Not difficult at all       07/15/2023   10:10 AM 01/14/2023   10:09 AM 05/20/2022   10:10 AM  Depression screen PHQ 2/9  Decreased Interest 0 0 0  Down, Depressed, Hopeless 0 1 0  PHQ - 2 Score 0 1 0  Altered sleeping 2 1 0  Tired, decreased energy 0 3 0  Change in appetite 1 0 0  Feeling bad or failure about yourself  0 0 0  Trouble concentrating 0 0 0  Moving slowly or fidgety/restless 0 0 0  Suicidal thoughts 0 0 0  PHQ-9 Score 3 5 0  Difficult doing work/chores Not difficult at all Not difficult at all Not difficult at all    BP Readings from Last 3 Encounters:  07/15/23 116/70  03/26/23 (!) 141/89  01/14/23 114/76    Physical Exam Vitals and nursing note reviewed.  Constitutional:      General: She is not in acute distress.    Appearance: Normal appearance. She is well-developed.  HENT:     Head: Normocephalic and atraumatic.  Cardiovascular:     Rate and Rhythm: Normal rate and regular rhythm.  Pulmonary:     Effort: Pulmonary effort is normal. No respiratory distress.     Breath sounds: No wheezing or rhonchi.  Musculoskeletal:     Cervical back: Normal range of motion.     Right lower leg: No edema.     Left lower leg: No edema.   Skin:    General: Skin is warm and dry.     Findings: No rash.  Neurological:     Mental Status: She is alert and oriented to person, place, and time.  Psychiatric:        Mood and Affect: Mood normal.        Behavior: Behavior normal.     Wt Readings from Last 3 Encounters:  07/15/23  148 lb 2 oz (67.2 kg)  03/26/23 150 lb 8 oz (68.3 kg)  01/14/23 151 lb (68.5 kg)    BP 116/70   Pulse 70   Ht 5\' 4"  (1.626 m)   Wt 148 lb 2 oz (67.2 kg)   SpO2 99%   BMI 25.43 kg/m   Assessment and Plan:  Problem List Items Addressed This Visit       Unprioritized   Essential hypertension (Chronic)   Blood pressure is well controlled.  Current medications lisinopril hct. Will continue same regimen along with efforts to limit dietary sodium.       Prediabetes - Primary (Chronic)   Managed with diet and exercise. Has lost a few pounds. Will continue current regimen - no medications are needed at this time. A1C down from 6.5 to 6.2 Lab Results  Component Value Date   HGBA1C 6.2 (A) 07/15/2023   HGBA1C 6.5 (H) 01/14/2023   HGBA1C 6.4 (A) 05/20/2022         Relevant Orders   POCT glycosylated hemoglobin (Hb A1C) (Completed)    Return in about 6 months (around 01/14/2024) for CPX.    Reubin Milan, MD [facility] [place]

## 2023-07-17 ENCOUNTER — Other Ambulatory Visit: Payer: Self-pay | Admitting: Physician Assistant

## 2023-09-24 ENCOUNTER — Inpatient Hospital Stay (HOSPITAL_BASED_OUTPATIENT_CLINIC_OR_DEPARTMENT_OTHER): Payer: BC Managed Care – PPO | Admitting: Oncology

## 2023-09-24 ENCOUNTER — Encounter: Payer: Self-pay | Admitting: Oncology

## 2023-09-24 ENCOUNTER — Inpatient Hospital Stay: Payer: BC Managed Care – PPO | Attending: Oncology

## 2023-09-24 ENCOUNTER — Inpatient Hospital Stay: Payer: BC Managed Care – PPO

## 2023-09-24 VITALS — BP 165/87 | HR 62

## 2023-09-24 VITALS — BP 155/95 | HR 70 | Temp 97.0°F | Resp 18 | Wt 149.0 lb

## 2023-09-24 DIAGNOSIS — Z1509 Genetic susceptibility to other malignant neoplasm: Secondary | ICD-10-CM | POA: Diagnosis not present

## 2023-09-24 DIAGNOSIS — F17210 Nicotine dependence, cigarettes, uncomplicated: Secondary | ICD-10-CM | POA: Insufficient documentation

## 2023-09-24 DIAGNOSIS — Z79811 Long term (current) use of aromatase inhibitors: Secondary | ICD-10-CM | POA: Diagnosis not present

## 2023-09-24 DIAGNOSIS — Z90710 Acquired absence of both cervix and uterus: Secondary | ICD-10-CM | POA: Diagnosis not present

## 2023-09-24 DIAGNOSIS — Z170 Estrogen receptor positive status [ER+]: Secondary | ICD-10-CM

## 2023-09-24 DIAGNOSIS — D751 Secondary polycythemia: Secondary | ICD-10-CM | POA: Diagnosis not present

## 2023-09-24 DIAGNOSIS — Z800 Family history of malignant neoplasm of digestive organs: Secondary | ICD-10-CM | POA: Diagnosis not present

## 2023-09-24 DIAGNOSIS — Z1502 Genetic susceptibility to malignant neoplasm of ovary: Secondary | ICD-10-CM

## 2023-09-24 DIAGNOSIS — Z923 Personal history of irradiation: Secondary | ICD-10-CM | POA: Diagnosis not present

## 2023-09-24 DIAGNOSIS — Z1501 Genetic susceptibility to malignant neoplasm of breast: Secondary | ICD-10-CM

## 2023-09-24 DIAGNOSIS — M8580 Other specified disorders of bone density and structure, unspecified site: Secondary | ICD-10-CM

## 2023-09-24 DIAGNOSIS — C50511 Malignant neoplasm of lower-outer quadrant of right female breast: Secondary | ICD-10-CM

## 2023-09-24 DIAGNOSIS — Z1721 Progesterone receptor positive status: Secondary | ICD-10-CM | POA: Insufficient documentation

## 2023-09-24 DIAGNOSIS — Z9013 Acquired absence of bilateral breasts and nipples: Secondary | ICD-10-CM | POA: Diagnosis not present

## 2023-09-24 DIAGNOSIS — Z1732 Human epidermal growth factor receptor 2 negative status: Secondary | ICD-10-CM | POA: Diagnosis not present

## 2023-09-24 DIAGNOSIS — Z803 Family history of malignant neoplasm of breast: Secondary | ICD-10-CM | POA: Diagnosis not present

## 2023-09-24 DIAGNOSIS — M85852 Other specified disorders of bone density and structure, left thigh: Secondary | ICD-10-CM

## 2023-09-24 LAB — CMP (CANCER CENTER ONLY)
ALT: 19 U/L (ref 0–44)
AST: 19 U/L (ref 15–41)
Albumin: 4.1 g/dL (ref 3.5–5.0)
Alkaline Phosphatase: 74 U/L (ref 38–126)
Anion gap: 11 (ref 5–15)
BUN: 10 mg/dL (ref 6–20)
CO2: 23 mmol/L (ref 22–32)
Calcium: 9 mg/dL (ref 8.9–10.3)
Chloride: 96 mmol/L — ABNORMAL LOW (ref 98–111)
Creatinine: 0.63 mg/dL (ref 0.44–1.00)
GFR, Estimated: >60 mL/min (ref >60)
Glucose, Bld: 164 mg/dL — ABNORMAL HIGH (ref 70–99)
Potassium: 3.5 mmol/L (ref 3.5–5.1)
Sodium: 130 mmol/L — ABNORMAL LOW (ref 135–145)
Total Bilirubin: 0.8 mg/dL (ref 0.0–1.2)
Total Protein: 7.4 g/dL (ref 6.5–8.1)

## 2023-09-24 LAB — CBC WITH DIFFERENTIAL (CANCER CENTER ONLY)
Abs Immature Granulocytes: 0.02 10*3/uL (ref 0.00–0.07)
Basophils Absolute: 0.1 10*3/uL (ref 0.0–0.1)
Basophils Relative: 1 %
Eosinophils Absolute: 0.2 10*3/uL (ref 0.0–0.5)
Eosinophils Relative: 2 %
HCT: 46.7 % — ABNORMAL HIGH (ref 36.0–46.0)
Hemoglobin: 16.1 g/dL — ABNORMAL HIGH (ref 12.0–15.0)
Immature Granulocytes: 0 %
Lymphocytes Relative: 35 %
Lymphs Abs: 2.7 10*3/uL (ref 0.7–4.0)
MCH: 32.4 pg (ref 26.0–34.0)
MCHC: 34.5 g/dL (ref 30.0–36.0)
MCV: 94 fL (ref 80.0–100.0)
Monocytes Absolute: 0.5 10*3/uL (ref 0.1–1.0)
Monocytes Relative: 6 %
Neutro Abs: 4.4 10*3/uL (ref 1.7–7.7)
Neutrophils Relative %: 56 %
Platelet Count: 294 10*3/uL (ref 150–400)
RBC: 4.97 MIL/uL (ref 3.87–5.11)
RDW: 13.1 % (ref 11.5–15.5)
WBC Count: 7.8 10*3/uL (ref 4.0–10.5)
nRBC: 0 % (ref 0.0–0.2)

## 2023-09-24 MED ORDER — ZOLEDRONIC ACID 4 MG/100ML IV SOLN
4.0000 mg | Freq: Once | INTRAVENOUS | Status: AC
  Administered 2023-09-24: 4 mg via INTRAVENOUS
  Filled 2023-09-24: qty 100

## 2023-09-24 MED ORDER — SODIUM CHLORIDE 0.9 % IV SOLN
Freq: Once | INTRAVENOUS | Status: AC
  Filled 2023-09-24: qty 250

## 2023-09-24 MED ORDER — EXEMESTANE 25 MG PO TABS: 25.0000 mg | ORAL_TABLET | Freq: Every day | ORAL | 1 refills | Status: DC

## 2023-09-24 NOTE — Progress Notes (Signed)
 Hematology/Oncology Progress note [phone] [phone]      Patient Care Team: Sheron Dixons, MD as PCP - General (Internal Medicine) Mariane Shire, MD (Dermatology) Burnie Cartwright, RN as Registered Nurse Eldred Grego, MD as Consulting Physician (General Surgery) Timmy Forbes, MD as Consulting Physician (Oncology) Dillingham, Lindaann Requena, DO as Attending Physician (Plastic Surgery)    ASSESSMENT & PLAN:   Cancer Staging  Malignant neoplasm of lower-outer quadrant of right breast of female, estrogen receptor positive (HCC) Staging form: Breast, AJCC 8th Edition - Clinical: Stage IIA (cT2, cN0, cM0, G3, ER+, PR+, HER2: Equivocal) - Signed by Timmy Forbes, MD on 03/26/2022 - Pathologic stage from 01/21/2019: Stage IB (pT2, pN0, cM0, G3, ER+, PR+, HER2-) - Signed by Timmy Forbes, MD on 01/22/2019   Malignant neoplasm of lower-outer quadrant of right breast of female, estrogen receptor positive (HCC) Stage IB pT2 pN0 M0 ER + PR+ HER2 negative right breast cancer, grade 3, +LVI,  Bilateral mastectomy and reconstruction [01/14/2019]  -BRCA2 positive Labs reviewed and discussed with patient. Arimidex  --> letrozole --> Aromasin , plan total 5 years of endocrine therapy, until March 2026 Continue Aromasin .  Refills were sent to pharmacy.  BRCA2 gene mutation positive in female status post hysterectomy with bilateral salpingo-oophorectomy.  Annual dermatology evaluation. She has a family history of pancreatic cancer.  obtain annual MRI abdomen with and without contrast -patient declines.   Osteopenia Zometa  every 6 months-proceed with Zometa  today..   Repeat DEXA showed improved, currently osteopenic T score -2.3 Proceed with Zometa    Erythrocytosis Likely secondary. Check BCR-ABL 1 FISH, JAK2 mutation with reflex.    Orders Placed This Encounter  Procedures   CBC with Differential (Cancer Center Only)    Standing Status:   Future    Expected  Date:   03/25/2024    Expiration Date:   06/23/2024   CMP (Cancer Center only)    Standing Status:   Future    Expected Date:   03/25/2024    Expiration Date:   06/23/2024   JAK2 [id number] rfx CALR/MPL/E12-15    Standing Status:   Future    Expected Date:   03/25/2024    Expiration Date:   06/23/2024   BCR-ABL1 FISH    Standing Status:   Future    Expected Date:   03/25/2024    Expiration Date:   06/23/2024   Follow up in 6 months.  All questions were answered. The patient knows to call the clinic with any problems, questions or concerns.  Timmy Forbes, MD, PhD [facility] Hematology Oncology 09/24/2023     CHIEF COMPLAINTS/REASON FOR VISIT:  Follow-up for breast cancer.  HISTORY OF PRESENTING ILLNESS:  Cheryl Mooney is a  58 y.o.  female with PMH listed below who was referred to me for evaluation of breast cancer Patient reports feeling mass of her right breast week before her annual mammogram. 12/03/2018 patient had bilateral diagnostic mammogram and ultrasound  which showed suspicious 2.1 x 1.3 x 1.6 irregular mass at 7:00 in the right breast, 2 cm from the nipple. 2 borderline lymph nodes are seen in the right axilla.  1 of the nodes demonstrate a cortex of 4.4 mm.  Both lymph nodes demonstrated retention of fatty hila. Patient underwent ultrasound-guided biopsy of right breast mass as well as ultrasound-guided biopsy of 1 of the 2 mildly abnormal right axillary lymph nodes. 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 Right axilla lymph node negative  for malignancy. ER> 90% positive, PR11-50% positive, HER-2 equivocal, 2+ by IHC.  FISH negative.   Nipple discharge: Denies Family history: Mother diagnosed with breast cancer at age of 62, and colon cancer at age of 17.  Mother has BRCA2 mutation.   Maternal grandmother breast cancer, maternal great aunt breast cancer.  Patient recalls that she was tested long  time ago and was not aware of any results.  OCP use: In her 20s-30s Estrogen and progesterone therapy: denies History of radiation to chest: denies.  Previous breast surgery: Denies  BRCA 2 positive.  # 01/14/2019 patient underwent bilateral mastectomy with sentinel lymph node biopsy of left and the right axillary. Right breast showed invasive mammary carcinoma, DCIS positive, apocrine metaplastic, stromal fibrosis, duct ectasia, simple cyst formation, usual epithelial hyperplasia.  Sentinel lymph node on the right side was negative. pT2 pN0 Left prophylactic mastectomy and a sentinel lymph node negative for malignancy.  #OncotypeDX recurrence score 31, adjuvant chemotherapy benefit more than 15%. # She follows up with plastic surgeon Dr. Orin Birk, she has expander, expanding process is being during chemotherapy. Mediport was placed by Dr. Charmel Cooter on 02/18/2019.  Patient had 1 cycle of AC, chemotherapy plan was switched to docetaxel  and carboplatin  after consulting expert opinion. She has finished 4 cycle of docetaxel  and carboplatin . March 2021, started on Arimidex . May 2021, patient underwent laparoscopic-assisted vaginal hysterectomy with bilateralsalpingectomy and oophorectomy 10/13/19 bilateral breast reconstruction  Porta cath removal.  03/16/20 treatment for bilateral breast asymmetry treatment.  May 2023, stopped Arimidex  and switched to letrozole  due to fatigue.   INTERVAL HISTORY Cheryl Mooney is a 58 y.o. female who has above history reviewed by me today presents for follow up visit for management of stage I breast cancer, ER PR positive, HER-2 negative, hereditary BRCA 2 mutation.  Patient takes Aromasin , tolerates with manageable side effects. No other new concerns  Review of Systems  Constitutional:  Negative for appetite change, chills and fever.  HENT:   Negative for hearing loss and voice change.   Eyes:  Negative for eye problems.  Respiratory:  Negative for chest  tightness and cough.   Cardiovascular:  Negative for chest pain.  Gastrointestinal:  Negative for abdominal distention, abdominal pain, blood in stool and diarrhea.  Endocrine: Positive for hot flashes.  Genitourinary:  Negative for difficulty urinating and frequency.   Musculoskeletal:  Positive for arthralgias.  Skin:  Negative for itching and rash.  Neurological:  Negative for extremity weakness.  Hematological:  Negative for adenopathy.  Psychiatric/Behavioral:  Negative for confusion. The patient is not nervous/anxious.     MEDICAL HISTORY:  Past Medical History:  Diagnosis Date   Anemia    during chemo /things seem back to normal   Benign neoplasm of sigmoid colon    Benign neoplasm of transverse colon    Breast cancer (HCC)    Depression    Encounter for antineoplastic chemotherapy 12/30/2018   Pathogenic variant in BRCA2 called [id number] identified on the [facility] Breast Cancer STAT Panel. The STAT Breast cancer panel offered by [facility] includes sequencing and rearrangement analysis for the following 9 genes:  ATM, BRCA1, BRCA2, CDH1, CHEK2, PALB2, PTEN, STK11 and TP53.  The report date is 12/30/2018. Common Hereditary Cancer Panel results pending.    Family history of BRCA2 gene positive    Family history of breast cancer    Family history of colon cancer    Family history of lung cancer    Family history of pancreatic cancer  GERD (gastroesophageal reflux disease)    History of kidney stones    Hypertension    Malignant neoplasm of lower-outer quadrant of right breast of female, estrogen receptor positive (HCC) 12/17/2018   Nausea and vomiting 09/05/2022   Pre-diabetes    Seasonal allergies    Smoker     SURGICAL HISTORY: Past Surgical History:  Procedure Laterality Date   AXILLARY SENTINEL NODE BIOPSY Bilateral 01/14/2019   Procedure: AXILLARY SENTINEL NODE BIOPSY;  Surgeon: Eldred Grego, MD;  Location: [facility];  Service: General;  Laterality:  Bilateral;   BREAST BIOPSY Right 12/08/2018   us  bx of mass with calcs path pending   BREAST BIOPSY Right 12/08/2018   us  bx of LN, path pending   BREAST RECONSTRUCTION WITH PLACEMENT OF TISSUE EXPANDER AND ALLODERM Bilateral 01/14/2019   Procedure: BILATERAL BREAST RECONSTRUCTION WITH PLACEMENT OF TISSUE EXPANDER AND FLEX HD;  Surgeon: Thornell Flirt, DO;  Location: [facility];  Service: Plastics;  Laterality: Bilateral;   COLONOSCOPY WITH PROPOFOL  N/A 02/17/2015   Procedure: COLONOSCOPY WITH PROPOFOL ;  Surgeon: Marnee Sink, MD;  Location: [facility];  Service: Endoscopy;  Laterality: N/A;   COLONOSCOPY WITH PROPOFOL  N/A 07/12/2019   Procedure: COLONOSCOPY WITH BIOPSY;  Surgeon: Marnee Sink, MD;  Location: [facility];  Service: Endoscopy;  Laterality: N/A;  priority 3 PORT A CATH NEEDS ACCESSED   CYSTOSCOPY N/A 08/05/2019   Procedure: CYSTOSCOPY;  Surgeon: Heron Lord, MD;  Location: [facility];  Service: Gynecology;  Laterality: N/A;   ESOPHAGOGASTRODUODENOSCOPY (EGD) WITH PROPOFOL  N/A 09/05/2022   Procedure: ESOPHAGOGASTRODUODENOSCOPY (EGD) WITH PROPOFOL  polypectomy;  Surgeon: Marnee Sink, MD;  Location: [facility];  Service: Endoscopy;  Laterality: N/A;   LIPOSUCTION WITH LIPOFILLING Bilateral 03/16/2020   Procedure: Bilateral Lipofilling of breasts for symmetry;  Surgeon: Thornell Flirt, DO;  Location: [facility];  Service: Plastics;  Laterality: Bilateral;  1.5 hours   POLYPECTOMY  02/17/2015   Procedure: POLYPECTOMY;  Surgeon: Marnee Sink, MD;  Location: [facility];  Service: Endoscopy;;   POLYPECTOMY N/A 07/12/2019   Procedure: POLYPECTOMY;  Surgeon: Marnee Sink, MD;  Location: [facility];  Service: Endoscopy;  Laterality: N/A;   PORTA CATH REMOVAL Left 10/13/2019   Procedure: PORTA CATH REMOVAL;  Surgeon: Thornell Flirt, DO;  Location: [facility];  Service: Plastics;  Laterality: Left;    PORTACATH PLACEMENT Left 02/18/2019   Procedure: INSERTION PORT-A-CATH;  Surgeon: Eldred Grego, MD;  Location: [facility];  Service: General;  Laterality: Left;   REMOVAL OF BILATERAL TISSUE EXPANDERS WITH PLACEMENT OF BILATERAL BREAST IMPLANTS Bilateral 10/13/2019   Procedure: REMOVAL OF BILATERAL TISSUE EXPANDERS WITH PLACEMENT OF BILATERAL BREAST IMPLANTS;  Surgeon: Thornell Flirt, DO;  Location: [facility];  Service: Plastics;  Laterality: Bilateral;  2 hours please   SIMPLE MASTECTOMY WITH AXILLARY SENTINEL NODE BIOPSY Left 01/14/2019   Procedure: LEFT SIMPLE MASTECTOMY;  Surgeon: Eldred Grego, MD;  Location: [facility];  Service: General;  Laterality: Left;   TOTAL LAPAROSCOPIC HYSTERECTOMY WITH BILATERAL SALPINGO OOPHORECTOMY Bilateral 08/05/2019   Procedure: TOTAL LAPAROSCOPIC HYSTERECTOMY WITH BILATERAL SALPINGO OOPHORECTOMY;  Surgeon: Heron Lord, MD;  Location: [facility];  Service: Gynecology;  Laterality: Bilateral;   TOTAL MASTECTOMY Right 01/14/2019   Procedure: RIGHT TOTAL MASTECTOMY;  Surgeon: Eldred Grego, MD;  Location: [facility];  Service: General;  Laterality: Right;   WISDOM TOOTH EXTRACTION      SOCIAL HISTORY: Social History   Socioeconomic History  Marital status: Married    Spouse name: Autry Legions   Number of children: Not on file   Years of education: Not on file   Highest education level: Not on file  Occupational History   Occupation: sub teacher    Comment: retired  Tobacco Use   Smoking status: Every Day    Current packs/day: 0.25    Average packs/day: 0.3 packs/day for 35.5 years (8.9 ttl pk-yrs)    Types: Cigarettes    Start date: 24   Smokeless tobacco: Never  Vaping Use   Vaping status: Never Used  Substance and Sexual Activity   Alcohol use: Yes    Alcohol/week: 2.0 standard drinks of alcohol    Types: 2 Standard drinks or equivalent per week    Comment: occasional   Drug use: No   Sexual  activity: Yes    Birth control/protection: Surgical  Other Topics Concern   Not on file  Social History Narrative   Patient lives with husband.   Social Drivers of Corporate investment banker Strain: Low Risk  (09/14/2021)   Overall Financial Resource Strain (CARDIA)    Difficulty of Paying Living Expenses: Not hard at all  Food Insecurity: No Food Insecurity (09/14/2021)   Hunger Vital Sign    Worried About Running Out of Food in the Last Year: Never true    Ran Out of Food in the Last Year: Never true  Transportation Needs: No Transportation Needs (09/14/2021)   PRAPARE - Administrator, Civil Service (Medical): No    Lack of Transportation (Non-Medical): No  Physical Activity: Not on file  Stress: Not on file  Social Connections: Not on file  Intimate Partner Violence: Not At Risk (09/14/2021)   Humiliation, Afraid, Rape, and Kick questionnaire    Fear of Current or Ex-Partner: No    Emotionally Abused: No    Physically Abused: No    Sexually Abused: No  She is a retired Runner, broadcasting/film/video  FAMILY HISTORY: Family History  Problem Relation Age of Onset   Colon cancer Mother 22   Breast cancer Mother 2       BRCA2+   Hypertension Father    Stroke Father    Alzheimer's disease Father    Prostate cancer Father    Breast cancer Maternal Aunt        mat great aunt   Breast cancer Maternal Grandmother 10   Pancreatic cancer Maternal Grandfather 40    ALLERGIES:  has no known allergies.  MEDICATIONS:  Current Outpatient Medications  Medication Sig Dispense Refill   acetaminophen  (TYLENOL ) 500 MG tablet Take 2 tablets (1,000 mg total) by mouth every 6 (six) hours as needed for mild pain. 30 tablet 0   CALCIUM PO Take by mouth daily.     cetirizine (ZYRTEC) 10 MG tablet Take 10 mg by mouth daily.     Cholecalciferol (VITAMIN D3 PO) Take by mouth daily.     lisinopril -hydrochlorothiazide  (ZESTORETIC ) 20-25 MG tablet Take 1 tablet by mouth daily. 90 tablet 3    multivitamin-iron-minerals-folic acid (CENTRUM) chewable tablet Chew 1 tablet by mouth daily.     exemestane  (AROMASIN ) 25 MG tablet Take 1 tablet (25 mg total) by mouth daily. 90 tablet 1   No current facility-administered medications for this visit.     PHYSICAL EXAMINATION: ECOG PERFORMANCE STATUS: 0 - Asymptomatic Vitals:   09/24/23 1328 09/24/23 1339  BP: (!) 160/100 (!) 155/95  Pulse: 70   Resp: 18   Temp: (!)  97 F (36.1 C)   SpO2: 99%    Filed Weights   09/24/23 1328  Weight: 149 lb (67.6 kg)    Physical Exam Constitutional:      General: She is not in acute distress. HENT:     Head: Normocephalic and atraumatic.   Eyes:     General: No scleral icterus.    Pupils: Pupils are equal, round, and reactive to light.    Cardiovascular:     Rate and Rhythm: Normal rate and regular rhythm.     Heart sounds: Normal heart sounds.  Pulmonary:     Effort: Pulmonary effort is normal. No respiratory distress.     Breath sounds: No wheezing.  Abdominal:     General: Bowel sounds are normal. There is no distension.     Palpations: Abdomen is soft. There is no mass.     Tenderness: There is no abdominal tenderness.   Musculoskeletal:        General: No deformity. Normal range of motion.     Cervical back: Normal range of motion and neck supple.   Skin:    General: Skin is warm and dry.     Findings: No erythema or rash.   Neurological:     Mental Status: She is alert and oriented to person, place, and time. Mental status is at baseline.     Cranial Nerves: No cranial nerve deficit.     Coordination: Coordination normal.   Psychiatric:        Mood and Affect: Mood normal.     LABORATORY DATA:  I have reviewed the data as listed Lab Results  Component Value Date   WBC 7.8 09/24/2023   HGB 16.1 (H) 09/24/2023   HCT 46.7 (H) 09/24/2023   MCV 94.0 09/24/2023   PLT 294 09/24/2023   Recent Labs    01/14/23 1057 03/26/23 1321 09/24/23 1320  NA 137 133*  130*  K 5.0 3.9 3.5  CL 99 99 96*  CO2 21 23 23   GLUCOSE 128* 173* 164*  BUN 16 7 10   CREATININE 0.76 0.73 0.63  CALCIUM 10.6* 9.4 9.0  GFRNONAA  --  >60 >60  PROT 7.9 7.6 7.4  ALBUMIN 4.8 4.2 4.1  AST 19 23 19   ALT 14 15 19   ALKPHOS 113 70 74  BILITOT 0.2 0.5 0.8     RADIOGRAPHIC STUDIES: I have personally reviewed the radiological images as listed and agreed with the findings in the report. No results found.

## 2023-09-24 NOTE — Assessment & Plan Note (Addendum)
 status post hysterectomy with bilateral salpingo-oophorectomy.  Annual dermatology evaluation. She has a family history of pancreatic cancer.  obtain annual MRI abdomen with and without contrast -patient declines.

## 2023-09-24 NOTE — Assessment & Plan Note (Signed)
 Zometa every 6 months-proceed with Zometa today..   Repeat DEXA showed improved, currently osteopenic T score -2.3 Proceed with Zometa

## 2023-09-24 NOTE — Assessment & Plan Note (Signed)
 Likely secondary. Check BCR-ABL 1 FISH, JAK2 mutation with reflex.

## 2023-09-24 NOTE — Assessment & Plan Note (Signed)
 Stage IB pT2 pN0 M0 ER + PR+ HER2 negative right breast cancer, grade 3, +LVI,  Bilateral mastectomy and reconstruction [01/14/2019]  -BRCA2 positive Labs reviewed and discussed with patient. Arimidex --> letrozole--> Aromasin, plan total 5 years of endocrine therapy, until March 2026 Continue Aromasin.  Refills were sent to pharmacy.

## 2023-11-21 ENCOUNTER — Encounter: Payer: Self-pay | Admitting: Student

## 2023-11-21 ENCOUNTER — Ambulatory Visit: Admitting: Student

## 2023-11-21 VITALS — BP 136/78 | HR 87 | Ht 64.0 in | Wt 146.4 lb

## 2023-11-21 DIAGNOSIS — M79605 Pain in left leg: Secondary | ICD-10-CM | POA: Diagnosis not present

## 2023-11-21 NOTE — Progress Notes (Unsigned)
 Established Patient Office Visit  Subjective   Patient ID: Cheryl Mooney, female    DOB: 07-31-1965  Age: 58 y.o. MRN: [id number]  Chief Complaint  Patient presents with   Leg Pain    Patient is here today because she is having leg pain located in her left calf muscle for about 3 weeks, describes pain as a burning sensation, sometimes radiates to knee    MEKESHA Mooney is a 58 y.o. person who presents today for the left leg pain for the past 3 weeks. Was walking quickly in the grass and felt something hip the side of her right calf, but did not see anything hit her leg. Feels like a burning sensation of the lateral calf, did not notice any bruising,  cut abraision, redness, or swelling,  Walking makes the pain worse  Resting does not  Massage and tylenol  without much improvement.  Overwall pain is improving.   {History (Optional):23778}  ROS Refer to HPI    Objective:     Outpatient Encounter Medications as of 11/21/2023  Medication Sig   acetaminophen  (TYLENOL ) 500 MG tablet Take 2 tablets (1,000 mg total) by mouth every 6 (six) hours as needed for mild pain.   CALCIUM PO Take by mouth daily.   cetirizine (ZYRTEC) 10 MG tablet Take 10 mg by mouth daily.   Cholecalciferol (VITAMIN D3 PO) Take by mouth daily.   exemestane  (AROMASIN ) 25 MG tablet Take 1 tablet (25 mg total) by mouth daily.   lisinopril -hydrochlorothiazide  (ZESTORETIC ) 20-25 MG tablet Take 1 tablet by mouth daily.   multivitamin-iron-minerals-folic acid (CENTRUM) chewable tablet Chew 1 tablet by mouth daily.   No facility-administered encounter medications on file as of 11/21/2023.    BP 136/78   Pulse 87   Ht 5' 4 (1.626 m)   Wt 146 lb 6 oz (66.4 kg)   SpO2 96%   BMI 25.13 kg/m  BP Readings from Last 3 Encounters:  11/21/23 136/78  09/24/23 (!) 165/87  09/24/23 (!) 155/95    Physical Exam     07/15/2023   10:10 AM 01/14/2023   10:09 AM 05/20/2022   10:10 AM  Depression screen PHQ 2/9   Decreased Interest 0 0 0  Down, Depressed, Hopeless 0 1 0  PHQ - 2 Score 0 1 0  Altered sleeping 2 1 0  Tired, decreased energy 0 3 0  Change in appetite 1 0 0  Feeling bad or failure about yourself  0 0 0  Trouble concentrating 0 0 0  Moving slowly or fidgety/restless 0 0 0  Suicidal thoughts 0 0 0  PHQ-9 Score 3 5 0  Difficult doing work/chores Not difficult at all Not difficult at all Not difficult at all       07/15/2023   10:11 AM 01/14/2023   10:10 AM 05/20/2022   10:10 AM 01/11/2022   10:11 AM  GAD 7 : Generalized Anxiety Score  Nervous, Anxious, on Edge 1 1 0 0  Control/stop worrying 0 1 0 0  Worry too much - different things 0 1 0 0  Trouble relaxing 1 0 0 0  Restless 0 0 0 0  Easily annoyed or irritable 1 0 0 1  Afraid - awful might happen 0 0 0 0  Total GAD 7 Score 3 3 0 1  Anxiety Difficulty Not difficult at all Not difficult at all Not difficult at all Not difficult at all    No results found for any visits on  11/21/23.  {Labs (Optional):23779}  The 10-year ASCVD risk score (Arnett DK, et al., 2019) is: 12.8%    Assessment & Plan:  There are no diagnoses linked to this encounter.   No follow-ups on file.    Harlene Saddler, MD

## 2023-11-24 DIAGNOSIS — M79605 Pain in left leg: Secondary | ICD-10-CM | POA: Insufficient documentation

## 2024-01-16 ENCOUNTER — Encounter: Admitting: Internal Medicine

## 2024-01-19 ENCOUNTER — Telehealth: Payer: Self-pay

## 2024-01-19 NOTE — Telephone Encounter (Signed)
Please call pt to schedule physical.

## 2024-01-19 NOTE — Telephone Encounter (Signed)
 Copied from CRM [id number]. Topic: Appointments - Scheduling Inquiry for Clinic >> Jan 19, 2024  4:24 PM Anairis L wrote: Reason for CRM: Please give patient a call to reschedule physical.Unable to pull up schedule. [phone]

## 2024-01-21 ENCOUNTER — Encounter: Admitting: Internal Medicine

## 2024-02-06 ENCOUNTER — Encounter: Payer: Self-pay | Admitting: Oncology

## 2024-02-06 NOTE — Telephone Encounter (Signed)
 Encounter opened in error.

## 2024-02-19 ENCOUNTER — Ambulatory Visit (INDEPENDENT_AMBULATORY_CARE_PROVIDER_SITE_OTHER): Admitting: Internal Medicine

## 2024-02-19 ENCOUNTER — Encounter: Payer: Self-pay | Admitting: Internal Medicine

## 2024-02-19 VITALS — BP 122/70 | HR 77 | Ht 64.0 in | Wt 147.0 lb

## 2024-02-19 DIAGNOSIS — R7303 Prediabetes: Secondary | ICD-10-CM | POA: Diagnosis not present

## 2024-02-19 DIAGNOSIS — I10 Essential (primary) hypertension: Secondary | ICD-10-CM

## 2024-02-19 DIAGNOSIS — E782 Mixed hyperlipidemia: Secondary | ICD-10-CM

## 2024-02-19 DIAGNOSIS — Z0000 Encounter for general adult medical examination without abnormal findings: Secondary | ICD-10-CM | POA: Diagnosis not present

## 2024-02-19 DIAGNOSIS — D126 Benign neoplasm of colon, unspecified: Secondary | ICD-10-CM | POA: Diagnosis not present

## 2024-02-19 DIAGNOSIS — C50511 Malignant neoplasm of lower-outer quadrant of right female breast: Secondary | ICD-10-CM

## 2024-02-19 DIAGNOSIS — R1110 Vomiting, unspecified: Secondary | ICD-10-CM

## 2024-02-19 DIAGNOSIS — Z170 Estrogen receptor positive status [ER+]: Secondary | ICD-10-CM

## 2024-02-19 MED ORDER — LISINOPRIL-HYDROCHLOROTHIAZIDE 20-25 MG PO TABS: 1.0000 | ORAL_TABLET | Freq: Every day | ORAL | 1 refills | Status: AC

## 2024-02-19 NOTE — Assessment & Plan Note (Signed)
 Being followed by Oncology; on Aromasin  until 2026 to complete 5 yrs of endocrine therapy.

## 2024-02-19 NOTE — Patient Instructions (Signed)
 Cheryl Mooney,  We hope you enjoyed your visit with our office! Your feedback means so much to our team, and it helps us  to continue providing the best care possible. If you had a positive experience, we'd love if you could share it by leaving us  a [employer] Review and also completing our patient survey that you'll receive soon.  Your kind words not only brighten our day but also help other patients feel confident in choosing our office for their care.  Thank you for being a part of our practice family!   Dr. Leita Adie & Eual Grieves, CMA, [facility]  Primary Care & Sports Medicine [facility] [address]  [place] [address] Office [phone]  Fax: [phone]'

## 2024-02-19 NOTE — Assessment & Plan Note (Addendum)
 Continues to have mild intermittent symptoms EGD unrevealing. Recommend small bites, chewing thoroughly and eating slowly.

## 2024-02-19 NOTE — Assessment & Plan Note (Signed)
 Well controlled blood pressure today. Current regimen is lisinopril  hydrochlorothiazide .. No medication side effects noted.

## 2024-02-19 NOTE — Assessment & Plan Note (Signed)
 Continue healthy diet and exercise.

## 2024-02-19 NOTE — Progress Notes (Signed)
 Date:  02/19/2024   Name:  Cheryl Mooney   DOB:  07/17/1965   MRN:  [id number]   Chief Complaint: Annual Exam Cheryl Mooney Cheryl Mooney is a 58 y.o. female who presents today for her Complete Annual Exam. She feels well. She reports exercising. She reports she is sleeping poorly. Breast complaints - none.  Health Maintenance  Topic Date Due   HIV Screening  Never done   DTaP/Tdap/Td vaccine (2 - Td or Tdap) 09/19/2021   COVID-19 Vaccine (3 - Moderna risk series) 03/06/2024*   Flu Shot  07/06/2024*   Colon Cancer Screening  07/11/2024   Pneumococcal Vaccine for age over 99  Completed   Hepatitis C Screening  Completed   Zoster (Shingles) Vaccine  Completed   Hepatitis B Vaccine  Aged Out   HPV Vaccine  Aged Out   Meningitis B Vaccine  Aged Out   Breast Cancer Screening  Discontinued  *Topic was postponed. The date shown is not the original due date.   Hypertension This is a chronic problem. The problem is controlled. Pertinent negatives include no chest pain, headaches, palpitations or shortness of breath. Past treatments include ACE inhibitors and diuretics. The current treatment provides significant improvement. There is no history of kidney disease, CAD/MI or CVA.    Review of Systems  Constitutional:  Negative for appetite change, chills, fatigue, fever and unexpected weight change.  HENT:  Positive for trouble swallowing (occasional). Negative for congestion, hearing loss, tinnitus and voice change.   Eyes:  Negative for visual disturbance.  Respiratory:  Negative for cough, chest tightness, shortness of breath and wheezing.   Cardiovascular:  Negative for chest pain, palpitations and leg swelling.  Gastrointestinal:  Negative for abdominal pain, constipation, diarrhea and vomiting.  Endocrine: Negative for polydipsia and polyuria.  Genitourinary:  Negative for dysuria, frequency, genital sores, hematuria, vaginal bleeding and vaginal discharge.  Musculoskeletal:  Negative for  arthralgias, gait problem, joint swelling and myalgias.  Skin:  Negative for color change and rash.  Neurological:  Negative for dizziness, tremors, weakness, light-headedness, numbness and headaches.  Hematological:  Negative for adenopathy. Does not bruise/bleed easily.  Psychiatric/Behavioral:  Negative for dysphoric mood and sleep disturbance. The patient is not nervous/anxious.      Lab Results  Component Value Date   NA 130 (L) 09/24/2023   K 3.5 09/24/2023   CO2 23 09/24/2023   GLUCOSE 164 (H) 09/24/2023   BUN 10 09/24/2023   CREATININE 0.63 09/24/2023   CALCIUM 9.0 09/24/2023   EGFR 90 01/14/2023   GFRNONAA >60 09/24/2023   Lab Results  Component Value Date   CHOL 267 (H) 01/14/2023   HDL 55 01/14/2023   LDLCALC 188 (H) 01/14/2023   TRIG 134 01/14/2023   CHOLHDL 4.9 (H) 01/14/2023   Lab Results  Component Value Date   TSH 1.390 01/14/2023   Lab Results  Component Value Date   HGBA1C 6.2 (A) 07/15/2023   Lab Results  Component Value Date   WBC 7.8 09/24/2023   HGB 16.1 (H) 09/24/2023   HCT 46.7 (H) 09/24/2023   MCV 94.0 09/24/2023   PLT 294 09/24/2023   Lab Results  Component Value Date   ALT 19 09/24/2023   AST 19 09/24/2023   ALKPHOS 74 09/24/2023   BILITOT 0.8 09/24/2023   Lab Results  Component Value Date   VD25OH 35.4 07/10/2021     Patient Active Problem List   Diagnosis Date Noted   Erythrocytosis 09/24/2023   Regurgitation  of food 01/14/2023   Chemotherapy-induced neuropathy 09/24/2022   S/P total hysterectomy and bilateral salpingo-oophorectomy 01/09/2021   Osteopenia 01/09/2021   Vitamin D deficiency 11/26/2019   Prediabetes 11/26/2019   S/P breast reconstruction, bilateral 11/07/2019   BRCA2 gene mutation positive in female 12/30/2018   Malignant neoplasm of lower-outer quadrant of right breast of female, estrogen receptor positive (HCC) 12/17/2018   Hyperlipidemia, mixed 11/19/2016   Tubular adenoma of colon 02/21/2015   Tobacco  use disorder 11/27/2014   Essential hypertension 11/27/2014    No Known Allergies  Past Surgical History:  Procedure Laterality Date   AXILLARY SENTINEL NODE BIOPSY Bilateral 01/14/2019   Procedure: AXILLARY SENTINEL NODE BIOPSY;  Surgeon: Rodolph Romano, MD;  Location: [facility];  Service: General;  Laterality: Bilateral;   BREAST BIOPSY Right 12/08/2018   us  bx of mass with calcs path pending   BREAST BIOPSY Right 12/08/2018   us  bx of LN, path pending   BREAST RECONSTRUCTION WITH PLACEMENT OF TISSUE EXPANDER AND ALLODERM Bilateral 01/14/2019   Procedure: BILATERAL BREAST RECONSTRUCTION WITH PLACEMENT OF TISSUE EXPANDER AND FLEX HD;  Surgeon: Lowery Estefana RAMAN, DO;  Location: [facility];  Service: Plastics;  Laterality: Bilateral;   COLONOSCOPY WITH PROPOFOL  N/A 02/17/2015   Procedure: COLONOSCOPY WITH PROPOFOL ;  Surgeon: Rogelia Copping, MD;  Location: [facility];  Service: Endoscopy;  Laterality: N/A;   COLONOSCOPY WITH PROPOFOL  N/A 07/12/2019   Procedure: COLONOSCOPY WITH BIOPSY;  Surgeon: Copping Rogelia, MD;  Location: [facility];  Service: Endoscopy;  Laterality: N/A;  priority 3 PORT A CATH NEEDS ACCESSED   COSMETIC SURGERY  108/2020   Tissue expanders after masectomy   CYSTOSCOPY N/A 08/05/2019   Procedure: CYSTOSCOPY;  Surgeon: Victor Claudell SAUNDERS, MD;  Location: [facility];  Service: Gynecology;  Laterality: N/A;   ESOPHAGOGASTRODUODENOSCOPY (EGD) WITH PROPOFOL  N/A 09/05/2022   Procedure: ESOPHAGOGASTRODUODENOSCOPY (EGD) WITH PROPOFOL  polypectomy;  Surgeon: Copping Rogelia, MD;  Location: [facility];  Service: Endoscopy;  Laterality: N/A;   LIPOSUCTION WITH LIPOFILLING Bilateral 03/16/2020   Procedure: Bilateral Lipofilling of breasts for symmetry;  Surgeon: Lowery Estefana RAMAN, DO;  Location: [facility];  Service: Plastics;  Laterality: Bilateral;  1.5 hours   POLYPECTOMY  02/17/2015   Procedure: POLYPECTOMY;  Surgeon: Rogelia Copping,  MD;  Location: [facility];  Service: Endoscopy;;   POLYPECTOMY N/A 07/12/2019   Procedure: POLYPECTOMY;  Surgeon: Copping Rogelia, MD;  Location: [facility];  Service: Endoscopy;  Laterality: N/A;   PORTA CATH REMOVAL Left 10/13/2019   Procedure: PORTA CATH REMOVAL;  Surgeon: Lowery Estefana RAMAN, DO;  Location: [facility];  Service: Plastics;  Laterality: Left;   PORTACATH PLACEMENT Left 02/18/2019   Procedure: INSERTION PORT-A-CATH;  Surgeon: Rodolph Romano, MD;  Location: [facility];  Service: General;  Laterality: Left;   REMOVAL OF BILATERAL TISSUE EXPANDERS WITH PLACEMENT OF BILATERAL BREAST IMPLANTS Bilateral 10/13/2019   Procedure: REMOVAL OF BILATERAL TISSUE EXPANDERS WITH PLACEMENT OF BILATERAL BREAST IMPLANTS;  Surgeon: Lowery Estefana RAMAN, DO;  Location: [facility];  Service: Plastics;  Laterality: Bilateral;  2 hours please   SIMPLE MASTECTOMY WITH AXILLARY SENTINEL NODE BIOPSY Left 01/14/2019   Procedure: LEFT SIMPLE MASTECTOMY;  Surgeon: Rodolph Romano, MD;  Location: [facility];  Service: General;  Laterality: Left;   TOTAL LAPAROSCOPIC HYSTERECTOMY WITH BILATERAL SALPINGO OOPHORECTOMY Bilateral 08/05/2019   Procedure: TOTAL LAPAROSCOPIC HYSTERECTOMY WITH BILATERAL SALPINGO OOPHORECTOMY;  Surgeon: Victor Claudell SAUNDERS, MD;  Location: [facility];  Service: Gynecology;  Laterality: Bilateral;   TOTAL MASTECTOMY Right 01/14/2019   Procedure: RIGHT TOTAL MASTECTOMY;  Surgeon: Rodolph Romano, MD;  Location: [facility];  Service: General;  Laterality: Right;   WISDOM TOOTH EXTRACTION      Social History   Tobacco Use   Smoking status: Every Day    Current packs/day: 0.25    Average packs/day: 0.3 packs/day for 35.9 years (9.0 ttl pk-yrs)    Types: Cigarettes    Start date: 52   Smokeless tobacco: Never  Vaping Use   Vaping status: Never Used  Substance Use Topics   Alcohol use: Yes    Alcohol/week: 2.0 standard  drinks of alcohol    Types: 2 Standard drinks or equivalent per week    Comment: occasional   Drug use: No     Medication list has been reviewed and updated.  Current Meds  Medication Sig   acetaminophen  (TYLENOL ) 500 MG tablet Take 2 tablets (1,000 mg total) by mouth every 6 (six) hours as needed for mild pain.   CALCIUM PO Take by mouth daily.   cetirizine (ZYRTEC) 10 MG tablet Take 10 mg by mouth daily.   Cholecalciferol (VITAMIN D3 PO) Take by mouth daily.   exemestane  (AROMASIN ) 25 MG tablet Take 1 tablet (25 mg total) by mouth daily.   multivitamin-iron-minerals-folic acid (CENTRUM) chewable tablet Chew 1 tablet by mouth daily.   [DISCONTINUED] lisinopril -hydrochlorothiazide  (ZESTORETIC ) 20-25 MG tablet Take 1 tablet by mouth daily.       02/19/2024    9:19 AM 07/15/2023   10:11 AM 01/14/2023   10:10 AM 05/20/2022   10:10 AM  GAD 7 : Generalized Anxiety Score  Nervous, Anxious, on Edge 1 1 1  0  Control/stop worrying 0 0 1 0  Worry too much - different things 0 0 1 0  Trouble relaxing 1 1 0 0  Restless 0 0 0 0  Easily annoyed or irritable 1 1 0 0  Afraid - awful might happen 0 0 0 0  Total GAD 7 Score 3 3 3  0  Anxiety Difficulty Not difficult at all Not difficult at all Not difficult at all Not difficult at all       02/19/2024    9:19 AM 07/15/2023   10:10 AM 01/14/2023   10:09 AM  Depression screen PHQ 2/9  Decreased Interest 0 0 0  Down, Depressed, Hopeless 0 0 1  PHQ - 2 Score 0 0 1  Altered sleeping 2 2 1   Tired, decreased energy 0 0 3  Change in appetite 1 1 0  Feeling bad or failure about yourself  0 0 0  Trouble concentrating 0 0 0  Moving slowly or fidgety/restless 0 0 0  Suicidal thoughts 0 0 0  PHQ-9 Score 3 3  5    Difficult doing work/chores Not difficult at all Not difficult at all Not difficult at all     Data saved with a previous flowsheet row definition    BP Readings from Last 3 Encounters:  02/19/24 122/70  11/21/23 136/78  09/24/23 (!)  165/87    Physical Exam Vitals and nursing note reviewed.  Constitutional:      General: She is not in acute distress.    Appearance: She is well-developed.  HENT:     Head: Normocephalic and atraumatic.     Right Ear: Tympanic membrane and ear canal normal.     Left Ear: Tympanic membrane and ear canal normal.     Nose:  Right Sinus: No maxillary sinus tenderness.     Left Sinus: No maxillary sinus tenderness.  Eyes:     General: No scleral icterus.       Right eye: No discharge.        Left eye: No discharge.     Conjunctiva/sclera: Conjunctivae normal.  Neck:     Thyroid: No thyromegaly.     Vascular: No carotid bruit.  Cardiovascular:     Rate and Rhythm: Normal rate and regular rhythm.     Pulses: Normal pulses.     Heart sounds: Normal heart sounds.  Pulmonary:     Effort: Pulmonary effort is normal. No respiratory distress.     Breath sounds: No wheezing.  Abdominal:     General: Bowel sounds are normal.     Palpations: Abdomen is soft.     Tenderness: There is no abdominal tenderness.  Musculoskeletal:     Cervical back: Normal range of motion. No erythema.     Right lower leg: No edema.     Left lower leg: No edema.  Lymphadenopathy:     Cervical: No cervical adenopathy.  Skin:    General: Skin is warm and dry.     Findings: No rash.  Neurological:     Mental Status: She is alert and oriented to person, place, and time.     Cranial Nerves: No cranial nerve deficit.     Sensory: No sensory deficit.     Deep Tendon Reflexes: Reflexes are normal and symmetric.  Psychiatric:        Attention and Perception: Attention normal.        Mood and Affect: Mood normal.     Wt Readings from Last 3 Encounters:  02/19/24 147 lb (66.7 kg)  11/21/23 146 lb 6 oz (66.4 kg)  09/24/23 149 lb (67.6 kg)    BP 122/70   Pulse 77   Ht 5' 4 (1.626 m)   Wt 147 lb (66.7 kg)   SpO2 97%   BMI 25.23 kg/m   Assessment and Plan:  Problem List Items Addressed This  Visit       Unprioritized   Essential hypertension (Chronic)   Well controlled blood pressure today. Current regimen is lisinopril  hydrochlorothiazide .. No medication side effects noted.        Relevant Medications   lisinopril -hydrochlorothiazide  (ZESTORETIC ) 20-25 MG tablet   Other Relevant Orders   TSH   Urinalysis, Routine w reflex microscopic   Tubular adenoma of colon   Due for repeat colonoscopy next April. Referral placed.      Relevant Orders   Ambulatory referral to Gastroenterology   Hyperlipidemia, mixed (Chronic)   Managed with diet only. Lab Results  Component Value Date   LDLCALC 188 (H) 01/14/2023         Relevant Medications   lisinopril -hydrochlorothiazide  (ZESTORETIC ) 20-25 MG tablet   Other Relevant Orders   Lipid panel   Malignant neoplasm of lower-outer quadrant of right breast of female, estrogen receptor positive (HCC) (Chronic)   Being followed by Oncology; on Aromasin  until 2026 to complete 5 yrs of endocrine therapy.      Prediabetes (Chronic)   Continue healthy diet and exercise.      Relevant Orders   Hemoglobin A1c   Regurgitation of food   Continues to have mild intermittent symptoms EGD unrevealing. Recommend small bites, chewing thoroughly and eating slowly.      Other Visit Diagnoses       Annual physical exam    -  Primary   continue healthy diet and regular exercise up to date on screenings and immunizations       Return in about 6 months (around 08/18/2024) for HTN Dr Lemon.    Cheryl HILARIO Adie, MD [facility] [place]

## 2024-02-19 NOTE — Assessment & Plan Note (Signed)
 Managed with diet only. Lab Results  Component Value Date   LDLCALC 188 (H) 01/14/2023

## 2024-02-19 NOTE — Assessment & Plan Note (Signed)
 Due for repeat colonoscopy next April. Referral placed.

## 2024-02-20 ENCOUNTER — Ambulatory Visit: Payer: Self-pay | Admitting: Internal Medicine

## 2024-02-20 LAB — LIPID PANEL
Chol/HDL Ratio: 4.8 ratio — ABNORMAL HIGH (ref 0.0–4.4)
Cholesterol, Total: 247 mg/dL — ABNORMAL HIGH (ref 100–199)
HDL: 51 mg/dL (ref >39)
LDL Chol Calc (NIH): 167 mg/dL — ABNORMAL HIGH (ref 0–99)
Triglycerides: 158 mg/dL — ABNORMAL HIGH (ref 0–149)
VLDL Cholesterol Cal: 29 mg/dL (ref 5–40)

## 2024-02-20 LAB — URINALYSIS, ROUTINE W REFLEX MICROSCOPIC
Bilirubin, UA: NEGATIVE
Glucose, UA: NEGATIVE
Ketones, UA: NEGATIVE
Leukocytes,UA: NEGATIVE
Nitrite, UA: NEGATIVE
Protein,UA: NEGATIVE
RBC, UA: NEGATIVE
Specific Gravity, UA: 1.013 (ref 1.005–1.030)
Urobilinogen, Ur: 0.2 mg/dL (ref 0.2–1.0)
pH, UA: 6 (ref 5.0–7.5)

## 2024-02-20 LAB — HEMOGLOBIN A1C
Est. average glucose Bld gHb Est-mCnc: 131 mg/dL
Hgb A1c MFr Bld: 6.2 % — ABNORMAL HIGH (ref 4.8–5.6)

## 2024-02-20 LAB — TSH: TSH: 1.5 u[IU]/mL (ref 0.450–4.500)

## 2024-03-09 ENCOUNTER — Telehealth: Payer: Self-pay

## 2024-03-09 ENCOUNTER — Other Ambulatory Visit: Payer: Self-pay

## 2024-03-09 DIAGNOSIS — Z860100 Personal history of colon polyps, unspecified: Secondary | ICD-10-CM

## 2024-03-09 MED ORDER — NA SULFATE-K SULFATE-MG SULF 17.5-3.13-1.6 GM/177ML PO SOLN: 1.0000 | Freq: Once | ORAL | 0 refills | Status: AC

## 2024-03-09 NOTE — Telephone Encounter (Signed)
 Gastroenterology Pre-Procedure Review  Request Date: 07/16/24 Requesting Physician: Dr. Melany  PATIENT REVIEW QUESTIONS: The patient responded to the following health history questions as indicated:    1. Are you having any GI issues? no 2. Do you have a personal history of Polyps? yes (last colonoscopy performed by Dr. Jinny 07/12/19 recommended repeat in 5 years) 3. Do you have a family history of Colon Cancer or Polyps? no 4. Diabetes Mellitus? no 5. Joint replacements in the past 12 months?no 6. Major health problems in the past 3 months?no 7. Any artificial heart valves, MVP, or defibrillator?no    MEDICATIONS & ALLERGIES:    Patient reports the following regarding taking any anticoagulation/antiplatelet therapy:   Plavix, Coumadin, Eliquis, Xarelto, Lovenox , Pradaxa, Brilinta, or Effient? no Aspirin? no  Patient confirms/reports the following medications:  Current Outpatient Medications  Medication Sig Dispense Refill   acetaminophen  (TYLENOL ) 500 MG tablet Take 2 tablets (1,000 mg total) by mouth every 6 (six) hours as needed for mild pain. 30 tablet 0   CALCIUM PO Take by mouth daily.     cetirizine (ZYRTEC) 10 MG tablet Take 10 mg by mouth daily.     Cholecalciferol (VITAMIN D3 PO) Take by mouth daily.     exemestane  (AROMASIN ) 25 MG tablet Take 1 tablet (25 mg total) by mouth daily. 90 tablet 1   lisinopril -hydrochlorothiazide  (ZESTORETIC ) 20-25 MG tablet Take 1 tablet by mouth daily. 90 tablet 1   multivitamin-iron-minerals-folic acid (CENTRUM) chewable tablet Chew 1 tablet by mouth daily.     No current facility-administered medications for this visit.    Patient confirms/reports the following allergies:  No Known Allergies  No orders of the defined types were placed in this encounter.   AUTHORIZATION INFORMATION Primary Insurance: 1D#: Group #:  Secondary Insurance: 1D#: Group #:  SCHEDULE INFORMATION: Date: 07/16/24 Time: Location: [facility]

## 2024-03-24 ENCOUNTER — Inpatient Hospital Stay: Attending: Oncology

## 2024-03-24 ENCOUNTER — Inpatient Hospital Stay

## 2024-03-24 ENCOUNTER — Encounter: Payer: Self-pay | Admitting: Oncology

## 2024-03-24 ENCOUNTER — Ambulatory Visit: Admitting: Oncology

## 2024-03-24 VITALS — BP 142/85 | HR 71 | Temp 97.9°F | Resp 18 | Wt 150.4 lb

## 2024-03-24 DIAGNOSIS — M8580 Other specified disorders of bone density and structure, unspecified site: Secondary | ICD-10-CM

## 2024-03-24 DIAGNOSIS — F17210 Nicotine dependence, cigarettes, uncomplicated: Secondary | ICD-10-CM | POA: Insufficient documentation

## 2024-03-24 DIAGNOSIS — Z15068 Genetic susceptibility to other malignant neoplasm of digestive system: Secondary | ICD-10-CM | POA: Diagnosis not present

## 2024-03-24 DIAGNOSIS — Z170 Estrogen receptor positive status [ER+]: Secondary | ICD-10-CM

## 2024-03-24 DIAGNOSIS — Z1721 Progesterone receptor positive status: Secondary | ICD-10-CM | POA: Diagnosis not present

## 2024-03-24 DIAGNOSIS — Z90710 Acquired absence of both cervix and uterus: Secondary | ICD-10-CM | POA: Insufficient documentation

## 2024-03-24 DIAGNOSIS — Z1732 Human epidermal growth factor receptor 2 negative status: Secondary | ICD-10-CM | POA: Insufficient documentation

## 2024-03-24 DIAGNOSIS — Z1509 Genetic susceptibility to other malignant neoplasm: Secondary | ICD-10-CM

## 2024-03-24 DIAGNOSIS — Z1507 Genetic susceptibility to malignant neoplasm of urinary tract: Secondary | ICD-10-CM

## 2024-03-24 DIAGNOSIS — Z803 Family history of malignant neoplasm of breast: Secondary | ICD-10-CM | POA: Insufficient documentation

## 2024-03-24 DIAGNOSIS — Z1501 Genetic susceptibility to malignant neoplasm of breast: Secondary | ICD-10-CM | POA: Insufficient documentation

## 2024-03-24 DIAGNOSIS — Z1589 Genetic susceptibility to other disease: Secondary | ICD-10-CM | POA: Diagnosis not present

## 2024-03-24 DIAGNOSIS — C50511 Malignant neoplasm of lower-outer quadrant of right female breast: Secondary | ICD-10-CM | POA: Insufficient documentation

## 2024-03-24 DIAGNOSIS — Z800 Family history of malignant neoplasm of digestive organs: Secondary | ICD-10-CM | POA: Insufficient documentation

## 2024-03-24 DIAGNOSIS — Z923 Personal history of irradiation: Secondary | ICD-10-CM | POA: Diagnosis not present

## 2024-03-24 DIAGNOSIS — D751 Secondary polycythemia: Secondary | ICD-10-CM | POA: Diagnosis not present

## 2024-03-24 DIAGNOSIS — Z9013 Acquired absence of bilateral breasts and nipples: Secondary | ICD-10-CM | POA: Diagnosis not present

## 2024-03-24 DIAGNOSIS — Z79811 Long term (current) use of aromatase inhibitors: Secondary | ICD-10-CM | POA: Insufficient documentation

## 2024-03-24 DIAGNOSIS — Z1502 Genetic susceptibility to malignant neoplasm of ovary: Secondary | ICD-10-CM

## 2024-03-24 DIAGNOSIS — M85852 Other specified disorders of bone density and structure, left thigh: Secondary | ICD-10-CM

## 2024-03-24 DIAGNOSIS — Z1505 Genetic susceptibility to malignant neoplasm of fallopian tube(s): Secondary | ICD-10-CM

## 2024-03-24 LAB — CMP (CANCER CENTER ONLY)
ALT: 14 U/L (ref 0–44)
AST: 21 U/L (ref 15–41)
Albumin: 4.4 g/dL (ref 3.5–5.0)
Alkaline Phosphatase: 85 U/L (ref 38–126)
Anion gap: 11 (ref 5–15)
BUN: 9 mg/dL (ref 6–20)
CO2: 25 mmol/L (ref 22–32)
Calcium: 10 mg/dL (ref 8.9–10.3)
Chloride: 98 mmol/L (ref 98–111)
Creatinine: 0.66 mg/dL (ref 0.44–1.00)
GFR, Estimated: >60 mL/min (ref >60)
Glucose, Bld: 115 mg/dL — ABNORMAL HIGH (ref 70–99)
Potassium: 4 mmol/L (ref 3.5–5.1)
Sodium: 134 mmol/L — ABNORMAL LOW (ref 135–145)
Total Bilirubin: 0.3 mg/dL (ref 0.0–1.2)
Total Protein: 7.2 g/dL (ref 6.5–8.1)

## 2024-03-24 LAB — CBC WITH DIFFERENTIAL (CANCER CENTER ONLY)
Abs Immature Granulocytes: 0.03 K/uL (ref 0.00–0.07)
Basophils Absolute: 0.1 K/uL (ref 0.0–0.1)
Basophils Relative: 1 %
Eosinophils Absolute: 0.2 K/uL (ref 0.0–0.5)
Eosinophils Relative: 2 %
HCT: 45.3 % (ref 36.0–46.0)
Hemoglobin: 15.5 g/dL — ABNORMAL HIGH (ref 12.0–15.0)
Immature Granulocytes: 0 %
Lymphocytes Relative: 36 %
Lymphs Abs: 2.8 K/uL (ref 0.7–4.0)
MCH: 32.2 pg (ref 26.0–34.0)
MCHC: 34.2 g/dL (ref 30.0–36.0)
MCV: 94 fL (ref 80.0–100.0)
Monocytes Absolute: 0.5 K/uL (ref 0.1–1.0)
Monocytes Relative: 7 %
Neutro Abs: 4.3 K/uL (ref 1.7–7.7)
Neutrophils Relative %: 54 %
Platelet Count: 257 K/uL (ref 150–400)
RBC: 4.82 MIL/uL (ref 3.87–5.11)
RDW: 12.7 % (ref 11.5–15.5)
WBC Count: 7.9 K/uL (ref 4.0–10.5)
nRBC: 0 % (ref 0.0–0.2)

## 2024-03-24 MED ORDER — ZOLEDRONIC ACID 4 MG/100ML IV SOLN
4.0000 mg | Freq: Once | INTRAVENOUS | Status: AC
  Administered 2024-03-24: 15:00:00 4 mg via INTRAVENOUS
  Filled 2024-03-24: qty 100

## 2024-03-24 MED ORDER — EXEMESTANE 25 MG PO TABS: 25.0000 mg | ORAL_TABLET | Freq: Every day | ORAL | 0 refills | Status: AC

## 2024-03-24 MED ORDER — SODIUM CHLORIDE 0.9 % IV SOLN
Freq: Once | INTRAVENOUS | Status: AC
  Filled 2024-03-24: qty 250

## 2024-03-24 MED ORDER — SODIUM CHLORIDE 0.9% FLUSH
10.0000 mL | Freq: Once | INTRAVENOUS | Status: AC | PRN
  Administered 2024-03-24: 15:00:00 10 mL
  Filled 2024-03-24: qty 10

## 2024-03-24 NOTE — Assessment & Plan Note (Addendum)
 status post hysterectomy with bilateral salpingo-oophorectomy.  Annual dermatology evaluation. She has a family history of pancreatic cancer.  Previously declined annual MRI abdomen with and without contrast

## 2024-03-24 NOTE — Assessment & Plan Note (Signed)
 Likely secondary. Check BCR-ABL 1 FISH, JAK2 mutation with reflex.

## 2024-03-24 NOTE — Assessment & Plan Note (Addendum)
 Stage IB pT2 pN0 M0 ER + PR+ HER2 negative right breast cancer, grade 3, +LVI,  Bilateral mastectomy and reconstruction [01/14/2019]  -BRCA2 positive Labs reviewed and discussed with patient. Arimidex  --> letrozole --> Aromasin , plan total 5 years of endocrine therapy, until March 2026.  Discussed about option of extended endocrine therapy, she is not interested Continue Aromasin  until end of March 2026 and stop.  Refills were sent to pharmacy.

## 2024-03-24 NOTE — Progress Notes (Signed)
 Hematology/Oncology Progress note [phone] [phone]      Patient Care Team: Justus Leita DEL, MD as PCP - General (Internal Medicine) Cathlyn Seal, MD (Dermatology) Cindie Jesusa HERO, RN as Registered Nurse Rodolph Romano, MD as Consulting Physician (General Surgery) Babara Call, MD as Consulting Physician (Oncology) Dillingham, Estefana RAMAN, DO as Attending Physician (Plastic Surgery)    ASSESSMENT & PLAN:   Cancer Staging  Malignant neoplasm of lower-outer quadrant of right breast of female, estrogen receptor positive (HCC) Staging form: Breast, AJCC 8th Edition - Clinical: Stage IIA (cT2, cN0, cM0, G3, ER+, PR+, HER2: Equivocal) - Signed by Babara Call, MD on 03/26/2022 - Pathologic stage from 01/21/2019: Stage IB (pT2, pN0, cM0, G3, ER+, PR+, HER2-) - Signed by Babara Call, MD on 01/22/2019   Malignant neoplasm of lower-outer quadrant of right breast of female, estrogen receptor positive (HCC) Stage IB pT2 pN0 M0 ER + PR+ HER2 negative right breast cancer, grade 3, +LVI,  Bilateral mastectomy and reconstruction [01/14/2019]  -BRCA2 positive Labs reviewed and discussed with patient. Arimidex  --> letrozole --> Aromasin , plan total 5 years of endocrine therapy, until March 2026.  Discussed about option of extended endocrine therapy, she is not interested Continue Aromasin  until end of March 2026 and stop.  Refills were sent to pharmacy.  BRCA2 gene mutation positive in female status post hysterectomy with bilateral salpingo-oophorectomy.  Annual dermatology evaluation. She has a family history of pancreatic cancer.  Previously declined annual MRI abdomen with and without contrast   Erythrocytosis Likely secondary. Check BCR-ABL 1 FISH, JAK2 mutation with reflex.  Osteopenia Zometa  every 6 months-proceed with Zometa  today..   Repeat DEXA showed improved, currently osteopenic T score -2.3 Proceed with Zometa   Patient Is not interested in  continue Zometa  after finishing AI.  Recommend patient to follow up with PCP and consider repeat DEXA in the future    Follow up PRN.   All questions were answered. The patient knows to call the clinic with any problems, questions or concerns.  Call Babara, MD, PhD [facility] Hematology Oncology 03/24/2024     CHIEF COMPLAINTS/REASON FOR VISIT:  Follow-up for breast cancer.  HISTORY OF PRESENTING ILLNESS:  Cheryl Mooney is a  58 y.o.  female with PMH listed below who was referred to me for evaluation of breast cancer Patient reports feeling mass of her right breast week before her annual mammogram. 12/03/2018 patient had bilateral diagnostic mammogram and ultrasound  which showed suspicious 2.1 x 1.3 x 1.6 irregular mass at 7:00 in the right breast, 2 cm from the nipple. 2 borderline lymph nodes are seen in the right axilla.  1 of the nodes demonstrate a cortex of 4.4 mm.  Both lymph nodes demonstrated retention of fatty hila. Patient underwent ultrasound-guided biopsy of right breast mass as well as ultrasound-guided biopsy of 1 of the 2 mildly abnormal right axillary lymph nodes. 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 Right axilla lymph node negative for malignancy. ER> 90% positive, PR11-50% positive, HER-2 equivocal, 2+ by IHC.  FISH negative.   Nipple discharge: Denies Family history: Mother diagnosed with breast cancer at age of 42, and colon cancer at age of 57.  Mother has BRCA2 mutation.   Maternal grandmother breast cancer, maternal great aunt breast cancer.  Patient recalls that she was tested long time ago and was not aware of any results.  OCP use: In her 20s-30s Estrogen and progesterone therapy: denies History of radiation to  chest: denies.  Previous breast surgery: Denies  BRCA 2 positive.  # 01/14/2019 patient underwent bilateral mastectomy with sentinel lymph node biopsy of left and  the right axillary. Right breast showed invasive mammary carcinoma, DCIS positive, apocrine metaplastic, stromal fibrosis, duct ectasia, simple cyst formation, usual epithelial hyperplasia.  Sentinel lymph node on the right side was negative. pT2 pN0 Left prophylactic mastectomy and a sentinel lymph node negative for malignancy.  #OncotypeDX recurrence score 31, adjuvant chemotherapy benefit more than 15%. # She follows up with plastic surgeon Dr. Lowery, she has expander, expanding process is being during chemotherapy. Mediport was placed by Dr. Cesar on 02/18/2019.  Patient had 1 cycle of AC, chemotherapy plan was switched to docetaxel  and carboplatin  after consulting expert opinion. She has finished 4 cycle of docetaxel  and carboplatin . March 2021, started on Arimidex . May 2021, patient underwent laparoscopic-assisted vaginal hysterectomy with bilateralsalpingectomy and oophorectomy 10/13/19 bilateral breast reconstruction  Porta cath removal.  03/16/20 treatment for bilateral breast asymmetry treatment.  May 2023, stopped Arimidex  and switched to letrozole  due to fatigue.   INTERVAL HISTORY Cheryl Mooney is a 58 y.o. female who has above history reviewed by me today presents for follow up visit for management of stage I breast cancer, ER PR positive, HER-2 negative, hereditary BRCA 2 mutation.  Patient takes Aromasin , tolerates with manageable side effects. No other new concerns  Review of Systems  Constitutional:  Negative for appetite change, chills and fever.  HENT:   Negative for hearing loss and voice change.   Eyes:  Negative for eye problems.  Respiratory:  Negative for chest tightness and cough.   Cardiovascular:  Negative for chest pain.  Gastrointestinal:  Negative for abdominal distention, abdominal pain, blood in stool and diarrhea.  Endocrine: Positive for hot flashes.  Genitourinary:  Negative for difficulty urinating and frequency.   Musculoskeletal:  Positive  for arthralgias.  Skin:  Negative for itching and rash.  Neurological:  Negative for extremity weakness.  Hematological:  Negative for adenopathy.  Psychiatric/Behavioral:  Negative for confusion. The patient is not nervous/anxious.     MEDICAL HISTORY:  Past Medical History:  Diagnosis Date   Anemia    during chemo /things seem back to normal   Benign neoplasm of sigmoid colon    Benign neoplasm of transverse colon    Breast cancer (HCC)    Depression    Encounter for antineoplastic chemotherapy 12/30/2018   Pathogenic variant in BRCA2 called [id number] identified on the [facility] Breast Cancer STAT Panel. The STAT Breast cancer panel offered by [facility] includes sequencing and rearrangement analysis for the following 9 genes:  ATM, BRCA1, BRCA2, CDH1, CHEK2, PALB2, PTEN, STK11 and TP53.  The report date is 12/30/2018. Common Hereditary Cancer Panel results pending.    Family history of BRCA2 gene positive    Family history of breast cancer    Family history of colon cancer    Family history of lung cancer    Family history of pancreatic cancer    GERD (gastroesophageal reflux disease)    History of kidney stones    Hx of abnormal cervical Pap smear 09/07/2011   Normal Pap and neg HPV 10/2012  Normal pap 2016, 2019     Hypertension    Malignant neoplasm of lower-outer quadrant of right breast of female, estrogen receptor positive (HCC) 12/17/2018   Nausea and vomiting 09/05/2022   Polyp of sigmoid colon    Pre-diabetes    Seasonal allergies    Skin cancer  Smoker     SURGICAL HISTORY: Past Surgical History:  Procedure Laterality Date   AXILLARY SENTINEL NODE BIOPSY Bilateral 01/14/2019   Procedure: AXILLARY SENTINEL NODE BIOPSY;  Surgeon: Rodolph Romano, MD;  Location: [facility];  Service: General;  Laterality: Bilateral;   BREAST BIOPSY Right 12/08/2018   us  bx of mass with calcs path pending   BREAST BIOPSY Right 12/08/2018   us  bx of LN, path pending   BREAST  RECONSTRUCTION WITH PLACEMENT OF TISSUE EXPANDER AND ALLODERM Bilateral 01/14/2019   Procedure: BILATERAL BREAST RECONSTRUCTION WITH PLACEMENT OF TISSUE EXPANDER AND FLEX HD;  Surgeon: Lowery Estefana RAMAN, DO;  Location: [facility];  Service: Plastics;  Laterality: Bilateral;   COLONOSCOPY WITH PROPOFOL  N/A 02/17/2015   Procedure: COLONOSCOPY WITH PROPOFOL ;  Surgeon: Rogelia Copping, MD;  Location: [facility];  Service: Endoscopy;  Laterality: N/A;   COLONOSCOPY WITH PROPOFOL  N/A 07/12/2019   Procedure: COLONOSCOPY WITH BIOPSY;  Surgeon: Copping Rogelia, MD;  Location: [facility];  Service: Endoscopy;  Laterality: N/A;  priority 3 PORT A CATH NEEDS ACCESSED   COSMETIC SURGERY  108/2020   Tissue expanders after masectomy   CYSTOSCOPY N/A 08/05/2019   Procedure: CYSTOSCOPY;  Surgeon: Victor Claudell SAUNDERS, MD;  Location: [facility];  Service: Gynecology;  Laterality: N/A;   ESOPHAGOGASTRODUODENOSCOPY (EGD) WITH PROPOFOL  N/A 09/05/2022   Procedure: ESOPHAGOGASTRODUODENOSCOPY (EGD) WITH PROPOFOL  polypectomy;  Surgeon: Copping Rogelia, MD;  Location: [facility];  Service: Endoscopy;  Laterality: N/A;   LIPOSUCTION WITH LIPOFILLING Bilateral 03/16/2020   Procedure: Bilateral Lipofilling of breasts for symmetry;  Surgeon: Lowery Estefana RAMAN, DO;  Location: [facility];  Service: Plastics;  Laterality: Bilateral;  1.5 hours   POLYPECTOMY  02/17/2015   Procedure: POLYPECTOMY;  Surgeon: Rogelia Copping, MD;  Location: [facility];  Service: Endoscopy;;   POLYPECTOMY N/A 07/12/2019   Procedure: POLYPECTOMY;  Surgeon: Copping Rogelia, MD;  Location: [facility];  Service: Endoscopy;  Laterality: N/A;   PORTA CATH REMOVAL Left 10/13/2019   Procedure: PORTA CATH REMOVAL;  Surgeon: Lowery Estefana RAMAN, DO;  Location: [facility];  Service: Plastics;  Laterality: Left;   PORTACATH PLACEMENT Left 02/18/2019   Procedure: INSERTION PORT-A-CATH;  Surgeon:  Rodolph Romano, MD;  Location: [facility];  Service: General;  Laterality: Left;   REMOVAL OF BILATERAL TISSUE EXPANDERS WITH PLACEMENT OF BILATERAL BREAST IMPLANTS Bilateral 10/13/2019   Procedure: REMOVAL OF BILATERAL TISSUE EXPANDERS WITH PLACEMENT OF BILATERAL BREAST IMPLANTS;  Surgeon: Lowery Estefana RAMAN, DO;  Location: [facility];  Service: Plastics;  Laterality: Bilateral;  2 hours please   SIMPLE MASTECTOMY WITH AXILLARY SENTINEL NODE BIOPSY Left 01/14/2019   Procedure: LEFT SIMPLE MASTECTOMY;  Surgeon: Rodolph Romano, MD;  Location: [facility];  Service: General;  Laterality: Left;   TOTAL LAPAROSCOPIC HYSTERECTOMY WITH BILATERAL SALPINGO OOPHORECTOMY Bilateral 08/05/2019   Procedure: TOTAL LAPAROSCOPIC HYSTERECTOMY WITH BILATERAL SALPINGO OOPHORECTOMY;  Surgeon: Victor Claudell SAUNDERS, MD;  Location: [facility];  Service: Gynecology;  Laterality: Bilateral;   TOTAL MASTECTOMY Right 01/14/2019   Procedure: RIGHT TOTAL MASTECTOMY;  Surgeon: Rodolph Romano, MD;  Location: [facility];  Service: General;  Laterality: Right;   WISDOM TOOTH EXTRACTION      SOCIAL HISTORY: Social History   Socioeconomic History   Marital status: Married    Spouse name: norleen   Number of children: Not on file   Years of education: Not on file   Highest education level: Not on file  Occupational History  Occupation: investment banker, corporate    Comment: retired  Tobacco Use   Smoking status: Every Day    Current packs/day: 0.25    Average packs/day: 0.3 packs/day for 36.0 years (9.0 ttl pk-yrs)    Types: Cigarettes    Start date: 31   Smokeless tobacco: Never  Vaping Use   Vaping status: Never Used  Substance and Sexual Activity   Alcohol use: Yes    Alcohol/week: 2.0 standard drinks of alcohol    Types: 2 Standard drinks or equivalent per week    Comment: occasional   Drug use: No   Sexual activity: Yes    Birth control/protection: Surgical  Other Topics Concern   Not on  file  Social History Narrative   Patient lives with husband.   Social Drivers of Health   Tobacco Use: High Risk (03/24/2024)   Patient History    Smoking Tobacco Use: Every Day    Smokeless Tobacco Use: Never    Passive Exposure: Not on file  Financial Resource Strain: Low Risk (09/14/2021)   Overall Financial Resource Strain (CARDIA)    Difficulty of Paying Living Expenses: Not hard at all  Food Insecurity: No Food Insecurity (09/14/2021)   Hunger Vital Sign    Worried About Running Out of Food in the Last Year: Never true    Ran Out of Food in the Last Year: Never true  Transportation Needs: No Transportation Needs (09/14/2021)   PRAPARE - Administrator, Civil Service (Medical): No    Lack of Transportation (Non-Medical): No  Physical Activity: Not on file  Stress: Not on file  Social Connections: Not on file  Intimate Partner Violence: Not At Risk (09/14/2021)   Humiliation, Afraid, Rape, and Kick questionnaire    Fear of Current or Ex-Partner: No    Emotionally Abused: No    Physically Abused: No    Sexually Abused: No  Depression (PHQ2-9): Low Risk (02/19/2024)   Depression (PHQ2-9)    PHQ-2 Score: 3  Alcohol Screen: Low Risk (07/10/2021)   Alcohol Screen    Last Alcohol Screening Score (AUDIT): 4  Housing: Low Risk (09/14/2021)   Housing    Last Housing Risk Score: 0  Utilities: Not on file  Health Literacy: Not on file  She is a retired runner, broadcasting/film/video  FAMILY HISTORY: Family History  Problem Relation Age of Onset   Colon cancer Mother 37   Breast cancer Mother 30       BRCA2+   Cancer Mother    Hypertension Father    Stroke Father    Alzheimer's disease Father    Prostate cancer Father    Breast cancer Maternal Aunt        mat great aunt   Breast cancer Maternal Grandmother 58   Pancreatic cancer Maternal Grandfather 44    ALLERGIES:  has no known allergies.  MEDICATIONS:  Current Outpatient Medications  Medication Sig Dispense Refill    acetaminophen  (TYLENOL ) 500 MG tablet Take 2 tablets (1,000 mg total) by mouth every 6 (six) hours as needed for mild pain. 30 tablet 0   CALCIUM PO Take by mouth daily.     cetirizine (ZYRTEC) 10 MG tablet Take 10 mg by mouth daily.     Cholecalciferol (VITAMIN D3 PO) Take by mouth daily.     lisinopril -hydrochlorothiazide  (ZESTORETIC ) 20-25 MG tablet Take 1 tablet by mouth daily. 90 tablet 1   multivitamin-iron-minerals-folic acid (CENTRUM) chewable tablet Chew 1 tablet by mouth daily.     exemestane  (  AROMASIN ) 25 MG tablet Take 1 tablet (25 mg total) by mouth daily. 90 tablet 0   No current facility-administered medications for this visit.     PHYSICAL EXAMINATION: ECOG PERFORMANCE STATUS: 0 - Asymptomatic Vitals:   03/24/24 1354 03/24/24 1404  BP: (!) 149/87 (!) 142/85  Pulse: 71   Resp: 18   Temp: 97.9 F (36.6 C)   SpO2: 99%    Filed Weights   03/24/24 1354  Weight: 150 lb 6.4 oz (68.2 kg)    Physical Exam Constitutional:      General: She is not in acute distress. HENT:     Head: Normocephalic and atraumatic.  Eyes:     General: No scleral icterus.    Pupils: Pupils are equal, round, and reactive to light.  Cardiovascular:     Rate and Rhythm: Normal rate and regular rhythm.  Pulmonary:     Effort: Pulmonary effort is normal. No respiratory distress.     Breath sounds: No wheezing.  Abdominal:     General: Bowel sounds are normal. There is no distension.     Palpations: Abdomen is soft. There is no mass.     Tenderness: There is no abdominal tenderness.  Musculoskeletal:        General: Normal range of motion.     Cervical back: Normal range of motion and neck supple.  Skin:    Findings: No erythema or rash.  Neurological:     Mental Status: She is alert and oriented to person, place, and time. Mental status is at baseline.     Cranial Nerves: No cranial nerve deficit.     Coordination: Coordination normal.  Psychiatric:        Mood and Affect: Mood  normal.     LABORATORY DATA:  I have reviewed the data as listed Lab Results  Component Value Date   WBC 7.9 03/24/2024   HGB 15.5 (H) 03/24/2024   HCT 45.3 03/24/2024   MCV 94.0 03/24/2024   PLT 257 03/24/2024   Recent Labs    03/26/23 1321 09/24/23 1320 03/24/24 1331  NA 133* 130* 134*  K 3.9 3.5 4.0  CL 99 96* 98  CO2 23 23 25   GLUCOSE 173* 164* 115*  BUN 7 10 9   CREATININE 0.73 0.63 0.66  CALCIUM 9.4 9.0 10.0  GFRNONAA >60 >60 >60  PROT 7.6 7.4 7.2  ALBUMIN 4.2 4.1 4.4  AST 23 19 21   ALT 15 19 14   ALKPHOS 70 74 85  BILITOT 0.5 0.8 0.3     RADIOGRAPHIC STUDIES: I have personally reviewed the radiological images as listed and agreed with the findings in the report. No results found.

## 2024-03-24 NOTE — Assessment & Plan Note (Addendum)
 Zometa  every 6 months-proceed with Zometa  today..   Repeat DEXA showed improved, currently osteopenic T score -2.3 Proceed with Zometa   Patient Is not interested in continue Zometa  after finishing AI.  Recommend patient to follow up with PCP and consider repeat DEXA in the future

## 2024-03-24 NOTE — Patient Instructions (Signed)

## 2024-03-29 LAB — BCR-ABL1 FISH
Cells Analyzed: 200
Cells Counted: 200

## 2024-04-12 LAB — CALR +MPL + E12-E15  (REFLEX)

## 2024-04-12 LAB — JAK2 V617F RFX CALR/MPL/E12-15

## 2024-07-16 ENCOUNTER — Ambulatory Visit: Admit: 2024-07-16 | Admitting: Gastroenterology

## 2024-07-16 SURGERY — COLONOSCOPY
Anesthesia: Choice

## 2024-08-18 ENCOUNTER — Ambulatory Visit: Admitting: Student
